# Patient Record
Sex: Female | Born: 1960
Health system: Southern US, Community
[De-identification: ages and names within clinical notes are randomized; demographics above are authoritative.]

## PROBLEM LIST (undated history)

## (undated) DIAGNOSIS — I447 Left bundle-branch block, unspecified: Secondary | ICD-10-CM

## (undated) DIAGNOSIS — M778 Other enthesopathies, not elsewhere classified: Secondary | ICD-10-CM

## (undated) DIAGNOSIS — G4730 Sleep apnea, unspecified: Secondary | ICD-10-CM

## (undated) DIAGNOSIS — G4733 Obstructive sleep apnea (adult) (pediatric): Secondary | ICD-10-CM

## (undated) DIAGNOSIS — M1990 Unspecified osteoarthritis, unspecified site: Secondary | ICD-10-CM

## (undated) DIAGNOSIS — J189 Pneumonia, unspecified organism: Secondary | ICD-10-CM

## (undated) DIAGNOSIS — K5792 Diverticulitis of intestine, part unspecified, without perforation or abscess without bleeding: Secondary | ICD-10-CM

## (undated) DIAGNOSIS — K449 Diaphragmatic hernia without obstruction or gangrene: Secondary | ICD-10-CM

## (undated) DIAGNOSIS — R053 Chronic cough: Secondary | ICD-10-CM

## (undated) DIAGNOSIS — R569 Unspecified convulsions: Secondary | ICD-10-CM

## (undated) DIAGNOSIS — F5104 Psychophysiologic insomnia: Secondary | ICD-10-CM

## (undated) DIAGNOSIS — Z950 Presence of cardiac pacemaker: Secondary | ICD-10-CM

## (undated) DIAGNOSIS — F410 Panic disorder [episodic paroxysmal anxiety] without agoraphobia: Secondary | ICD-10-CM

## (undated) DIAGNOSIS — M109 Gout, unspecified: Secondary | ICD-10-CM

## (undated) DIAGNOSIS — K219 Gastro-esophageal reflux disease without esophagitis: Secondary | ICD-10-CM

## (undated) DIAGNOSIS — I509 Heart failure, unspecified: Secondary | ICD-10-CM

## (undated) DIAGNOSIS — I10 Essential (primary) hypertension: Secondary | ICD-10-CM

## (undated) DIAGNOSIS — Z9989 Dependence on other enabling machines and devices: Secondary | ICD-10-CM

## (undated) HISTORY — PX: SKIN CANCER EXCISION: SHX779

## (undated) HISTORY — DX: Presence of cardiac pacemaker: Z95.0

## (undated) HISTORY — PX: WISDOM TOOTH EXTRACTION: SHX21

## (undated) HISTORY — PX: POLYPECTOMY: SHX149

## (undated) HISTORY — PX: UPPER GI ENDOSCOPY: SHX6162

## (undated) HISTORY — PX: TUBAL LIGATION: SHX77

## (undated) HISTORY — DX: Other enthesopathies, not elsewhere classified: M77.8

## (undated) HISTORY — DX: Obstructive sleep apnea (adult) (pediatric): G47.33

## (undated) HISTORY — PX: COLONOSCOPY: SHX174

## (undated) HISTORY — DX: Sleep apnea, unspecified: G47.30

## (undated) HISTORY — DX: Psychophysiologic insomnia: F51.04

## (undated) HISTORY — DX: Dependence on other enabling machines and devices: Z99.89

## (undated) HISTORY — PX: HAND SURGERY: SHX662

## (undated) HISTORY — DX: Panic disorder (episodic paroxysmal anxiety): F41.0

## (undated) NOTE — *Deleted (*Deleted)
  Physical Exam  BP (!) 182/89   Pulse 93   Temp 97.7 F (36.5 C) (Oral)   Resp 16   LMP  (LMP Unknown)   SpO2 97%   Physical Exam  ED Course/Procedures   Clinical Course as of Mar 24 2026  Sun Mar 24, 2020  1717 WBC(!): 21.1 [MV]  1836 Discussed case with ED Pharmacist Scott who recommends 7 days of Bactrim DS BID given pt's allergies   [MV]  1920 Lactic Acid, Venous(!!): 2.1 [MV]    Clinical Course User Index [MV] Tanda Rockers, PA-C    Procedures  MDM  ***   Repeat lactic  Tolerating PO DIscharge    Vitals:   03/24/20 1439 03/24/20 1822 03/24/20 1900 03/24/20 1954  BP: (!) 190/110 (!) 203/99 (!) 206/109 (!) 182/89  Pulse: 84 (!) 103 (!) 101 93  Resp: 16 16    Temp: 97.7 F (36.5 C)     TempSrc: Oral     SpO2: 97% 97% 94% 97%

---

## 1997-11-07 ENCOUNTER — Other Ambulatory Visit: Admission: RE | Admit: 1997-11-07 | Discharge: 1997-11-07 | Payer: Self-pay | Admitting: Obstetrics and Gynecology

## 1998-02-02 ENCOUNTER — Emergency Department (HOSPITAL_COMMUNITY): Admission: EM | Admit: 1998-02-02 | Discharge: 1998-02-02 | Payer: Self-pay | Admitting: Emergency Medicine

## 1998-02-07 ENCOUNTER — Other Ambulatory Visit: Admission: RE | Admit: 1998-02-07 | Discharge: 1998-02-07 | Payer: Self-pay | Admitting: Internal Medicine

## 1998-02-08 ENCOUNTER — Encounter: Payer: Self-pay | Admitting: Obstetrics & Gynecology

## 1998-02-08 ENCOUNTER — Inpatient Hospital Stay (HOSPITAL_COMMUNITY): Admission: EM | Admit: 1998-02-08 | Discharge: 1998-02-10 | Payer: Self-pay | Admitting: Emergency Medicine

## 1998-02-12 ENCOUNTER — Encounter: Payer: Self-pay | Admitting: Internal Medicine

## 1998-02-12 ENCOUNTER — Inpatient Hospital Stay (HOSPITAL_COMMUNITY): Admission: EM | Admit: 1998-02-12 | Discharge: 1998-02-15 | Payer: Self-pay | Admitting: Emergency Medicine

## 1998-02-14 ENCOUNTER — Encounter: Payer: Self-pay | Admitting: Surgery

## 1998-11-19 ENCOUNTER — Other Ambulatory Visit: Admission: RE | Admit: 1998-11-19 | Discharge: 1998-11-19 | Payer: Self-pay | Admitting: Obstetrics and Gynecology

## 1999-11-20 ENCOUNTER — Other Ambulatory Visit: Admission: RE | Admit: 1999-11-20 | Discharge: 1999-11-20 | Payer: Self-pay | Admitting: Obstetrics and Gynecology

## 2003-11-09 ENCOUNTER — Other Ambulatory Visit: Admission: RE | Admit: 2003-11-09 | Discharge: 2003-11-09 | Payer: Self-pay | Admitting: Obstetrics and Gynecology

## 2005-02-19 ENCOUNTER — Other Ambulatory Visit: Admission: RE | Admit: 2005-02-19 | Discharge: 2005-02-19 | Payer: Self-pay | Admitting: Obstetrics and Gynecology

## 2006-05-24 ENCOUNTER — Ambulatory Visit: Payer: Self-pay | Admitting: Cardiology

## 2006-06-04 ENCOUNTER — Ambulatory Visit: Payer: Self-pay

## 2006-06-04 ENCOUNTER — Ambulatory Visit: Payer: Self-pay | Admitting: Cardiology

## 2006-06-22 ENCOUNTER — Ambulatory Visit: Payer: Self-pay | Admitting: Cardiology

## 2006-06-24 ENCOUNTER — Ambulatory Visit: Payer: Self-pay | Admitting: Cardiology

## 2006-07-30 ENCOUNTER — Ambulatory Visit: Payer: Self-pay | Admitting: Cardiology

## 2012-01-20 DIAGNOSIS — K219 Gastro-esophageal reflux disease without esophagitis: Secondary | ICD-10-CM | POA: Insufficient documentation

## 2012-01-20 DIAGNOSIS — M25519 Pain in unspecified shoulder: Secondary | ICD-10-CM | POA: Insufficient documentation

## 2012-01-20 DIAGNOSIS — F17200 Nicotine dependence, unspecified, uncomplicated: Secondary | ICD-10-CM | POA: Insufficient documentation

## 2012-01-20 DIAGNOSIS — R569 Unspecified convulsions: Secondary | ICD-10-CM | POA: Insufficient documentation

## 2012-01-20 DIAGNOSIS — Z8719 Personal history of other diseases of the digestive system: Secondary | ICD-10-CM | POA: Insufficient documentation

## 2012-01-20 DIAGNOSIS — R55 Syncope and collapse: Secondary | ICD-10-CM | POA: Insufficient documentation

## 2012-01-20 DIAGNOSIS — M79609 Pain in unspecified limb: Secondary | ICD-10-CM | POA: Insufficient documentation

## 2012-01-21 ENCOUNTER — Emergency Department (HOSPITAL_COMMUNITY): Payer: Self-pay

## 2012-01-21 ENCOUNTER — Emergency Department (HOSPITAL_COMMUNITY)
Admission: EM | Admit: 2012-01-21 | Discharge: 2012-01-21 | Disposition: A | Discharge status: Home / Self Care | Payer: Self-pay | Attending: Emergency Medicine | Admitting: Emergency Medicine

## 2012-01-21 ENCOUNTER — Encounter (HOSPITAL_COMMUNITY): Payer: Self-pay | Admitting: Emergency Medicine

## 2012-01-21 DIAGNOSIS — I951 Orthostatic hypotension: Secondary | ICD-10-CM

## 2012-01-21 HISTORY — DX: Diaphragmatic hernia without obstruction or gangrene: K44.9

## 2012-01-21 HISTORY — DX: Diverticulitis of intestine, part unspecified, without perforation or abscess without bleeding: K57.92

## 2012-01-21 HISTORY — DX: Unspecified convulsions: R56.9

## 2012-01-21 HISTORY — DX: Gastro-esophageal reflux disease without esophagitis: K21.9

## 2012-01-21 LAB — BASIC METABOLIC PANEL
BUN: 10 mg/dL (ref 6–23)
CO2: 25 mEq/L (ref 19–32)
Glucose, Bld: 101 mg/dL — ABNORMAL HIGH (ref 70–99)
Potassium: 4 mEq/L (ref 3.5–5.1)
Sodium: 138 mEq/L (ref 135–145)

## 2012-01-21 LAB — CBC
HCT: 46.3 % — ABNORMAL HIGH (ref 36.0–46.0)
Hemoglobin: 16.2 g/dL — ABNORMAL HIGH (ref 12.0–15.0)
RBC: 5.22 MIL/uL — ABNORMAL HIGH (ref 3.87–5.11)

## 2012-01-21 NOTE — ED Provider Notes (Signed)
History     CSN: [id number]  Arrival date & time 01/20/12  2347   First MD Initiated Contact with Patient 01/21/12 0206      Chief Complaint  Patient presents with  . Seizures     HPI The patient reports that since 2008 she's had intermittent episodes where she becomes lightheaded and will sometimes have a syncopal episode followed by what sounds like myoclonic jerks.  There is concern by family that she may be having seizures.  Into does make patient a large workup by both neurology and cardiology that included tilt table testing EEGs and MRIs.  There is no known cause at this time.  She went from 2000 01/15/2010 before she had another episode.  And so far in 2013 she she's had about 4 episodes in the past month and half.  Description of the event a week and a half ago sounds to be that the patient was staring and appeared to be having an absence seizure.  The patient remembers hearing her sister talk to her but she states she was unable to move.  There is no loss consciousness at that time.  The episode today that brought the patient in today is described as the patient standing near a counter and then stating "oh shit" clear handout on the table and then falling over with twitching of her arms and legs that lasted about 1 minute.  There is no postictal period as the patient regained normal consciousness.  There is no tongue biting or urinary incontinence.  She has no headache at this time.  She denies weakness of her upper lower extremities.  She's had no chest pain shortness of breath.  She denies fevers or chills.  She said no abdominal pain nausea or vomiting.  She does not have a primary care physician and does not have insurance.  She's never been on anti-seizure medicine.    Past Medical History  Diagnosis Date  . Seizures   . Diverticulitis   . GERD (gastroesophageal reflux disease)   . Hiatal hernia     Past Surgical History  Procedure Date  . Tubal ligation   . Hand surgery       No family history on file.  History  Substance Use Topics  . Smoking status: Current Everyday Smoker  . Smokeless tobacco: Not on file  . Alcohol Use: No    OB History    Grav Para Term Preterm Abortions TAB SAB Ect Mult Living                  Review of Systems  All other systems reviewed and are negative.    Allergies  Aspirin; Ciprofloxacin; Codeine; and Penicillins  Home Medications   Current Outpatient Rx  Name Route Sig Dispense Refill  . ADULT MULTIVITAMIN W/MINERALS CH Oral Take 1 tablet by mouth daily.    Marland Kitchen OMEPRAZOLE 20 MG PO CPDR Oral Take 20 mg by mouth at bedtime.    Marland Kitchen OVER THE COUNTER MEDICATION Oral Take 1 tablet by mouth daily. OTC. Hot flash medication. Patient unsure of the name.      BP 135/77  Pulse 110  Temp 98.4 F (36.9 C) (Oral)  Resp 16  SpO2 97%  Physical Exam  Nursing note and vitals reviewed. Constitutional: She is oriented to person, place, and time. She appears well-developed and well-nourished. No distress.  HENT:  Head: Normocephalic and atraumatic.  Eyes: EOM are normal. Pupils are equal, round, and reactive to light.  Neck: Normal range of motion.  Cardiovascular: Normal rate, regular rhythm and normal heart sounds.   Pulmonary/Chest: Effort normal and breath sounds normal.  Abdominal: Soft. She exhibits no distension. There is no tenderness.  Musculoskeletal: Normal range of motion.       Mild tenderness of left great toe without swelling or ecchymosis.  Mild tenderness of right lateral shoulder without deformity or ecchymosis.  Neurological: She is alert and oriented to person, place, and time.       5/5 strength in major muscle groups of  bilateral upper and lower extremities. Speech normal. No facial asymetry.  Gait normal. Walked in the hall  Skin: Skin is warm and dry.  Psychiatric: She has a normal mood and affect. Judgment normal.    ED Course  Procedures (including critical care time)  Labs Reviewed  CBC -  Abnormal; Notable for the following:    RBC 5.22 (*)     Hemoglobin 16.2 (*)     HCT 46.3 (*)     All other components within normal limits  BASIC METABOLIC PANEL - Abnormal; Notable for the following:    Glucose, Bld 101 (*)     All other components within normal limits   Dg Shoulder Right  01/21/2012  *RADIOLOGY REPORT*  Clinical Data: Seizure.  Fall.  Shoulder pain.  RIGHT SHOULDER - 2+ VIEW  Comparison: None.  Findings: The scapular Y view is oblique.  The humeral head does appear located over the glenoid without convincing evidence of posterior dislocation.  Internal and external rotation views appear normal.  Visualized right chest normal.  AC joint normal.  IMPRESSION: No acute osseous abnormality.  Oblique scapular Y view.  Original Report Authenticated By: Andreas Newport, M.D.   Ct Head Wo Contrast  01/21/2012  *RADIOLOGY REPORT*  Clinical Data: Weakness seizure with fall.  Head trauma.  CT HEAD WITHOUT CONTRAST  Technique:  Contiguous axial images were obtained from the base of the skull through the vertex without contrast.  Comparison: None.  Findings: No mass lesion, mass effect, midline shift, hydrocephalus, hemorrhage.  No territorial ischemia or acute infarction.  Paranasal sinuses appear within normal limits.  IMPRESSION: Negative CT head.  Original Report Authenticated By: Andreas Newport, M.D.   Dg Foot Complete Left  01/21/2012  *RADIOLOGY REPORT*  Clinical Data: Seizure.  Great toe pain.  LEFT FOOT - COMPLETE 3+ VIEW  Comparison: None.  Findings: Anatomic alignment.  There is no displaced fracture identified.  Mild first MTP joint osteoarthritis. Small calcaneal spurs incidentally noted.  IMPRESSION: No acute osseous abnormality.  Original Report Authenticated By: Andreas Newport, M.D.     1. Orthostatic syncope       MDM  The patient's symptoms sound more like she develops near syncope orthostasis and then has myoclonic jerks when she was on the ground.  This is been  recurrent for years and she has had cardiac neurologic and tilt table testing without a definitive cause.  I don't believe this to be seizures.  She's had no tongue biting or urinary incontinence.  She has no postictal period she looks well the emergency department.  Labs CT head are normal.  She ambulates in the hall without any difficulty.  Discharge home.  She'll need a primary care physician.  I don't think that she needs cardiac or neurology subspecialty evaluation at this time given her extensive workup in the past.        Lyanne Co, MD 01/21/12 [phone]

## 2012-01-21 NOTE — ED Notes (Addendum)
Pt presents with witnessed seizure @ 2330 pt fell form chair at desk onto floor with episode of shaking. Denies tongue trauma, denies urinary incontinence. Pt A & O in triage. Pt c/o pain to R shoulder and L great toe, pt not currently taking anti-seizure medication

## 2013-01-24 ENCOUNTER — Telehealth: Payer: Self-pay | Admitting: Cardiology

## 2013-01-24 NOTE — Telephone Encounter (Signed)
Request Rec From [employer] asking For Records on this Pt. Her Chart is currently in the Warehouse, I sent Cover sheet back along w/ Release letting them know the  Status of the Chart & also if the needed chart to Respond back to Me or call. 01/24/13/KM

## 2013-08-22 ENCOUNTER — Other Ambulatory Visit: Payer: Self-pay | Admitting: Family Medicine

## 2013-08-22 DIAGNOSIS — G40909 Epilepsy, unspecified, not intractable, without status epilepticus: Secondary | ICD-10-CM

## 2013-08-24 ENCOUNTER — Ambulatory Visit
Admission: RE | Admit: 2013-08-24 | Discharge: 2013-08-24 | Disposition: A | Discharge status: Home / Self Care | Payer: BC Managed Care – PPO | Source: Ambulatory Visit | Attending: Family Medicine | Admitting: Family Medicine

## 2013-08-24 DIAGNOSIS — G40909 Epilepsy, unspecified, not intractable, without status epilepticus: Secondary | ICD-10-CM

## 2013-08-24 MED ORDER — GADOBENATE DIMEGLUMINE 529 MG/ML IV SOLN
17.0000 mL | Freq: Once | INTRAVENOUS | Status: AC | PRN
  Administered 2013-08-24: 17 mL via INTRAVENOUS

## 2013-08-25 ENCOUNTER — Encounter: Payer: Self-pay | Admitting: Neurology

## 2013-08-28 ENCOUNTER — Ambulatory Visit (INDEPENDENT_AMBULATORY_CARE_PROVIDER_SITE_OTHER): Payer: BC Managed Care – PPO | Admitting: Neurology

## 2013-08-28 ENCOUNTER — Encounter: Payer: Self-pay | Admitting: Neurology

## 2013-08-28 VITALS — BP 134/77 | HR 92 | Ht 62.25 in | Wt 180.0 lb

## 2013-08-28 DIAGNOSIS — R4182 Altered mental status, unspecified: Secondary | ICD-10-CM

## 2013-08-28 NOTE — Patient Instructions (Signed)
Overall you are doing fairly well but I do want to suggest a few things today:   Remember to drink plenty of fluid, eat healthy meals and do not skip any meals. Try to eat protein with a every meal and eat a healthy snack such as fruit or nuts in between meals. Try to keep a regular sleep-wake schedule and try to exercise daily, particularly in the form of walking, 20-30 minutes a day, if you can.   As far as your medications are concerned, I would like to suggest you continue on the keppra twice a day as prescribed  As far as diagnostic testing:  1)Please schedule an EEG when you check out  I would like to see you back in 6 months.  Please call us with any interim questions, concerns, problems, updates or refill requests.   My clinical assistant and will answer any of your questions and relay your messages to me and also relay most of my messages to you.   Our phone number is [phone]. We also have an after hours call service for urgent matters and there is a physician on-call for urgent questions. For any emergencies you know to call 911 or go to the nearest emergency room

## 2013-08-28 NOTE — Progress Notes (Signed)
[facility]    Provider:  Dr Janann Colonel Referring Provider: No ref. provider found Primary Care Physician:  Milagros Evener, MD  CC:  Seizure disorder  HPI:  [name] [name] is a 53 y.o. female here as a referral from Dr. Zadie Rhine for question of seizure  Patient recalls getting into the car around 1 week ago, felt "strange", felt like she was going to pass out, no abnormal sounds or smells. Felt shaking all over, her sister noted shaking of all 4 extremities. Patient notes that she was awake during the whole episode, could hear her sister talking, recalls shaking all over, was unable to talk though. Lasted around 1 minute, quickly snapped out of it and was back to her normal self immediately. No tongue biting, no loss of bowel/bladder. Has had episodes similar to this in the past. Has had extensive cardiac and neurologic workup in the past, has been told everything was normal. She does report a lot of stress related to taking care of her mother with late state AD.   Per PCP notes: Patient was sitting in her car, noticed her right foot began tapping, hands started to move, grabbed hold of the door. Patient recalls her sister calling her and was aware of what was happening but unable to answer. Denies any flashing lights, or vision changes. Lasted around 1 minute, then patient stated, "I am ok". Had 2 similar episodes last year. Patient is not driving. Past history pertinent for seizure vs syncope spells since 2008. Evaluated by neurologist in 2008, had EEG, told nothing was wrong. Recent mri brain done showed the following: 1. No acute intracranial abnormality.  2. 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 3. Nonspecific cerebral white changes.   Review of Systems: Out of a complete 14 system review, the patient complains of only the following symptoms, and all  other reviewed systems are negative. + dizziness, seizures  History   Social History  . Marital Status: Single    Spouse Name: N/A    Number of Children: N/A  . Years of Education: N/A   Occupational History  . Not on file.   Social History Main Topics  . Smoking status: Current Every Day Smoker  . Smokeless tobacco: Not on file  . Alcohol Use: No     Comment: Quit 12/06/2009  . Drug Use: No  . Sexual Activity: Not on file   Other Topics Concern  . Not on file   Social History Narrative   2-3 sodas daily   Lives with sister   Some college   Left handed    No family history on file.  Past Medical History  Diagnosis Date  . Seizures   . Diverticulitis   . GERD (gastroesophageal reflux disease)   . Hiatal hernia     Past Surgical History  Procedure Laterality Date  . Tubal ligation    . Hand surgery      Current Outpatient Prescriptions  Medication Sig Dispense Refill  . levETIRAcetam (KEPPRA) 500 MG tablet       . Multiple Vitamin (MULTIVITAMIN WITH MINERALS) TABS Take 1 tablet by mouth daily.      Marland Kitchen omeprazole (PRILOSEC) 20 MG capsule Take 20 mg by mouth at bedtime.      Marland Kitchen OVER THE COUNTER MEDICATION Take 1 tablet by mouth daily. OTC. Hot flash medication. Patient unsure of the name.  No current facility-administered medications for this visit.    Allergies as of 08/28/2013 - Review Complete 08/28/2013  Allergen Reaction Noted  . Aspirin  01/21/2012  . Ciprofloxacin  01/21/2012  . Codeine Hives 01/21/2012  . Penicillins Hives 01/21/2012    Vitals: BP 134/77  Pulse 92  Ht 5' 2.25" (1.581 m)  Wt 180 lb (81.647 kg)  BMI 32.66 kg/m2 Last Weight:  Wt Readings from Last 1 Encounters:  08/28/13 180 lb (81.647 kg)   Last Height:   Ht Readings from Last 1 Encounters:  08/28/13 5' 2.25" (1.581 m)     Physical exam: Exam: Gen: NAD, conversant Eyes: anicteric sclerae, moist conjunctivae HENT: Atraumatic, oropharynx clear Neck: Trachea  midline; supple,  Lungs: CTA, no wheezing, rales, rhonic                          CV: RRR, no MRG Abdomen: Soft, non-tender;  Extremities: No peripheral edema  Skin: Normal temperature, no rash,  Psych: Appropriate affect, pleasant  Neuro: MS: AA&Ox3, appropriately interactive, normal affect   Speech: fluent w/o paraphasic error  Memory: good recent and remote recall  CN: PERRL, EOMI no nystagmus, no ptosis, sensation intact to LT V1-V3 bilat, face symmetric, no weakness, hearing grossly intact, palate elevates symmetrically, shoulder shrug 5/5 bilat,  tongue protrudes midline, no fasiculations noted.  Motor: normal bulk and tone Strength: 5/5  In all extremities  Coord: rapid alternating and point-to-point (FNF, HTS) movements intact.  Reflexes: symmetrical, bilat downgoing toes  Sens: LT intact in all extremities  Gait: posture, stance, stride and arm-swing normal. Tandem gait intact. Able to walk on heels and toes. Romberg absent.   Assessment:  After physical and neurologic examination, review of laboratory studies, imaging, neurophysiology testing and pre-existing records, assessment will be reviewed on the problem list.  Plan:  Treatment plan and additional workup will be reviewed under Problem List.  1)Altered mental status  53y/o woman presenting for initial evaluation of periods of altered mental status with question of seizure activity. Patient notes generalized shaking for around 1 minute with quick return to baseline, of note she describes being alert and awake for the whole episode but unable to talk. Due to generalized shaking with no LOC these events would be atypical for seizures but the differential would include seizure vs syncope. Non-epileptic seizure would also be on the differential. MRI did show ? Right mesial temporal changes which could be potential seizure foci. Will check EEG, continue on keppra 500mg  BID. Counseled patient to avoid driving for 6  months, will follow up in 6 months. If EEG unremarkable patient is cleared to return to work with limitation of no driving.    Jim Like, DO  [facility] [address] [place], [place] [address]  Phone [phone] Fax [phone]

## 2013-08-30 ENCOUNTER — Ambulatory Visit (INDEPENDENT_AMBULATORY_CARE_PROVIDER_SITE_OTHER): Payer: BC Managed Care – PPO | Admitting: Radiology

## 2013-08-30 ENCOUNTER — Telehealth: Payer: Self-pay | Admitting: Neurology

## 2013-08-30 DIAGNOSIS — R4182 Altered mental status, unspecified: Secondary | ICD-10-CM

## 2013-08-30 NOTE — Telephone Encounter (Signed)
Patient at check out today states that she will need a letter from [name] releasing her to go back to work, please call and advise patient.

## 2013-08-30 NOTE — Telephone Encounter (Signed)
Please let her know we are waiting for the EEG results. Thanks.

## 2013-08-30 NOTE — Procedures (Signed)
      Jahayra Mazo is a 53 year old patient with episodes of altered mental status associated with trembling, feeling strange. The patient has not blacked out and has some recollection of what goes on during the events. The patient is being evaluated for possible seizures.  This is a routine EEG. No skull defects are noted. Medications include Keppra, multivitamins, and Prilosec.  EEG classification: Dysrhythmia grade 1 left temporal  Description of the recording: The background rhythms of this recording consists of a moderately well modulated medium amplitude rhythm of 11 Hz that is reactive to eye opening and closure. As the record progresses, photic stimulation is performed, and results in a bilateral and symmetric photic driving response. Hyperventilation is also performed, resulting in a minimal buildup of the background rhythm activities without significant slowing seen. Intermittently during the recording, mild brief 6-7 Hz theta frequency slowing is seen emanating from the left temporal region. Similar slowing is not seen on the right side. At no time during the recording does there appear to be evidence of actual spike or spike or discharges. EKG monitor shows no evidence of cardiac rhythm abnormalities with a heart rate of 72.  Impression: This is a borderline EEG recording secondary to brief episodes of theta slowing emanating from the left temporal region. This study suggests a mild left brain abnormality, without clear evidence of epileptiform activity. Clinical correlation is required. No electrographic seizures were recorded.

## 2013-08-30 NOTE — Telephone Encounter (Signed)
Spoke with patient and informed her that once we receive the results of her EEG, [name] will review them and decide if she will be released to go back to work. Patient expressed understanding no further questions or concerns.

## 2013-08-30 NOTE — Telephone Encounter (Signed)
Pt stating that she is going to need a letter releasing her to go back to work. Please advise

## 2013-08-31 ENCOUNTER — Encounter: Payer: Self-pay | Admitting: Neurology

## 2013-08-31 NOTE — Progress Notes (Signed)
Quick Note:  Spoke with patient, she will pick the letter up tomorrow, patient states that her Keppra medication states no refills and that if she is going to be on this medication for a while that she needs more refills, her pharmacy is [facility] on [address]. ______

## 2013-09-01 ENCOUNTER — Other Ambulatory Visit: Payer: Self-pay | Admitting: Neurology

## 2013-09-01 MED ORDER — LEVETIRACETAM 500 MG PO TABS: 500.0000 mg | ORAL_TABLET | Freq: Two times a day (BID) | ORAL | Status: DC

## 2013-09-19 ENCOUNTER — Telehealth: Payer: Self-pay | Admitting: Neurology

## 2013-09-19 NOTE — Telephone Encounter (Signed)
Left message for patient that the doctor feels the patient may work, and does not need FMLA time.  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  I asked her to call office and get reimbursed her fee.

## 2013-10-20 ENCOUNTER — Telehealth: Payer: Self-pay | Admitting: *Deleted

## 2013-10-20 NOTE — Telephone Encounter (Signed)
Patient returning your call about FMLA

## 2013-10-23 ENCOUNTER — Telehealth: Payer: Self-pay | Admitting: *Deleted

## 2013-10-23 ENCOUNTER — Telehealth: Payer: Self-pay | Admitting: Neurology

## 2013-10-23 NOTE — Telephone Encounter (Signed)
I didn't call the patient about an FMLA form, and do not have one for the patient.  Please check into this.

## 2013-10-23 NOTE — Telephone Encounter (Signed)
Need FMLA form to be filled out,because she will lose her job if it's nodone

## 2013-10-23 NOTE — Telephone Encounter (Signed)
Patient returning Donna's call, will be at number listed until 5pm today.

## 2013-10-24 NOTE — Telephone Encounter (Signed)
Spoke to patient and she relayed that her company requires FMLA to be completed if an employee misses more than 3 days of work.  This was not originally communicated to me.  I told her I would complete the form for the days she was out of work March 17-27th and relay this information to the doctor, so he will approve.

## 2013-10-24 NOTE — Telephone Encounter (Signed)
Thanks for taking care of this.

## 2013-10-26 NOTE — Telephone Encounter (Signed)
Form completed and faxed to [employer] today, confirmation received.  Spoke to patient to relay form was completed and faxed.

## 2013-10-31 DIAGNOSIS — Z0289 Encounter for other administrative examinations: Secondary | ICD-10-CM

## 2013-11-02 ENCOUNTER — Telehealth: Payer: Self-pay | Admitting: Neurology

## 2013-11-02 NOTE — Telephone Encounter (Signed)
Patient requesting a return call from Byron.

## 2013-11-03 NOTE — Telephone Encounter (Signed)
Called yesterday and didn't get a returned call.  Had a seizure the day before yesterday.   Made her sleep most of the day.   Call her at [phone]

## 2013-11-06 ENCOUNTER — Other Ambulatory Visit: Payer: Self-pay | Admitting: Neurology

## 2013-11-06 MED ORDER — LEVETIRACETAM 750 MG PO TABS: 750.0000 mg | ORAL_TABLET | Freq: Two times a day (BID) | ORAL | Status: DC

## 2013-11-06 NOTE — Telephone Encounter (Signed)
Spoke with patient informed her to increase the Keppra to 750 mg twice a day, patient states that she just needed documentation stating that when she has a seizure that it will make her very lethargic but she will be fine the next day.

## 2013-11-06 NOTE — Telephone Encounter (Signed)
Please let her know we will increase her Keppra to 750mg  twice a day. We will plan to work with her FMLA paperwork to see if we can get her days off through that. Butch Penny is working on this).

## 2013-11-06 NOTE — Telephone Encounter (Signed)
Spoke to patient and she had a seizure on Thursday morning, she laid back down and slept the rest of the day.  She returned to work on Friday and felt fine.  Would you like to see patient sooner than September appointment.  Also patient is needing a note for being out of work for Thursday.

## 2013-11-09 ENCOUNTER — Telehealth: Payer: Self-pay | Admitting: Neurology

## 2013-11-09 NOTE — Telephone Encounter (Signed)
Spoke to patient and asked her to contact her HR department to see if we can addend the FMLA form instead of writing a note every time she is out of work for a seizure event.  They have agreed to our office changing the FMLA, to intermittent family leave.

## 2013-11-09 NOTE — Telephone Encounter (Signed)
Patient wanting [name] [name] to know it's OK to Admend FMLA paperwork.

## 2013-11-09 NOTE — Telephone Encounter (Signed)
Patient's HR department has agreed for [place] to addend FMLA to accommodate patient's seizure events.

## 2013-12-06 ENCOUNTER — Encounter (HOSPITAL_BASED_OUTPATIENT_CLINIC_OR_DEPARTMENT_OTHER): Payer: Self-pay | Admitting: Emergency Medicine

## 2013-12-06 ENCOUNTER — Emergency Department (HOSPITAL_BASED_OUTPATIENT_CLINIC_OR_DEPARTMENT_OTHER)
Admission: EM | Admit: 2013-12-06 | Discharge: 2013-12-06 | Disposition: A | Discharge status: Home / Self Care | Payer: BC Managed Care – PPO | Attending: Emergency Medicine | Admitting: Emergency Medicine

## 2013-12-06 ENCOUNTER — Telehealth: Payer: Self-pay | Admitting: *Deleted

## 2013-12-06 ENCOUNTER — Other Ambulatory Visit: Payer: Self-pay | Admitting: Neurology

## 2013-12-06 DIAGNOSIS — G40909 Epilepsy, unspecified, not intractable, without status epilepticus: Secondary | ICD-10-CM | POA: Insufficient documentation

## 2013-12-06 DIAGNOSIS — Z880 Allergy status to penicillin: Secondary | ICD-10-CM | POA: Insufficient documentation

## 2013-12-06 DIAGNOSIS — F17200 Nicotine dependence, unspecified, uncomplicated: Secondary | ICD-10-CM | POA: Insufficient documentation

## 2013-12-06 DIAGNOSIS — Z8719 Personal history of other diseases of the digestive system: Secondary | ICD-10-CM | POA: Insufficient documentation

## 2013-12-06 DIAGNOSIS — Z79899 Other long term (current) drug therapy: Secondary | ICD-10-CM | POA: Insufficient documentation

## 2013-12-06 DIAGNOSIS — R569 Unspecified convulsions: Secondary | ICD-10-CM

## 2013-12-06 MED ORDER — LEVETIRACETAM 1000 MG PO TABS: 1000.0000 mg | ORAL_TABLET | Freq: Two times a day (BID) | ORAL | Status: DC

## 2013-12-06 MED ORDER — ONDANSETRON 4 MG PO TBDP: 4.0000 mg | ORAL_TABLET | Freq: Three times a day (TID) | ORAL | Status: DC | PRN

## 2013-12-06 MED ORDER — SODIUM CHLORIDE 0.9 % IV SOLN: 1000.0000 mg | Freq: Once | INTRAVENOUS | Status: DC

## 2013-12-06 MED ORDER — LEVETIRACETAM 500 MG/5ML IV SOLN
INTRAVENOUS | Status: AC
  Administered 2013-12-06: 1000 mg via INTRAVENOUS
  Filled 2013-12-06: qty 10

## 2013-12-06 NOTE — ED Notes (Signed)
MD at bedside. 

## 2013-12-06 NOTE — Telephone Encounter (Signed)
Spoke with patient and she has not gone to ER, was nauseated for a couple of days, was prescribed Promethazine by PCP, (did not take for the last 4 days), mother passed away and was under a lot of stress. She has also been taking 3 antibiotics(Z-pak,Prednisdone,Sulfur) for 3 weeks. She was has not missed any doses of her Keppra. Was lying down, woke up and had a seizure.

## 2013-12-06 NOTE — Telephone Encounter (Signed)
Hi [name] [name],  Can you pleaser call and check up on her. If she has not returned to her baseline then she needs to go to the ER. If she is back to normal then let her know to increase her keppra to 1000mg  twice a day.  A prescription has been sent to her pharmacy.   Thanks.

## 2013-12-06 NOTE — ED Notes (Signed)
Pt reports unwitnessed seizure this am, HX of same, Neuro called this am and increased keppra to 1000mg  x bid.

## 2013-12-06 NOTE — Discharge Instructions (Signed)
Start your 1000 mg twice a day dose of Keppra  Seizure, Adult A seizure is abnormal electrical activity in the brain. Seizures usually last from 30 seconds to 2 minutes. There are various types of seizures. Before a seizure, you may have a warning sensation (aura) that a seizure is about to occur. An aura may include the following symptoms:   Fear or anxiety.  Nausea.  Feeling like the room is spinning (vertigo).  Vision changes, such as seeing flashing lights or spots. Common symptoms during a seizure include:  A change in attention or behavior (altered mental status).  Convulsions with rhythmic jerking movements.  Drooling.  Rapid eye movements.  Grunting.  Loss of bladder and bowel control.  Bitter taste in the mouth.  Tongue biting. After a seizure, you may feel confused and sleepy. You may also have an injury resulting from convulsions during the seizure. HOME CARE INSTRUCTIONS   If you are given medicines, take them exactly as prescribed by your health care provider.  Keep all follow-up appointments as directed by your health care provider.  Do not swim or drive or engage in risky activity during which a seizure could cause further injury to you or others until your health care provider says it is OK.  Get adequate rest.  Teach friends and family what to do if you have a seizure. They should:  Lay you on the ground to prevent a fall.  Put a cushion under your head.  Loosen any tight clothing around your neck.  Turn you on your side. If vomiting occurs, this helps keep your airway clear.  Stay with you until you recover.  Know whether or not you need emergency care. SEEK IMMEDIATE MEDICAL CARE IF:  The seizure lasts longer than 5 minutes.  The seizure is severe or you do not wake up immediately after the seizure.  You have an altered mental status after the seizure.  You are having more frequent or worsening seizures. Someone should drive you to the  emergency department or call local emergency services (911 in [place] MAKE SURE YOU:  Understand these instructions.  Will watch your condition.  Will get help right away if you are not doing well or get worse. Document Released: 05/22/2000 Document Revised: 03/15/2013 Document Reviewed: 01/04/2013 [facility] Patient Information 2015 [place], [place]. This information is not intended to replace advice given to you by your health care provider. Make sure you discuss any questions you have with your health care provider.

## 2013-12-06 NOTE — Telephone Encounter (Signed)
I called pt and she will start the keppra 1000mg  po bid tonight when her sister gets it for her after work.  She stated that she has been stressed due to her mother passing away one week ago, but also having been treated with sinus infection (abx and then prednisone), the had staph infection on her back. She had taken phenergan this am for nausea and was asleep on couch when she awoke around 0930 , had a 20-30 sec generalized seizure.  She was alone.  She is ok now.  She had been on keppra 750mg  po bid.  Will start the newer dose this pm.  (new prescription at pharm).  She will call back as needed.

## 2013-12-06 NOTE — ED Provider Notes (Signed)
CSN: [id number]     Arrival date & time 12/06/13  0865 History  This chart was scribed for [name] Furry, MD by Ludger Nutting, ED Scribe. This patient was seen in room MH01/MH01 and the patient's care was started 7:40 PM.    Chief Complaint  Patient presents with  . Seizures      The history is provided by the patient. No language interpreter was used.    HPI Comments: [name] [name] is a 53 y.o. female with past medical history of seizures who presents to the Emergency Department complaining of a seizure that occurred this morning. Patient states she had her first seizure 5 years ago and has had 3 since March 2015. Patient states she began seeing a neurologist in March at [facility] who started her on Keppra 500 mg BID. She reports having a second seizure 3 weeks ago and her Keppra was increased to 750 mg BID. She reports her dose was increased again to 1000 mg BID after she called her neurologist this morning. She states this morning she was nauseated so she took phenergan which was prescribed by her PCP. She also had associated fatigue and noticed she was shaking after the episode. She denies LOC.   PCP is [facility]   Past Medical History  Diagnosis Date  . Seizures   . Diverticulitis   . GERD (gastroesophageal reflux disease)   . Hiatal hernia    Past Surgical History  Procedure Laterality Date  . Tubal ligation    . Hand surgery     History reviewed. No pertinent family history. History  Substance Use Topics  . Smoking status: Current Every Day Smoker -- 1.00 packs/day    Types: Cigarettes  . Smokeless tobacco: Not on file  . Alcohol Use: No     Comment: Quit 12/06/2009   OB History   Grav Para Term Preterm Abortions TAB SAB Ect Mult Living                 Review of Systems  Constitutional: Negative.   HENT: Negative.   Eyes: Negative.   Respiratory: Negative.   Cardiovascular: Negative.   Gastrointestinal: Negative.   Neurological: Negative.    Psychiatric/Behavioral: Negative.   All other systems reviewed and are negative.     Allergies  Aspirin; Ciprofloxacin; Codeine; and Penicillins  Home Medications   Prior to Admission medications   Medication Sig Start Date End Date Taking? Authorizing Provider  levETIRAcetam (KEPPRA) 1000 MG tablet Take 1 tablet (1,000 mg total) by mouth 2 (two) times daily. 12/06/13   Hulen Luster, DO  Multiple Vitamin (MULTIVITAMIN WITH MINERALS) TABS Take 1 tablet by mouth daily.    Historical Provider, MD  omeprazole (PRILOSEC) 20 MG capsule Take 20 mg by mouth at bedtime.    Historical Provider, MD  ondansetron (ZOFRAN ODT) 4 MG disintegrating tablet Take 1 tablet (4 mg total) by mouth every 8 (eight) hours as needed for nausea. 12/06/13   [name] Furry, MD  OVER THE COUNTER MEDICATION Take 1 tablet by mouth daily. OTC. Hot flash medication. Patient unsure of the name.    Historical Provider, MD   BP 134/78  Pulse 112  Temp(Src) 98.4 F (36.9 C) (Oral)  Resp 16  Ht 5\' 3"  (1.6 m)  Wt 168 lb (76.204 kg)  BMI 29.77 kg/m2  SpO2 98% Physical Exam  Nursing note and vitals reviewed. Constitutional: She is oriented to person, place, and time. She appears well-developed and well-nourished.  No distress.  HENT:  Head: Normocephalic.  Eyes: Conjunctivae are normal. Pupils are equal, round, and reactive to light. No scleral icterus.  Neck: Normal range of motion. Neck supple. No thyromegaly present.  Cardiovascular: Normal rate and regular rhythm.  Exam reveals no gallop and no friction rub.   No murmur heard. Pulmonary/Chest: Effort normal and breath sounds normal. No respiratory distress. She has no wheezes. She has no rales.  Abdominal: Soft. Bowel sounds are normal. She exhibits no distension. There is no tenderness. There is no rebound.  Musculoskeletal: Normal range of motion.  Neurological: She is alert and oriented to person, place, and time.  Skin: Skin is warm and dry. No rash noted.   Small 1 cm papule with desquamation of the surrounding skin   Psychiatric: She has a normal mood and affect. Her behavior is normal.    ED Course  Procedures (including critical care time)  DIAGNOSTIC STUDIES: Oxygen Saturation is 98% on RA, normal by my interpretation.    COORDINATION OF CARE: 7:50 PM Discussed treatment plan with pt at bedside and pt agreed to plan.   Labs Review Labs Reviewed - No data to display  Imaging Review No results found.   EKG Interpretation None      MDM   Final diagnoses:  Seizures    We discussed that Phenergan may actually lower seizure threshold. Offered Zofran for nausea. Primary care followup. The area of the previous infection on her back appears healed.  I personally performed the services described in this documentation, which was scribed in my presence. The recorded information has been reviewed and is accurate.    EKG Interpretation None        [name] Furry, MD 12/06/13 2047

## 2013-12-13 ENCOUNTER — Telehealth: Payer: Self-pay | Admitting: Neurology

## 2013-12-13 ENCOUNTER — Emergency Department (HOSPITAL_BASED_OUTPATIENT_CLINIC_OR_DEPARTMENT_OTHER)
Admission: EM | Admit: 2013-12-13 | Discharge: 2013-12-13 | Disposition: A | Discharge status: Home / Self Care | Payer: BC Managed Care – PPO | Attending: Emergency Medicine | Admitting: Emergency Medicine

## 2013-12-13 ENCOUNTER — Encounter (HOSPITAL_BASED_OUTPATIENT_CLINIC_OR_DEPARTMENT_OTHER): Payer: Self-pay | Admitting: Emergency Medicine

## 2013-12-13 DIAGNOSIS — R569 Unspecified convulsions: Secondary | ICD-10-CM

## 2013-12-13 DIAGNOSIS — Z8719 Personal history of other diseases of the digestive system: Secondary | ICD-10-CM | POA: Insufficient documentation

## 2013-12-13 DIAGNOSIS — F17200 Nicotine dependence, unspecified, uncomplicated: Secondary | ICD-10-CM | POA: Insufficient documentation

## 2013-12-13 DIAGNOSIS — Z79899 Other long term (current) drug therapy: Secondary | ICD-10-CM | POA: Insufficient documentation

## 2013-12-13 DIAGNOSIS — Z880 Allergy status to penicillin: Secondary | ICD-10-CM | POA: Insufficient documentation

## 2013-12-13 DIAGNOSIS — G40909 Epilepsy, unspecified, not intractable, without status epilepticus: Secondary | ICD-10-CM | POA: Insufficient documentation

## 2013-12-13 LAB — CBC WITH DIFFERENTIAL/PLATELET
BASOS ABS: 0 10*3/uL (ref 0.0–0.1)
BASOS PCT: 0 % (ref 0–1)
Eosinophils Absolute: 0.2 10*3/uL (ref 0.0–0.7)
Eosinophils Relative: 2 % (ref 0–5)
HCT: 42 % (ref 36.0–46.0)
Hemoglobin: 14.4 g/dL (ref 12.0–15.0)
LYMPHS PCT: 39 % (ref 12–46)
Lymphs Abs: 3 10*3/uL (ref 0.7–4.0)
MCH: 30.2 pg (ref 26.0–34.0)
MCHC: 34.3 g/dL (ref 30.0–36.0)
MCV: 88.1 fL (ref 78.0–100.0)
Monocytes Absolute: 0.5 10*3/uL (ref 0.1–1.0)
Monocytes Relative: 7 % (ref 3–12)
NEUTROS ABS: 4 10*3/uL (ref 1.7–7.7)
NEUTROS PCT: 52 % (ref 43–77)
PLATELETS: 312 10*3/uL (ref 150–400)
RBC: 4.77 MIL/uL (ref 3.87–5.11)
RDW: 13 % (ref 11.5–15.5)
WBC: 7.7 10*3/uL (ref 4.0–10.5)

## 2013-12-13 LAB — BASIC METABOLIC PANEL
ANION GAP: 15 (ref 5–15)
BUN: 7 mg/dL (ref 6–23)
CHLORIDE: 104 meq/L (ref 96–112)
CO2: 23 meq/L (ref 19–32)
Calcium: 9.4 mg/dL (ref 8.4–10.5)
Creatinine, Ser: 0.9 mg/dL (ref 0.50–1.10)
GFR calc non Af Amer: 71 mL/min — ABNORMAL LOW (ref >90)
GFR, EST AFRICAN AMERICAN: 83 mL/min — AB (ref >90)
Glucose, Bld: 101 mg/dL — ABNORMAL HIGH (ref 70–99)
Potassium: 3.6 mEq/L — ABNORMAL LOW (ref 3.7–5.3)
SODIUM: 142 meq/L (ref 137–147)

## 2013-12-13 NOTE — ED Notes (Signed)
Per EMS:  Pt has witnessed seizure at work, one minute, no head injury. Pt recently diagnosed this march.

## 2013-12-13 NOTE — Telephone Encounter (Signed)
Spoke with Dr. Doy Mince about patient who was in ER.  He wanted to speak to doctor about patient's seizure medications.  Dr. Leonie Man, [facility],  spoke to Dr. Doy Mince and recommended that patient stay on current dose of Keppra and to follow up with [name].

## 2013-12-13 NOTE — ED Provider Notes (Signed)
CSN: [id number]     Arrival date & time 12/13/13  1547 History   First MD Initiated Contact with Patient 12/13/13 1557     Chief Complaint  Patient presents with  . Seizures     (Consider location/radiation/quality/duration/timing/severity/associated sxs/prior Treatment) Patient is a 53 y.o. female presenting with seizures.  Seizures Seizure activity on arrival: no   Seizure type:  Grand mal Episode characteristics: eye deviation (rolled back), generalized shaking and unresponsiveness   Postictal symptoms: confusion   Return to baseline: yes   Severity:  Moderate Duration:  1 minute Timing:  Once Progression:  Resolved Context: change in medication (keppra increased 1 week ago) and stress (Mother died recently)   Context: not alcohol withdrawal and not fever   History of seizures: yes     Past Medical History  Diagnosis Date  . Seizures   . Diverticulitis   . GERD (gastroesophageal reflux disease)   . Hiatal hernia    Past Surgical History  Procedure Laterality Date  . Tubal ligation    . Hand surgery     No family history on file. History  Substance Use Topics  . Smoking status: Current Every Day Smoker -- 1.00 packs/day    Types: Cigarettes  . Smokeless tobacco: Not on file  . Alcohol Use: No     Comment: Quit 12/06/2009   OB History   Grav Para Term Preterm Abortions TAB SAB Ect Mult Living                 Review of Systems  Constitutional: Negative for fever.  HENT: Negative for congestion.   Respiratory: Negative for cough and shortness of breath.   Cardiovascular: Negative for chest pain.  Gastrointestinal: Negative for nausea, vomiting, abdominal pain and diarrhea.  Neurological: Positive for seizures.  All other systems reviewed and are negative.     Allergies  Aspirin; Ciprofloxacin; Codeine; and Penicillins  Home Medications   Prior to Admission medications   Medication Sig Start Date End Date Taking? Authorizing Provider  Nutritional  Supplements (ESTROVEN PO) Take by mouth.   Yes Historical Provider, MD  levETIRAcetam (KEPPRA) 1000 MG tablet Take 1 tablet (1,000 mg total) by mouth 2 (two) times daily. 12/06/13   Hulen Luster, DO  Multiple Vitamin (MULTIVITAMIN WITH MINERALS) TABS Take 1 tablet by mouth daily.    Historical Provider, MD  omeprazole (PRILOSEC) 20 MG capsule Take 20 mg by mouth at bedtime.    Historical Provider, MD  ondansetron (ZOFRAN ODT) 4 MG disintegrating tablet Take 1 tablet (4 mg total) by mouth every 8 (eight) hours as needed for nausea. 12/06/13   [name], MD  OVER THE COUNTER MEDICATION Take 1 tablet by mouth daily. OTC. Hot flash medication. Patient unsure of the name.    Historical Provider, MD   BP 140/64  Pulse 84  Temp(Src) 98.2 F (36.8 C) (Oral)  Resp 18  Ht 5\' 2"  (1.575 m)  Wt 168 lb (76.204 kg)  BMI 30.72 kg/m2  SpO2 97% Physical Exam  Nursing note and vitals reviewed. Constitutional: She is oriented to person, place, and time. She appears well-developed and well-nourished. No distress.  HENT:  Head: Normocephalic and atraumatic.  Mouth/Throat: Oropharynx is clear and moist.  Eyes: Conjunctivae and EOM are normal. Pupils are equal, round, and reactive to light. No scleral icterus.  Neck: Neck supple.  Cardiovascular: Normal rate, regular rhythm, normal heart sounds and intact distal pulses.   No murmur heard. Pulmonary/Chest: Effort normal and breath  sounds normal. No stridor. No respiratory distress. She has no rales.  Abdominal: Soft. Bowel sounds are normal. She exhibits no distension. There is no tenderness.  Musculoskeletal: Normal range of motion.  Neurological: She is alert and oriented to person, place, and time. She has normal strength.  Skin: Skin is warm and dry. No rash noted.  Psychiatric: She has a normal mood and affect. Her behavior is normal.    ED Course  Procedures (including critical care time) Labs Review Labs Reviewed  BASIC METABOLIC PANEL -  Abnormal; Notable for the following:    Potassium 3.6 (*)    Glucose, Bld 101 (*)    GFR calc non Af Amer 71 (*)    GFR calc Af Amer 83 (*)    All other components within normal limits  CBC WITH DIFFERENTIAL    Imaging Review No results found.   EKG Interpretation None      MDM   Final diagnoses:  Seizure    53 yo female who reports having a seizure while at work.  Witnessed by [employer].  Seen a week ago for similar episode, at which time keppra was increased to 1000mg  BID.  Well appearing now, normal neuro exam, back to baseline.  Discussed her case with Dr. Leonie Man [facility]) who rec'd no medication changes and follow up with primary neurologist.  Pt comfortable with plan.       Houston Siren III, MD 12/13/13 [phone]

## 2013-12-13 NOTE — Discharge Instructions (Signed)

## 2013-12-14 ENCOUNTER — Telehealth: Payer: Self-pay | Admitting: Neurology

## 2013-12-14 NOTE — Telephone Encounter (Signed)
I called pt and made appt for her with [name], NP tomorrow at 1130.  Pt confirmed.

## 2013-12-14 NOTE — Telephone Encounter (Signed)
Patient calling to schedule an appointment with [name] as soon as possible, states that she had a seizure yesterday and was advised at the ER to schedule an appointment as soon as possible. Patient didn't want to wait for first available in August. Please return call to patient and advise.

## 2013-12-14 NOTE — Telephone Encounter (Signed)
Setup appt in 1-2 weeks with [name] or NP. -VRP

## 2013-12-15 ENCOUNTER — Telehealth: Payer: Self-pay | Admitting: Adult Health

## 2013-12-15 ENCOUNTER — Encounter: Payer: Self-pay | Admitting: Adult Health

## 2013-12-15 ENCOUNTER — Ambulatory Visit (INDEPENDENT_AMBULATORY_CARE_PROVIDER_SITE_OTHER): Payer: BC Managed Care – PPO | Admitting: Adult Health

## 2013-12-15 VITALS — BP 120/76 | HR 89 | Ht 62.0 in | Wt 167.6 lb

## 2013-12-15 DIAGNOSIS — R569 Unspecified convulsions: Secondary | ICD-10-CM

## 2013-12-15 NOTE — Patient Instructions (Signed)
Seizure, Adult A seizure means there is unusual activity in the brain. A seizure can cause changes in attention or behavior. Seizures often cause shaking (convulsions). Seizures often last from 30 seconds to 2 minutes. HOME CARE   If you are given medicines, take them exactly as told by your doctor.  Keep all doctor visits as told.  Do not swim or drive until your doctor says it is okay.  Teach others what to do if you have a seizure. They should:  Lay you on the ground.  Put a cushion under your head.  Loosen any tight clothing around your neck.  Turn you on your side.  Stay with you until you get better. GET HELP RIGHT AWAY IF:   The seizure lasts longer than 2 to 5 minutes.  The seizure is very bad.  The person does not wake up after the seizure.  The person's attention or behavior changes. Drive the person to the emergency room or call your local emergency services (911 in [place] MAKE SURE YOU:   Understand these instructions.  Will watch your condition.  Will get help right away if you are not doing well or get worse. Document Released: 11/11/2007 Document Revised: 08/17/2011 Document Reviewed: 05/13/2011 [employer] Patient Information 2015 [employer]. This information is not intended to replace advice given to you by your health care provider. Make sure you discuss any questions you have with your health care provider.  

## 2013-12-15 NOTE — Progress Notes (Signed)
PATIENT: [name] [name] DOB: September 14, 1960  REASON FOR VISIT: follow up HISTORY FROM: patient  HISTORY OF PRESENT ILLNESS: [name] [name] is a 53 year old female with a history of seizures. She returns today for an evaluation. Patient was increased to 1000 mg BID Keppra July 1st. The patient had another seizure Wednesday (12/13/13) with no warning while at work. Patient states that she collapsed to the ground and  was told she was convulsing in all extremities. She has some bruises but did not sustain any injuries. She was taken to the emergency room via ambulance. The patient states that she was lethargic after the fact. Patient states that her mother recently passed at the end of June.  She also had a sinus infection at the end of June. She does not operate a motor vehicle.   HISTORY 08/28/13 (PS): Patient recalls getting into the car around 1 week ago, felt "strange", felt like she was going to pass out, no abnormal sounds or smells. Felt shaking all over, her sister noted shaking of all 4 extremities. Patient notes that she was awake during the whole episode, could hear her sister talking, recalls shaking all over, was unable to talk though. Lasted around 1 minute, quickly snapped out of it and was back to her normal self immediately. No tongue biting, no loss of bowel/bladder. Has had episodes similar to this in the past. Has had extensive cardiac and neurologic workup in the past, has been told everything was normal. She does report a lot of stress related to taking care of her mother with late state AD.   REVIEW OF SYSTEMS: Full 14 system review of systems performed and notable only for:  Constitutional: N/A  Eyes: light sensitivity  Ear/Nose/Throat: N/A  Skin: N/A  Cardiovascular: N/A  Respiratory: N/A  Gastrointestinal: N/A  Genitourinary: N/A Hematology/Lymphatic: N/A  Endocrine: N/A Musculoskeletal:N/A  Allergy/Immunology: N/A  Neurological: seizure Psychiatric: N/A Sleep:  N/A   ALLERGIES: Allergies  Allergen Reactions  . Aspirin     sensitivity  . Ciprofloxacin     Seizures & it keeps her awake  . Codeine Hives  . Penicillins Hives    HOME MEDICATIONS:   PAST MEDICAL HISTORY: Past Medical History  Diagnosis Date  . Seizures   . Diverticulitis   . GERD (gastroesophageal reflux disease)   . Hiatal hernia     PAST SURGICAL HISTORY: Past Surgical History  Procedure Laterality Date  . Tubal ligation    . Hand surgery      FAMILY HISTORY: History reviewed. No pertinent family history.  SOCIAL HISTORY: History   Social History  . Marital Status: Single    Spouse Name: N/A    Number of Children: N/A  . Years of Education: N/A   Occupational History  . Not on file.   Social History Main Topics  . Smoking status: Current Every Day Smoker -- 1.00 packs/day    Types: Cigarettes  . Smokeless tobacco: Never Used  . Alcohol Use: No     Comment: Quit 12/06/2009  . Drug Use: No  . Sexual Activity: Not on file   Other Topics Concern  . Not on file   Social History Narrative   2-3 sodas daily   Lives with sister   Some college   Left handed      PHYSICAL EXAM  Filed Vitals:   12/15/13 1141  BP: 120/76  Pulse: 89  Height: 5\' 2"  (1.575 m)  Weight: 167 lb 9.6 oz (76.023  kg)   Body mass index is 30.65 kg/(m^2).  Generalized: Well developed, in no acute distress   Neurological examination  Mentation: Alert oriented to time, place, history taking. Follows all commands speech and language fluent Cranial nerve II-XII: Extraocular movements were full, visual field were full on confrontational test.  Motor: The motor testing reveals 5 over 5 strength of all 4 extremities. Good symmetric motor tone is noted throughout.  Sensory: Sensory testing is intact to soft touch on all 4 extremities. No evidence of extinction is noted.  Coordination: Cerebellar testing reveals good finger-nose-finger and heel-to-shin bilaterally.  Gait  and station: Gait is normal. Tandem gait is normal. Romberg is negative. No drift is seen.  Reflexes: Deep tendon reflexes are symmetric and normal bilaterally.   DIAGNOSTIC DATA (LABS, IMAGING, TESTING) - I reviewed patient records, labs, notes, testing and imaging myself where available.  Lab Results  Component Value Date   WBC 7.7 12/13/2013   HGB 14.4 12/13/2013   HCT 42.0 12/13/2013   MCV 88.1 12/13/2013   PLT 312 12/13/2013      Component Value Date/Time   NA 142 12/13/2013 1645   K 3.6* 12/13/2013 1645   CL 104 12/13/2013 1645   CO2 23 12/13/2013 1645   GLUCOSE 101* 12/13/2013 1645   BUN 7 12/13/2013 1645   CREATININE 0.90 12/13/2013 1645   CALCIUM 9.4 12/13/2013 1645   GFRNONAA 71* 12/13/2013 1645   GFRAA 91* 12/13/2013 1645       ASSESSMENT AND PLAN 53 y.o. year old female  has a past medical history of Seizures; Diverticulitis; GERD (gastroesophageal reflux disease); and Hiatal hernia. here with :  1. Seizures  Continue Keppra 1000 mg BID. It was just increased on July 1st. Patient is experiencing some drowsiness with this medication, she may not be able to tolerate a higher dose. Patient should let us know if she has any additional seizures before her next visit. Patient should not operate a motor vehicle for at least 6 months from her last seizure Patient will follow up in 1 month with [name] [name] or myself.   Ward Givens, MSN, NP-C 12/15/2013, 11:52 AM [facility] [address], [place], [place] [address] [phone]  Note: This document was prepared with digital dictation and possible smart phrase technology. Any transcriptional errors that result from this process are unintentional.

## 2013-12-15 NOTE — Telephone Encounter (Signed)
Patient at check out today requesting a letter that states it is ok for her to return to work on Monday, please fax letter to her work which is: [phone]. Please make it attention to [employer].

## 2013-12-18 ENCOUNTER — Encounter: Payer: Self-pay | Admitting: *Deleted

## 2013-12-18 NOTE — Telephone Encounter (Signed)
Spoke with patient about work release, per NP MM patient has been released to return to work starting Wednesday 12/20/13, patient verbalized understanding work note has been faxed to [employer] at [phone]

## 2013-12-20 ENCOUNTER — Telehealth: Payer: Self-pay | Admitting: Adult Health

## 2013-12-20 NOTE — Telephone Encounter (Signed)
Please have her come in Thursday or Friday this week. We will need to change to a different medication. The patient should also stay out of work until she is seen in the office.

## 2013-12-20 NOTE — Telephone Encounter (Signed)
Patient had seizure last night, spoke with on call doctor (Dohmeier).  She was instructed to call office this am to inform [name] and [name].  Please call and advise.

## 2013-12-20 NOTE — Telephone Encounter (Signed)
Called pt and pt stated that she is really tired and that this makes the third seizure she has had in the past 3 wks. Pt is currently taking keppra 1000 mg, twice daily. Pt had a seizure last night and spoke with Dr. Brett Fairy, whom instructed pt to call back this morning to make [name] and [name], NP aware of the situation. Please advise

## 2013-12-20 NOTE — Telephone Encounter (Signed)
Called pt to set up appt per Megan,NP, offered pt to come in this afternoon, pt stated that she could not come in this afternoon, made an appt for the pt to come in tomorrow, 12/21/13 at 10 am with [name], NP and not to return to work until pt is seen in the office. I advised the pt that if she has any other problems, questions or concerns to call the office. Pt verbalized understanding. FYI

## 2013-12-21 ENCOUNTER — Ambulatory Visit (INDEPENDENT_AMBULATORY_CARE_PROVIDER_SITE_OTHER): Payer: BC Managed Care – PPO | Admitting: Adult Health

## 2013-12-21 ENCOUNTER — Encounter: Payer: Self-pay | Admitting: Adult Health

## 2013-12-21 ENCOUNTER — Encounter: Payer: Self-pay | Admitting: *Deleted

## 2013-12-21 ENCOUNTER — Ambulatory Visit (INDEPENDENT_AMBULATORY_CARE_PROVIDER_SITE_OTHER): Payer: BC Managed Care – PPO | Admitting: Radiology

## 2013-12-21 VITALS — BP 118/78 | HR 74 | Ht 62.0 in | Wt 170.0 lb

## 2013-12-21 DIAGNOSIS — R569 Unspecified convulsions: Secondary | ICD-10-CM

## 2013-12-21 MED ORDER — CARBAMAZEPINE 200 MG PO TABS: 200.0000 mg | ORAL_TABLET | Freq: Two times a day (BID) | ORAL | Status: DC

## 2013-12-21 NOTE — Progress Notes (Signed)
PATIENT: [name] [name] DOB: 1960-11-06  REASON FOR VISIT: follow up HISTORY FROM: patient  HISTORY OF PRESENT ILLNESS: Ms. Urquilla is a 53 year old female with a history of generalized seziures. Patient returns today after having another seizure. The patient is currently taking Keppra 1000mg  BID. She cannot tolerate an increase in dose due to drowsiness. She states that Tuesday night she had another seizure. States that she was reading a book to her sister when she felt "that feeling coming on." Her sister said that she started shaking in arms and legs. Episodes lasted for about 1-2 minutes. Denies loss of bowels or bladder. States that she was sleepy after the episode. Patient states that the only thing she remembers is having that feeling before it happened. Patient states that she has been having trouble with her vision. She states that she feels "off balance." she recently went to the eye doctor and reports that her check-up was normal.   REVIEW OF SYSTEMS: Full 14 system review of systems performed and notable only for:  Constitutional: N/A  Eyes: N/A Ear/Nose/Throat: N/A  Skin: N/A  Cardiovascular: N/A  Respiratory: N/A  Gastrointestinal: N/A  Genitourinary: N/A Hematology/Lymphatic: N/A  Endocrine: N/A Musculoskeletal:N/A  Allergy/Immunology: N/A  Neurological: N/A Psychiatric: N/A Sleep: N/A   ALLERGIES: Allergies  Allergen Reactions  . Aspirin     sensitivity  . Ciprofloxacin     Seizures & it keeps her awake  . Codeine Hives  . Penicillins Hives    HOME MEDICATIONS: Outpatient Prescriptions Prior to Visit  Medication Sig Dispense Refill  . levETIRAcetam (KEPPRA) 1000 MG tablet Take 1 tablet (1,000 mg total) by mouth 2 (two) times daily.  60 tablet  6  . Nutritional Supplements (ESTROVEN PO) Take by mouth.      Marland Kitchen OVER THE COUNTER MEDICATION Take 1 tablet by mouth daily. OTC. Hot flash medication. Patient unsure of the name.       No  facility-administered medications prior to visit.    PAST MEDICAL HISTORY: Past Medical History  Diagnosis Date  . Seizures   . Diverticulitis   . GERD (gastroesophageal reflux disease)   . Hiatal hernia     PAST SURGICAL HISTORY: Past Surgical History  Procedure Laterality Date  . Tubal ligation    . Hand surgery      FAMILY HISTORY: History reviewed. No pertinent family history.  SOCIAL HISTORY: History   Social History  . Marital Status: Single    Spouse Name: N/A    Number of Children: N/A  . Years of Education: N/A   Occupational History  . Not on file.   Social History Main Topics  . Smoking status: Current Every Day Smoker -- 1.00 packs/day    Types: Cigarettes  . Smokeless tobacco: Never Used  . Alcohol Use: No     Comment: Quit 12/06/2009  . Drug Use: No  . Sexual Activity: Not on file   Other Topics Concern  . Not on file   Social History Narrative   2-3 sodas daily   Lives with sister   Some college   Left handed      PHYSICAL EXAM  Filed Vitals:   12/21/13 0958  BP: 118/78  Pulse: 74  Height: 5\' 2"  (1.575 m)  Weight: 170 lb (77.111 kg)   Body mass index is 31.09 kg/(m^2).  Generalized: Well developed, in no acute distress   Neurological examination  Mentation: Alert oriented to time, place, history taking. Follows all commands  speech and language fluent Cranial nerve II-XII: Pupils were equal round reactive to light. Extraocular movements were full, visual field were full on confrontational test. Mild twitching under the left eyelid- patient states that started after she began having seizures.  Motor: The motor testing reveals 5 over 5 strength of all 4 extremities. Good symmetric motor tone is noted throughout.  Sensory: Sensory testing is intact to soft touch on all 4 extremities. No evidence of extinction is noted.  Coordination: Cerebellar testing reveals good finger-nose-finger and heel-to-shin bilaterally.  Gait and station:  Gait is normal. Tandem gait is normal. Romberg is negative. No drift is seen.  Reflexes: Deep tendon reflexes are symmetric and normal bilaterally.   DIAGNOSTIC DATA (LABS, IMAGING, TESTING) - I reviewed patient records, labs, notes, testing and imaging myself where available.  Lab Results  Component Value Date   WBC 7.7 12/13/2013   HGB 14.4 12/13/2013   HCT 42.0 12/13/2013   MCV 88.1 12/13/2013   PLT 312 12/13/2013      Component Value Date/Time   NA 142 12/13/2013 1645   K 3.6* 12/13/2013 1645   CL 104 12/13/2013 1645   CO2 23 12/13/2013 1645   GLUCOSE 101* 12/13/2013 1645   BUN 7 12/13/2013 1645   CREATININE 0.90 12/13/2013 1645   CALCIUM 9.4 12/13/2013 1645   GFRNONAA 71* 12/13/2013 1645   GFRAA 73* 12/13/2013 1645    ASSESSMENT AND PLAN 53 y.o. year old female  has a past medical history of Seizures; Diverticulitis; GERD (gastroesophageal reflux disease); and Hiatal hernia. here with:  1. Seizures  Patient had what sounds like a generalized seizure this past Tuesday. I will start the patient on carbamazepine 200mg  BID. The patient will remain on Keppra. Once we have seizures under control we will slowly try to taper the keppra down while increasing carbamazepine as needed. The Keppra is causing drowsiness and could also be what is making her feel off balance. I will check another EEG today. The patient should keep her scheduled appointment for next month. Patient was advised to let us know if she has an additional episodes so we can adjust her medication.    Ward Givens, MSN, NP-C 12/21/2013, 10:02 AM [facility] [address], [place], [place] [address] [phone]  Note: This document was prepared with digital dictation and possible smart phrase technology. Any transcriptional errors that result from this process are unintentional.

## 2013-12-21 NOTE — Procedures (Signed)
    History:  [name] [name] is a 53 year old patient with a history of seizures. The patient has shaking of the arms and legs associated with the seizure lasting 1-2 minutes. The patient is on seizure medications. She is being evaluated for her epilepsy.  This is a routine EEG. No skull defects are noted. Medications include carbamazepine, Keppra, and Prilosec.   EEG classification: Essentially normal awake  Description of the recording: The background rhythms of this recording consists of a fairly well modulated low medium amplitude alpha rhythm of 8 Hz that is reactive to eye opening and closure. As the record progresses, the patient appears to remain in the waking state throughout the recording. Photic stimulation was performed, resulting in a bilateral and symmetric photic driving response. Hyperventilation was also performed, resulting in a minimal buildup of the background rhythm activities without significant slowing seen. At no time during the recording does there appear to be evidence of spike or spike wave discharges. Intermittent slowing seen in the left mid temporal area emanating from the T3 electrode, but later in the recording, electrode artifact from this area is seen. EKG monitor shows no evidence of cardiac rhythm abnormalities with a heart rate of 66.  Impression: This is a normal EEG recording in the waking state. No evidence of ictal or interictal discharges are seen.

## 2013-12-21 NOTE — Patient Instructions (Signed)
Carbamazepine extended-release capsules What is this medicine? CARBAMAZEPINE (kar ba MAZ e peen) is used to control seizures caused by certain types of epilepsy. This medicine is also used to treat nerve related pain. It is not for common aches and pains. This medicine may be used for other purposes; ask your health care provider or pharmacist if you have questions. COMMON BRAND NAME(S): Carbatrol What should I tell my health care provider before I take this medicine? They need to know if you have any of these conditions: -Asian ancestry -bone marrow disease -glaucoma -heart disease or irregular heartbeat -kidney disease -liver disease -low blood counts, like low white cell, platelet, or red cell counts -porphyria -psychotic disorders -suicidal thoughts, plans, or attempt; a previous suicide attempt by you or a family member -an unusual or allergic reaction to carbamazepine, tricyclic antidepressants, phenytoin, phenobarbital or other medicines, foods, dyes, or preservatives -pregnant or trying to get pregnant -breast-feeding How should I use this medicine? Take this medicine by mouth with a glass of water. Follow the directions on the prescription label. Do not cut, crush or chew this medicine. Take this medicine with or without food. The capsules can be opened and the beads sprinkled over food such as applesauce or other similar food product. Take your doses at regular intervals. Do not take your medicine more often than directed. Do not stop taking except on your doctor's advice. A special MedGuide will be given to you by the pharmacist with each prescription and refill. Be sure to read this information carefully each time. Talk to your pediatrician regarding the use of this medicine in children. While this drug may be prescribed for selected conditions, precautions do apply. Overdosage: If you think you have taken too much of this medicine contact a poison control center or emergency room  at once. NOTE: This medicine is only for you. Do not share this medicine with others. What if I miss a dose? If you miss a dose, take it as soon as you can. If it is almost time for your next dose, take only that dose. Do not take double or extra doses. What may interact with this medicine? Do not take this medicine with any of the following medications: -delavirdine -MAOIs like Carbex, Eldepryl, Marplan, Nardil, and Parnate -nefazodone -oxcarbazepine This medicine may also interact with the following medications: -acetaminophen -acetazolamide -barbiturate medicines for inducing sleep or treating seizures, like phenobarbital -certain antibiotics like clarithromycin, erythromycin or troleandomycin -cimetidine -cyclosporine -danazol -dicumarol -doxycycline -female hormones, including estrogens and birth control pills -grapefruit juice -isoniazid, INH -levothyroxine and other thyroid hormones -lithium and other medicines to treat mood problems or psychotic disturbances -loratadine -medicines for angina or high blood pressure -medicines for cancer -medicines for depression or anxiety -medicines for sleep -medicines to treat fungal infections, like fluconazole, itraconazole or ketoconazole -medicines used to treat HIV infection or AIDS -methadone -niacinamide -praziquantel -propoxyphene -rifampin or rifabutin -seizure or epilepsy medicine -steroid medicines such as prednisone or cortisone -theophylline -tramadol -warfarin This list may not describe all possible interactions. Give your health care provider a list of all the medicines, herbs, non-prescription drugs, or dietary supplements you use. Also tell them if you smoke, drink alcohol, or use illegal drugs. Some items may interact with your medicine. What should I watch for while using this medicine? Visit your doctor or health care professional for a regular check on your progress. Do not change brands or dosage forms of  this medicine without discussing the change with your doctor or health  care professional. If you are taking this medicine for epilepsy (seizures) do not stop taking it suddenly. This increases the risk of seizures. Wear a Probation officer or necklace. Carry an identification card with information about your condition, medications, and doctor or health care professional. Dennis Bast may get drowsy, dizzy, or have blurred vision. Do not drive, use machinery, or do anything that needs mental alertness until you know how this medicine affects you. To reduce dizzy or fainting spells, do not sit or stand up quickly, especially if you are an older patient. Alcohol can increase drowsiness and dizziness. Avoid alcoholic drinks. Birth control pills may not work properly while you are taking this medicine. Talk to your doctor about using an extra method of birth control. This medicine can make you more sensitive to the sun. Keep out of the sun. If you cannot avoid being in the sun, wear protective clothing and use sunscreen. Do not use sun lamps or tanning beds/booths. The use of this medicine may increase the chance of suicidal thoughts or actions. Pay special attention to how you are responding while on this medicine. Any worsening of mood, or thoughts of suicide or dying should be reported to your health care professional right away. Women who become pregnant while using this medicine may enroll in the [place] Pregnancy Registry by calling [phone]. This registry collects information about the safety of antiepileptic drug use during pregnancy. What side effects may I notice from receiving this medicine? Side effects that you should report to your doctor or health care professional as soon as possible: -allergic reactions like skin rash, itching or hives, swelling of the face, lips, or tongue -breathing problems -changes in vision -confusion -dark urine -fast or irregular  heartbeat -fever or chills, sore throat -mouth ulcers -pain or difficulty passing urine -redness, blistering, peeling or loosening of the skin, including inside the mouth -ringing in the ears -seizures -stomach pain -swollen joints or muscle/joint aches and pains -unusual bleeding or bruising -unusually weak or tired -vomiting -worsening of mood, thoughts or actions of suicide or dying -yellowing of the eyes or skin Side effects that usually do not require medical attention (report to your doctor or health care professional if they continue or are bothersome): -clumsiness or unsteadiness -diarrhea or constipation -headache -increased sweating -nausea This list may not describe all possible side effects. Call your doctor for medical advice about side effects. You may report side effects to FDA at 1-800-FDA-1088. Where should I keep my medicine? Keep out of reach of children. Store at room temperature between 15 and 30 degrees C (59 and 86 degrees F). Keep container tightly closed. Protect from moisture. Throw away any unused medicine after the expiration date. NOTE: This sheet is a summary. It may not cover all possible information. If you have questions about this medicine, talk to your doctor, pharmacist, or health care provider.  2015, Elsevier/Gold Standard. (2012-08-02 15:27:59)

## 2013-12-22 NOTE — Progress Notes (Signed)
Quick Note:  Shared normal EEG results with patient, verbalized understanding ______

## 2013-12-26 ENCOUNTER — Encounter: Payer: Self-pay | Admitting: Adult Health

## 2013-12-27 ENCOUNTER — Telehealth: Payer: Self-pay | Admitting: Adult Health

## 2013-12-27 MED ORDER — LEVETIRACETAM 750 MG PO TABS: 750.0000 mg | ORAL_TABLET | Freq: Two times a day (BID) | ORAL | Status: DC

## 2013-12-27 MED ORDER — OXCARBAZEPINE 300 MG PO TABS: 300.0000 mg | ORAL_TABLET | Freq: Two times a day (BID) | ORAL | Status: DC

## 2013-12-27 NOTE — Telephone Encounter (Signed)
I called the patient. She states that since she started the carbamazepine her drowsiness has increased. She also feels like she is "walking like a drunk." I consulted with [name] and at this time we will stop the carbamazepine. I will decrease Keppra to 750 mg BID. I will add trileptal 300 mg BID. Patient was advised to call us if she had another seizure or if she has any side effects to trileptal. Patient wants to know if she can return to work. I advise that patient that that depends on how she feels. Patient does not drive or operate machinery at her job.

## 2014-01-01 ENCOUNTER — Encounter: Payer: Self-pay | Admitting: Adult Health

## 2014-01-02 ENCOUNTER — Encounter: Payer: Self-pay | Admitting: Neurology

## 2014-01-08 ENCOUNTER — Encounter: Payer: Self-pay | Admitting: Adult Health

## 2014-01-11 NOTE — Telephone Encounter (Signed)
Noted  

## 2014-01-15 ENCOUNTER — Encounter: Payer: Self-pay | Admitting: Adult Health

## 2014-01-15 ENCOUNTER — Encounter: Payer: Self-pay | Admitting: *Deleted

## 2014-01-15 ENCOUNTER — Ambulatory Visit (INDEPENDENT_AMBULATORY_CARE_PROVIDER_SITE_OTHER): Payer: BC Managed Care – PPO | Admitting: Adult Health

## 2014-01-15 VITALS — BP 118/70 | HR 91 | Ht 62.0 in | Wt 170.0 lb

## 2014-01-15 DIAGNOSIS — R569 Unspecified convulsions: Secondary | ICD-10-CM

## 2014-01-15 MED ORDER — OXCARBAZEPINE 300 MG PO TABS: 600.0000 mg | ORAL_TABLET | Freq: Two times a day (BID) | ORAL | Status: DC

## 2014-01-15 MED ORDER — LEVETIRACETAM 500 MG PO TABS: 500.0000 mg | ORAL_TABLET | Freq: Two times a day (BID) | ORAL | Status: DC

## 2014-01-15 MED ORDER — ZOLPIDEM TARTRATE 5 MG PO TABS: 5.0000 mg | ORAL_TABLET | Freq: Every evening | ORAL | Status: DC | PRN

## 2014-01-15 NOTE — Progress Notes (Signed)
PATIENT: LIVY ROSS DOB: 1960-07-28  REASON FOR VISIT: follow up HISTORY FROM: patient  HISTORY OF PRESENT ILLNESS: Ms. Keckler is a 53 year old female with a history of seizures. She returns today for follow-up. She is currently taking Keppra 750 mg BID and Trileptal 300 mg BID. She reports that she has not had a seizure since the last visit. However she had two incidents where she felt that she may have a seizure but it never came. She was recently on antibiotics for a tooth that has to be pulled. Patient states that she is still not sleeping at night. Last night she only got three hours of sleep. She averages about 3-4 hours of sleep at night. She has tried OTC medications - such as melatonin and "sleepy time"  But those are not working. She is currently not operating a motor vehicle.   HISTORY 12/21/13: Ms. Blok is a 53 year old female with a history of generalized seziures. Patient returns today after having another seizure. The patient is currently taking Keppra 1000mg  BID. She cannot tolerate an increase in dose due to drowsiness. She states that Tuesday night she had another seizure. States that she was reading a book to her sister when she felt "that feeling coming on." Her sister said that she started shaking in arms and legs. Episodes lasted for about 1-2 minutes. Denies loss of bowels or bladder. States that she was sleepy after the episode. Patient states that the only thing she remembers is having that feeling before it happened. Patient states that she has been having trouble with her vision. She states that she feels "off balance." she recently went to the eye doctor and reports that her check-up was normal.    HISTORY 12/15/13: 53 year old female with a history of seizures. She returns today for an evaluation. Patient was increased to 1000 mg BID Keppra July 1st. The patient had another seizure Wednesday (12/13/13) with no warning while at work. Patient states that she  collapsed to the ground and was told she was convulsing in all extremities. She has some bruises but did not sustain any injuries. She was taken to the emergency room via ambulance. The patient states that she was lethargic after the fact. Patient states that her mother recently passed at the end of June. She also had a sinus infection at the end of June. She does not operate a motor vehicle.   HISTORY 08/28/13 (PS): Patient recalls getting into the car around 1 week ago, felt "strange", felt like she was going to pass out, no abnormal sounds or smells. Felt shaking all over, her sister noted shaking of all 4 extremities. Patient notes that she was awake during the whole episode, could hear her sister talking, recalls shaking all over, was unable to talk though. Lasted around 1 minute, quickly snapped out of it and was back to her normal self immediately. No tongue biting, no loss of bowel/bladder. Has had episodes similar to this in the past. Has had extensive cardiac and neurologic workup in the past, has been told everything was normal. She does report a lot of stress related to taking care of her mother with late state AD.   REVIEW OF SYSTEMS: Full 14 system review of systems performed and notable only for:  Constitutional: N/A  Eyes: N/A Ear/Nose/Throat: N/A  Skin: N/A  Cardiovascular: N/A  Respiratory: cough Gastrointestinal: N/A  Genitourinary: N/A Hematology/Lymphatic: N/A  Endocrine: N/A Musculoskeletal:N/A  Allergy/Immunology: N/A  Neurological:  seizure Psychiatric: N/A Sleep: insomnia and daytime sleepiness   ALLERGIES: Allergies  Allergen Reactions  . Aspirin     sensitivity  . Ciprofloxacin     Seizures & it keeps her awake  . Codeine Hives  . Penicillins Hives    HOME MEDICATIONS: Outpatient Prescriptions Prior to Visit  Medication Sig Dispense Refill  . levETIRAcetam (KEPPRA) 750 MG tablet Take 1 tablet (750 mg total) by mouth 2 (two) times daily.  60 tablet  6  .  Nutritional Supplements (ESTROVEN PO) Take by mouth.      Marland Kitchen omeprazole (PRILOSEC) 20 MG capsule Take 20 mg by mouth daily.      . Oxcarbazepine (TRILEPTAL) 300 MG tablet Take 1 tablet (300 mg total) by mouth 2 (two) times daily.  60 tablet  3   No facility-administered medications prior to visit.    PAST MEDICAL HISTORY: Past Medical History  Diagnosis Date  . Seizures   . Diverticulitis   . GERD (gastroesophageal reflux disease)   . Hiatal hernia     PAST SURGICAL HISTORY: Past Surgical History  Procedure Laterality Date  . Tubal ligation    . Hand surgery      FAMILY HISTORY: History reviewed. No pertinent family history.  SOCIAL HISTORY: History   Social History  . Marital Status: Single    Spouse Name: N/A    Number of Children: N/A  . Years of Education: N/A   Occupational History  . Not on file.   Social History Main Topics  . Smoking status: Current Every Day Smoker -- 1.00 packs/day    Types: Cigarettes  . Smokeless tobacco: Never Used  . Alcohol Use: No     Comment: Quit 12/06/2009  . Drug Use: No  . Sexual Activity: Not on file   Other Topics Concern  . Not on file   Social History Narrative   2-3 sodas daily   Lives with sister   Some college   Left handed      PHYSICAL EXAM  Filed Vitals:   01/15/14 1342  BP: 118/70  Pulse: 91  Height: 5\' 2"  (1.575 m)  Weight: 170 lb (77.111 kg)   Body mass index is 31.09 kg/(m^2).  Generalized: Well developed, in no acute distress   Neurological examination  Mentation: Alert oriented to time, place, history taking. Follows all commands speech and language fluent Cranial nerve II-XII: Extraocular movements were full, visual field were full on confrontational test. Facial sensation and strength were normal. Head turning and shoulder shrug  were normal and symmetric. Motor: The motor testing reveals 5 over 5 strength of all 4 extremities. Good symmetric motor tone is noted throughout.  Sensory:  Sensory testing is intact to soft touch on all 4 extremities. No evidence of extinction is noted.  Coordination: Cerebellar testing reveals good finger-nose-finger and heel-to-shin bilaterally.  Gait and station: Gait is normal. Tandem gait is normal. Romberg is negative. No drift is seen.  Reflexes: Deep tendon reflexes are symmetric and normal bilaterally.    DIAGNOSTIC DATA (LABS, IMAGING, TESTING) - I reviewed patient records, labs, notes, testing and imaging myself where available.  Lab Results  Component Value Date   WBC 7.7 12/13/2013   HGB 14.4 12/13/2013   HCT 42.0 12/13/2013   MCV 88.1 12/13/2013   PLT 312 12/13/2013      Component Value Date/Time   NA 142 12/13/2013 1645   K 3.6* 12/13/2013 1645   CL 104 12/13/2013 1645   CO2 23 12/13/2013  1645   GLUCOSE 101* 12/13/2013 1645   BUN 7 12/13/2013 1645   CREATININE 0.90 12/13/2013 1645   CALCIUM 9.4 12/13/2013 1645   GFRNONAA 71* 12/13/2013 1645   GFRAA 35* 12/13/2013 1645       ASSESSMENT AND PLAN 53 y.o. year old female  has a past medical history of Seizures; Diverticulitis; GERD (gastroesophageal reflux disease); and Hiatal hernia. here with:  1. Seizures  The patient has had no additional seizures since the last visit. She did have 2 episodes her she felt like she can have a seizure but they never came. I will increase her Trileptal to 2 tablets twice a day. I will decrease the Keppra to 500 mg twice a day. In the future the goal is to continue to wean her off the [place]. At this visit the patient does appear more alert and she agrees that since she has decreased the [place] her drowsiness has improved. Patient continues to complain that she is unable to sleep at night. She averages between 3-4 hours of sleep each night. I will give the patient a prescription for 5 mg of Ambien. I advised the patient to try the increase in Trileptal to see how it affects her drowsiness before starting the Ambien. Patient verbalized understanding. Patient should  keep her appointment in September with [name].   Ward Givens, MSN, NP-C 01/15/2014, 1:47 PM [facility] [address] [place], [place] [address] [phone]  Note: This document was prepared with digital dictation and possible smart phrase technology. Any transcriptional errors that result from this process are unintentional.

## 2014-01-15 NOTE — Patient Instructions (Signed)
Increase trileptal to 600 mg twice a day. Decrease Keppra to 500 mg twice a day. Monitor how the increase in Trileptal affects your drowsiness. If you continue to not be able to sleep then try 5 mg of Ambien.

## 2014-01-16 ENCOUNTER — Encounter: Payer: Self-pay | Admitting: Adult Health

## 2014-01-18 ENCOUNTER — Encounter: Payer: Self-pay | Admitting: *Deleted

## 2014-01-18 ENCOUNTER — Encounter: Payer: Self-pay | Admitting: Adult Health

## 2014-01-19 ENCOUNTER — Encounter: Payer: Self-pay | Admitting: Adult Health

## 2014-01-21 ENCOUNTER — Encounter: Payer: Self-pay | Admitting: Adult Health

## 2014-01-22 ENCOUNTER — Telehealth: Payer: Self-pay | Admitting: Adult Health

## 2014-01-22 ENCOUNTER — Encounter: Payer: Self-pay | Admitting: Adult Health

## 2014-01-22 MED ORDER — OXCARBAZEPINE 300 MG PO TABS: 300.0000 mg | ORAL_TABLET | Freq: Two times a day (BID) | ORAL | Status: DC

## 2014-01-22 MED ORDER — LEVETIRACETAM 750 MG PO TABS: 750.0000 mg | ORAL_TABLET | Freq: Two times a day (BID) | ORAL | Status: DC

## 2014-01-22 NOTE — Telephone Encounter (Signed)
I called the patient. She states that over the weekend she had another seizure that lasted about 1 minute. She states that her sister said that these were similar to the one she had in the past. Patient was doing well when she was on Trileptal 300 mg twice a day and Keppra 750 mg twice a day. We will return to this dosage. At this point the patient may require two medications to control her seizures.

## 2014-01-24 ENCOUNTER — Telehealth: Payer: Self-pay | Admitting: Neurology

## 2014-01-24 NOTE — Telephone Encounter (Signed)
Received call from Dr Feliberto Harts, oral surgeon, in regards to Ms Jahr. She requires extraction of multiple teeth under local anesthesia and he wanted clearance. From a neurological standpoint there is no contraindication to this procedure.

## 2014-01-25 ENCOUNTER — Telehealth: Payer: Self-pay | Admitting: Neurology

## 2014-01-25 ENCOUNTER — Encounter: Payer: Self-pay | Admitting: Adult Health

## 2014-01-25 ENCOUNTER — Telehealth: Payer: Self-pay | Admitting: Adult Health

## 2014-01-25 NOTE — Telephone Encounter (Signed)
[name], Does not look like you or [name] took this patient out of work on 12-14-13. She called the office and was seen by [facility] on 12-15-13. Please review and advise.

## 2014-01-25 NOTE — Telephone Encounter (Signed)
Please let them know that neither Megan or I took her out of work on 7/09.  She was evaluated in the ED on 7/08, so perhaps it was the ED physician?

## 2014-01-25 NOTE — Telephone Encounter (Signed)
Delsa Sale with [employer] - wants to know if [name] placed patient off work on July 9 Call back # [phone]  Claim number is [id number]

## 2014-01-25 NOTE — Telephone Encounter (Signed)
Spoke to [employer] and they will be faxing over a release for the office notes for this patient. They will also be calling the ED in [facility] to see if they were the ones who took her out of work.

## 2014-01-25 NOTE — Telephone Encounter (Signed)
I called the patient. She states that she took an Ambien last night and it made her feel very "weird." She states that she felt like she was "drunk." I advised her to stop this medication. She states that the change in dosage for Keppra and trileptal has decreased her sleepiness and she has been seizure free. I advised the patient at this time we would not change or add anything. Once she is stable and tolerating the keppra and trileptal we will consider starting something else for sleep. She verbalized understanding.

## 2014-01-28 ENCOUNTER — Encounter: Payer: Self-pay | Admitting: Adult Health

## 2014-02-01 ENCOUNTER — Encounter: Payer: Self-pay | Admitting: Adult Health

## 2014-02-05 ENCOUNTER — Telehealth: Payer: Self-pay | Admitting: Neurology

## 2014-02-05 NOTE — Telephone Encounter (Signed)
Spoke to patient to find out why she was requesting STD.  She relayed that she has been out of work since 12-14-13, and has had a seizure at work.  She stated that the floors are concrete and so are the steps, she is afraid of hurting herself.  Also it has taken a while to get her medications right.  She said with one of her falls, she broke a tooth and developed an infection, and required antibiotics twice.  She said she has until 03-08-14 for her STD, I told her that this needed to be approved by the doctor with input from the NP.

## 2014-02-05 NOTE — Telephone Encounter (Signed)
At this point I agree that FMLA is a better option for her. Thanks.

## 2014-02-05 NOTE — Telephone Encounter (Signed)
The patient's last seizure was 01/22/14. At that time we were adjusting her medications and she asked about Short term disability and I considered it. Since then we have controlled her seizures on her current medication. I think the patient would now be a better candidate for FMLA and could return to work. I have discussed this with [name].

## 2014-02-06 ENCOUNTER — Encounter: Payer: Self-pay | Admitting: Neurology

## 2014-02-06 ENCOUNTER — Other Ambulatory Visit: Payer: Self-pay | Admitting: Neurology

## 2014-02-06 MED ORDER — SUVOREXANT 5 MG PO TABS
5.0000 mg | ORAL_TABLET | Freq: Every evening | ORAL | Status: DC | PRN
Start: 2014-02-06 — End: 2014-02-28

## 2014-02-06 NOTE — Telephone Encounter (Signed)
Spoke to patient and relayed that [name] and [name] agreed that FMLA would be a better option for her, and will not complete the STD.

## 2014-02-08 ENCOUNTER — Telehealth: Payer: Self-pay | Admitting: Neurology

## 2014-02-08 NOTE — Telephone Encounter (Signed)
Patient calling to speak with Butch Penny regarding form, please call and advise.

## 2014-02-08 NOTE — Telephone Encounter (Signed)
Spoke to patient and she relayed that the pharmacy faxed over information for her new prescription Belsomra.  I told her it is most likely an authorization that they need and I will relay this to our pharmacy tech, and that there is a process to get the prior authorization.

## 2014-02-08 NOTE — Telephone Encounter (Signed)
Belsomra is not preferred on the patient's insurance plan, and requires prior authorization.  I called them at [phone].  All clinical info was provided, asking that they grant an exception and cover this drug.  Request is currently under review.  They will notify both [place] and the patient of outcome once a decision has been made.  I called the patient back.  Explained PA process.  She verbalized understanding.

## 2014-02-09 ENCOUNTER — Telehealth: Payer: Self-pay

## 2014-02-09 NOTE — Telephone Encounter (Signed)
[facility]) notified us they have approved our request for coverage on Belsomra effective until 02/08/2015 Ref # [id number]

## 2014-02-12 ENCOUNTER — Encounter: Payer: Self-pay | Admitting: Neurology

## 2014-02-13 ENCOUNTER — Other Ambulatory Visit: Payer: Self-pay | Admitting: Neurology

## 2014-02-13 DIAGNOSIS — G4700 Insomnia, unspecified: Secondary | ICD-10-CM

## 2014-02-13 DIAGNOSIS — R5382 Chronic fatigue, unspecified: Secondary | ICD-10-CM

## 2014-02-16 ENCOUNTER — Encounter: Payer: Self-pay | Admitting: Adult Health

## 2014-02-23 ENCOUNTER — Telehealth: Payer: Self-pay | Admitting: *Deleted

## 2014-02-23 ENCOUNTER — Encounter: Payer: Self-pay | Admitting: Adult Health

## 2014-02-23 DIAGNOSIS — R569 Unspecified convulsions: Secondary | ICD-10-CM

## 2014-02-23 NOTE — Telephone Encounter (Signed)
Patient form on [employer]. 02/23/14

## 2014-02-27 NOTE — Telephone Encounter (Signed)
I called the patient. She has a sleep study scheduled for Oct. 15th. I will also send the patient for video EEG monitoring. If her insurance does not approve the video EEG monitoring we will do an ambulatory EEG first. Patient verbalized understanding.

## 2014-02-28 ENCOUNTER — Encounter: Payer: Self-pay | Admitting: Neurology

## 2014-02-28 ENCOUNTER — Ambulatory Visit (INDEPENDENT_AMBULATORY_CARE_PROVIDER_SITE_OTHER): Payer: BC Managed Care – PPO | Admitting: Neurology

## 2014-02-28 VITALS — BP 138/71 | HR 76 | Ht 62.0 in | Wt 170.0 lb

## 2014-02-28 DIAGNOSIS — R569 Unspecified convulsions: Secondary | ICD-10-CM

## 2014-02-28 MED ORDER — LEVETIRACETAM 500 MG PO TABS: 500.0000 mg | ORAL_TABLET | Freq: Two times a day (BID) | ORAL | Status: DC

## 2014-02-28 MED ORDER — SUVOREXANT 10 MG PO TABS: 10.0000 mg | ORAL_TABLET | Freq: Every day | ORAL | Status: DC

## 2014-02-28 MED ORDER — OXCARBAZEPINE 300 MG PO TABS: 450.0000 mg | ORAL_TABLET | Freq: Two times a day (BID) | ORAL | Status: DC

## 2014-02-28 NOTE — Progress Notes (Signed)
PATIENT: DYMON SUMMERHILL DOB: 1960-08-10  REASON FOR VISIT: follow up HISTORY FROM: patient  HISTORY OF PRESENT ILLNESS: Ms. Hennings is a 53 year old female with a history of seizures. She returns today for follow-up. Notes she is having multiple concerns. Her main concern today is continued difficulty falling asleep. She will go in bed around 10pm, will not fall asleep until 3am and then wakes up around 5 or 6am. Notes taking naps during the day. Was taking Belsomra but feels it has not helped.   She is currently taking Keppra 750mg  BID and Trileptal 300mg  BID. Notes her last seizure was 5 days ago. Was sitting in a chair, states she did not fall to the ground but reports she did lose consciousness during the event. She recalls shaking of both extremities during the event. Notes it was consistent with her prior seizure episodes. It occurred after a night that she did not get much sleep.    Prior visit She is currently taking Keppra 750 mg BID and Trileptal 300 mg BID. She reports that she has not had a seizure since the last visit. However she had two incidents where she felt that she may have a seizure but it never came. She was recently on antibiotics for a tooth that has to be pulled. Patient states that she is still not sleeping at night. Last night she only got three hours of sleep. She averages about 3-4 hours of sleep at night. She has tried OTC medications - such as melatonin and "sleepy time"  But those are not working. She is currently not operating a motor vehicle.   HISTORY 12/21/13: Ms. Gray is a 53 year old female with a history of generalized seziures. Patient returns today after having another seizure. The patient is currently taking Keppra 1000mg  BID. She cannot tolerate an increase in dose due to drowsiness. She states that Tuesday night she had another seizure. States that she was reading a book to her sister when she felt "that feeling coming on." Her sister said that  she started shaking in arms and legs. Episodes lasted for about 1-2 minutes. Denies loss of bowels or bladder. States that she was sleepy after the episode. Patient states that the only thing she remembers is having that feeling before it happened. Patient states that she has been having trouble with her vision. She states that she feels "off balance." she recently went to the eye doctor and reports that her check-up was normal.    HISTORY 12/15/13: 53 year old female with a history of seizures. She returns today for an evaluation. Patient was increased to 1000 mg BID Keppra July 1st. The patient had another seizure Wednesday (12/13/13) with no warning while at work. Patient states that she collapsed to the ground and was told she was convulsing in all extremities. She has some bruises but did not sustain any injuries. She was taken to the emergency room via ambulance. The patient states that she was lethargic after the fact. Patient states that her mother recently passed at the end of June. She also had a sinus infection at the end of June. She does not operate a motor vehicle.   HISTORY 08/28/13 (PS): Patient recalls getting into the car around 1 week ago, felt "strange", felt like she was going to pass out, no abnormal sounds or smells. Felt shaking all over, her sister noted shaking of all 4 extremities. Patient notes that she was awake during the whole episode, could  hear her sister talking, recalls shaking all over, was unable to talk though. Lasted around 1 minute, quickly snapped out of it and was back to her normal self immediately. No tongue biting, no loss of bowel/bladder. Has had episodes similar to this in the past. Has had extensive cardiac and neurologic workup in the past, has been told everything was normal. She does report a lot of stress related to taking care of her mother with late state AD.   REVIEW OF SYSTEMS: Full 14 system review of systems performed and notable only for:    Constitutional: N/A  Eyes: N/A Ear/Nose/Throat: N/A  Skin: N/A  Cardiovascular: N/A  Respiratory: cough Gastrointestinal: N/A  Genitourinary: N/A Hematology/Lymphatic: N/A  Endocrine: N/A Musculoskeletal:N/A  Allergy/Immunology: N/A  Neurological: seizure Psychiatric: N/A Sleep: insomnia and daytime sleepiness   ALLERGIES: Allergies  Allergen Reactions  . Aspirin     sensitivity  . Ciprofloxacin     Seizures & it keeps her awake  . Codeine Hives  . Penicillins Hives    HOME MEDICATIONS: Outpatient Prescriptions Prior to Visit  Medication Sig Dispense Refill  . levETIRAcetam (KEPPRA) 750 MG tablet Take 1 tablet (750 mg total) by mouth 2 (two) times daily.  60 tablet  3  . Nutritional Supplements (ESTROVEN PO) Take by mouth.      Marland Kitchen omeprazole (PRILOSEC) 20 MG capsule Take 20 mg by mouth daily.      . Oxcarbazepine (TRILEPTAL) 300 MG tablet Take 1 tablet (300 mg total) by mouth 2 (two) times daily.  120 tablet  3  . clindamycin (CLEOCIN) 300 MG capsule Take 300 mg by mouth every 4 (four) hours.      . levETIRAcetam (KEPPRA) 500 MG tablet       . Suvorexant (BELSOMRA) 5 MG TABS Take 5 mg by mouth at bedtime as needed.  30 tablet  1   No facility-administered medications prior to visit.    PAST MEDICAL HISTORY: Past Medical History  Diagnosis Date  . Seizures   . Diverticulitis   . GERD (gastroesophageal reflux disease)   . Hiatal hernia     PAST SURGICAL HISTORY: Past Surgical History  Procedure Laterality Date  . Tubal ligation    . Hand surgery      FAMILY HISTORY: History reviewed. No pertinent family history.  SOCIAL HISTORY: History   Social History  . Marital Status: Single    Spouse Name: N/A    Number of Children: N/A  . Years of Education: N/A   Occupational History  . Not on file.   Social History Main Topics  . Smoking status: Current Every Day Smoker -- 1.00 packs/day    Types: Cigarettes  . Smokeless tobacco: Never Used  .  Alcohol Use: No     Comment: Quit 12/06/2009  . Drug Use: No  . Sexual Activity: Not on file   Other Topics Concern  . Not on file   Social History Narrative   2-3 sodas daily   Lives with sister   Some college   Left handed      PHYSICAL EXAM  Filed Vitals:   02/28/14 1345  BP: 138/71  Pulse: 76  Height: 5\' 2"  (1.575 m)  Weight: 170 lb (77.111 kg)   Body mass index is 31.09 kg/(m^2).  Generalized: Well developed, in no acute distress   Neurological examination  Mentation: Alert oriented to time, place, history taking. Follows all commands speech and language fluent Cranial nerve II-XII: Extraocular movements were full, visual  field were full on confrontational test. Facial sensation and strength were normal. Head turning and shoulder shrug  were normal and symmetric. Motor: The motor testing reveals 5 over 5 strength of all 4 extremities. Good symmetric motor tone is noted throughout.  Sensory: Sensory testing is intact to soft touch on all 4 extremities. No evidence of extinction is noted.  Coordination: Cerebellar testing reveals good finger-nose-finger and heel-to-shin bilaterally.  Gait and station: Gait is normal. Tandem gait is normal. Romberg is negative. No drift is seen.  Reflexes: Deep tendon reflexes are symmetric and normal bilaterally.    DIAGNOSTIC DATA (LABS, IMAGING, TESTING) - I reviewed patient records, labs, notes, testing and imaging myself where available.  Lab Results  Component Value Date   WBC 7.7 12/13/2013   HGB 14.4 12/13/2013   HCT 42.0 12/13/2013   MCV 88.1 12/13/2013   PLT 312 12/13/2013      Component Value Date/Time   NA 142 12/13/2013 1645   K 3.6* 12/13/2013 1645   CL 104 12/13/2013 1645   CO2 23 12/13/2013 1645   GLUCOSE 101* 12/13/2013 1645   BUN 7 12/13/2013 1645   CREATININE 0.90 12/13/2013 1645   CALCIUM 9.4 12/13/2013 1645   GFRNONAA 71* 12/13/2013 1645   GFRAA 65* 12/13/2013 1645       ASSESSMENT AND PLAN 53 y.o. year old female  has a  past medical history of Seizures; Diverticulitis; GERD (gastroesophageal reflux disease); and Hiatal hernia. here with:  1. Seizures 2. Insomnia  Returns today with continued concerns over difficulty sleeping and breakthrough episodes of possible seizure activity. Based on description and pattern of events would question if these represent non-epileptic seizures. Will aim to taper her off keppra and keep on monotherapy pending workup. Will decrease keppra to 500mg  BID and increase trileptal to 450mg  BID. In the future will continue to taper down keppra. She has been referred for a video EEG for further characterization of her episodes. Will increase Belsomra to 10mg  nightly. She has been referred for a sleep study. Follow up once EEG and sleep study completed.    Jim Like, DO  02/28/2014, 1:53 PM [facility] [address] [place], [place] [address] [phone]  Note: This document was prepared with digital dictation and possible smart phrase technology. Any transcriptional errors that result from this process are unintentional.

## 2014-02-28 NOTE — Patient Instructions (Signed)
Overall you are doing fairly well but I do want to suggest a few things today:   Remember to drink plenty of fluid, eat healthy meals and do not skip any meals. Try to eat protein with a every meal and eat a healthy snack such as fruit or nuts in between meals. Try to keep a regular sleep-wake schedule and try to exercise daily, particularly in the form of walking, 20-30 minutes a day, if you can.   As far as your medications are concerned, I would like to suggest the following: 1)Please decrease keppra to 500mg  twice a day 2)Please increase trileptal to 450mg  (1.5 tabs) twice a day 3)Please try Belsomra 10mg  nightly  You will be called to schedule a prolonged video EEG  Please complete your sleep study  Please follow up with Dr Jannifer Franklin or Ward Givens after you complete the above tests, sooner if we need to. Please call us with any interim questions, concerns, problems, updates or refill requests.   My clinical assistant and will answer any of your questions and relay your messages to me and also relay most of my messages to you.   Our phone number is [phone]. We also have an after hours call service for urgent matters and there is a physician on-call for urgent questions. For any emergencies you know to call 911 or go to the nearest emergency room

## 2014-03-06 NOTE — Telephone Encounter (Signed)
To [name] 03-07-14.

## 2014-03-07 NOTE — Telephone Encounter (Signed)
Form completed.

## 2014-03-08 ENCOUNTER — Other Ambulatory Visit: Payer: Self-pay | Admitting: Adult Health

## 2014-03-08 ENCOUNTER — Encounter: Payer: Self-pay | Admitting: Adult Health

## 2014-03-08 ENCOUNTER — Telehealth: Payer: Self-pay | Admitting: *Deleted

## 2014-03-08 MED ORDER — OXCARBAZEPINE 300 MG PO TABS: 600.0000 mg | ORAL_TABLET | Freq: Two times a day (BID) | ORAL | Status: DC

## 2014-03-08 NOTE — Telephone Encounter (Signed)
I called the patient. She states that she was getting in her brothers Car when she had another seizure. She states that her brother stated that she started shaking in all 4 extremities. She states that she lost consciousness for less than a minute. For now we will increase her Trileptal to 2 tablets twice a day. We are in the process of setting her up for video EEG monitoring with [facility]. She continues to have trouble sleeping at night but has a sleep study scheduled.

## 2014-03-08 NOTE — Telephone Encounter (Signed)
Form faxed to [employer] on 03/08/14.

## 2014-03-08 NOTE — Telephone Encounter (Signed)
Called and spoke to patient. Patient states she is fine but she feels really tired. Patient states she is taking all her medication the right way. Patient has upcoming sleep study .WID Dr.Willis is out of the office for The day will return 03-09-2014.  [place] Patient.

## 2014-03-15 ENCOUNTER — Encounter: Payer: Self-pay | Admitting: Adult Health

## 2014-03-16 ENCOUNTER — Telehealth: Payer: Self-pay | Admitting: *Deleted

## 2014-03-16 NOTE — Telephone Encounter (Signed)
Patient inquiring of the cost of sleep study. She received appointment information in the mail. Can you please give her a call to discuss patient portion of sleep study.

## 2014-03-19 ENCOUNTER — Telehealth: Payer: Self-pay | Admitting: Neurology

## 2014-03-19 NOTE — Telephone Encounter (Signed)
Rickey Primus with [facility] called regarding patient. She states the fax that you sent to her on 10-1 was sent to the wrong fax number but she did get the fax last Friday. Says she will call the patient today to schedule an appointment and will let you know  when the appointment will be.

## 2014-03-21 ENCOUNTER — Telehealth: Payer: Self-pay | Admitting: Neurology

## 2014-03-21 NOTE — Telephone Encounter (Addendum)
ERROR

## 2014-03-22 ENCOUNTER — Encounter: Payer: Self-pay | Admitting: Neurology

## 2014-03-22 ENCOUNTER — Telehealth: Payer: Self-pay | Admitting: Neurology

## 2014-03-22 ENCOUNTER — Ambulatory Visit (INDEPENDENT_AMBULATORY_CARE_PROVIDER_SITE_OTHER): Payer: Self-pay

## 2014-03-22 DIAGNOSIS — G4710 Hypersomnia, unspecified: Secondary | ICD-10-CM

## 2014-03-22 DIAGNOSIS — Z0289 Encounter for other administrative examinations: Secondary | ICD-10-CM

## 2014-03-22 DIAGNOSIS — R569 Unspecified convulsions: Secondary | ICD-10-CM

## 2014-03-22 NOTE — Telephone Encounter (Signed)
Dr. Jim Like is referring [name] [name], 53 y.o. female, for an attended sleep study.  Wt: 170 lbs Ht: 62 in. BMI: 31.09  Diagnoses: Insomnia Fatigue Hx of Seizures Excessive Daytime Sleepiness  Medication List: Current Outpatient Prescriptions  Medication Sig Dispense Refill  . levETIRAcetam (KEPPRA) 500 MG tablet Take 1 tablet (500 mg total) by mouth 2 (two) times daily.  60 tablet  3  . Nutritional Supplements (ESTROVEN PO) Take by mouth.      Marland Kitchen omeprazole (PRILOSEC) 20 MG capsule Take 20 mg by mouth daily.      . Oxcarbazepine (TRILEPTAL) 300 MG tablet Take 2 tablets (600 mg total) by mouth 2 (two) times daily.  90 tablet  3  . Suvorexant (BELSOMRA) 10 MG TABS Take 10 mg by mouth at bedtime.  30 tablet  1   No current facility-administered medications for this visit.    This patient presents to Dr. Jim Like in follow up for seizures.  During this visit, she notes multiple concerns but mainly her continued difficult in falling asleep.  She does not taking naps throughout the day.  Pt notices a recurrence of seizure activity during times that she does not get much sleep.  [name] is referring the patient for a sleep study in order to better help determine the cause of her insomnia.  Insurance:  [employer] - Precertification is not required

## 2014-03-22 NOTE — Telephone Encounter (Signed)
Patient of Dr Jannifer Franklin Sol Blazing NP.  Seizure montage , SPLIt at 15 AHI and score at 3% , obese - no headaches

## 2014-04-09 ENCOUNTER — Telehealth: Payer: Self-pay | Admitting: *Deleted

## 2014-04-09 NOTE — Telephone Encounter (Signed)
Patient was contacted and notified that her overnight sleep study would have to be redone.  The technologist working on the night of her study, Trey Paula, listed the patient's birth date as the date of service.  Therefore the study populated into profusion as a much older study.  The archiving process for February had been completed for older studies and it was deleted by the manager Larwance Sachs, thinking that it had already been archived.  Patient was understanding and a 2nd study was scheduled.  The tech was also counseled on the mistake and it's ramifications.

## 2014-04-17 ENCOUNTER — Ambulatory Visit (INDEPENDENT_AMBULATORY_CARE_PROVIDER_SITE_OTHER): Payer: BC Managed Care – PPO | Admitting: Neurology

## 2014-04-17 DIAGNOSIS — G4733 Obstructive sleep apnea (adult) (pediatric): Secondary | ICD-10-CM

## 2014-04-17 DIAGNOSIS — G4700 Insomnia, unspecified: Secondary | ICD-10-CM

## 2014-04-17 DIAGNOSIS — R0683 Snoring: Secondary | ICD-10-CM

## 2014-04-17 NOTE — Sleep Study (Signed)
Please see the scanned sleep study interpretation located in the Procedure tab within the Chart Review section. 

## 2014-04-19 ENCOUNTER — Encounter: Payer: Self-pay | Admitting: Neurology

## 2014-04-19 ENCOUNTER — Other Ambulatory Visit: Payer: Self-pay | Admitting: *Deleted

## 2014-04-19 ENCOUNTER — Telehealth: Payer: Self-pay | Admitting: *Deleted

## 2014-04-19 ENCOUNTER — Encounter: Payer: Self-pay | Admitting: Adult Health

## 2014-04-19 DIAGNOSIS — G4710 Hypersomnia, unspecified: Secondary | ICD-10-CM

## 2014-04-19 DIAGNOSIS — R569 Unspecified convulsions: Secondary | ICD-10-CM

## 2014-04-19 NOTE — Telephone Encounter (Signed)
Patient was contacted and given very preliminary results of her study.  She was informed that Dr. Kathaleen Grinder wanted to have a follow up visit with her and go over treatment options as well as discuss her test.  [name] was routed a copy of the results and the patient was mailed a copy of the test results.

## 2014-04-20 NOTE — Telephone Encounter (Signed)
-----   Message from [place] sent at 04/17/2014 12:23 PM EST ----- Appointment Request From: [name] [name]     With Provider: Ward Givens, NP [[facility]]    Preferred Date Range: From 04/17/2014 To 04/30/2014    Preferred Times: Monday Morning, Tuesday Morning, Wednesday Morning, Thursday Morning, Friday Morning, Monday Afternoon, Tuesday Afternoon, Wednesday Afternoon, Thursday Afternoon, Friday Afternoon    Reason for visit: Office Visit    Comments:  Can I have an appointment with [name] or [name] to discuss the results of my MRI as [name] never did let me know what it showed.     Thanks,  [name] [name]

## 2014-04-20 NOTE — Telephone Encounter (Signed)
[name] do you want me to schedule this patient with you or [name].  I know you have seen her before.  Please advise.

## 2014-04-24 ENCOUNTER — Encounter: Payer: Self-pay | Admitting: Adult Health

## 2014-05-21 ENCOUNTER — Encounter: Payer: Self-pay | Admitting: Neurology

## 2014-05-21 ENCOUNTER — Ambulatory Visit (INDEPENDENT_AMBULATORY_CARE_PROVIDER_SITE_OTHER): Payer: BC Managed Care – PPO | Admitting: Neurology

## 2014-05-21 VITALS — BP 142/73 | HR 92 | Resp 14 | Ht 62.0 in | Wt 177.5 lb

## 2014-05-21 DIAGNOSIS — F17200 Nicotine dependence, unspecified, uncomplicated: Secondary | ICD-10-CM

## 2014-05-21 DIAGNOSIS — R0902 Hypoxemia: Secondary | ICD-10-CM

## 2014-05-21 DIAGNOSIS — E669 Obesity, unspecified: Secondary | ICD-10-CM

## 2014-05-21 DIAGNOSIS — Z720 Tobacco use: Secondary | ICD-10-CM

## 2014-05-21 DIAGNOSIS — G4733 Obstructive sleep apnea (adult) (pediatric): Secondary | ICD-10-CM

## 2014-05-21 NOTE — Patient Instructions (Addendum)
Sleep Apnea Sleep apnea is disorder that affects a person's sleep. A person with sleep apnea has abnormal pauses in their breathing when they sleep. It is hard for them to get a good sleep. This makes a person tired during the day. It also can lead to other physical problems. There are three types of sleep apnea. One type is when breathing stops for a short time because your airway is blocked (obstructive sleep apnea). Another type is when the brain sometimes fails to give the normal signal to breathe to the muscles that control your breathing (central sleep apnea). The third type is a combination of the other two types. HOME CARE  Do not sleep on your back. Try to sleep on your side.  Take all medicine as told by your doctor.  Avoid alcohol, calming medicines (sedatives), and depressant drugs.  Try to lose weight if you are overweight. Talk to your doctor about a healthy weight goal. Your doctor may have you use a device that helps to open your airway. It can help you get the air that you need. It is called a positive airway pressure (PAP) device. There are three types of PAP devices:  Continuous positive airway pressure (CPAP) device.  Nasal expiratory positive airway pressure (EPAP) device.  Bilevel positive airway pressure (BPAP) device. MAKE SURE YOU:  Understand these instructions.  Will watch your condition.  Will get help right away if you are not doing well or get worse. Document Released: 03/03/2008 Document Revised: 05/11/2012 Document Reviewed: 09/26/2011 [facility] Patient Information 2015 [place], [place]. This information is not intended to replace advice given to you by your health care provider. Make sure you discuss any questions you have with your health care provider. Smoking Cessation, Tips for Success If you are ready to quit smoking, congratulations! You have chosen to help yourself be healthier. Cigarettes bring nicotine, tar, carbon monoxide, and other irritants into  your body. Your lungs, heart, and blood vessels will be able to work better without these poisons. There are many different ways to quit smoking. Nicotine gum, nicotine patches, a nicotine inhaler, or nicotine nasal spray can help with physical craving. Hypnosis, support groups, and medicines help break the habit of smoking. WHAT THINGS CAN I DO TO MAKE QUITTING EASIER?  Here are some tips to help you quit for good:  Pick a date when you will quit smoking completely. Tell all of your friends and family about your plan to quit on that date.  Do not try to slowly cut down on the number of cigarettes you are smoking. Pick a quit date and quit smoking completely starting on that day.  Throw away all cigarettes.   Clean and remove all ashtrays from your home, work, and car.  On a card, write down your reasons for quitting. Carry the card with you and read it when you get the urge to smoke.  Cleanse your body of nicotine. Drink enough water and fluids to keep your urine clear or pale yellow. Do this after quitting to flush the nicotine from your body.  Learn to predict your moods. Do not let a bad situation be your excuse to have a cigarette. Some situations in your life might tempt you into wanting a cigarette.  Never have "just one" cigarette. It leads to wanting another and another. Remind yourself of your decision to quit.  Change habits associated with smoking. If you smoked while driving or when feeling stressed, try other activities to replace smoking. Stand up  when drinking your coffee. Brush your teeth after eating. Sit in a different chair when you read the paper. Avoid alcohol while trying to quit, and try to drink fewer caffeinated beverages. Alcohol and caffeine may urge you to smoke.  Avoid foods and drinks that can trigger a desire to smoke, such as sugary or spicy foods and alcohol.  Ask people who smoke not to smoke around you.  Have something planned to do right after eating or  having a cup of coffee. For example, plan to take a walk or exercise.  Try a relaxation exercise to calm you down and decrease your stress. Remember, you may be tense and nervous for the first 2 weeks after you quit, but this will pass.  Find new activities to keep your hands busy. Play with a pen, coin, or rubber band. Doodle or draw things on paper.  Brush your teeth right after eating. This will help cut down on the craving for the taste of tobacco after meals. You can also try mouthwash.   Use oral substitutes in place of cigarettes. Try using lemon drops, carrots, cinnamon sticks, or chewing gum. Keep them handy so they are available when you have the urge to smoke.  When you have the urge to smoke, try deep breathing.  Designate your home as a nonsmoking area.  If you are a heavy smoker, ask your health care provider about a prescription for nicotine chewing gum. It can ease your withdrawal from nicotine.  Reward yourself. Set aside the cigarette money you save and buy yourself something nice.  Look for support from others. Join a support group or smoking cessation program. Ask someone at home or at work to help you with your plan to quit smoking.  Always ask yourself, "Do I need this cigarette or is this just a reflex?" Tell yourself, "Today, I choose not to smoke," or "I do not want to smoke." You are reminding yourself of your decision to quit.  Do not replace cigarette smoking with electronic cigarettes (commonly called e-cigarettes). The safety of e-cigarettes is unknown, and some may contain harmful chemicals.  If you relapse, do not give up! Plan ahead and think about what you will do the next time you get the urge to smoke. HOW WILL I FEEL WHEN I QUIT SMOKING? You may have symptoms of withdrawal because your body is used to nicotine (the addictive substance in cigarettes). You may crave cigarettes, be irritable, feel very hungry, cough often, get headaches, or have difficulty  concentrating. The withdrawal symptoms are only temporary. They are strongest when you first quit but will go away within 10-14 days. When withdrawal symptoms occur, stay in control. Think about your reasons for quitting. Remind yourself that these are signs that your body is healing and getting used to being without cigarettes. Remember that withdrawal symptoms are easier to treat than the major diseases that smoking can cause.  Even after the withdrawal is over, expect periodic urges to smoke. However, these cravings are generally short lived and will go away whether you smoke or not. Do not smoke! WHAT RESOURCES ARE AVAILABLE TO HELP ME QUIT SMOKING? Your health care provider can direct you to community resources or hospitals for support, which may include:  Group support.  Education.  Hypnosis.  Therapy. Document Released: 02/21/2004 Document Revised: 10/09/2013 Document Reviewed: 11/10/2012 [facility] Patient Information 2015 [place], [place]. This information is not intended to replace advice given to you by your health care provider. Make sure you  discuss any questions you have with your health care provider.   We meet in 45 days to follow up on your autotitration CPAP.

## 2014-05-21 NOTE — Progress Notes (Signed)
SLEEP MEDICINE CLINIC   Provider:  Larey Seat, M D  Referring Provider: Aretta Nip, MD Primary Care Physician:  Milagros Evener, MD  Chief Complaint  Patient presents with  . RV sleep results    Rm 11, alone    HPI:  [name] [name] is a 53 y.o. female is seen here to follow up on her sleep study,   She was a former patient of dr Jim Like , [facility] and had been assigned to Dr Jannifer Franklin.  A sleep study review was requested from me today, and the patient stated hse has not spoken to anybody in the [facility] group about the MRI results. (?)  [name] [name] is a 53 year old Caucasian right-handed female with a history of seizures GERD hiatal hernia and a sleep complaint of chronic insomnia. She has difficulty sleeping through the night and [name] requested a sleep study to evaluate possible nocturnal seizure activity. The patient is also highly fatigued. Today she endorsed the Ep worth Sleepiness Scale at 14 points and the fatigue severity scale at 44 points. The patient underwent a sleep study on 04-17-14 she was awake during the recording time 485 minutes she had a very mild case of apnea with an AHI of 8.3 and an RDI of 15.4. It seems that REM sleep accentuated her apnea greatly 2:30 AHI. She stayed for 33.5 minutes at or below 90%  oxygen saturation.  She also had frequent periodic limb movements and arousals per hour of sleep at 5.7 times.she has no history of restless legs, she is overweight and on multiple medications.   Her sleep continues to be non restorative and  non refreshing. She reports a cyclic sleep disorder, which she does not relate to depression. She sleeps several days too much and then several days too little.   Usually goes to bed between 10 and 11 PM , will take about 30 minutes , she watches TV if she cannot  fall asleep otherwise.  She rises at 6 AM and her alarm will wake her , she takes medication by the cock. .   The patient has not  tried a radio or audio book. She uses a sleep mask, has a shift work history. From 1984 - 2013 .  She doesn't drive. She reports dreaming but is unaware of acting out, she sleeps alone.  Her brother and sister have OSA on CPAP> she is an active smoker or 35 years.       Review of Systems: Out of a complete 14 system review, the patient complains of only the following symptoms, and all other reviewed systems are negative.  insomnia. Snoring.   Epworth score 14, Fatigue severity score 44  , geriatric depression score 3    History   Social History  . Marital Status: Single    Spouse Name: N/A    Number of Children: N/A  . Years of Education: N/A   Occupational History  . Not on file.   Social History Main Topics  . Smoking status: Current Every Day Smoker -- 1.00 packs/day    Types: Cigarettes  . Smokeless tobacco: Never Used  . Alcohol Use: No     Comment: Quit 12/06/2009  . Drug Use: No  . Sexual Activity: Not on file   Other Topics Concern  . Not on file   Social History Narrative   2-3 sodas daily   Lives with sister   Some college   Left handed    Family  History  Problem Relation Age of Onset  . Kidney failure Sister   . Diabetes Sister     Past Medical History  Diagnosis Date  . Seizures   . Diverticulitis   . GERD (gastroesophageal reflux disease)   . Hiatal hernia   . Wrist tendonitis     R     Past Surgical History  Procedure Laterality Date  . Tubal ligation    . Hand surgery      Current Outpatient Prescriptions  Medication Sig Dispense Refill  . FLUVIRIN SUSP   0  . levETIRAcetam (KEPPRA) 500 MG tablet Take 1 tablet (500 mg total) by mouth 2 (two) times daily. 60 tablet 3  . omeprazole (PRILOSEC) 20 MG capsule Take 20 mg by mouth daily.    . Oxcarbazepine (TRILEPTAL) 300 MG tablet Take 2 tablets (600 mg total) by mouth 2 (two) times daily. 90 tablet 3   No current facility-administered medications for this visit.    Allergies as of  05/21/2014 - Review Complete 05/21/2014  Allergen Reaction Noted  . Aspirin  01/21/2012  . Ciprofloxacin  01/21/2012  . Codeine Hives 01/21/2012  . Penicillins Hives 01/21/2012    Vitals: BP 142/73 mmHg  Pulse 92  Resp 14  Ht 5\' 2"  (1.575 m)  Wt 177 lb 8 oz (80.513 kg)  BMI 32.46 kg/m2 Last Weight:  Wt Readings from Last 1 Encounters:  05/21/14 177 lb 8 oz (80.513 kg)       Last Height:   Ht Readings from Last 1 Encounters:  05/21/14 5\' 2"  (1.575 m)    Physical exam:  General: The patient is awake, alert and appears not in acute distress. The patient is well groomed. Head: Normocephalic, atraumatic. Neck is supple. Mallampati 3, large tongue   neck circumference:16. Nasal airflow unrestricted , TMJ is  Not  evident . Retrognathia is not seen.   Bruxism and smoking noted.  Cardiovascular:  Regular rate and rhythm , without  murmurs or carotid bruit, and without distended neck veins. Respiratory: Lungs are clear to auscultation. Skin:  Without evidence of edema, or rash Trunk: BMI is  elevated and patient  has normal posture.  Neurologic exam : The patient is awake and alert, oriented to place and time.   Memory subjective  described as intact. There is a normal attention span & concentration ability. Speech is fluent without dysarthria, dysphonia or aphasia.  Mood and affect are appropriate.  Cranial nerves: Pupils are equal and briskly reactive to light. Funduscopic exam without  evidence of pallor or edema.  Extraocular movements  in vertical and horizontal planes intact and without nystagmus. Visual fields by finger perimetry are intact. Hearing to finger rub intact. Facial sensation intact to fine touch. Facial motor strength is symmetric and tongue and uvula move midline.  Motor exam:  Normal tone, muscle bulk and symmetric strength in all extremities.  Sensory:  Fine touch, pinprick and vibration were tested in all extremities. Proprioception is   normal.  Coordination: Rapid alternating movements in the fingers/hands is normal. Finger-to-nose maneuver normal without evidence of ataxia, dysmetria or tremor.  Gait and station: Patient walks without assistive device and is able unassisted to climb up to the exam table. Strength within normal limits. Stance is stable and normal. Unsteadiness in tandem gait.   Deep tendon reflexes: in the  upper and lower extremities are symmetric and intact. Babinski maneuver  downgoing.   Assessment:  After physical and neurologic examination, review of laboratory studies, imaging,  neurophysiology testing and pre-existing records, assessment is   1) this patient is followed for her spells at [place] planned in January- her clinic  visit was 3 rd  November 2015 . There was no abnormal EEG activity on her EEG a during PSG. MRI   2) the patient had mild apnea . UARS , REM accentuated and PLM , causing arousals. This can contribute to her Epworth score.   The patient was advised of the nature of the diagnosed sleep disorder, OSA with UARS - and I will order an auto-titration CPAP.   the treatment options and risks for general a health and wellness arising from not treating the condition.  Visit duration was 45 minutes.   Plan:  Treatment plan and additional workup : Auto-titration 5-12 cm water and nasal pillow. See DME for desensitization  .  Nasal saline spray to use at night. Septum deviation , but nasal airflow is unrestricted.  PLMs will be followed, these may resolve on CPAP. Smoking  cessation.     Asencion Partridge Sanford Lindblad MD  05/21/2014

## 2014-06-01 ENCOUNTER — Encounter: Payer: Self-pay | Admitting: Adult Health

## 2014-06-09 ENCOUNTER — Encounter: Payer: Self-pay | Admitting: Neurology

## 2014-06-09 DIAGNOSIS — G478 Other sleep disorders: Secondary | ICD-10-CM

## 2014-06-09 DIAGNOSIS — G4733 Obstructive sleep apnea (adult) (pediatric): Secondary | ICD-10-CM

## 2014-06-11 NOTE — Telephone Encounter (Signed)
I have forwarded this to Larwance Sachs, sleep manager.   The patiet had a low AHI of 8.1 and RDI around 15- UARS and mild apnea, I suggested to start autotitration after RV to discuss results. Since she now wants to start right away, will order aur to CPAP 5-12 cm water with a nasal pillow. CD

## 2014-06-12 ENCOUNTER — Encounter: Payer: Self-pay | Admitting: *Deleted

## 2014-06-28 ENCOUNTER — Telehealth: Payer: Self-pay | Admitting: *Deleted

## 2014-06-28 MED ORDER — LEVETIRACETAM 750 MG PO TABS: 750.0000 mg | ORAL_TABLET | Freq: Two times a day (BID) | ORAL | Status: DC

## 2014-06-28 NOTE — Telephone Encounter (Signed)
Called the patient and rescheduled the appointment

## 2014-06-28 NOTE — Telephone Encounter (Signed)
Patient states that she had a seizure last night that lasted about 2 minutes. She states that her seizures had been pretty controlled up until this week. She states that she has been under stress this week due to them switching her insurance and the price of her medication increasing. She states that she has not had to miss any dosages of her medication. She states that her seizures started back after her mother become sick and passed in June. She feels that stress does trigger her seizure events. She continues to take Keppra 500 mg BID and Trileptal 600 mg BID. She does feel that when she was on the higher dose of Keppra it did control her seizures better however it did cause some drowsiness. I will increase Keppra to 750mg  BID. If drowsiness becomes an issue she should let us know. She has her video EEG scheduled for Friday 1/28. I will see the patient back in the office the first week in February (after EEG). If she has any additional seizures she should let us know. She has not started using the CPAP due to financial reasons. She has applied for financial assistance through the DME company and is waiting to hear back from them.

## 2014-07-01 ENCOUNTER — Encounter: Payer: Self-pay | Admitting: Adult Health

## 2014-07-01 ENCOUNTER — Other Ambulatory Visit: Payer: Self-pay | Admitting: Adult Health

## 2014-07-02 ENCOUNTER — Telehealth: Payer: Self-pay | Admitting: Adult Health

## 2014-07-02 MED ORDER — LEVETIRACETAM 750 MG PO TABS: 750.0000 mg | ORAL_TABLET | Freq: Two times a day (BID) | ORAL | Status: DC

## 2014-07-02 NOTE — Telephone Encounter (Signed)
Rx's have been sent.  I called back.  She is aware.

## 2014-07-02 NOTE — Telephone Encounter (Signed)
Patient requesting Rx refill for levETIRAcetam (KEPPRA) 750 MG tablet and Oxcarbazepine (TRILEPTAL) 300 MG tablet forwarded to [facility].  Please call and advise.

## 2014-07-06 DIAGNOSIS — IMO0001 Reserved for inherently not codable concepts without codable children: Secondary | ICD-10-CM | POA: Insufficient documentation

## 2014-07-10 ENCOUNTER — Ambulatory Visit: Payer: Self-pay | Admitting: Adult Health

## 2014-07-10 NOTE — Telephone Encounter (Signed)
Prescription was sent on 1/25 by Norva Pavlov. Phone note in chart.

## 2014-07-18 ENCOUNTER — Ambulatory Visit: Payer: BC Managed Care – PPO | Admitting: Adult Health

## 2014-07-19 ENCOUNTER — Encounter: Payer: Self-pay | Admitting: Adult Health

## 2014-07-19 ENCOUNTER — Ambulatory Visit (INDEPENDENT_AMBULATORY_CARE_PROVIDER_SITE_OTHER): Payer: BLUE CROSS/BLUE SHIELD | Admitting: Adult Health

## 2014-07-19 VITALS — BP 139/75 | HR 73 | Ht 65.0 in | Wt 178.0 lb

## 2014-07-19 DIAGNOSIS — G4733 Obstructive sleep apnea (adult) (pediatric): Secondary | ICD-10-CM

## 2014-07-19 DIAGNOSIS — R569 Unspecified convulsions: Secondary | ICD-10-CM

## 2014-07-19 NOTE — Progress Notes (Signed)
I have read the note, and I agree with the clinical assessment and plan.  WILLIS,CHARLES KEITH   

## 2014-07-19 NOTE — Patient Instructions (Signed)
Continue Zonegran and Trileptal  Please stop by the sleep lab to follow-up on CPAP.

## 2014-07-19 NOTE — Progress Notes (Signed)
PATIENT: [name] [name] DOB: 28-Jun-1960  REASON FOR VISIT: follow up HISTORY FROM: patient  HISTORY OF PRESENT ILLNESS: [name] [name] is a 54 year old female with a history of seizures. She returns today for follow-up. Patient did have video EEG monitoring but no events was noted. They did change her medication. Zonegran 200 mg daily was started. Keppra was stopped. She continues Tripletal. They did notice a left Bundle branch block- her PCP was made aware and she has a referral to cardiology. She states that she has been doing well. Denies having any additional seizures. She did have a sleep study that showed mild sleep apnea- looks like auto-titration was ordered but this has not been done yet.    HISTORY 02/28/14 (SUMNER): [name] [name] is a 54 year old female with a history of seizures. She returns today for follow-up. Notes she is having multiple concerns. Her main concern today is continued difficulty falling asleep. She will go in bed around 10pm, will not fall asleep until 3am and then wakes up around 5 or 6am. Notes taking naps during the day. Was taking Belsomra but feels it has not helped.   She is currently taking Keppra 750mg  BID and Trileptal 300mg  BID. Notes her last seizure was 5 days ago. Was sitting in a chair, states she did not fall to the ground but reports she did lose consciousness during the event. She recalls shaking of both extremities during the event. Notes it was consistent with her prior seizure episodes. It occurred after a night that she did not get much sleep.  Prior visit She is currently taking Keppra 750 mg BID and Trileptal 300 mg BID. She reports that she has not had a seizure since the last visit. However she had two incidents where she felt that she may have a seizure but it never came. She was recently on antibiotics for a tooth that has to be pulled. Patient states that she is still not sleeping at night. Last night she only got three hours of sleep. She  averages about 3-4 hours of sleep at night. She has tried OTC medications - such as melatonin and "sleepy time" But those are not working. She is currently not operating a motor vehicle.   HISTORY 12/21/13: [name] [name] is a 54 year old female with a history of generalized seziures. Patient returns today after having another seizure. The patient is currently taking Keppra 1000mg  BID. She cannot tolerate an increase in dose due to drowsiness. She states that Tuesday night she had another seizure. States that she was reading a book to her sister when she felt "that feeling coming on." Her sister said that she started shaking in arms and legs. Episodes lasted for about 1-2 minutes. Denies loss of bowels or bladder. States that she was sleepy after the episode. Patient states that the only thing she remembers is having that feeling before it happened. Patient states that she has been having trouble with her vision. She states that she feels "off balance." she recently went to the eye doctor and reports that her check-up was normal.   HISTORY 12/15/13: 54 year old female with a history of seizures. She returns today for an evaluation. Patient was increased to 1000 mg BID Keppra July 1st. The patient had another seizure Wednesday (12/13/13) with no warning while at work. Patient states that she collapsed to the ground and was told she was convulsing in all extremities. She has some bruises but did not sustain  any injuries. She was taken to the emergency room via ambulance. The patient states that she was lethargic after the fact. Patient states that her mother recently passed at the end of June. She also had a sinus infection at the end of June. She does not operate a motor vehicle.   HISTORY 08/28/13 (PS): Patient recalls getting into the car around 1 week ago, felt "strange", felt like she was going to pass out, no abnormal sounds or smells. Felt shaking all over, her sister noted shaking of all 4 extremities.  Patient notes that she was awake during the whole episode, could hear her sister talking, recalls shaking all over, was unable to talk though. Lasted around 1 minute, quickly snapped out of it and was back to her normal self immediately. No tongue biting, no loss of bowel/bladder. Has had episodes similar to this in the past. Has had extensive cardiac and neurologic workup in the past, has been told everything was normal. She does report a lot of stress related to taking care of her mother with late state AD.   REVIEW OF SYSTEMS: Out of a complete 14 system review of symptoms, the patient complains only of the following symptoms, and all other reviewed systems are negative.  Restless leg, insomnia, apnea, snoring  ALLERGIES: Allergies  Allergen Reactions  . Aspirin     sensitivity  . Ciprofloxacin     Seizures & it keeps her awake  . Codeine Hives  . Penicillins Hives    HOME MEDICATIONS: Outpatient Prescriptions Prior to Visit  Medication Sig Dispense Refill  . FLUVIRIN SUSP   0  . omeprazole (PRILOSEC) 20 MG capsule Take 20 mg by mouth daily.    . Oxcarbazepine (TRILEPTAL) 300 MG tablet Take 2 tablets (600 mg total) by mouth 2 (two) times daily. 90 tablet 3  . Oxcarbazepine (TRILEPTAL) 300 MG tablet TAKE 2 TABLETS BY MOUTH TWICE DAILY 120 tablet 3  . levETIRAcetam (KEPPRA) 750 MG tablet Take 1 tablet (750 mg total) by mouth 2 (two) times daily. (Patient not taking: Reported on 07/19/2014) 60 tablet 3   No facility-administered medications prior to visit.    PAST MEDICAL HISTORY: Past Medical History  Diagnosis Date  . Seizures   . Diverticulitis   . GERD (gastroesophageal reflux disease)   . Hiatal hernia   . Wrist tendonitis     R     PAST SURGICAL HISTORY: Past Surgical History  Procedure Laterality Date  . Tubal ligation    . Hand surgery      FAMILY HISTORY: Family History  Problem Relation Age of Onset  . Kidney failure Sister   . Diabetes Sister      SOCIAL HISTORY: History   Social History  . Marital Status: Single    Spouse Name: N/A  . Number of Children: N/A  . Years of Education: N/A   Occupational History  . Not on file.   Social History Main Topics  . Smoking status: Current Every Day Smoker -- 1.00 packs/day    Types: Cigarettes  . Smokeless tobacco: Never Used  . Alcohol Use: No     Comment: Quit 12/06/2009  . Drug Use: No  . Sexual Activity: Not on file   Other Topics Concern  . Not on file   Social History Narrative   2-3 sodas daily   Lives with sister   Some college   Left handed      PHYSICAL EXAM  Filed Vitals:   07/19/14 1137  BP: 139/75  Pulse: 73  Height: 5\' 5"  (1.651 m)  Weight: 178 lb (80.74 kg)   Body mass index is 29.62 kg/(m^2).  Generalized: Well developed, in no acute distress   Neurological examination  Mentation: Alert oriented to time, place, history taking. Follows all commands speech and language fluent Cranial nerve II-XII: Pupils were equal round reactive to light. Extraocular movements were full, visual field were full on confrontational test. Facial sensation and strength were normal. Uvula tongue midline. Head turning and shoulder shrug  were normal and symmetric. Motor: The motor testing reveals 5 over 5 strength of all 4 extremities. Good symmetric motor tone is noted throughout.  Sensory: Sensory testing is intact to soft touch on all 4 extremities. No evidence of extinction is noted.  Coordination: Cerebellar testing reveals good finger-nose-finger and heel-to-shin bilaterally.  Gait and station: Gait is normal. Tandem gait is normal. Romberg is negative. No drift is seen.  Reflexes: Deep tendon reflexes are symmetric and normal bilaterally.    DIAGNOSTIC DATA (LABS, IMAGING, TESTING) - I reviewed patient records, labs, notes, testing and imaging myself where available.   ASSESSMENT AND PLAN 54 y.o. year old female  has a past medical history of Seizures;  Diverticulitis; GERD (gastroesophageal reflux disease); Hiatal hernia; and Wrist tendonitis. here with:  1. Seizures 2. OSA  Patient has not had any recent seizures. Her medications was adjusted after the video EEG. She will continue to take Zonegran and Trileptal. If she has any additional seizures she should let us know. I have also requested that the patient stop by the sleep lab to have auto- titration setup. Patient verbalized understanding. She will F/U in 6 months or sooner if needed.   Ward Givens, MSN, NP-C 07/19/2014, 11:53 AM [facility] [address] [place], [place] [address] [phone]  Note: This document was prepared with digital dictation and possible smart phrase technology. Any transcriptional errors that result from this process are unintentional.

## 2014-07-28 ENCOUNTER — Encounter: Payer: Self-pay | Admitting: Adult Health

## 2014-07-30 MED ORDER — ZONISAMIDE 100 MG PO CAPS: ORAL_CAPSULE | ORAL | Status: DC

## 2014-07-30 NOTE — Telephone Encounter (Signed)
Patient had a seizure last night. She states that she woke up having one. She had her CPAP on and it took a while for her to be able to get it off. She states that it was scary for her because she felt that she couldn't breathe with the CPAP on. I will increase her Zonegran to 200 mg in the AM and 100 mg in the PM. She will let me know if she has any additional seizures.

## 2014-08-02 DIAGNOSIS — Z0289 Encounter for other administrative examinations: Secondary | ICD-10-CM

## 2014-08-08 ENCOUNTER — Encounter: Payer: Self-pay | Admitting: Adult Health

## 2014-08-19 ENCOUNTER — Encounter: Payer: Self-pay | Admitting: Adult Health

## 2014-08-20 ENCOUNTER — Encounter: Payer: Self-pay | Admitting: Adult Health

## 2014-08-20 MED ORDER — ZONISAMIDE 100 MG PO CAPS: 200.0000 mg | ORAL_CAPSULE | Freq: Two times a day (BID) | ORAL | Status: DC

## 2014-08-20 NOTE — Telephone Encounter (Signed)
I called the patient. She states that last Thursday she was in the pharmacy getting her medication and she started to feel as if she was going to have a seizure. She started to have mild shaking but was able to hold on to the buggy. She states that her sister walked up and could hear her tapping her foot. She states that she could hear her sister talking to her but she was unable to respond. She did not lose consciousness. She states that it lasted about 1 minute and afterwards she felt very sleepy. She denies missing any medication. Denies any changes in her medication. Denies being sick or lack of sleep. Denies any new stressors in her life. I will increase her Zonegran to 200 mg twice a day. She will let me know if she has any additional seizure events.

## 2014-08-20 NOTE — Telephone Encounter (Signed)
I called the patient. She states that she had a seizure Thursday. States that she was in the pharmacy getting her medication. She states that she began to start with mild shaking but was able to hold onto the cart. She states that her sister could hear her tapping her foot. She states that she could hear her sister talking to her but could not respond. This lasted for approximately 1 minute. Afterwards the patient felt very sleepy. She denies any changes with her medication, denies missing any medication. Denies being sick. Denies lack of sleep. Denies any new stressors in her life. I will increase Zonegran to 200 mg twice a day. The patient verbalized understanding. She'll let us know she has any additional seizures.

## 2014-08-24 ENCOUNTER — Encounter: Payer: Self-pay | Admitting: Neurology

## 2014-09-04 ENCOUNTER — Telehealth: Payer: Self-pay | Admitting: Neurology

## 2014-09-04 ENCOUNTER — Other Ambulatory Visit (INDEPENDENT_AMBULATORY_CARE_PROVIDER_SITE_OTHER): Payer: Self-pay

## 2014-09-04 ENCOUNTER — Other Ambulatory Visit: Payer: Self-pay | Admitting: Neurology

## 2014-09-04 DIAGNOSIS — R569 Unspecified convulsions: Secondary | ICD-10-CM

## 2014-09-04 DIAGNOSIS — Z0289 Encounter for other administrative examinations: Secondary | ICD-10-CM

## 2014-09-04 NOTE — Telephone Encounter (Signed)
She is on oxcarbezapine and zonisamide for seizures,   I spoke to sister Lorna Few    Preivious seizure one month ago  She felt strange and woke up 330 am and went to bathroom where she had a seizure and hit her head.   She is currenlty a little groggyu which is typical after a seizure.  Also has a little stomach flu which I explained may have helped trigger the seizure,    Advised: 1.  Take one additional oxcarbeazapine now, contiunue typical dose in am 2.  If another seizure or getting worse over next day to go to ER 3.   In am go to office to get blood levels checked .. I will place ordres

## 2014-09-05 ENCOUNTER — Encounter: Payer: Self-pay | Admitting: Adult Health

## 2014-09-06 LAB — CBC WITH DIFFERENTIAL/PLATELET
Basophils Absolute: 0 10*3/uL (ref 0.0–0.2)
Basos: 0 %
Eos: 1 %
Eosinophils Absolute: 0.1 10*3/uL (ref 0.0–0.4)
HCT: 46.6 % (ref 34.0–46.6)
Hemoglobin: 16 g/dL — ABNORMAL HIGH (ref 11.1–15.9)
Immature Grans (Abs): 0 10*3/uL (ref 0.0–0.1)
Immature Granulocytes: 0 %
LYMPHS: 33 %
Lymphocytes Absolute: 3.2 10*3/uL — ABNORMAL HIGH (ref 0.7–3.1)
MCH: 30.8 pg (ref 26.6–33.0)
MCHC: 34.3 g/dL (ref 31.5–35.7)
MCV: 90 fL (ref 79–97)
Monocytes Absolute: 0.4 10*3/uL (ref 0.1–0.9)
Monocytes: 4 %
NEUTROS PCT: 62 %
Neutrophils Absolute: 6.1 10*3/uL (ref 1.4–7.0)
PLATELETS: 240 10*3/uL (ref 150–379)
RBC: 5.2 x10E6/uL (ref 3.77–5.28)
RDW: 13.3 % (ref 12.3–15.4)
WBC: 9.8 10*3/uL (ref 3.4–10.8)

## 2014-09-06 LAB — BASIC METABOLIC PANEL
BUN / CREAT RATIO: 8 — AB (ref 9–23)
BUN: 8 mg/dL (ref 6–24)
CALCIUM: 9.4 mg/dL (ref 8.7–10.2)
CHLORIDE: 107 mmol/L (ref 97–108)
CO2: 18 mmol/L (ref 18–29)
Creatinine, Ser: 1 mg/dL (ref 0.57–1.00)
GFR calc non Af Amer: 64 mL/min/{1.73_m2} (ref >59)
GFR, EST AFRICAN AMERICAN: 73 mL/min/{1.73_m2} (ref >59)
Glucose: 100 mg/dL — ABNORMAL HIGH (ref 65–99)
POTASSIUM: 3.8 mmol/L (ref 3.5–5.2)
Sodium: 142 mmol/L (ref 134–144)

## 2014-09-06 LAB — 10-HYDROXYCARBAZEPINE: OXCARBAZEPINE SERPL-MCNC: 40 ug/mL — AB (ref 10–35)

## 2014-09-06 LAB — ZONISAMIDE LEVEL: Zonisamide: 29.1 ug/mL (ref 10.0–40.0)

## 2014-09-06 NOTE — Telephone Encounter (Signed)
-----   Message from Britt Bottom, MD sent at 09/06/2014  1:16 PM EDT ----- Labs and drug levels are ok... stay on current doses

## 2014-09-06 NOTE — Telephone Encounter (Signed)
Spoke with Clyde Canterbury and per RAS, advised that labwork, med levels are ok; she should continue current meds at current doses/fim

## 2014-09-07 ENCOUNTER — Ambulatory Visit (INDEPENDENT_AMBULATORY_CARE_PROVIDER_SITE_OTHER): Payer: BLUE CROSS/BLUE SHIELD | Admitting: Neurology

## 2014-09-07 ENCOUNTER — Encounter: Payer: Self-pay | Admitting: Neurology

## 2014-09-07 VITALS — BP 136/86 | HR 106 | Ht 62.0 in | Wt 171.8 lb

## 2014-09-07 DIAGNOSIS — F410 Panic disorder [episodic paroxysmal anxiety] without agoraphobia: Secondary | ICD-10-CM | POA: Diagnosis not present

## 2014-09-07 DIAGNOSIS — R569 Unspecified convulsions: Secondary | ICD-10-CM | POA: Diagnosis not present

## 2014-09-07 HISTORY — DX: Panic disorder (episodic paroxysmal anxiety): F41.0

## 2014-09-07 MED ORDER — OXCARBAZEPINE 600 MG PO TABS: 600.0000 mg | ORAL_TABLET | Freq: Two times a day (BID) | ORAL | Status: DC

## 2014-09-07 MED ORDER — LAMOTRIGINE 25 MG PO TABS: ORAL_TABLET | ORAL | Status: DC

## 2014-09-07 NOTE — Patient Instructions (Addendum)
With the Zonegran, drop to 1 tablet daily for 3 weeks, then stop the medication. We will start Lamictal, going up by 50 mg every 2 weeks until you get to 75 mg twice daily for 2 weeks. At that time, call me, I will call in the 100 mg tablets of the medication.   Epilepsy Epilepsy is a disorder in which a person has repeated seizures over time. A seizure is a release of abnormal electrical activity in the brain. Seizures can cause a change in attention, behavior, or the ability to remain awake and alert (altered mental status). Seizures often involve uncontrollable shaking (convulsions).  Most people with epilepsy lead normal lives. However, people with epilepsy are at an increased risk of falls, accidents, and injuries. Therefore, it is important to begin treatment right away. CAUSES  Epilepsy has many possible causes. Anything that disturbs the normal pattern of brain cell activity can lead to seizures. This may include:   Head injury.  Birth trauma.  High fever as a child.  Stroke.  Bleeding into or around the brain.  Certain drugs.  Prolonged low oxygen, such as what occurs after CPR efforts.  Abnormal brain development.  Certain illnesses, such as meningitis, encephalitis (brain infection), malaria, and other infections.  An imbalance of nerve signaling chemicals (neurotransmitters).  SIGNS AND SYMPTOMS  The symptoms of a seizure can vary greatly from one person to another. Right before a seizure, you may have a warning (aura) that a seizure is about to occur. An aura may include the following symptoms:  Fear or anxiety.  Nausea.  Feeling like the room is spinning (vertigo).  Vision changes, such as seeing flashing lights or spots. Common symptoms during a seizure include:  Abnormal sensations, such as an abnormal smell or a bitter taste in the mouth.   Sudden, general body stiffness.   Convulsions that involve rhythmic jerking of the face, arm, or leg on one or  both sides.   Sudden change in consciousness.   Appearing to be awake but not responding.   Appearing to be asleep but cannot be awakened.   Grimacing, chewing, lip smacking, drooling, tongue biting, or loss of bowel or bladder control. After a seizure, you may feel sleepy for a while. DIAGNOSIS  Your health care provider will ask about your symptoms and take a medical history. Descriptions from any witnesses to your seizures will be very helpful in the diagnosis. A physical exam, including a detailed neurological exam, is necessary. Various tests may be done, such as:   An electroencephalogram (EEG). This is a painless test of your brain waves. In this test, a diagram is created of your brain waves. These diagrams can be interpreted by a specialist.  An MRI of the brain.   A CT scan of the brain.   A spinal tap (lumbar puncture, LP).  Blood tests to check for signs of infection or abnormal blood chemistry. TREATMENT  There is no cure for epilepsy, but it is generally treatable. Once epilepsy is diagnosed, it is important to begin treatment as soon as possible. For most people with epilepsy, seizures can be controlled with medicines. The following may also be used:  A pacemaker for the brain (vagus nerve stimulator) can be used for people with seizures that are not well controlled by medicine.  Surgery on the brain. For some people, epilepsy eventually goes away. HOME CARE INSTRUCTIONS   Follow your health care provider's recommendations on driving and safety in normal activities.  Get enough rest. Lack of sleep can cause seizures.  Only take over-the-counter or prescription medicines as directed by your health care provider. Take any prescribed medicine exactly as directed.  Avoid any known triggers of your seizures.  Keep a seizure diary. Record what you recall about any seizure, especially any possible trigger.   Make sure the people you live and work with know  that you are prone to seizures. They should receive instructions on how to help you. In general, a witness to a seizure should:   Cushion your head and body.   Turn you on your side.   Avoid unnecessarily restraining you.   Not place anything inside your mouth.   Call for emergency medical help if there is any question about what has occurred.   Follow up with your health care provider as directed. You may need regular blood tests to monitor the levels of your medicine.  SEEK MEDICAL CARE IF:   You develop signs of infection or other illness. This might increase the risk of a seizure.   You seem to be having more frequent seizures.   Your seizure pattern is changing.  SEEK IMMEDIATE MEDICAL CARE IF:   You have a seizure that does not stop after a few moments.   You have a seizure that causes any difficulty in breathing.   You have a seizure that results in a very severe headache.   You have a seizure that leaves you with the inability to speak or use a part of your body.  Document Released: 05/25/2005 Document Revised: 03/15/2013 Document Reviewed: 01/04/2013 [facility] Patient Information 2015 [place], [place]. This information is not intended to replace advice given to you by your health care provider. Make sure you discuss any questions you have with your health care provider.

## 2014-09-07 NOTE — Progress Notes (Signed)
Reason for visit: Seizures  [name] [name] is an 54 y.o. female  History of present illness:  [name] [name] is a 54 year old left-handed white female with a history of intractable seizure events. The patient has undergone MRI evaluation of the brain previously that questions some findings consistent with right mesial temporal sclerosis. The patient has had an EEG study, one was normal, one showed some intermittent left temporal slowing. The patient continues to have ongoing seizure events, she has had 2 seizures in the last week. The patient does not operate a motor vehicle. She indicates that the seizures are preceded by an olfactory sensation, and then within an hour, she will have an event of loss of consciousness with jerking. At times, she can "talk herself out of" a seizure. The patient has been sent to [facility] for video EEG monitoring, but the patient did not have a seizure event during the monitoring study. The patient was placed on Zonegran at [facility]. The patient has not been able to tolerate the medication, she indicates that this has increased her problems with anxiety, she is having daily panic attacks, she cannot take a shower or go to stores. The patient is having diarrhea on the medication as well. She currently is on 100 mg twice daily of medication. She is on 600 mg twice daily of Trileptal, and she is running good levels with this. She returns to this office for an evaluation. The patient was placed on hydroxyzine for her panic problems.   Past Medical History  Diagnosis Date  . Seizures   . Diverticulitis   . GERD (gastroesophageal reflux disease)   . Hiatal hernia   . Wrist tendonitis     R   . Panic disorder 09/07/2014    Past Surgical History  Procedure Laterality Date  . Tubal ligation    . Hand surgery      Family History  Problem Relation Age of Onset  . Kidney failure Sister   . Diabetes Sister     Social history:  reports that she has been smoking  Cigarettes.  She has been smoking about 1.00 pack per day. She has never used smokeless tobacco. She reports that she does not drink alcohol or use illicit drugs.    Allergies  Allergen Reactions  . Aspirin     sensitivity  . Ciprofloxacin     Seizures & it keeps her awake  . Codeine Hives  . Penicillins Hives    Medications:  Prior to Admission medications   Medication Sig Start Date End Date Taking? Authorizing Provider  FLUVIRIN SUSP  03/23/14  Yes Historical Provider, MD  hydrOXYzine (ATARAX/VISTARIL) 25 MG tablet Take 12.5 mg by mouth 3 (three) times daily as needed. 08/15/14  Yes Historical Provider, MD  omeprazole (PRILOSEC) 20 MG capsule Take 20 mg by mouth daily.   Yes Historical Provider, MD  Oxcarbazepine (TRILEPTAL) 300 MG tablet Take 2 tablets (600 mg total) by mouth 2 (two) times daily. 03/08/14  Yes Megan Spero Curb, NP  Oxcarbazepine (TRILEPTAL) 300 MG tablet TAKE 2 TABLETS BY MOUTH TWICE DAILY 07/01/14  Yes Megan P Millikan, NP  zonisamide (ZONEGRAN) 100 MG capsule Take 2 capsules (200 mg total) by mouth 2 (two) times daily. 08/20/14  Yes Megan Spero Curb, NP    ROS:  Out of a complete 14 system review of symptoms, the patient complains only of the following symptoms, and all other reviewed systems are negative.  Diarrhea  Agitation  Anxiety  Seizures  Blood pressure 136/86, pulse 106, height 5\' 2"  (1.575 m), weight 171 lb 12.8 oz (77.928 kg).  Physical Exam  General: The patient is alert and cooperative at the time of the examination.The patient is moderately obese.  Skin: No significant peripheral edema is noted.   Neurologic Exam  Mental status: The patient is alert and oriented x 3 at the time of the examination. The patient has apparent normal recent and remote memory, with an apparently normal attention span and concentration ability.   Cranial nerves: Facial symmetry is present. Speech is normal, no aphasia or dysarthria is noted. Extraocular  movements are full. Visual fields are full. Pupils are equal, round, and reactive to light. Discs are flat bilaterally.  Motor: The patient has good strength in all 4 extremities.  Sensory examination: Soft touch sensation is symmetric on the face, arms, and legs.  Coordination: The patient has good finger-nose-finger and heel-to-shin bilaterally.  Gait and station: The patient has a normal gait. Tandem gait is normal. Romberg is negative. No drift is seen.  Reflexes: Deep tendon reflexes are symmetric.   MRI brain 08/24/13:  IMPRESSION: 1. No acute intracranial abnormality. 2. 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 3. Nonspecific cerebral white matter signal changes, mild to moderate for age and with mild progression since 2007.   Assessment/Plan:  1. Intractable seizure disorder  2. Anxiety/panic disorder  The patient is having increased anxiety on Zonegran along with diarrhea. The patient will be reduced on the dose taking 100 mg at night for 3 weeks, and then stop the medication. The patient will be placed on Lamictal going up by 50 mg every 2 weeks. The patient will call me when she is on 75 mg twice daily, we will increase the dose to 100 mg twice daily. The patient is not to operate a motor vehicle. The patient will follow-up in about 4 months. At some point, repeating a video EEG monitor study may be needed. The patient is to stop the hydroxyzine, as antihistamines may sometimes exacerbate seizures.   Jill Alexanders MD 09/09/2014 3:52 PM  [facility] [address] [place], [place] [address]  Phone [phone] Fax [phone]

## 2014-09-10 ENCOUNTER — Other Ambulatory Visit: Payer: Self-pay

## 2014-09-10 MED ORDER — OXCARBAZEPINE 600 MG PO TABS: 600.0000 mg | ORAL_TABLET | Freq: Two times a day (BID) | ORAL | Status: DC

## 2014-09-19 ENCOUNTER — Telehealth: Payer: Self-pay | Admitting: Internal Medicine

## 2014-09-19 NOTE — Telephone Encounter (Signed)
Received records from [facility] for appointment with Dr Debara Pickett on 10/11/2014.  Records given to [facility] (medical records) for Dr [facility] schedule on 10/11/14.

## 2014-09-21 ENCOUNTER — Encounter: Payer: Self-pay | Admitting: Neurology

## 2014-10-11 ENCOUNTER — Encounter: Payer: Self-pay | Admitting: Internal Medicine

## 2014-10-11 ENCOUNTER — Ambulatory Visit (INDEPENDENT_AMBULATORY_CARE_PROVIDER_SITE_OTHER): Payer: BLUE CROSS/BLUE SHIELD | Admitting: Internal Medicine

## 2014-10-11 VITALS — BP 132/90 | HR 82 | Ht 62.0 in | Wt 171.0 lb

## 2014-10-11 DIAGNOSIS — Z720 Tobacco use: Secondary | ICD-10-CM | POA: Diagnosis not present

## 2014-10-11 DIAGNOSIS — I447 Left bundle-branch block, unspecified: Secondary | ICD-10-CM | POA: Diagnosis not present

## 2014-10-11 DIAGNOSIS — I4510 Unspecified right bundle-branch block: Secondary | ICD-10-CM | POA: Diagnosis not present

## 2014-10-11 DIAGNOSIS — F17200 Nicotine dependence, unspecified, uncomplicated: Secondary | ICD-10-CM

## 2014-10-11 DIAGNOSIS — Z8249 Family history of ischemic heart disease and other diseases of the circulatory system: Secondary | ICD-10-CM | POA: Diagnosis not present

## 2014-10-11 NOTE — Progress Notes (Signed)
OFFICE NOTE  Chief Complaint:  New LBBB  Primary Care Physician: Milagros Evener, MD  HPI:  [name] [name] is a pleasant 54 year old female is coming referred to me from Dr. Zadie Rhine for evaluation of an abnormal EKG. Her past medical history significant for epilepsy which was recently diagnosed and while she was at [facility] her EKG was abnormal indicating a left bundle branch block. She's followed locally by [name] who is been adjusting her epileptic medications. She was also found to have obstructive sleep apnea is on CPAP. She is a smoker and is been a pack per day smoker for 35 years. There is also a family history of coronary disease in her sister who has 3 prior stents but is has type 1 diabetes for over 30 years. She denies any chest pain or worsening shortness of breath. She is not significantly physically active due to her seizures.  PMHx:  Past Medical History  Diagnosis Date  . Seizures   . Diverticulitis   . GERD (gastroesophageal reflux disease)   . Hiatal hernia   . Wrist tendonitis     R   . Panic disorder 09/07/2014  . OSA on CPAP     Past Surgical History  Procedure Laterality Date  . Tubal ligation    . Hand surgery      FAMHx:  Family History  Problem Relation Age of Onset  . Kidney failure Sister   . Diabetes Sister   . Alzheimer's disease Mother   . Hypertension Mother   . Hypertension Father   . Stroke Father   . Coronary artery disease Maternal Grandfather   . Parkinson's disease Maternal Grandmother   . Stroke Brother   . Hypertension Brother   . Sleep apnea Brother     SOCHx:   reports that she has been smoking Cigarettes.  She has a 35 pack-year smoking history. She has never used smokeless tobacco. She reports that she does not drink alcohol or use illicit drugs.  ALLERGIES:  Allergies  Allergen Reactions  . Zonisamide Anxiety    Panic Attacks  . Aspirin     sensitivity  . Ciprofloxacin     Seizures & it keeps her awake   . Codeine Hives  . Penicillins Hives    ROS: A comprehensive review of systems was negative except for: Neurological: positive for seizures  HOME MEDS: Current Outpatient Prescriptions  Medication Sig Dispense Refill  . lamoTRIgine (LAMICTAL) 25 MG tablet One tablet twice a day for 2 weeks, then take 2 tablets twice a day for 2 weeks, then take 3 tablets twice a day for 2 weeks 240 tablet 1  . NON FORMULARY at bedtime. CPAP    . omeprazole (PRILOSEC) 20 MG capsule Take 20 mg by mouth daily.    Marland Kitchen oxcarbazepine (TRILEPTAL) 600 MG tablet Take 1 tablet (600 mg total) by mouth 2 (two) times daily. 180 tablet 1   No current facility-administered medications for this visit.    LABS/IMAGING: No results found for this or any previous visit (from the past 48 hour(s)). No results found.  WEIGHTS: Wt Readings from Last 3 Encounters:  10/11/14 171 lb (77.565 kg)  09/07/14 171 lb 12.8 oz (77.928 kg)  07/19/14 178 lb (80.74 kg)    VITALS: BP 132/90 mmHg  Pulse 82  Ht 5\' 2"  (1.575 m)  Wt 171 lb (77.565 kg)  BMI 31.27 kg/m2  EXAM: General appearance: alert and no distress Neck: no carotid bruit and no JVD  Lungs: clear to auscultation bilaterally Heart: regular rate and rhythm, S1, S2 normal, no murmur, click, rub or gallop Abdomen: soft, non-tender; bowel sounds normal; no masses,  no organomegaly Extremities: extremities normal, atraumatic, no cyanosis or edema Pulses: 2+ and symmetric Skin: Skin color, texture, turgor normal. No rashes or lesions Neurologic: Grossly normal Psych: Pleasant  EKG: Normal sinus rhythm at 82, LBBB  ASSESSMENT: 1. Newly identified left bundle branch block 2. Epilepsy 3. OSA on CPAP 4. Tobacco abuse  PLAN: 1.   [name] [name] was identified as having a new left bundle branch block. She has a history of epilepsy on 2 seizure medications. She denies any chest pain or worsening shortness of breath but is not physically active. She is a long-time  smoker and does have sleep apnea on CPAP. I recommend a Lexiscan nuclear stress test to further evaluate for possible coronary ischemia. She says she had a stress test in 2006 which was negative. There is a family history of coronary disease and her older sister, however she is a diabetic.  Plan to see her back in a few weeks to discuss results. Thanks again for the kind referral.  Pixie Casino, MD, [facility] Attending Cardiologist [place] 10/11/2014, 4:37 PM

## 2014-10-11 NOTE — Patient Instructions (Signed)
Your physician has requested that you have a lexiscan myoview. For further information please visit [email or web address]. Please follow instruction sheet, as given.  Your physician recommends that you schedule a follow-up appointment after your stress test.   

## 2014-10-17 ENCOUNTER — Encounter: Payer: Self-pay | Admitting: Neurology

## 2014-10-17 ENCOUNTER — Telehealth: Payer: Self-pay

## 2014-10-17 ENCOUNTER — Other Ambulatory Visit: Payer: Self-pay | Admitting: Neurology

## 2014-10-17 MED ORDER — LAMOTRIGINE 100 MG PO TABS: 100.0000 mg | ORAL_TABLET | Freq: Two times a day (BID) | ORAL | Status: DC

## 2014-10-17 NOTE — Telephone Encounter (Signed)
I called the patient in response to her [place] email. She stated that [name] told her once she started taking 3 tablets of Lamictal, he would call in a different Rx so she would not have to take so many pills.

## 2014-10-17 NOTE — Telephone Encounter (Signed)
I responded to the patient on my chart. I have called in the prescription for the 100 mg of Lamictal.

## 2014-10-18 ENCOUNTER — Telehealth (HOSPITAL_COMMUNITY): Payer: Self-pay

## 2014-10-18 NOTE — Telephone Encounter (Signed)
Encounter complete. 

## 2014-10-22 ENCOUNTER — Emergency Department (HOSPITAL_COMMUNITY)
Admission: EM | Admit: 2014-10-22 | Discharge: 2014-10-22 | Disposition: A | Discharge status: Home / Self Care | Payer: BLUE CROSS/BLUE SHIELD | Attending: Emergency Medicine | Admitting: Emergency Medicine

## 2014-10-22 ENCOUNTER — Telehealth: Payer: Self-pay | Admitting: Neurology

## 2014-10-22 ENCOUNTER — Emergency Department (HOSPITAL_COMMUNITY): Payer: BLUE CROSS/BLUE SHIELD

## 2014-10-22 DIAGNOSIS — Z8739 Personal history of other diseases of the musculoskeletal system and connective tissue: Secondary | ICD-10-CM | POA: Insufficient documentation

## 2014-10-22 DIAGNOSIS — X58XXXA Exposure to other specified factors, initial encounter: Secondary | ICD-10-CM | POA: Diagnosis not present

## 2014-10-22 DIAGNOSIS — Y999 Unspecified external cause status: Secondary | ICD-10-CM | POA: Insufficient documentation

## 2014-10-22 DIAGNOSIS — Z9981 Dependence on supplemental oxygen: Secondary | ICD-10-CM | POA: Insufficient documentation

## 2014-10-22 DIAGNOSIS — S0003XA Contusion of scalp, initial encounter: Secondary | ICD-10-CM | POA: Insufficient documentation

## 2014-10-22 DIAGNOSIS — G40909 Epilepsy, unspecified, not intractable, without status epilepticus: Secondary | ICD-10-CM | POA: Insufficient documentation

## 2014-10-22 DIAGNOSIS — R569 Unspecified convulsions: Secondary | ICD-10-CM

## 2014-10-22 DIAGNOSIS — K219 Gastro-esophageal reflux disease without esophagitis: Secondary | ICD-10-CM | POA: Diagnosis not present

## 2014-10-22 DIAGNOSIS — G4733 Obstructive sleep apnea (adult) (pediatric): Secondary | ICD-10-CM | POA: Diagnosis not present

## 2014-10-22 DIAGNOSIS — Y939 Activity, unspecified: Secondary | ICD-10-CM | POA: Insufficient documentation

## 2014-10-22 DIAGNOSIS — Z720 Tobacco use: Secondary | ICD-10-CM | POA: Diagnosis not present

## 2014-10-22 DIAGNOSIS — Z8659 Personal history of other mental and behavioral disorders: Secondary | ICD-10-CM | POA: Insufficient documentation

## 2014-10-22 DIAGNOSIS — Z880 Allergy status to penicillin: Secondary | ICD-10-CM | POA: Diagnosis not present

## 2014-10-22 DIAGNOSIS — Z79899 Other long term (current) drug therapy: Secondary | ICD-10-CM | POA: Diagnosis not present

## 2014-10-22 DIAGNOSIS — Y92009 Unspecified place in unspecified non-institutional (private) residence as the place of occurrence of the external cause: Secondary | ICD-10-CM | POA: Insufficient documentation

## 2014-10-22 LAB — CBC WITH DIFFERENTIAL/PLATELET
BASOS ABS: 0 10*3/uL (ref 0.0–0.1)
Basophils Relative: 0 % (ref 0–1)
EOS ABS: 0.1 10*3/uL (ref 0.0–0.7)
EOS PCT: 1 % (ref 0–5)
HCT: 45.4 % (ref 36.0–46.0)
HEMOGLOBIN: 15.2 g/dL — AB (ref 12.0–15.0)
LYMPHS PCT: 29 % (ref 12–46)
Lymphs Abs: 3 10*3/uL (ref 0.7–4.0)
MCH: 31.2 pg (ref 26.0–34.0)
MCHC: 33.5 g/dL (ref 30.0–36.0)
MCV: 93.2 fL (ref 78.0–100.0)
Monocytes Absolute: 0.5 10*3/uL (ref 0.1–1.0)
Monocytes Relative: 4 % (ref 3–12)
Neutro Abs: 6.7 10*3/uL (ref 1.7–7.7)
Neutrophils Relative %: 66 % (ref 43–77)
PLATELETS: 237 10*3/uL (ref 150–400)
RBC: 4.87 MIL/uL (ref 3.87–5.11)
RDW: 13.5 % (ref 11.5–15.5)
WBC: 10.4 10*3/uL (ref 4.0–10.5)

## 2014-10-22 LAB — COMPREHENSIVE METABOLIC PANEL
ALBUMIN: 4 g/dL (ref 3.5–5.0)
ALT: 22 U/L (ref 14–54)
ANION GAP: 13 (ref 5–15)
AST: 32 U/L (ref 15–41)
Alkaline Phosphatase: 162 U/L — ABNORMAL HIGH (ref 38–126)
BUN: 10 mg/dL (ref 6–20)
CO2: 21 mmol/L — ABNORMAL LOW (ref 22–32)
CREATININE: 0.91 mg/dL (ref 0.44–1.00)
Calcium: 8.9 mg/dL (ref 8.9–10.3)
Chloride: 105 mmol/L (ref 101–111)
GFR calc Af Amer: >60 mL/min (ref >60)
GFR calc non Af Amer: >60 mL/min (ref >60)
GLUCOSE: 107 mg/dL — AB (ref 65–99)
POTASSIUM: 3.5 mmol/L (ref 3.5–5.1)
Sodium: 139 mmol/L (ref 135–145)
TOTAL PROTEIN: 7.3 g/dL (ref 6.5–8.1)
Total Bilirubin: 0.3 mg/dL (ref 0.3–1.2)

## 2014-10-22 MED ORDER — LAMOTRIGINE 150 MG PO TABS: 150.0000 mg | ORAL_TABLET | Freq: Two times a day (BID) | ORAL | Status: DC

## 2014-10-22 NOTE — ED Notes (Signed)
Patient is coming in from home via EMS. Pt had a seizure this morning. Unknown of how long and what type of seizure. Last seizure was March. During this seizure patient fell and acquired a know on the occipital part of head. This knot is intermittent and it is present. Also, complains of left upper quad pain with nausea. Pt took a Benadryl before going to bed for sinus. Pt was cool and clammy when EMS arrived but dry, warm when she arrived to facility.

## 2014-10-22 NOTE — Telephone Encounter (Signed)
I called the patient. The patient had recurrent seizure. She has just converted to Lamictal 100 mg twice daily. She will stay on this for 2 weeks, then will go to 100 mg in the morning, 150 mg in the evening. This will be done for 2 weeks, then she will go to 50 mg twice daily.

## 2014-10-22 NOTE — Discharge Instructions (Signed)
Please avoid use of all antihistamines-including Benadryl. All of them can increase your risk of seizures. If ED need to use an antihistamine, consider using either Claritin or Zyrtec which have a lower risk of seizure and other antihistamines.  Please make an appointment with your neurologist to discuss whether ear medication should be adjusted and whether you should have any additional testing done.  Epilepsy Epilepsy is a disorder in which a person has repeated seizures over time. A seizure is a release of abnormal electrical activity in the brain. Seizures can cause a change in attention, behavior, or the ability to remain awake and alert (altered mental status). Seizures often involve uncontrollable shaking (convulsions).  Most people with epilepsy lead normal lives. However, people with epilepsy are at an increased risk of falls, accidents, and injuries. Therefore, it is important to begin treatment right away. CAUSES  Epilepsy has many possible causes. Anything that disturbs the normal pattern of brain cell activity can lead to seizures. This may include:   Head injury.  Birth trauma.  High fever as a child.  Stroke.  Bleeding into or around the brain.  Certain drugs.  Prolonged low oxygen, such as what occurs after CPR efforts.  Abnormal brain development.  Certain illnesses, such as meningitis, encephalitis (brain infection), malaria, and other infections.  An imbalance of nerve signaling chemicals (neurotransmitters).  SIGNS AND SYMPTOMS  The symptoms of a seizure can vary greatly from one person to another. Right before a seizure, you may have a warning (aura) that a seizure is about to occur. An aura may include the following symptoms:  Fear or anxiety.  Nausea.  Feeling like the room is spinning (vertigo).  Vision changes, such as seeing flashing lights or spots. Common symptoms during a seizure include:  Abnormal sensations, such as an abnormal smell or a  bitter taste in the mouth.   Sudden, general body stiffness.   Convulsions that involve rhythmic jerking of the face, arm, or leg on one or both sides.   Sudden change in consciousness.   Appearing to be awake but not responding.   Appearing to be asleep but cannot be awakened.   Grimacing, chewing, lip smacking, drooling, tongue biting, or loss of bowel or bladder control. After a seizure, you may feel sleepy for a while. DIAGNOSIS  Your health care provider will ask about your symptoms and take a medical history. Descriptions from any witnesses to your seizures will be very helpful in the diagnosis. A physical exam, including a detailed neurological exam, is necessary. Various tests may be done, such as:   An electroencephalogram (EEG). This is a painless test of your brain waves. In this test, a diagram is created of your brain waves. These diagrams can be interpreted by a specialist.  An MRI of the brain.   A CT scan of the brain.   A spinal tap (lumbar puncture, LP).  Blood tests to check for signs of infection or abnormal blood chemistry. TREATMENT  There is no cure for epilepsy, but it is generally treatable. Once epilepsy is diagnosed, it is important to begin treatment as soon as possible. For most people with epilepsy, seizures can be controlled with medicines. The following may also be used:  A pacemaker for the brain (vagus nerve stimulator) can be used for people with seizures that are not well controlled by medicine.  Surgery on the brain. For some people, epilepsy eventually goes away. HOME CARE INSTRUCTIONS   Follow your health care provider's recommendations  on driving and safety in normal activities.  Get enough rest. Lack of sleep can cause seizures.  Only take over-the-counter or prescription medicines as directed by your health care provider. Take any prescribed medicine exactly as directed.  Avoid any known triggers of your seizures.  Keep a  seizure diary. Record what you recall about any seizure, especially any possible trigger.   Make sure the people you live and work with know that you are prone to seizures. They should receive instructions on how to help you. In general, a witness to a seizure should:   Cushion your head and body.   Turn you on your side.   Avoid unnecessarily restraining you.   Not place anything inside your mouth.   Call for emergency medical help if there is any question about what has occurred.   Follow up with your health care provider as directed. You may need regular blood tests to monitor the levels of your medicine.  SEEK MEDICAL CARE IF:   You develop signs of infection or other illness. This might increase the risk of a seizure.   You seem to be having more frequent seizures.   Your seizure pattern is changing.  SEEK IMMEDIATE MEDICAL CARE IF:   You have a seizure that does not stop after a few moments.   You have a seizure that causes any difficulty in breathing.   You have a seizure that results in a very severe headache.   You have a seizure that leaves you with the inability to speak or use a part of your body.  Document Released: 05/25/2005 Document Revised: 03/15/2013 Document Reviewed: 01/04/2013 [facility] Patient Information 2015 [place], [place]. This information is not intended to replace advice given to you by your health care provider. Make sure you discuss any questions you have with your health care provider.

## 2014-10-22 NOTE — ED Notes (Signed)
Bed: YP49 Expected date: 10/22/14 Expected time: 3:24 AM Means of arrival: Ambulance Comments: seizures

## 2014-10-22 NOTE — ED Notes (Signed)
Pt in CT.

## 2014-10-22 NOTE — ED Provider Notes (Signed)
CSN: [id number]     Arrival date & time 10/22/14  8889 History   First MD Initiated Contact with Patient 10/22/14 0345     Chief Complaint  Patient presents with  . Seizures     (Consider location/radiation/quality/duration/timing/severity/associated sxs/prior Treatment) Patient is a 54 y.o. female presenting with seizures. The history is provided by the patient and a relative.  Seizures She has a history of seizure disorder and her sister found her in bed with a seizure that was longer than her previous seizures and different in character. She was described as being stiff as a board and gurgling like she was choking to the point where her sister had to hold her mouth 2 keep her from choking. Seizure lasted about 3 minutes with approximately 10-15 minute postictal phase. There is no bowel or bladder incontinence and no bit lip or tongue. She had apparently taken diphenhydramine before going to bed for sinus problems. She also has recently had seen her neurologist to has been titrating up a new seizure medication-lamotrigine-which she is now taking 100 mg twice a day. She is also complaining of a knot on the back of her head from a seizure that occurred about 6 weeks ago and that bump is still painful. It is present in the left occipital area.  Past Medical History  Diagnosis Date  . Seizures   . Diverticulitis   . GERD (gastroesophageal reflux disease)   . Hiatal hernia   . Wrist tendonitis     R   . Panic disorder 09/07/2014  . OSA on CPAP    Past Surgical History  Procedure Laterality Date  . Tubal ligation    . Hand surgery     Family History  Problem Relation Age of Onset  . Kidney failure Sister   . Diabetes Sister   . Alzheimer's disease Mother   . Hypertension Mother   . Hypertension Father   . Stroke Father   . Coronary artery disease Maternal Grandfather   . Parkinson's disease Maternal Grandmother   . Stroke Brother   . Hypertension Brother   . Sleep apnea Brother     History  Substance Use Topics  . Smoking status: Current Every Day Smoker -- 1.00 packs/day for 35 years    Types: Cigarettes  . Smokeless tobacco: Never Used  . Alcohol Use: No     Comment: Quit 12/06/2009   OB History    No data available     Review of Systems  Neurological: Positive for seizures.  All other systems reviewed and are negative.     Allergies  Zonisamide; Aspirin; Ciprofloxacin; Codeine; and Penicillins  Home Medications   Prior to Admission medications   Medication Sig Start Date End Date Taking? Authorizing Provider  lamoTRIgine (LAMICTAL) 100 MG tablet Take 1 tablet (100 mg total) by mouth 2 (two) times daily. 10/17/14   Kathrynn Ducking, MD  NON FORMULARY at bedtime. CPAP    Historical Provider, MD  omeprazole (PRILOSEC) 20 MG capsule Take 20 mg by mouth daily.    Historical Provider, MD  oxcarbazepine (TRILEPTAL) 600 MG tablet Take 1 tablet (600 mg total) by mouth 2 (two) times daily. 09/10/14   Kathrynn Ducking, MD   BP 144/85 mmHg  Pulse 89  Temp(Src) 97.7 F (36.5 C) (Oral)  Resp 21  SpO2 95% Physical Exam  Nursing note and vitals reviewed.  54 year old female, resting comfortably and in no acute distress. Vital signs are significant for borderline hypertension,  and tachypnea. Oxygen saturation is 95%, which is normal. Head is normocephalic. There is a small swollen area in the left occipital area which is tender. There is no fluctuance.Marland Kitchen PERRLA, EOMI. Oropharynx is clear. Neck is nontender and supple without adenopathy or JVD. Back is nontender and there is no CVA tenderness. Lungs are clear without rales, wheezes, or rhonchi. Chest is nontender. Heart has regular rate and rhythm without murmur. Abdomen is soft, flat, nontender without masses or hepatosplenomegaly and peristalsis is normoactive. Extremities have no cyanosis or edema, full range of motion is present. Skin is warm and dry without rash. Neurologic: Mental status is normal,  cranial nerves are intact, there are no motor or sensory deficits.  ED Course  Procedures (including critical care time) Labs Review Results for orders placed or performed during the hospital encounter of 10/22/14  Comprehensive metabolic panel  Result Value Ref Range   Sodium 139 135 - 145 mmol/L   Potassium 3.5 3.5 - 5.1 mmol/L   Chloride 105 101 - 111 mmol/L   CO2 21 (L) 22 - 32 mmol/L   Glucose, Bld 107 (H) 65 - 99 mg/dL   BUN 10 6 - 20 mg/dL   Creatinine, Ser 0.91 0.44 - 1.00 mg/dL   Calcium 8.9 8.9 - 10.3 mg/dL   Total Protein 7.3 6.5 - 8.1 g/dL   Albumin 4.0 3.5 - 5.0 g/dL   AST 32 15 - 41 U/L   ALT 22 14 - 54 U/L   Alkaline Phosphatase 162 (H) 38 - 126 U/L   Total Bilirubin 0.3 0.3 - 1.2 mg/dL   GFR calc non Af Amer >60 >60 mL/min   GFR calc Af Amer >60 >60 mL/min   Anion gap 13 5 - 15  CBC with Differential  Result Value Ref Range   WBC 10.4 4.0 - 10.5 K/uL   RBC 4.87 3.87 - 5.11 MIL/uL   Hemoglobin 15.2 (H) 12.0 - 15.0 g/dL   HCT 45.4 36.0 - 46.0 %   MCV 93.2 78.0 - 100.0 fL   MCH 31.2 26.0 - 34.0 pg   MCHC 33.5 30.0 - 36.0 g/dL   RDW 13.5 11.5 - 15.5 %   Platelets 237 150 - 400 K/uL   Neutrophils Relative % 66 43 - 77 %   Neutro Abs 6.7 1.7 - 7.7 K/uL   Lymphocytes Relative 29 12 - 46 %   Lymphs Abs 3.0 0.7 - 4.0 K/uL   Monocytes Relative 4 3 - 12 %   Monocytes Absolute 0.5 0.1 - 1.0 K/uL   Eosinophils Relative 1 0 - 5 %   Eosinophils Absolute 0.1 0.0 - 0.7 K/uL   Basophils Relative 0 0 - 1 %   Basophils Absolute 0.0 0.0 - 0.1 K/uL   Imaging Review Ct Head Wo Contrast  10/22/2014   CLINICAL DATA:  Seizure this morning.  EXAM: CT HEAD WITHOUT CONTRAST  TECHNIQUE: Contiguous axial images were obtained from the base of the skull through the vertex without intravenous contrast.  COMPARISON:  Brain MRI 08/24/2013, head CT 01/21/2012  FINDINGS: No intracranial hemorrhage, mass effect, or midline shift. No hydrocephalus. The basilar cisterns are patent. No  evidence of territorial infarct. No intracranial fluid collection. Small left occipital scalp hematoma without associated fracture. Calvarium is intact. Included paranasal sinuses and mastoid air cells are well aerated.  IMPRESSION: Small left occipital scalp hematoma without fracture or acute intracranial abnormality.   Electronically Signed   By: Jeb Levering M.D.   On:  10/22/2014 04:50    Images viewed by me.   EKG Interpretation   Date/Time:  Monday Oct 22 2014 03:43:13 EDT Ventricular Rate:  87 PR Interval:  202 QRS Duration: 137 QT Interval:  440 QTC Calculation: 529 R Axis:   59 Text Interpretation:  Sinus rhythm Borderline prolonged PR interval Left  bundle branch block No old tracing to compare Confirmed by [facility]  MD,  Doc Mandala ([id number]) on 10/22/2014 3:45:58 AM      MDM   Final diagnoses:  Seizure  Scalp hematoma, initial encounter    Seizure in patient with difficult to control seizure disorder. Current seizure may been precipitated by use of antihistamines. Old records reviewed. She has recently seen a cardiologist because of new left bundle branch block and workup is in progress. She recently saw her neurologist who started her on lamotrigine with intent to titrate dose up to 100 mg twice a day. She is at that current dose. She is not on any anticonvulsants that have blood levels available quickly. Family is very concerned about the lump on her head and she will get CT scan. Screening labs will be obtained but I have explained to the patient family that it is very unlikely to find any explanation with this workup.  Workup is unremarkable. Scalp hematoma corresponds with her area of swelling and tenderness and does not require any treatment. Patient is advised to avoid use of antihistamines, and, if she absolutely needs to take use an antihistamine, use one of the nonsedating antihistamines which have lower risk of precipitating seizures.  She is scheduled for a nuclear  stress test in 2 days and wanted to know if she needed to change that appointment. She is advised that the seizure tonight should not impact her ability to safely have the stress test.  Delora Fuel, MD 93/81/01 [id number]

## 2014-10-22 NOTE — ED Notes (Signed)
Pt returned from CT °

## 2014-10-22 NOTE — Telephone Encounter (Signed)
Patient called wanting to speak with [name] regarding the seizure she had this morning at 2AM. Please call and advise. Patient can be reached @ [phone]

## 2014-10-22 NOTE — Telephone Encounter (Signed)
I called the patient. She had a seizure last night. She went to the ED. They told her she had a "knot" on the CT that she should let her neurologist know about. She also stated that she had taken Benadryl last night and the ED doctor told her to take claritin if she needed something for allergies. She wanted to let [name] know about all of this and would like to speak to him once he is back in the office tomorrow (5/17). She stated she would be available after 10:30 AM.

## 2014-10-22 NOTE — ED Notes (Signed)
Pt alert,oriented, and ambulatory upon DC. She was advised to follow up with neuro and PCP.

## 2014-10-22 NOTE — ED Notes (Signed)
MD Glick at bedside. 

## 2014-10-23 ENCOUNTER — Ambulatory Visit (HOSPITAL_COMMUNITY)
Admission: RE | Admit: 2014-10-23 | Discharge: 2014-10-23 | Disposition: A | Discharge status: Home / Self Care | Payer: BLUE CROSS/BLUE SHIELD | Source: Ambulatory Visit | Attending: Cardiovascular Disease | Admitting: Cardiovascular Disease

## 2014-10-23 DIAGNOSIS — I4510 Unspecified right bundle-branch block: Secondary | ICD-10-CM

## 2014-10-23 DIAGNOSIS — Z8249 Family history of ischemic heart disease and other diseases of the circulatory system: Secondary | ICD-10-CM | POA: Diagnosis not present

## 2014-10-23 DIAGNOSIS — I447 Left bundle-branch block, unspecified: Secondary | ICD-10-CM | POA: Diagnosis not present

## 2014-10-23 LAB — MYOCARDIAL PERFUSION IMAGING
CHL CUP NUCLEAR SSS: 4
CHL CUP STRESS STAGE 1 HR: 75 {beats}/min
CHL CUP STRESS STAGE 1 SPEED: 0 mph
CHL CUP STRESS STAGE 2 GRADE: 0 %
CHL CUP STRESS STAGE 3 SBP: 170 mmHg
CHL CUP STRESS STAGE 4 DBP: 91 mmHg
CHL CUP STRESS STAGE 4 SPEED: 0 mph
CSEPPBP: 170 mmHg
CSEPPHR: 103 {beats}/min
CSEPPMHR: 62 %
Estimated workload: 1 METS
LV dias vol: 82 mL
LV sys vol: 34 mL
Nuc Stress EF: 58 %
Rest HR: 76 {beats}/min
SDS: 0
SRS: 4
Stage 1 DBP: 89 mmHg
Stage 1 Grade: 0 %
Stage 1 SBP: 163 mmHg
Stage 2 HR: 75 {beats}/min
Stage 2 Speed: 0 mph
Stage 3 DBP: 87 mmHg
Stage 3 Grade: 0 %
Stage 3 HR: 103 {beats}/min
Stage 3 Speed: 0 mph
Stage 4 Grade: 0 %
Stage 4 HR: 93 {beats}/min
Stage 4 SBP: 179 mmHg
TID: 1.24

## 2014-10-23 MED ORDER — REGADENOSON 0.4 MG/5ML IV SOLN
0.4000 mg | Freq: Once | INTRAVENOUS | Status: AC
  Administered 2014-10-23: 0.4 mg via INTRAVENOUS

## 2014-10-23 MED ORDER — TECHNETIUM TC 99M SESTAMIBI GENERIC - CARDIOLITE
30.8000 | Freq: Once | INTRAVENOUS | Status: AC | PRN
Start: 2014-10-23 — End: 2014-10-23
  Administered 2014-10-23: 30.8 via INTRAVENOUS

## 2014-10-23 MED ORDER — TECHNETIUM TC 99M SESTAMIBI GENERIC - CARDIOLITE
10.5000 | Freq: Once | INTRAVENOUS | Status: AC | PRN
  Administered 2014-10-23: 11 via INTRAVENOUS

## 2014-10-27 ENCOUNTER — Encounter: Payer: Self-pay | Admitting: Neurology

## 2014-10-29 ENCOUNTER — Encounter: Payer: Self-pay | Admitting: Neurology

## 2014-10-29 ENCOUNTER — Telehealth: Payer: Self-pay

## 2014-10-29 NOTE — Telephone Encounter (Signed)
I spoke with Dr. Rexene Alberts about the previous message. She stated it was too soon to determine if the medication should be changed or if the patient's body just had not adjusted to the increased dose yet. She recommended the patient continue with the prescribed dose and call our office if the symptoms do not begin to get better soon.

## 2014-10-29 NOTE — Telephone Encounter (Signed)
I called the patient in response to her MyChart message. She stated that she started taking 150 mg of Lamictal, per [name], last Tuesday and since then has had trouble with her balance. She states that she feels drunk when she walks. She has not had any falls. She had a seizure last night (per another MyChart message). Is there anything she can change with her medications?

## 2014-10-29 NOTE — Telephone Encounter (Signed)
Addressed in patient email from 5/21.

## 2014-10-29 NOTE — Telephone Encounter (Signed)
Appointment scheduled.

## 2014-10-29 NOTE — Telephone Encounter (Signed)
[name] wants to see the patient soon. I called the patient and offered her 10:30 on Thursday 5/26. She is going to call her ride and call our office right back to schedule.

## 2014-10-30 ENCOUNTER — Telehealth: Payer: Self-pay | Admitting: Neurology

## 2014-10-30 NOTE — Telephone Encounter (Signed)
Pt called  And requested to speak with Charisse Klinefelter RN she stated that she had a seizure around a week ago (she thinks it was 5/16). She broke her tooth during the seizure and is having trouble finding a dentist that will treat her. She is in extreme pain and wants to know what medications are ok for her to take. Please call and advise.

## 2014-10-30 NOTE — Telephone Encounter (Signed)
I called the patient. Per [name], the patient can take an OTC analgesic for this pain. She verbalized understanding.

## 2014-10-31 ENCOUNTER — Encounter: Payer: Self-pay | Admitting: Internal Medicine

## 2014-10-31 ENCOUNTER — Ambulatory Visit (INDEPENDENT_AMBULATORY_CARE_PROVIDER_SITE_OTHER): Payer: BLUE CROSS/BLUE SHIELD | Admitting: Internal Medicine

## 2014-10-31 VITALS — BP 138/70 | HR 76 | Ht 62.0 in | Wt 172.6 lb

## 2014-10-31 DIAGNOSIS — I447 Left bundle-branch block, unspecified: Secondary | ICD-10-CM | POA: Diagnosis not present

## 2014-10-31 DIAGNOSIS — Z720 Tobacco use: Secondary | ICD-10-CM

## 2014-10-31 NOTE — Progress Notes (Signed)
OFFICE NOTE  Chief Complaint:  Follow-up stress test  Primary Care Physician: Milagros Evener, MD  HPI:  [name] [name] is a pleasant 54 year old female is coming referred to me from Dr. Zadie Rhine for evaluation of an abnormal EKG. Her past medical history significant for epilepsy which was recently diagnosed and while she was at [facility] her EKG was abnormal indicating a left bundle branch block. She's followed locally by Dr. Jannifer [name] who is been adjusting her epileptic medications. She was also found to have obstructive sleep apnea is on CPAP. She is a smoker and is been a pack per day smoker for 35 years. There is also a family history of coronary disease in her sister who has 3 prior stents but is has type 1 diabetes for over 30 years. She denies any chest pain or worsening shortness of breath. She is not significantly physically active due to her seizures.  I saw [name] back in the office today. She underwent a nuclear stress test which is negative for ischemia. EF was 58%. She denies any chest pain.  PMHx:  Past Medical History  Diagnosis Date  . Seizures   . Diverticulitis   . GERD (gastroesophageal reflux disease)   . Hiatal hernia   . Wrist tendonitis     R   . Panic disorder 09/07/2014  . OSA on CPAP     Past Surgical History  Procedure Laterality Date  . Tubal ligation    . Hand surgery      FAMHx:  Family History  Problem Relation Age of Onset  . Kidney failure Sister   . Diabetes Sister   . Alzheimer's disease Mother   . Hypertension Mother   . Hypertension Father   . Stroke Father   . Coronary artery disease Maternal Grandfather   . Parkinson's disease Maternal Grandmother   . Stroke Brother   . Hypertension Brother   . Sleep apnea Brother     SOCHx:   reports that she has been smoking Cigarettes.  She has a 35 pack-year smoking history. She has never used smokeless tobacco. She reports that she does not drink alcohol or use illicit  drugs.  ALLERGIES:  Allergies  Allergen Reactions  . Zonisamide Anxiety    Panic Attacks  . Aspirin     sensitivity  . Ciprofloxacin     Seizures & it keeps her awake  . Codeine Hives  . Penicillins Hives    ROS: A comprehensive review of systems was negative except for: Neurological: positive for seizures  HOME MEDS: Current Outpatient Prescriptions  Medication Sig Dispense Refill  . lamoTRIgine (LAMICTAL) 150 MG tablet Take 1 tablet (150 mg total) by mouth 2 (two) times daily. 60 tablet 3  . NON FORMULARY at bedtime. CPAP    . omeprazole (PRILOSEC) 20 MG capsule Take 20 mg by mouth daily.    Marland Kitchen oxcarbazepine (TRILEPTAL) 600 MG tablet Take 1 tablet (600 mg total) by mouth 2 (two) times daily. 180 tablet 1   No current facility-administered medications for this visit.    LABS/IMAGING: No results found for this or any previous visit (from the past 48 hour(s)). No results found.  WEIGHTS: Wt Readings from Last 3 Encounters:  10/31/14 172 lb 9.6 oz (78.291 kg)  10/23/14 171 lb (77.565 kg)  10/11/14 171 lb (77.565 kg)    VITALS: BP 138/70 mmHg  Pulse 76  Ht 5\' 2"  (1.575 m)  Wt 172 lb 9.6 oz (78.291 kg)  BMI 31.56  kg/m2  EXAM: Deferred  EKG: Deferred  ASSESSMENT: 1. Newly identified left bundle branch block - negative nuclear stress test with EF 58% 2. Epilepsy 3. OSA on CPAP 4. Tobacco abuse  PLAN: 1.   Ms. Higham has a low risk stress test which is reassuring. Her bundle branch block was newly identified and may represent some degree of conduction system disease but there is no evidence of cardiomyopathy associated with it. I recommend continued risk factor modification and tobacco cessation. She should continue to use her CPAP. Unfortunately she continues to have problems with seizures and is going back to see her neurologist fairly soon. Follow-up with me as needed.  Pixie Casino, MD, [facility] Attending Cardiologist [place] 10/31/2014, 3:26 PM

## 2014-10-31 NOTE — Patient Instructions (Signed)
Your physician recommends that you schedule a follow-up appointment as needed  

## 2014-11-01 ENCOUNTER — Encounter: Payer: Self-pay | Admitting: Neurology

## 2014-11-01 ENCOUNTER — Ambulatory Visit (INDEPENDENT_AMBULATORY_CARE_PROVIDER_SITE_OTHER): Payer: BLUE CROSS/BLUE SHIELD | Admitting: Neurology

## 2014-11-01 VITALS — BP 125/76 | HR 90 | Ht 62.0 in | Wt 172.6 lb

## 2014-11-01 DIAGNOSIS — R569 Unspecified convulsions: Secondary | ICD-10-CM

## 2014-11-01 DIAGNOSIS — Z5181 Encounter for therapeutic drug level monitoring: Secondary | ICD-10-CM | POA: Diagnosis not present

## 2014-11-01 NOTE — Patient Instructions (Signed)

## 2014-11-01 NOTE — Progress Notes (Signed)
Reason for visit:  seizures  [name] [name] is an 54 y.o. female  History of present illness:   [name] [name] is a 54 year old left-handed white female with a history of intractable epilepsy. The patient is averaging 2 or 3 seizures a month. The patient has been on Trileptal, she has run good blood levels with 600 mg twice daily dosing. She has been placed on Lamictal after it was felt that she could not tolerate the Zonegran secondary to panic attacks. She has had no further panic events, and she is on 150 mg twice daily of Lamictal. She indicates that within 2 hours after the dosing of Lamictal, she will feel drunk and staggery. She has not had any falls. The last seizure was approximately 3 or 4 days prior to this evaluation. The last 2 seizures have occurred at night, the episode 2 weeks ago was prolonged, lasting greater than 4 minutes. The patient went to the hospital for this event. The patient was taking Benadryl in the evening hours for allergy symptoms. She was told not to continue using this medication. She returns for an evaluation.  Past Medical History  Diagnosis Date  . Seizures   . Diverticulitis   . GERD (gastroesophageal reflux disease)   . Hiatal hernia   . Wrist tendonitis     R   . Panic disorder 09/07/2014  . OSA on CPAP     Past Surgical History  Procedure Laterality Date  . Tubal ligation    . Hand surgery      Family History  Problem Relation Age of Onset  . Kidney failure Sister   . Diabetes Sister   . Alzheimer's disease Mother   . Hypertension Mother   . Hypertension Father   . Stroke Father   . Coronary artery disease Maternal Grandfather   . Parkinson's disease Maternal Grandmother   . Stroke Brother   . Hypertension Brother   . Sleep apnea Brother     Social history:  reports that she has been smoking Cigarettes.  She has a 35 pack-year smoking history. She has never used smokeless tobacco. She reports that she does not drink alcohol or  use illicit drugs.    Allergies  Allergen Reactions  . Zonisamide Anxiety    Panic Attacks  . Aspirin     sensitivity  . Ciprofloxacin     Seizures & it keeps her awake  . Codeine Hives  . Penicillins Hives    Medications:  Prior to Admission medications   Medication Sig Start Date End Date Taking? Authorizing Provider  lamoTRIgine (LAMICTAL) 150 MG tablet Take 1 tablet (150 mg total) by mouth 2 (two) times daily. 10/22/14  Yes Kathrynn Ducking, MD  NON FORMULARY at bedtime. CPAP   Yes Historical Provider, MD  omeprazole (PRILOSEC) 20 MG capsule Take 20 mg by mouth daily.   Yes Historical Provider, MD  oxcarbazepine (TRILEPTAL) 600 MG tablet Take 1 tablet (600 mg total) by mouth 2 (two) times daily. 09/10/14  Yes Kathrynn Ducking, MD    ROS:  Out of a complete 14 system review of symptoms, the patient complains only of the following symptoms, and all other reviewed systems are negative.   Environmental allergies  Joint pain  Seizures  Blood pressure 125/76, pulse 90, height 5\' 2"  (1.575 m), weight 172 lb 9.6 oz (78.291 kg).  Physical Exam  General: The patient is alert and cooperative at the time of the examination. The patient is moderately  obese.  Skin: No significant peripheral edema is noted.   Neurologic Exam  Mental status: The patient is alert and oriented x 3 at the time of the examination. The patient has apparent normal recent and remote memory, with an apparently normal attention span and concentration ability.   Cranial nerves: Facial symmetry is present. Speech is normal, no aphasia or dysarthria is noted. Extraocular movements are full. Visual fields are full.  Motor: The patient has good strength in all 4 extremities.  Sensory examination: Soft touch sensation is symmetric on the face, arms, and legs.  Coordination: The patient has good finger-nose-finger and heel-to-shin bilaterally.  Gait and station: The patient has a normal gait. Tandem gait is  normal. Romberg is negative. No drift is seen.  Reflexes: Deep tendon reflexes are symmetric.   Assessment/Plan:   1. Intractable epilepsy    The patient has had a video EEG monitoring study previously, but no seizures were noted during the monitoring period. Blood levels of the lamotrigine and Trileptal will be done today. We will try to continue increasing the dose of the lamotrigine if possible. If the seizures cannot be controlled, another video EEG monitoring study needs to be done to consider the patient for possible epilepsy surgery. MRI of the brain questions the possibility of mesial temporal sclerosis on the right. She will follow-up otherwise in 3-4 months.  Jill Alexanders MD 11/01/2014 7:45 PM  [facility] [address] [place], [place] [address]  Phone [phone] Fax [phone]

## 2014-11-04 LAB — 10-HYDROXYCARBAZEPINE: OXCARBAZEPINE SERPL-MCNC: 33 ug/mL (ref 10–35)

## 2014-11-04 LAB — LAMOTRIGINE LEVEL: LAMOTRIGINE LVL: 6.9 ug/mL (ref 2.0–20.0)

## 2014-11-05 ENCOUNTER — Telehealth: Payer: Self-pay | Admitting: Neurology

## 2014-11-05 MED ORDER — LAMOTRIGINE ER 200 MG PO TB24: 400.0000 mg | ORAL_TABLET | Freq: Every day | ORAL | Status: DC

## 2014-11-05 NOTE — Telephone Encounter (Signed)
I called the patient. The blood work is ok, good trileptal and lamictal levels, but do have room to push up the lamictal dose, want to get to 200 mg bid, patient to call if ok with this.

## 2014-11-05 NOTE — Telephone Encounter (Signed)
I talked with the patient. We will switch to the long acting Lamictal and increase the dose to 400 mg qhs

## 2014-11-12 DIAGNOSIS — Z0289 Encounter for other administrative examinations: Secondary | ICD-10-CM

## 2014-11-13 ENCOUNTER — Encounter: Payer: Self-pay | Admitting: Neurology

## 2014-12-10 ENCOUNTER — Encounter: Payer: Self-pay | Admitting: Neurology

## 2014-12-11 ENCOUNTER — Other Ambulatory Visit: Payer: Self-pay | Admitting: Neurology

## 2014-12-11 DIAGNOSIS — Z5181 Encounter for therapeutic drug level monitoring: Secondary | ICD-10-CM

## 2014-12-12 ENCOUNTER — Inpatient Hospital Stay (HOSPITAL_COMMUNITY): Payer: BLUE CROSS/BLUE SHIELD

## 2014-12-12 ENCOUNTER — Emergency Department (HOSPITAL_COMMUNITY): Payer: BLUE CROSS/BLUE SHIELD

## 2014-12-12 ENCOUNTER — Encounter (HOSPITAL_COMMUNITY): Payer: Self-pay | Admitting: Emergency Medicine

## 2014-12-12 ENCOUNTER — Inpatient Hospital Stay (HOSPITAL_COMMUNITY)
Admission: EM | Admit: 2014-12-12 | Discharge: 2014-12-17 | DRG: 242 | Disposition: A | Discharge status: Home / Self Care | Payer: BLUE CROSS/BLUE SHIELD | Attending: Internal Medicine | Admitting: Internal Medicine

## 2014-12-12 DIAGNOSIS — E872 Acidosis: Secondary | ICD-10-CM | POA: Diagnosis present

## 2014-12-12 DIAGNOSIS — R4182 Altered mental status, unspecified: Secondary | ICD-10-CM

## 2014-12-12 DIAGNOSIS — G40409 Other generalized epilepsy and epileptic syndromes, not intractable, without status epilepticus: Secondary | ICD-10-CM | POA: Diagnosis present

## 2014-12-12 DIAGNOSIS — I442 Atrioventricular block, complete: Principal | ICD-10-CM

## 2014-12-12 DIAGNOSIS — Z4659 Encounter for fitting and adjustment of other gastrointestinal appliance and device: Secondary | ICD-10-CM

## 2014-12-12 DIAGNOSIS — R569 Unspecified convulsions: Secondary | ICD-10-CM

## 2014-12-12 DIAGNOSIS — J9601 Acute respiratory failure with hypoxia: Secondary | ICD-10-CM | POA: Diagnosis present

## 2014-12-12 DIAGNOSIS — R231 Pallor: Secondary | ICD-10-CM | POA: Diagnosis not present

## 2014-12-12 DIAGNOSIS — G4733 Obstructive sleep apnea (adult) (pediatric): Secondary | ICD-10-CM | POA: Diagnosis present

## 2014-12-12 DIAGNOSIS — F17210 Nicotine dependence, cigarettes, uncomplicated: Secondary | ICD-10-CM | POA: Diagnosis present

## 2014-12-12 DIAGNOSIS — R579 Shock, unspecified: Secondary | ICD-10-CM | POA: Diagnosis present

## 2014-12-12 DIAGNOSIS — Z959 Presence of cardiac and vascular implant and graft, unspecified: Secondary | ICD-10-CM

## 2014-12-12 DIAGNOSIS — R570 Cardiogenic shock: Secondary | ICD-10-CM | POA: Diagnosis present

## 2014-12-12 DIAGNOSIS — R0600 Dyspnea, unspecified: Secondary | ICD-10-CM | POA: Diagnosis not present

## 2014-12-12 DIAGNOSIS — D72829 Elevated white blood cell count, unspecified: Secondary | ICD-10-CM | POA: Diagnosis present

## 2014-12-12 DIAGNOSIS — Z79899 Other long term (current) drug therapy: Secondary | ICD-10-CM

## 2014-12-12 DIAGNOSIS — R609 Edema, unspecified: Secondary | ICD-10-CM

## 2014-12-12 DIAGNOSIS — I447 Left bundle-branch block, unspecified: Secondary | ICD-10-CM | POA: Diagnosis present

## 2014-12-12 DIAGNOSIS — M7989 Other specified soft tissue disorders: Secondary | ICD-10-CM

## 2014-12-12 DIAGNOSIS — R238 Other skin changes: Secondary | ICD-10-CM | POA: Diagnosis not present

## 2014-12-12 DIAGNOSIS — J9600 Acute respiratory failure, unspecified whether with hypoxia or hypercapnia: Secondary | ICD-10-CM | POA: Diagnosis not present

## 2014-12-12 DIAGNOSIS — K219 Gastro-esophageal reflux disease without esophagitis: Secondary | ICD-10-CM | POA: Diagnosis present

## 2014-12-12 DIAGNOSIS — G40909 Epilepsy, unspecified, not intractable, without status epilepticus: Secondary | ICD-10-CM

## 2014-12-12 DIAGNOSIS — N179 Acute kidney failure, unspecified: Secondary | ICD-10-CM

## 2014-12-12 DIAGNOSIS — I313 Pericardial effusion (noninflammatory): Secondary | ICD-10-CM | POA: Diagnosis not present

## 2014-12-12 DIAGNOSIS — I10 Essential (primary) hypertension: Secondary | ICD-10-CM | POA: Diagnosis present

## 2014-12-12 DIAGNOSIS — R001 Bradycardia, unspecified: Secondary | ICD-10-CM

## 2014-12-12 DIAGNOSIS — R739 Hyperglycemia, unspecified: Secondary | ICD-10-CM | POA: Diagnosis present

## 2014-12-12 HISTORY — DX: Left bundle-branch block, unspecified: I44.7

## 2014-12-12 LAB — GLUCOSE, CAPILLARY
GLUCOSE-CAPILLARY: 103 mg/dL — AB (ref 65–99)
Glucose-Capillary: 116 mg/dL — ABNORMAL HIGH (ref 65–99)
Glucose-Capillary: 104 mg/dL — ABNORMAL HIGH (ref 65–99)
Glucose-Capillary: 116 mg/dL — ABNORMAL HIGH (ref 65–99)

## 2014-12-12 LAB — I-STAT CHEM 8, ED
BUN: 11 mg/dL (ref 6–20)
CHLORIDE: 103 mmol/L (ref 101–111)
CREATININE: 0.9 mg/dL (ref 0.44–1.00)
Calcium, Ion: 1.03 mmol/L — ABNORMAL LOW (ref 1.12–1.23)
GLUCOSE: 310 mg/dL — AB (ref 65–99)
HCT: 41 % (ref 36.0–46.0)
Hemoglobin: 13.9 g/dL (ref 12.0–15.0)
Potassium: 4.4 mmol/L (ref 3.5–5.1)
Sodium: 135 mmol/L (ref 135–145)
TCO2: 20 mmol/L (ref 0–100)

## 2014-12-12 LAB — URINALYSIS, ROUTINE W REFLEX MICROSCOPIC
BILIRUBIN URINE: NEGATIVE
GLUCOSE, UA: 500 mg/dL — AB
KETONES UR: NEGATIVE mg/dL
Leukocytes, UA: NEGATIVE
Nitrite: NEGATIVE
Specific Gravity, Urine: 1.015 (ref 1.005–1.030)
UROBILINOGEN UA: 1 mg/dL (ref 0.0–1.0)
pH: 6.5 (ref 5.0–8.0)

## 2014-12-12 LAB — COMPREHENSIVE METABOLIC PANEL
ALK PHOS: 146 U/L — AB (ref 38–126)
ALT: 39 U/L (ref 14–54)
AST: 66 U/L — ABNORMAL HIGH (ref 15–41)
Albumin: 3 g/dL — ABNORMAL LOW (ref 3.5–5.0)
Anion gap: 14 (ref 5–15)
BUN: 8 mg/dL (ref 6–20)
CHLORIDE: 103 mmol/L (ref 101–111)
CO2: 18 mmol/L — ABNORMAL LOW (ref 22–32)
Calcium: 7.6 mg/dL — ABNORMAL LOW (ref 8.9–10.3)
Creatinine, Ser: 1.26 mg/dL — ABNORMAL HIGH (ref 0.44–1.00)
GFR calc Af Amer: 55 mL/min — ABNORMAL LOW (ref >60)
GFR calc non Af Amer: 47 mL/min — ABNORMAL LOW (ref >60)
GLUCOSE: 334 mg/dL — AB (ref 65–99)
POTASSIUM: 3.5 mmol/L (ref 3.5–5.1)
SODIUM: 135 mmol/L (ref 135–145)
TOTAL PROTEIN: 5.4 g/dL — AB (ref 6.5–8.1)
Total Bilirubin: 0.7 mg/dL (ref 0.3–1.2)

## 2014-12-12 LAB — I-STAT ARTERIAL BLOOD GAS, ED
Acid-base deficit: 10 mmol/L — ABNORMAL HIGH (ref 0.0–2.0)
BICARBONATE: 18.2 meq/L — AB (ref 20.0–24.0)
O2 Saturation: 87 %
PO2 ART: 65 mmHg — AB (ref 80.0–100.0)
TCO2: 20 mmol/L (ref 0–100)
pCO2 arterial: 48.7 mmHg — ABNORMAL HIGH (ref 35.0–45.0)
pH, Arterial: 7.18 — CL (ref 7.350–7.450)

## 2014-12-12 LAB — CBC WITH DIFFERENTIAL/PLATELET
Basophils Absolute: 0 10*3/uL (ref 0.0–0.1)
Basophils Relative: 0 % (ref 0–1)
EOS ABS: 0.1 10*3/uL (ref 0.0–0.7)
Eosinophils Relative: 1 % (ref 0–5)
HEMATOCRIT: 41.3 % (ref 36.0–46.0)
Hemoglobin: 13.7 g/dL (ref 12.0–15.0)
LYMPHS ABS: 3.6 10*3/uL (ref 0.7–4.0)
Lymphocytes Relative: 27 % (ref 12–46)
MCH: 31.4 pg (ref 26.0–34.0)
MCHC: 33.2 g/dL (ref 30.0–36.0)
MCV: 94.5 fL (ref 78.0–100.0)
MONO ABS: 0.4 10*3/uL (ref 0.1–1.0)
MONOS PCT: 3 % (ref 3–12)
NEUTROS ABS: 9.4 10*3/uL — AB (ref 1.7–7.7)
NEUTROS PCT: 69 % (ref 43–77)
Platelets: 235 10*3/uL (ref 150–400)
RBC: 4.37 MIL/uL (ref 3.87–5.11)
RDW: 14.1 % (ref 11.5–15.5)
WBC: 13.4 10*3/uL — ABNORMAL HIGH (ref 4.0–10.5)

## 2014-12-12 LAB — POCT I-STAT 3, ART BLOOD GAS (G3+)
ACID-BASE DEFICIT: 4 mmol/L — AB (ref 0.0–2.0)
BICARBONATE: 20 meq/L (ref 20.0–24.0)
O2 SAT: 99 %
PO2 ART: 128 mmHg — AB (ref 80.0–100.0)
TCO2: 21 mmol/L (ref 0–100)
pCO2 arterial: 31.2 mmHg — ABNORMAL LOW (ref 35.0–45.0)
pH, Arterial: 7.416 (ref 7.350–7.450)

## 2014-12-12 LAB — I-STAT CG4 LACTIC ACID, ED
Lactic Acid, Venous: 0.86 mmol/L (ref 0.5–2.0)
Lactic Acid, Venous: 4.46 mmol/L (ref 0.5–2.0)

## 2014-12-12 LAB — LACTIC ACID, PLASMA: LACTIC ACID, VENOUS: 1.3 mmol/L (ref 0.5–2.0)

## 2014-12-12 LAB — RAPID URINE DRUG SCREEN, HOSP PERFORMED
Amphetamines: NOT DETECTED
Barbiturates: NOT DETECTED
Benzodiazepines: POSITIVE — AB
Cocaine: NOT DETECTED
OPIATES: NOT DETECTED
Tetrahydrocannabinol: NOT DETECTED

## 2014-12-12 LAB — TROPONIN I
TROPONIN I: 0.16 ng/mL — AB (ref <0.031)
Troponin I: <0.03 ng/mL (ref <0.031)
Troponin I: 0.08 ng/mL — ABNORMAL HIGH (ref <0.031)

## 2014-12-12 LAB — CBG MONITORING, ED: Glucose-Capillary: 343 mg/dL — ABNORMAL HIGH (ref 65–99)

## 2014-12-12 LAB — I-STAT TROPONIN, ED: Troponin i, poc: 0.01 ng/mL (ref 0.00–0.08)

## 2014-12-12 LAB — URINE MICROSCOPIC-ADD ON

## 2014-12-12 LAB — SALICYLATE LEVEL

## 2014-12-12 LAB — BASIC METABOLIC PANEL
Anion gap: 8 (ref 5–15)
BUN: 8 mg/dL (ref 6–20)
CALCIUM: 8.2 mg/dL — AB (ref 8.9–10.3)
CO2: 21 mmol/L — ABNORMAL LOW (ref 22–32)
CREATININE: 0.92 mg/dL (ref 0.44–1.00)
Chloride: 108 mmol/L (ref 101–111)
GFR calc Af Amer: >60 mL/min (ref >60)
GFR calc non Af Amer: >60 mL/min (ref >60)
GLUCOSE: 96 mg/dL (ref 65–99)
Potassium: 4.3 mmol/L (ref 3.5–5.1)
Sodium: 137 mmol/L (ref 135–145)

## 2014-12-12 LAB — MRSA PCR SCREENING: MRSA BY PCR: NEGATIVE

## 2014-12-12 LAB — TSH: TSH: 1.399 u[IU]/mL (ref 0.350–4.500)

## 2014-12-12 LAB — ETHANOL

## 2014-12-12 LAB — TRIGLYCERIDES: Triglycerides: 178 mg/dL — ABNORMAL HIGH (ref <150)

## 2014-12-12 LAB — ACETAMINOPHEN LEVEL: Acetaminophen (Tylenol), Serum: <10 ug/mL — ABNORMAL LOW (ref 10–30)

## 2014-12-12 MED ORDER — LAMOTRIGINE 200 MG PO TABS
200.0000 mg | ORAL_TABLET | Freq: Two times a day (BID) | ORAL | Status: DC
  Filled 2014-12-12: qty 1

## 2014-12-12 MED ORDER — LAMOTRIGINE ER 200 MG PO TB24: 400.0000 mg | ORAL_TABLET | Freq: Every day | ORAL | Status: DC

## 2014-12-12 MED ORDER — DOPAMINE-DEXTROSE 3.2-5 MG/ML-% IV SOLN
INTRAVENOUS | Status: DC | PRN
  Administered 2014-12-12: 5 ug/kg/min via INTRAVENOUS

## 2014-12-12 MED ORDER — PROPOFOL 1000 MG/100ML IV EMUL
INTRAVENOUS | Status: AC
  Filled 2014-12-12: qty 100

## 2014-12-12 MED ORDER — ALBUTEROL SULFATE (2.5 MG/3ML) 0.083% IN NEBU
2.5000 mg | INHALATION_SOLUTION | RESPIRATORY_TRACT | Status: DC | PRN
  Administered 2014-12-13: 2.5 mg via RESPIRATORY_TRACT
  Filled 2014-12-12: qty 3

## 2014-12-12 MED ORDER — LIDOCAINE HCL (CARDIAC) 20 MG/ML IV SOLN
INTRAVENOUS | Status: AC
  Filled 2014-12-12: qty 5

## 2014-12-12 MED ORDER — HYDRALAZINE HCL 20 MG/ML IJ SOLN
10.0000 mg | INTRAMUSCULAR | Status: DC | PRN
  Administered 2014-12-12 – 2014-12-14 (×5): 20 mg via INTRAVENOUS
  Filled 2014-12-12 (×5): qty 1

## 2014-12-12 MED ORDER — CHLORHEXIDINE GLUCONATE 0.12 % MT SOLN
15.0000 mL | Freq: Two times a day (BID) | OROMUCOSAL | Status: DC
  Administered 2014-12-12 – 2014-12-13 (×3): 15 mL via OROMUCOSAL
  Filled 2014-12-12 (×3): qty 15

## 2014-12-12 MED ORDER — NITROGLYCERIN IN D5W 200-5 MCG/ML-% IV SOLN
0.0000 ug/min | Freq: Once | INTRAVENOUS | Status: AC
  Administered 2014-12-12: 25 ug/min via INTRAVENOUS

## 2014-12-12 MED ORDER — NITROGLYCERIN IN D5W 200-5 MCG/ML-% IV SOLN
0.0000 ug/min | INTRAVENOUS | Status: DC
  Administered 2014-12-12: 65 ug/min via INTRAVENOUS
  Filled 2014-12-12: qty 250

## 2014-12-12 MED ORDER — SODIUM CHLORIDE 0.9 % IJ SOLN
3.0000 mL | Freq: Two times a day (BID) | INTRAMUSCULAR | Status: DC
  Administered 2014-12-12 – 2014-12-16 (×9): 3 mL via INTRAVENOUS

## 2014-12-12 MED ORDER — SODIUM CHLORIDE 0.9 % IV SOLN
INTRAVENOUS | Status: DC
  Administered 2014-12-12: 50 mL/h via INTRAVENOUS

## 2014-12-12 MED ORDER — DEXTROSE 50 % IV SOLN
INTRAVENOUS | Status: DC | PRN
  Administered 2014-12-12: 1 via INTRAVENOUS

## 2014-12-12 MED ORDER — PROPOFOL 1000 MG/100ML IV EMUL
0.0000 ug/kg/min | INTRAVENOUS | Status: DC
  Administered 2014-12-12: 50 ug/kg/min via INTRAVENOUS
  Administered 2014-12-12: 45 ug/kg/min via INTRAVENOUS
  Administered 2014-12-12 – 2014-12-13 (×4): 50 ug/kg/min via INTRAVENOUS
  Filled 2014-12-12 (×7): qty 100

## 2014-12-12 MED ORDER — LAMOTRIGINE 200 MG PO TABS
200.0000 mg | ORAL_TABLET | Freq: Two times a day (BID) | ORAL | Status: DC
  Administered 2014-12-12 – 2014-12-13 (×4): 200 mg via NASOGASTRIC
  Filled 2014-12-12 (×6): qty 1

## 2014-12-12 MED ORDER — PROPOFOL 10 MG/ML IV BOLUS
20.0000 mg | Freq: Once | INTRAVENOUS | Status: AC
  Administered 2014-12-12: 20 mg via INTRAVENOUS

## 2014-12-12 MED ORDER — MIDAZOLAM HCL 2 MG/2ML IJ SOLN
INTRAMUSCULAR | Status: AC
  Filled 2014-12-12: qty 2

## 2014-12-12 MED ORDER — ETOMIDATE 2 MG/ML IV SOLN
INTRAVENOUS | Status: AC
  Filled 2014-12-12: qty 20

## 2014-12-12 MED ORDER — FAMOTIDINE IN NACL 20-0.9 MG/50ML-% IV SOLN
20.0000 mg | INTRAVENOUS | Status: DC
  Administered 2014-12-12: 20 mg via INTRAVENOUS
  Filled 2014-12-12 (×2): qty 50

## 2014-12-12 MED ORDER — MAGNESIUM SULFATE 50 % IJ SOLN
INTRAMUSCULAR | Status: DC | PRN
  Administered 2014-12-12: 2 g via INTRAVENOUS

## 2014-12-12 MED ORDER — FENTANYL CITRATE (PF) 100 MCG/2ML IJ SOLN
100.0000 ug | INTRAMUSCULAR | Status: DC | PRN
  Administered 2014-12-12 – 2014-12-13 (×4): 100 ug via INTRAVENOUS
  Filled 2014-12-12 (×3): qty 2

## 2014-12-12 MED ORDER — OXCARBAZEPINE 300 MG PO TABS
600.0000 mg | ORAL_TABLET | Freq: Two times a day (BID) | ORAL | Status: DC
  Administered 2014-12-12 – 2014-12-17 (×11): 600 mg via NASOGASTRIC
  Filled 2014-12-12 (×12): qty 2

## 2014-12-12 MED ORDER — HEPARIN SODIUM (PORCINE) 5000 UNIT/ML IJ SOLN
5000.0000 [IU] | Freq: Three times a day (TID) | INTRAMUSCULAR | Status: DC
  Administered 2014-12-12 – 2014-12-13 (×3): 5000 [IU] via SUBCUTANEOUS
  Filled 2014-12-12 (×5): qty 1

## 2014-12-12 MED ORDER — SUCCINYLCHOLINE CHLORIDE 20 MG/ML IJ SOLN
INTRAMUSCULAR | Status: AC
  Filled 2014-12-12: qty 1

## 2014-12-12 MED ORDER — OXCARBAZEPINE 300 MG PO TABS
600.0000 mg | ORAL_TABLET | Freq: Two times a day (BID) | ORAL | Status: DC
  Filled 2014-12-12 (×2): qty 2

## 2014-12-12 MED ORDER — NOREPINEPHRINE BITARTRATE 1 MG/ML IV SOLN
0.0000 ug/min | Freq: Once | INTRAVENOUS | Status: AC
  Administered 2014-12-12: 5 ug/min via INTRAVENOUS

## 2014-12-12 MED ORDER — POTASSIUM CHLORIDE 10 MEQ/100ML IV SOLN
INTRAVENOUS | Status: AC
  Filled 2014-12-12: qty 100

## 2014-12-12 MED ORDER — ETOMIDATE 2 MG/ML IV SOLN
INTRAVENOUS | Status: DC | PRN
  Administered 2014-12-12: 20 mg via INTRAVENOUS

## 2014-12-12 MED ORDER — CETYLPYRIDINIUM CHLORIDE 0.05 % MT LIQD
7.0000 mL | Freq: Four times a day (QID) | OROMUCOSAL | Status: DC
  Administered 2014-12-13 – 2014-12-14 (×5): 7 mL via OROMUCOSAL

## 2014-12-12 MED ORDER — LAMOTRIGINE 200 MG PO TABS: 200.0000 mg | ORAL_TABLET | Freq: Two times a day (BID) | ORAL | Status: DC

## 2014-12-12 MED ORDER — PROPOFOL 1000 MG/100ML IV EMUL
5.0000 ug/kg/min | Freq: Once | INTRAVENOUS | Status: DC
  Administered 2014-12-12: 20 ug/kg/min via INTRAVENOUS

## 2014-12-12 MED ORDER — MIDAZOLAM HCL 5 MG/5ML IJ SOLN
INTRAMUSCULAR | Status: DC | PRN
  Administered 2014-12-12: 2 mg via INTRAVENOUS

## 2014-12-12 MED ORDER — ATROPINE SULFATE 1 MG/ML IJ SOLN
INTRAMUSCULAR | Status: DC | PRN
  Administered 2014-12-12: 1 mg via INTRAVENOUS

## 2014-12-12 MED ORDER — ROCURONIUM BROMIDE 50 MG/5ML IV SOLN
INTRAVENOUS | Status: DC | PRN
  Administered 2014-12-12: 100 mg via INTRAVENOUS

## 2014-12-12 MED ORDER — FENTANYL CITRATE (PF) 100 MCG/2ML IJ SOLN
100.0000 ug | INTRAMUSCULAR | Status: DC | PRN
  Administered 2014-12-12: 100 ug via INTRAVENOUS
  Filled 2014-12-12 (×2): qty 2

## 2014-12-12 MED ORDER — SUCCINYLCHOLINE CHLORIDE 20 MG/ML IJ SOLN
INTRAMUSCULAR | Status: DC | PRN
  Administered 2014-12-12: 100 mg via INTRAVENOUS

## 2014-12-12 MED ORDER — ROCURONIUM BROMIDE 50 MG/5ML IV SOLN
INTRAVENOUS | Status: AC
  Filled 2014-12-12: qty 2

## 2014-12-12 NOTE — ED Notes (Signed)
Family at bedside. 

## 2014-12-12 NOTE — ED Notes (Signed)
Critical Care at bedside. MD made aware of BP.

## 2014-12-12 NOTE — Consult Note (Signed)
CARDIOLOGY CONSULT NOTE  Patient ID: RICARDO KAYES MRN: [id number] DOB/AGE: April 02, 1961 54 y.o.  Admit date: 12/12/2014 Primary Cardiologist: Hilty Reason for Consultation: Bradycardia, CHB  HPI:  54 yo with history of epilepsy, LBBB, and OSA was sent to the ER tonight with what appeared to be another seizure but was found to be bradycardic with HR in upper 20s-lower 30s and altered mental status.  She has history of seizure disorder, several seizures a month.  In the past, she has had grand mal seizures.  Now, her sister describes the seizures as a period of rigidity for a few minutes followed by some post-ictal confusion.  Tonight, she had another episode of rigidity then a period of confusion.  Sister sent her to ER via ambulance.  In the ER, she was noted to be lethargic and HR fell down to the upper 20s/lower 30s with complete heart block and a wide complex IVCD-type escape rhythm.    When I arrived, she was in the process of being intubated.  SBP was in the 70s-80s with HR in the upper 20s.  She received epinephrine with increase in HR to the 30s and was started on dopamine and then norepinephrine.  I gained femoral venous access under sterile conditions and placed a temporary transvenous pacemaker using fluoroscopy guidance.  I left HR at 80 bpm with output 10 mA.  Good capture with threshold 0.6 mA.  SBP rose to 190s and norepinephrine was stopped.  Dopamine is being titrated down.   Review of systems complete and found to be negative unless listed above in HPI  Past Medical History: 1. OSA on CPAP 2. Epilepsy: Described as intractable by Dr Jannifer Franklin.  2-3 seizures/month.  She is on oxacarbazepine and lamotrigine. 3. Active smoker 4. H/o diverticulitis 5. LBBB: Had workup with Lexiscan Cardiolite in 5/16 showing fixed anteroseptal defect consistent with LBBB artifact, no ischemia.  Current Facility-Administered Medications  Medication Dose Route Frequency Provider Last Rate Last  Dose  . atropine injection   Intravenous PRN Linton Flemings, MD   1 mg at 12/12/14 0341  . dextrose 50 % solution   Intravenous PRN Linton Flemings, MD   1 ampule at 12/12/14 0357  . DOPamine (INTROPIN) 800 mg in dextrose 5 % 250 mL (3.2 mg/mL) infusion   Intravenous Continuous PRN Linton Flemings, MD   Stopped at 12/12/14 0536  . etomidate (AMIDATE) injection   Intravenous PRN Linton Flemings, MD   20 mg at 12/12/14 0403  . lidocaine (cardiac) 100 mg/33ml (XYLOCAINE) 20 MG/ML injection 2%           . magnesium sulfate (IV Push/IM) injection   Intravenous PRN Linton Flemings, MD   2 g at 12/12/14 0352  . midazolam (VERSED) 5 MG/5ML injection   Intravenous PRN Linton Flemings, MD   2 mg at 12/12/14 0351  . potassium chloride 10 MEQ/100ML IVPB           . propofol (DIPRIVAN) 1000 MG/100ML infusion           . rocuronium (ZEMURON) injection   Intravenous PRN Linton Flemings, MD   100 mg at 12/12/14 0403  . succinylcholine (ANECTINE) injection   Intravenous PRN Linton Flemings, MD   100 mg at 12/12/14 0400   Current Outpatient Prescriptions  Medication Sig Dispense Refill  . ibuprofen (ADVIL,MOTRIN) 200 MG tablet Take 200 mg by mouth every 6 (six) hours as needed for mild pain or moderate pain. Liquid-Gels    . LamoTRIgine  XR (LAMICTAL XR) 200 MG TB24 Take 2 tablets (400 mg total) by mouth at bedtime. 60 tablet 3  . omeprazole (PRILOSEC) 20 MG capsule Take 20 mg by mouth daily.    Marland Kitchen oxcarbazepine (TRILEPTAL) 600 MG tablet Take 1 tablet (600 mg total) by mouth 2 (two) times daily. 180 tablet 1      Family History  Problem Relation Age of Onset  . Kidney failure Sister   . Diabetes Sister   . Alzheimer's disease Mother   . Hypertension Mother   . Hypertension Father   . Stroke Father   . Coronary artery disease Maternal Grandfather   . Parkinson's disease Maternal Grandmother   . Stroke Brother   . Hypertension Brother   . Sleep apnea Brother     History   Social History  . Marital Status: Single    Spouse Name: N/A  .  Number of Children: 0  . Years of Education: 14   Occupational History  . Not on file.   Social History Main Topics  . Smoking status: Current Every Day Smoker -- 1.00 packs/day for 35 years    Types: Cigarettes  . Smokeless tobacco: Never Used  . Alcohol Use: No     Comment: Quit 12/06/2009  . Drug Use: No  . Sexual Activity: Not on file   Other Topics Concern  . Not on file   Social History Narrative   Patient drinks 48 oz of soda daily.   Lives with sister   Some college   Left handed       Physical exam Blood pressure 143/59, pulse 80, temperature 98.1 F (36.7 C), temperature source Oral, resp. rate 20, SpO2 97 %. General: Intubated/sedated Neck: Thick, no JVD, no thyromegaly or thyroid nodule.  Lungs: Clear to auscultation bilaterally with normal respiratory effort. CV: Nondisplaced PMI.  Heart bradycardic, regular S1/S2, no S3/S4, no murmur.  No peripheral edema.  No carotid bruit.  Normal pedal pulses.  Abdomen: Soft, nontender, no hepatosplenomegaly, no distention.  Skin: Intact without lesions or rashes.  Neurologic: Sedated.  Extremities: No clubbing or cyanosis.  HEENT: Normal.   Labs:   Lab Results  Component Value Date   WBC 13.4* 12/12/2014   HGB 13.7 12/12/2014   HCT 41.3 12/12/2014   MCV 94.5 12/12/2014   PLT 235 12/12/2014    Recent Labs Lab 12/12/14 0410  NA 135  K 4.4  CL 103  BUN 11  CREATININE 0.90  GLUCOSE 310*    EKG: Complete heart block with wide complex (136 msec) escape rate 34 bpm   ASSESSMENT AND PLAN: 54 yo with history of epilepsy, LBBB, and OSA was sent to the ER tonight with what appeared to be another seizure but was found to be bradycardic with HR in upper 20s-lower 30s and altered mental status. 1. Bradycardia: Complete heart block with slow escape rhythm.  This was associated with hypotension and altered mental status, patient now intubated.  Temporary transvenous pacemaker in place.  She had a recent Cardiolite with  normal EF and no evidence for ischemia.  Doubt ACS.  No chest pain today, she just had what was thought to be her typical seizure activity at home.  She apparently has a true seizure disorder, but I wonder if some of her recent episodes may not have been due to decreased level of consciousness in the setting of hypotension/profound bradycardia.  Oxacarbazepine (which she is on for seizures) can rarely cause bradycardia.  - Titrate off dopamine  as able.  - Would cycle troponin to rule out ACS (doubt).  - Echo  - Will need permanent pacemaker, will have EP evaluate.  2. Seizure disorder: Suspect event tonight was not a seizure but was rather decreased level of consciousness with bradycardia/hypotension.  I wonder if some of her prior "seizures" were the same thing.  However, it does appear that she has a true seizure disorder.  3. Smoking: Needs to quit.   Signed: Loralie Champagne 12/12/2014 5:52 AM

## 2014-12-12 NOTE — H&P (Signed)
PULMONARY / CRITICAL CARE MEDICINE   Name: [name] [name] MRN: [id number] DOB: 11/20/1960    ADMISSION DATE:  12/12/2014  REFERRING MD :  ED MD  CHIEF COMPLAINT:  Witnessed seizure  INITIAL PRESENTATION: Patient w/ PMH significant for seizures, LBBB, and OSA. Presented to ED after witnessed seizure involving several minutes of rigidity and post-ictal confusion. Was found to be bradycardic to the upper 20s in complete heart block. Atropine provided. Patient intubated. Cardiology was called and transvenous pacer was inserted via femoral access.   STUDIES:  Lexascan 10/23/14: EF 55-65%  SIGNIFICANT EVENTS: Intubated 7/6 >> Transvenous Pacer 7/6 >>   HISTORY OF PRESENT ILLNESS:   Patient is a 54yo female with a h/o seizures, LBBB, and OSA. She was brought to ED via EMS after her sister witnessed her seizing. Per family seizures have been increasing in frequency. Episode lasted several minutes w/ generalized rigidity. Patient was confused post-ictally but was complaining of abdominal pain and ear pain. In the ED HR was noted to have complete heart block with a HR in the upper 20s. Cardiology was called, and she was given atropine. Patient was being intubated when Cardiology arrived; they placed a transvenous pacemaker via femoral access. Patient was admitted to the ICU.  PAST MEDICAL HISTORY :   has a past medical history of Seizures; Diverticulitis; GERD (gastroesophageal reflux disease); Hiatal hernia; Wrist tendonitis; Panic disorder (09/07/2014); OSA on CPAP; and LBBB (left bundle branch block).  has past surgical history that includes Tubal ligation and Hand surgery. Prior to Admission medications   Medication Sig Start Date End Date Taking? Authorizing Provider  ibuprofen (ADVIL,MOTRIN) 200 MG tablet Take 200 mg by mouth every 6 (six) hours as needed for mild pain or moderate pain. Liquid-Gels   Yes Historical Provider, MD  LamoTRIgine XR (LAMICTAL XR) 200 MG TB24 Take 2 tablets (400 mg  total) by mouth at bedtime. 11/05/14  Yes Kathrynn Ducking, MD  omeprazole (PRILOSEC) 20 MG capsule Take 20 mg by mouth daily.   Yes Historical Provider, MD  oxcarbazepine (TRILEPTAL) 600 MG tablet Take 1 tablet (600 mg total) by mouth 2 (two) times daily. 09/10/14  Yes Kathrynn Ducking, MD   Allergies  Allergen Reactions  . Zonisamide Anxiety    Panic Attacks  . Aspirin     sensitivity  . Ciprofloxacin     Seizures & it keeps her awake  . Codeine Hives  . Hydrocodone Hives  . Oxycodone Hives  . Penicillins Hives    FAMILY HISTORY:  indicated that her mother is deceased. She indicated that her father is alive. She indicated that both of her sisters are alive. She indicated that her brother is alive.  SOCIAL HISTORY:  reports that she has been smoking Cigarettes.  She has a 35 pack-year smoking history. She has never used smokeless tobacco. She reports that she does not drink alcohol or use illicit drugs.  REVIEW OF SYSTEMS:  No reports of fever, chills, trauma, falls, HA, vision change, CP, SOB, n/v/d  SUBJECTIVE:   VITAL SIGNS: Temp:  [95.7 F (35.4 C)-98.1 F (36.7 C)] 95.9 F (35.5 C) (07/06 0744) Pulse Rate:  [32-111] 95 (07/06 0744) Resp:  [12-29] 20 (07/06 0744) BP: (60-165)/(31-103) 143/59 mmHg (07/06 0515) SpO2:  [79 %-100 %] 100 % (07/06 0744) Arterial Line BP: (161-241)/(76-107) 169/78 mmHg (07/06 0744) FiO2 (%):  [100 %] 100 % (07/06 0500) Weight:  [175 lb (79.379 kg)] 175 lb (79.379 kg) (07/06 0622) HEMODYNAMICS:   VENTILATOR  SETTINGS: Vent Mode:  [-] PRVC FiO2 (%):  [100 %] 100 % Set Rate:  [14 bmp] 14 bmp Vt Set:  [500 mL] 500 mL PEEP:  [5 cmH20] 5 cmH20 Plateau Pressure:  [23 cmH20] 23 cmH20 INTAKE / OUTPUT:  Intake/Output Summary (Last 24 hours) at 12/12/14 0754 Last data filed at 12/12/14 2130  Gross per 24 hour  Intake      0 ml  Output    300 ml  Net   -300 ml    PHYSICAL EXAMINATION: General -- under sedation, intubated HEENT -- Head is  normocephalic. PERRL. Ears/nose benign. MMM Neck -- supple; no bruits. Integument -- intact. No rash, erythema, or ecchymoses.  Chest -- Lungs clear to auscultation. No crackle/wheezes noted Cardiac -- RRR. No murmurs noted.  Abdomen -- soft, nontender. No masses palpable. Bowel sounds present. CNS -- sedated Extremeties - passive ROM preserved throughout, no gross deformities. Dorsalis pedis pulses present and symmetrical.    LABS:  CBC  Recent Labs Lab 12/12/14 0410 12/12/14 0428  WBC  --  13.4*  HGB 13.9 13.7  HCT 41.0 41.3  PLT  --  235   Coag's No results for input(s): APTT, INR in the last 168 hours. BMET  Recent Labs Lab 12/12/14 0410 12/12/14 0428  NA 135 135  K 4.4 3.5  CL 103 103  CO2  --  18*  BUN 11 8  CREATININE 0.90 1.26*  GLUCOSE 310* 334*   Electrolytes  Recent Labs Lab 12/12/14 0428  CALCIUM 7.6*   Sepsis Markers  Recent Labs Lab 12/12/14 0355 12/12/14 0430  LATICACIDVEN 0.86 4.46*   ABG  Recent Labs Lab 12/12/14 0445  PHART 7.180*  PCO2ART 48.7*  PO2ART 65.0*   Liver Enzymes  Recent Labs Lab 12/12/14 0428  AST 66*  ALT 39  ALKPHOS 146*  BILITOT 0.7  ALBUMIN 3.0*   Cardiac Enzymes  Recent Labs Lab 12/12/14 0428  TROPONINI <0.03   Glucose  Recent Labs Lab 12/12/14 0407  GLUCAP 343*    Imaging Dg Chest Portable 1 View  12/12/2014   CLINICAL DATA:  Endotracheal tube placement.  EXAM: PORTABLE CHEST - 1 VIEW  COMPARISON:  None.  FINDINGS: Endotracheal tube with tip between the clavicular heads and carina.  There is a temporary pacer lead from below which is likely at the right ventricular outflow tract. Heart size is normal. Streaky opacities in the lower lungs compatible with atelectasis. No pulmonary edema, effusion, or pneumothorax.  IMPRESSION: 1. Unremarkable positioning of endotracheal tube and temporary pacer. 2. Low lung volumes with atelectasis. 3. No cardiomegaly or pulmonary edema.   Electronically  Signed   By: Monte Fantasia M.D.   On: 12/12/2014 06:15   Dg C-arm 1-60 Min-no Report  12/12/2014   CLINICAL DATA: pacemaker placement   C-ARM 1-60 MINUTES  Fluoroscopy was utilized by the requesting physician.  No radiographic  interpretation.      ASSESSMENT / PLAN:  PULMONARY OETT 7/6 >> A:  ARF - unknown etiology at this time; ddx includes ARDS, aspiration, PE OSA, Hx - likely chronic retainer Active smoker P:   Intubated 7/6 >> Daily ABG Daily CXR Full vent support Vent bundle   CARDIOVASCULAR CVL: femoral A:  Complete heart block; requiring pacer Cardiogenic vs obstructive shock - lactic acidosis 4.46 LBBB, Hx; no recent chest pain Recent Cardiolite: EF 55-65% P:  Cards consulted: appreciate the input Cycle troponins Echo Will need permanent pacemaker (per cards); EP to eval Monitor BP  RENAL A:  Metabolic acidosis, 2/2 lactic acidosis (likely some baseline acidosis w/ OSA) P:   Serial BMPs (watch for ATN from period of low CO) IVF as tol  GASTROINTESTINAL A:  Abdominal pain; post-ictal complaint prior to ED presentation Diverticulitis, Hx P:   Monitor BMs Watch for signs of acute abdomen  HEMATOLOGIC A:  Leukocytosis, 13.4 P:  Assess for source vs stress response. Monitor for fevers  INFECTIOUS A:  Possible aspiration 2/2 seizure activity P:   Abx: Considered Levaquin but Cipro is listed as allergy causing "seizures". Will allow day team to determine if abx is necessary at this time.  ENDOCRINE A:  No Hx of DM; CBG 343 in ED   P:   Monitor CBGs; low threshold for SSI  NEUROLOGIC A:   Epilepsy, Hx: followed by [name] as outpatient Possible head trauma; can not r/o at this time P:   CT head ordered to r/o bleed Neurology consulted: appreciate the input Continue home Lamictal and Oxcarbazepine for now Consider EEG RASS goal: -1   FAMILY  - Updates: Brother and Sister updated at bedside by Dr. Elsworth Soho  - Inter-disciplinary family  meet or Palliative Care meeting due by:  7/14    Elberta Leatherwood, MD,MS,  PGY2 12/12/2014 7:54 AM

## 2014-12-12 NOTE — ED Notes (Addendum)
Per EMS - pt sister called EMS after witnessing pt having seizure, sister reports seizures have been happening more and more lately.  En route, EMS gave 0.5mg  Atropine when pt HR dropped below 30. Upon arrival to ED, pt bradycardic, disoriented, and diaphoretic. HR 40.

## 2014-12-12 NOTE — ED Provider Notes (Signed)
CSN: [id number]     Arrival date & time 12/12/14  5284 History   This chart was scribed for  Linton Flemings, MD by Altamease Oiler, ED Scribe. This patient was seen in room [facility] and the patient's care was started at 3:34 AM.    Chief Complaint  Patient presents with  . Bradycardia  . Seizures    HPI [name] [name] is a 54 y.o. female with PMHx of seizures who presents to the Emergency Department complaining of seizure witnessed by a sister this just PTA. Pt was mentating on EMS arrival but declined en route. EMS reports that the pt has been bradycardic in the 10s-40s with no improvement after 0.5 atropine. Pt complains of abdominal pain and states that she has cardiac history of  LBBB.   Past Medical History  Diagnosis Date  . Seizures   . Diverticulitis   . GERD (gastroesophageal reflux disease)   . Hiatal hernia   . Wrist tendonitis     R   . Panic disorder 09/07/2014  . OSA on CPAP   . LBBB (left bundle branch block)    Past Surgical History  Procedure Laterality Date  . Tubal ligation    . Hand surgery     Family History  Problem Relation Age of Onset  . Kidney failure Sister   . Diabetes Sister   . Alzheimer's disease Mother   . Hypertension Mother   . Hypertension Father   . Stroke Father   . Coronary artery disease Maternal Grandfather   . Parkinson's disease Maternal Grandmother   . Stroke Brother   . Hypertension Brother   . Sleep apnea Brother    History  Substance Use Topics  . Smoking status: Current Every Day Smoker -- 1.00 packs/day for 35 years    Types: Cigarettes  . Smokeless tobacco: Never Used  . Alcohol Use: No     Comment: Quit 12/06/2009   OB History    No data available     Review of Systems  Unable to perform ROS: Mental status change    Allergies  Zonisamide; Aspirin; Ciprofloxacin; Codeine; and Penicillins  Home Medications   Prior to Admission medications   Medication Sig Start Date End Date Taking? Authorizing Provider   LamoTRIgine XR (LAMICTAL XR) 200 MG TB24 Take 2 tablets (400 mg total) by mouth at bedtime. 11/05/14   Kathrynn Ducking, MD  NON FORMULARY at bedtime. CPAP    Historical Provider, MD  omeprazole (PRILOSEC) 20 MG capsule Take 20 mg by mouth daily.    Historical Provider, MD  oxcarbazepine (TRILEPTAL) 600 MG tablet Take 1 tablet (600 mg total) by mouth 2 (two) times daily. 09/10/14   Kathrynn Ducking, MD   Triage Vitals: BP 123/57 mmHg  Pulse 38  Temp(Src) 98.1 F (36.7 C) (Oral)  Resp 20  SpO2 99% Physical Exam  Constitutional: She appears well-developed and well-nourished.  Patient had altered mental status, had two tonic-clonic episodes.  Patient did not return to baseline mental status.  In between episodes.  Patient was able to give only brief answers to questions.  She seemed confused when she was able to answer questions  HENT:  Head: Normocephalic and atraumatic.  Nose: Nose normal.  Dry mucous membranes  Eyes: Conjunctivae and EOM are normal. Pupils are equal, round, and reactive to light.  Neck: Normal range of motion. Neck supple. No JVD present. No tracheal deviation present. No thyromegaly present.  Cardiovascular: Normal heart sounds and intact distal  pulses.  Exam reveals no gallop and no friction rub.   No murmur heard. Bradycardia with irregular rhythm  Pulmonary/Chest: Effort normal and breath sounds normal. No stridor. No respiratory distress. She has no wheezes. She has no rales. She exhibits no tenderness.  Abdominal: Soft. Bowel sounds are normal. She exhibits distension. She exhibits no mass. There is tenderness (diffuse tenderness throughout abdomen). There is no rebound and no guarding.  Musculoskeletal: Normal range of motion. She exhibits edema. She exhibits no tenderness.  Lymphadenopathy:    She has no cervical adenopathy.  Neurological: She displays normal reflexes. She exhibits normal muscle tone. Coordination normal.  Skin: Skin is warm and dry. No rash  noted. No erythema. No pallor.  Nursing note and vitals reviewed.   ED Course  Procedures (including critical care time)  INTUBATION Performed by: Linton Flemings, MD  Required items: required blood products, implants, devices, and special equipment available Patient identity confirmed: provided demographic data and hospital-assigned identification number Time out: Immediately prior to procedure a "time out" was called to verify the correct patient, procedure, equipment, support staff and site/side marked as required.  Indications: Altered mental status, airway protection, coding   Intubation method:Glidescope Laryngoscopy   Preoxygenation:BVM  Sedatives: Ativan, etomidate  Paralytic: Succinylcholine, rocuronium   Tube Size: 7.0 cuffed  Patient was a difficult intubation due to body habitus, small mouth, large tongue.  Patient had desaturation in between 2 attempts but was bagged back to normal saturations prior to second attempt.    Post-procedure assessment: chest rise and ETCO2 monitor Breath sounds: equal and absent over the epigastrium Tube secured with: ETT holder Chest x-ray interpreted by radiologist and me.  Chest x-ray findings: endotracheal tube in appropriate position  Patient tolerated the procedure well with no immediate complications.     COORDINATION OF CARE: 3:31 AM Treatment plan includes lab work and EKG.  3:43 AM-Consult complete with Dr. Aundra Dubin (cardiology). Patient case explained and discussed. Call ended at 3:48 AM.  3:55 AM-Consult complete with  Dr Aundra Dubin. Patient case explained and discussed.Call ended at 3:56 AM.    Labs Review Labs Reviewed  CBC WITH DIFFERENTIAL/PLATELET - Abnormal; Notable for the following:    WBC 13.4 (*)    Neutro Abs 9.4 (*)    All other components within normal limits  COMPREHENSIVE METABOLIC PANEL - Abnormal; Notable for the following:    CO2 18 (*)    Glucose, Bld 334 (*)    Creatinine, Ser 1.26 (*)    Calcium  7.6 (*)    Total Protein 5.4 (*)    Albumin 3.0 (*)    AST 66 (*)    Alkaline Phosphatase 146 (*)    GFR calc non Af Amer 47 (*)    GFR calc Af Amer 55 (*)    All other components within normal limits  ACETAMINOPHEN LEVEL - Abnormal; Notable for the following:    Acetaminophen (Tylenol), Serum <10 (*)    All other components within normal limits  I-STAT CHEM 8, ED - Abnormal; Notable for the following:    Glucose, Bld 310 (*)    Calcium, Ion 1.03 (*)    All other components within normal limits  CBG MONITORING, ED - Abnormal; Notable for the following:    Glucose-Capillary 343 (*)    All other components within normal limits  I-STAT CG4 LACTIC ACID, ED - Abnormal; Notable for the following:    Lactic Acid, Venous 4.46 (*)    All other components within normal limits  I-STAT ARTERIAL BLOOD GAS, ED - Abnormal; Notable for the following:    pH, Arterial 7.180 (*)    pCO2 arterial 48.7 (*)    pO2, Arterial 65.0 (*)    Bicarbonate 18.2 (*)    Acid-base deficit 10.0 (*)    All other components within normal limits  ETHANOL  TROPONIN I  SALICYLATE LEVEL  CBC WITH DIFFERENTIAL/PLATELET  URINE RAPID DRUG SCREEN, HOSP PERFORMED  URINALYSIS, ROUTINE W REFLEX MICROSCOPIC (NOT AT [facility])  TROPONIN I  TROPONIN I  TROPONIN I  I-STAT CG4 LACTIC ACID, ED  Randolm Idol, ED    Imaging Review Dg Chest Portable 1 View  12/12/2014   CLINICAL DATA:  Endotracheal tube placement.  EXAM: PORTABLE CHEST - 1 VIEW  COMPARISON:  None.  FINDINGS: Endotracheal tube with tip between the clavicular heads and carina.  There is a temporary pacer lead from below which is likely at the right ventricular outflow tract. Heart size is normal. Streaky opacities in the lower lungs compatible with atelectasis. No pulmonary edema, effusion, or pneumothorax.  IMPRESSION: 1. Unremarkable positioning of endotracheal tube and temporary pacer. 2. Low lung volumes with atelectasis. 3. No cardiomegaly or pulmonary edema.    Electronically Signed   By: Monte Fantasia M.D.   On: 12/12/2014 06:15   Dg C-arm 1-60 Min-no Report  12/12/2014   CLINICAL DATA: pacemaker placement   C-ARM 1-60 MINUTES  Fluoroscopy was utilized by the requesting physician.  No radiographic  interpretation.      EKG Interpretation   Date/Time:  Wednesday December 12 2014 03:39:37 EDT Ventricular Rate:  34 PR Interval:    QRS Duration: 136 QT Interval:  616 QTC Calculation: 463 R Axis:   112 Text Interpretation:  Junctional rhythm Nonspecific intraventricular  conduction delay Anteroseptal infarct, age indeterminate Confirmed by  Tremaine Earwood  MD, Renell Coaxum ([id number]) on 12/12/2014 6:40:58 AM      EKG Interpretation  Date/Time:  Wednesday December 12 2014 03:49:25 EDT Ventricular Rate:  36 PR Interval:    QRS Duration: 133 QT Interval:  604 QTC Calculation: 467 R Axis:   83 Text Interpretation:  AV block, complete (third degree) Ventricular premature complex IVCD, consider atypical RBBB Confirmed by Gwyndolyn Guilford  MD, Revel Stellmach ([id number]) on 12/12/2014 6:41:46 AM        EKG Interpretation  Date/Time:  Wednesday December 12 2014 04:04:12 EDT Ventricular Rate:  52 PR Interval:    QRS Duration: 143 QT Interval:  520 QTC Calculation: 484 R Axis:   105 Text Interpretation:  Junctional rhythm Nonspecific intraventricular conduction delay Lateral infarct, age indeterminate Repol abnrm, severe global ischemia (LM/MVD) Confirmed by Merwin Breden  MD, Kelcie Currie ([id number]) on 12/12/2014 6:42:23 AM       EKG Interpretation  Date/Time:  Wednesday December 12 2014 06:30:58 EDT Ventricular Rate:  106 PR Interval:    QRS Duration: 131 QT Interval:  486 QTC Calculation: 645 R Axis:   65 Text Interpretation:  Junctional tachycardia Left bundle branch block no pacer spikes noted Confirmed by Jatavian Calica  MD, Azelea Seguin ([id number]) on 12/12/2014 6:43:17 AM      CRITICAL CARE Performed by: Kalman Drape Total critical care time: 90 min Critical care time was exclusive of separately billable procedures and  treating other patients. Critical care was necessary to treat or prevent imminent or life-threatening deterioration. Critical care was time spent personally by me on the following activities: development of treatment plan with patient and/or surrogate as well as nursing, discussions with consultants, evaluation of patient's response to treatment,  examination of patient, obtaining history from patient or surrogate, ordering and performing treatments and interventions, ordering and review of laboratory studies, ordering and review of radiographic studies, pulse oximetry and re-evaluation of patient's condition.   MDM   Final diagnoses:  Altered mental status, unspecified altered mental status type  Complete heart block  Symptomatic bradycardia  Convulsions, unspecified convulsion type   I personally performed the services described in this documentation, which was scribed in my presence. The recorded information has been reviewed and is accurate.  54 year old female with worsening seizures, presents bradycardic.  It is unclear if she has been having breakthrough seizures or if she is having altered consciousness and convulsions secondary to extreme bradycardia.  Patient decompensated soon after arrival, and was intubated for airway control.  Cardiology, Dr. Aundra Dubin was consulted.  Patient was having increasing bradycardia with long pauses with only P waves noted on the monitor.  Patient was a difficult intubation.  There were complications involving the materials required for transvenous pacing.  This was eventually accomplished by Dr Aundra Dubin, and patient stabilizing.  Critical care involved and will admit.  Family updated and Informed during patient's stay     Linton Flemings, MD 12/12/14 [phone]

## 2014-12-12 NOTE — Progress Notes (Signed)
Inpatient Diabetes Program Recommendations  AACE/ADA: New Consensus Statement on Inpatient Glycemic Control (2013)  Target Ranges:  Prepandial:   less than 140 mg/dL      Peak postprandial:   less than 180 mg/dL (1-2 hours)      Critically ill patients:  140 - 180 mg/dL   Reason for Assessment: Hyperglycemia  Diabetes history: None Outpatient Diabetes medications: N/A Current orders for Inpatient glycemic control: None Results for SAHVANNA, MCMANIGAL (MRN [id number]) as of 12/12/2014 12:28  Ref. Range 12/12/2014 04:10 12/12/2014 04:28  Glucose Latest Ref Range: 65-99 mg/dL 310 (H) 334 (H)   Results for EVAGELIA, KNACK (MRN [id number]) as of 12/12/2014 12:28  Ref. Range 12/12/2014 04:07 12/12/2014 08:36  Glucose-Capillary Latest Ref Range: 65-99 mg/dL 343 (H) 116 (H)    Inpatient Diabetes Program Recommendations Insulin - Basal: If FBS continues > 180 mg/dL, consider addition of basal insulin Correction (SSI): Add Novolog sensitive Q4H HgbA1C: Check HgbA1C to assess glycemic control prior to hospitalization  Note: Will follow. Thank you. Lorenda Peck, RD, LDN, CDE Inpatient Diabetes Coordinator [phone]

## 2014-12-12 NOTE — Progress Notes (Signed)
Echocardiogram 2D Echocardiogram has been performed.  Joelene Millin 12/12/2014, 10:02 AM

## 2014-12-12 NOTE — Procedures (Signed)
EEG report.  Brief clinical history:  54 YO female with history of epilepsy and no actually seizures captured on EEG in past. She has been on Lamictal and Trileptal with breakthrough seizures every 2 weeks. Patient was brought to hospital for similar seizure activity but now found to be in complete heart block with HR in 20's. Given recent event question if events are actually syncope related to cardiac etiology.   Technique: this is a 17 channel routine scalp EEG performed at the bedside in the ICU, with bipolar and monopolar montages arranged in accordance to the international 10/20 system of electrode placement. One channel was dedicated to EKG recording.  Patient is intubated on the vent and sedated. No activating procedures performed.  Description:  Evaluation of the best background is hampered by the fact that the patient is intubated on the vent and sedated. However, throughout the entire recording there is evidence of alpha frequencies, predominantly medium amplitude 13 Hz background that is diffusely distributed, well sustained, symmetric and reactive.  No epileptiform discharges or electrographic seizures noted.  No pathologic areas of slowing seen.  EKG showed sinus rhythm.  Impression: this is a normal EEG performed in the ICU environment. Please, be aware that a normal EEG does not exclude the possibility of epilepsy.  Clinical correlation is advised.   Dorian Pod, MD [facility]

## 2014-12-12 NOTE — Progress Notes (Signed)
Chaplain Note:   Family bedside.   No further support needed.   Chaplain signing off.   Delford Field, [place] 12/12/2014 6:28 AM

## 2014-12-12 NOTE — ED Notes (Signed)
Cardiology paged.  

## 2014-12-12 NOTE — Consult Note (Signed)
ELECTROPHYSIOLOGY CONSULT NOTE    Patient ID: TEMPEST FRANKLAND MRN: [id number], DOB/AGE: Jun 05, 1961 54 y.o.  Admit date: 12/12/2014 Date of Consult: 12/12/2014  Primary Physician: Milagros Evener, MD Primary Cardiologist: [facility]  Reason for Consultation: intermittent complete heart block  HPI:  DEKOTA KIRLIN is a 54 y.o. female with a past medical history significant for epilepsy, LBBB, and sleep apnea on CPAP.  She is currently intubated and sedated - history is obtained from chart.  She has a long standing history of grand mal seizures.  She has been having different symptoms in the last few months where she has spells of "going rigid". She was sent to the ER last night with another "seizure" but was found to be bradycardic with heart rates in the 20's and in complete heart block. She was intubated and underwent emergency temp pacemaker placement in the ER.  EP has been asked to evaluate for treatment options.  Myoview 10/2014 demonstrated EF 58% with fixed defect consistent with LBBB.    ROS is unable to be obtained 2/2 intubation and sedation  Past Medical History  Diagnosis Date  . Seizures   . Diverticulitis   . GERD (gastroesophageal reflux disease)   . Hiatal hernia   . Wrist tendonitis     R   . Panic disorder 09/07/2014  . OSA on CPAP   . LBBB (left bundle branch block)      Surgical History:  Past Surgical History  Procedure Laterality Date  . Tubal ligation    . Hand surgery       Prescriptions prior to admission  Medication Sig Dispense Refill Last Dose  . ibuprofen (ADVIL,MOTRIN) 200 MG tablet Take 200 mg by mouth every 6 (six) hours as needed for mild pain or moderate pain. Liquid-Gels   12/09/2014  . LamoTRIgine XR (LAMICTAL XR) 200 MG TB24 Take 2 tablets (400 mg total) by mouth at bedtime. 60 tablet 3 12/11/2014 at 2200  . omeprazole (PRILOSEC) 20 MG capsule Take 20 mg by mouth daily.   12/11/2014 at Unknown time  . oxcarbazepine (TRILEPTAL) 600 MG tablet Take  1 tablet (600 mg total) by mouth 2 (two) times daily. 180 tablet 1 12/11/2014 at 1900    Inpatient Medications:  . heparin  5,000 Units Subcutaneous 3 times per day  . LamoTRIgine XR  400 mg Oral QHS  . lidocaine (cardiac) 100 mg/44ml      . oxcarbazepine  600 mg Oral BID  . potassium chloride      . sodium chloride  3 mL Intravenous Q12H    Allergies:  Allergies  Allergen Reactions  . Zonisamide Anxiety    Panic Attacks  . Aspirin     sensitivity  . Ciprofloxacin     Seizures & it keeps her awake  . Codeine Hives  . Hydrocodone Hives  . Oxycodone Hives  . Penicillins Hives    History   Social History  . Marital Status: Single    Spouse Name: N/A  . Number of Children: 0  . Years of Education: 14   Occupational History  . Not on file.   Social History Main Topics  . Smoking status: Current Every Day Smoker -- 1.00 packs/day for 35 years    Types: Cigarettes  . Smokeless tobacco: Never Used  . Alcohol Use: No     Comment: Quit 12/06/2009  . Drug Use: No  . Sexual Activity: Not on file   Other Topics Concern  . Not on file  Social History Narrative   Patient drinks 48 oz of soda daily.   Lives with sister   Some college   Left handed        Family History  Problem Relation Age of Onset  . Kidney failure Sister   . Diabetes Sister   . Alzheimer's disease Mother   . Hypertension Mother   . Hypertension Father   . Stroke Father   . Coronary artery disease Maternal Grandfather   . Parkinson's disease Maternal Grandmother   . Stroke Brother   . Hypertension Brother   . Sleep apnea Brother      Physical Exam: Filed Vitals:   12/12/14 0742 12/12/14 0743 12/12/14 0744 12/12/14 0810  BP:    153/74  Pulse: 96 95 95 99  Temp: 95.7 F (35.4 C) 95.7 F (35.4 C) 95.9 F (35.5 C)   TempSrc:      Resp: 20 20 20 20   Height:      Weight:      SpO2: 100% 100% 100% 100%    GEN- The patient is intubated and sedated HEENT: normocephalic, atraumatic;  sclera clear, conjunctiva pink; hearing intact; oropharynx clear; neck supple, no JVP Lymph- no cervical lymphadenopathy Lungs- Clear to ausculation bilaterally, normal work of breathing.  No wheezes, rales, rhonchi Heart- Regular rate and rhythm, no murmurs, rubs or gallops  GI- soft, non-tender, non-distended, bowel sounds present  Extremities- no clubbing, cyanosis, or edema; DP/PT/radial pulses 2+ bilaterally MS- no significant deformity or atrophy Skin- warm and dry, no rash or lesion Psych- euthymic mood, full affect Neuro- strength and sensation are intact  Labs:   Lab Results  Component Value Date   WBC 13.4* 12/12/2014   HGB 13.7 12/12/2014   HCT 41.3 12/12/2014   MCV 94.5 12/12/2014   PLT 235 12/12/2014    Recent Labs Lab 12/12/14 0428  NA 135  K 3.5  CL 103  CO2 18*  BUN 8  CREATININE 1.26*  CALCIUM 7.6*  PROT 5.4*  BILITOT 0.7  ALKPHOS 146*  ALT 39  AST 66*  GLUCOSE 334*      Radiology/Studies: Dg Chest Portable 1 View 12/12/2014   CLINICAL DATA:  Endotracheal tube placement.  EXAM: PORTABLE CHEST - 1 VIEW  COMPARISON:  None.  FINDINGS: Endotracheal tube with tip between the clavicular heads and carina.  There is a temporary pacer lead from below which is likely at the right ventricular outflow tract. Heart size is normal. Streaky opacities in the lower lungs compatible with atelectasis. No pulmonary edema, effusion, or pneumothorax.  IMPRESSION: 1. Unremarkable positioning of endotracheal tube and temporary pacer. 2. Low lung volumes with atelectasis. 3. No cardiomegaly or pulmonary edema.   Electronically Signed   By: Monte Fantasia M.D.   On: 12/12/2014 06:15   NWG:NFAOZHYQ heart block, ventricular rate 34  TELEMETRY: sinus rhythm  Assessment/Plan: 1.  Intermittent complete heart block/Stokes-Adams syncope The patient has underlying conduction system disease with LBBB.  She presented to the ER with "seizures" and was found to be in complete heart block  with ventricular rates in the 20's.  She is on no AVN blocking agents and lab work is unremarkable. She has no reversible causes for her heart block and pacemaker implantation is recommended. At this time, she is intubated and sedated. Would prefer to extubate patient today if able and plan PPM later this afternoon. Will discuss with patient and her family once she is extubated hopefully later today.   Signed, Chanetta Marshall, NP  12/12/2014 8:23 AM  EP Attending  Patient seen and examined. Agree with above. Her conduction has returned this morning. She has Stokes-Adams syncope. Hopefully she can be extubated prior to insertion of her DDD PM. I have reprogrammed her temporary PM back to a lower rate of 40 (from 80) and increased his sensitivity from 2 to 1 and she is sensing appropriately.   Mikle Bosworth.D.

## 2014-12-12 NOTE — ED Notes (Signed)
Critical Care at bedside.  

## 2014-12-12 NOTE — Progress Notes (Signed)
Bedside EEG completed, results pending. 

## 2014-12-12 NOTE — Progress Notes (Signed)
Orthopedic Tech Progress Note Patient Details:  [name] [name] 1960-12-16 [id number]  Ortho Devices Type of Ortho Device: Knee Immobilizer Ortho Device/Splint Location: RLE Ortho Device/Splint Interventions: Ordered, Application   Braulio Bosch 12/12/2014, 9:23 PM

## 2014-12-12 NOTE — CV Procedure (Signed)
Procedure: Temporary transvenous pacemaker placement  Indication: Complete heart block with ventricular escape rate in 20s, hypotensive.  Altered mental status, patient being intubated at time of pacemaker placement.   Procedure note: The right groin was sterilely prepped and draped.  The right femoral vein was accessed using modified Seldinger technique and a venous sheath was placed.  The balloon-tipped temporary pacer wire was then floated to position in the right ventricle using portable fluoroscopy guidance.  Pacing threshold was 0.6 mA, for the time being output was left at 10 mA.  Pacing rate set to 80.  BP immediately rose to SBP 160s.    No complications.   Loralie Champagne 12/12/2014 5:29 AM

## 2014-12-12 NOTE — Consult Note (Signed)
NEURO HOSPITALIST CONSULT NOTE   Referring physician:Alva   Reason for Consult: possible seizure versus syncope in setting of heart block  HPI:                                                                                                                                          [name] [name] is an 54 y.o. female with long history of epilepsy. She has been seen by [name] in the past at that time "The patient was averaging 2 or 3 seizures a month. The patient had been on Trileptal, she has run good blood levels with 600 mg twice daily dosing. She had been placed on Lamictal after it was felt that she could not tolerate the Zonegran secondary to panic attacks. she is on 200 mg twice daily of Lamictal. Per brother who is inovolved in her care, she takes her medications on time and is religious about this.  She had a seizure last Sunday and prior to that two weeks ago.. She has a seizure about every two weeks. Seizure described as have a aura of dizziness prior to event and then will suddenly "briefly shake with tremor and then pass out for about 1-2 minutes. She will come around and be normal in a few minutes after bing groggy.  She has never had a seizure lasting longer than a few minutes and never confused more than a few minutes. " Per notes-- patient has had a video EEG monitoring study previously, but no seizures were noted during the monitoring period.  She was brought to hospital after having a episode of possible seizure and in the ED HR was noted to have complete heart block with a HR in the upper 20s. Currently she is being paced and plan is to place a Psychologist, forensic.   Past Medical History  Diagnosis Date  . Seizures   . Diverticulitis   . GERD (gastroesophageal reflux disease)   . Hiatal hernia   . Wrist tendonitis     R   . Panic disorder 09/07/2014  . OSA on CPAP   . LBBB (left bundle branch block)     Past Surgical History  Procedure Laterality Date  .  Tubal ligation    . Hand surgery      Family History  Problem Relation Age of Onset  . Kidney failure Sister   . Diabetes Sister   . Alzheimer's disease Mother   . Hypertension Mother   . Hypertension Father   . Stroke Father   . Coronary artery disease Maternal Grandfather   . Parkinson's disease Maternal Grandmother   . Stroke Brother   . Hypertension Brother   . Sleep apnea Brother      Social History:  reports that she has been  smoking Cigarettes.  She has a 35 pack-year smoking history. She has never used smokeless tobacco. She reports that she does not drink alcohol or use illicit drugs.  Allergies  Allergen Reactions  . Zonisamide Anxiety    Panic Attacks  . Aspirin     sensitivity  . Ciprofloxacin     Seizures & it keeps her awake  . Codeine Hives  . Hydrocodone Hives  . Oxycodone Hives  . Penicillins Hives    MEDICATIONS:                                                                                                                     Prior to Admission:  Prescriptions prior to admission  Medication Sig Dispense Refill Last Dose  . ibuprofen (ADVIL,MOTRIN) 200 MG tablet Take 200 mg by mouth every 6 (six) hours as needed for mild pain or moderate pain. Liquid-Gels   12/09/2014  . LamoTRIgine XR (LAMICTAL XR) 200 MG TB24 Take 2 tablets (400 mg total) by mouth at bedtime. 60 tablet 3 12/11/2014 at 2200  . omeprazole (PRILOSEC) 20 MG capsule Take 20 mg by mouth daily.   12/11/2014 at Unknown time  . oxcarbazepine (TRILEPTAL) 600 MG tablet Take 1 tablet (600 mg total) by mouth 2 (two) times daily. 180 tablet 1 12/11/2014 at 1900   Scheduled: . heparin  5,000 Units Subcutaneous 3 times per day  . lamoTRIgine  200 mg Oral BID  . lidocaine (cardiac) 100 mg/58ml      . oxcarbazepine  600 mg Oral BID  . potassium chloride      . sodium chloride  3 mL Intravenous Q12H     ROS:                                                                                                                                        History obtained from unobtainable from patient due to mental status    Blood pressure 142/71, pulse 96, temperature 95.9 F (35.5 C), temperature source Oral, resp. rate 20, height 5\' 2"  (1.575 m), weight 79.379 kg (175 lb), SpO2 99 %.   Neurologic Examination:  HEENT-  Normocephalic, no lesions, without obvious abnormality.  Normal external eye and conjunctiva.  Normal TM's bilaterally.  Normal auditory canals and external ears. Normal external nose, mucus membranes and septum.  Normal pharynx. Cardiovascular- S1, S2 normal, pulses palpable throughout   Lungs- chest clear, no wheezing, rales, normal symmetric air entry, Heart exam - S1, S2 normal, no murmur, no gallop, rate regular Abdomen- normal findings: bowel sounds normal Extremities- no edema Lymph-no adenopathy palpable Musculoskeletal-no joint tenderness, deformity or swelling Skin-warm and dry, no hyperpigmentation, vitiligo, or suspicious lesions  Neurological Examination Mental Status:--patient on propofol due to external pacer Patient does not respond to verbal stimuli.  Does not respond to deep sternal rub.  Does not follow commands.  No verbalizations noted. Intubated breathing over the vent Cranial Nerves: II: patient does not respond confrontation bilaterally, pupils right 2 mm, left 2 mm,and reactive bilaterally III,IV,VI: doll's response present bilaterally. . V,VII: corneal reflex present bilaterally  VIII: patient does not respond to verbal stimuli IX,X: gag reflex absent, XI: trapezius strength unable to test bilaterally XII: tongue strength unable to test Motor: Extremities flaccid throughout.  No spontaneous movement noted.  No purposeful movements noted. Sensory: Does not respond to noxious stimuli in any extremity. Deep Tendon Reflexes:  2+ bilaterally Plantars: upgoing  bilaterally Cerebellar: Unable to perform      Lab Results: Basic Metabolic Panel:  Recent Labs Lab 12/12/14 0410 12/12/14 0428  NA 135 135  K 4.4 3.5  CL 103 103  CO2  --  18*  GLUCOSE 310* 334*  BUN 11 8  CREATININE 0.90 1.26*  CALCIUM  --  7.6*    Liver Function Tests:  Recent Labs Lab 12/12/14 0428  AST 66*  ALT 39  ALKPHOS 146*  BILITOT 0.7  PROT 5.4*  ALBUMIN 3.0*   No results for input(s): LIPASE, AMYLASE in the last 168 hours. No results for input(s): AMMONIA in the last 168 hours.  CBC:  Recent Labs Lab 12/12/14 0410 12/12/14 0428  WBC  --  13.4*  NEUTROABS  --  9.4*  HGB 13.9 13.7  HCT 41.0 41.3  MCV  --  94.5  PLT  --  235    Cardiac Enzymes:  Recent Labs Lab 12/12/14 0428  TROPONINI <0.03    Lipid Panel: No results for input(s): CHOL, TRIG, HDL, CHOLHDL, VLDL, LDLCALC in the last 168 hours.  CBG:  Recent Labs Lab 12/12/14 0407 12/12/14 0836  GLUCAP 343* 116*    Microbiology: No results found for this or any previous visit.  Coagulation Studies: No results for input(s): LABPROT, INR in the last 72 hours.  Imaging: Dg Chest Portable 1 View  12/12/2014   CLINICAL DATA:  Endotracheal tube placement.  EXAM: PORTABLE CHEST - 1 VIEW  COMPARISON:  None.  FINDINGS: Endotracheal tube with tip between the clavicular heads and carina.  There is a temporary pacer lead from below which is likely at the right ventricular outflow tract. Heart size is normal. Streaky opacities in the lower lungs compatible with atelectasis. No pulmonary edema, effusion, or pneumothorax.  IMPRESSION: 1. Unremarkable positioning of endotracheal tube and temporary pacer. 2. Low lung volumes with atelectasis. 3. No cardiomegaly or pulmonary edema.   Electronically Signed   By: Monte Fantasia M.D.   On: 12/12/2014 06:15   Dg C-arm 1-60 Min-no Report  12/12/2014   CLINICAL DATA: pacemaker placement   C-ARM 1-60 MINUTES  Fluoroscopy was utilized by the  requesting physician.  No radiographic  interpretation.  Assessment and plan per attending neurologist  Etta Quill PA-C [facility] [phone]  12/12/2014, 9:33 AM   Assessment/Plan: 54 YO female with history of epilepsy and no actually seizures captured on EEG in past.  She has been on Lamictal and Trileptal with breakthrough seizures every 2 weeks.  Patient was brought to hospital for similar seizure activity, but now found to be in complete heart block with HR in 20's.  It is conceivable that some of her spells of loss of consciousness might be manifestations of syncope, rather than recurrent seizures.  At this time will not make changes in AED regime.   Recommend: 1) EEG, routine adult study 2) CT head 3) Continue current AED regime 4) Will follow.   I personally participated in this patient's evaluation and management, including formulating the above clinical impression and management recommendations.  Rush Farmer M.D. [facility] [phone]

## 2014-12-12 NOTE — ED Notes (Signed)
OG/NG attempted multiple times by 3 RNs.  MD aware.

## 2014-12-12 NOTE — Progress Notes (Signed)
Spoke with Chanetta Marshall NP with EP about order for head CT and patient has unstable temporary pacer. Amber agrees to hold off CT for now.

## 2014-12-12 NOTE — ED Notes (Signed)
MD at bedside. 

## 2014-12-12 NOTE — Progress Notes (Addendum)
PULMONARY / CRITICAL CARE MEDICINE   Name: [name] [name] MRN: [id number] DOB: 1960/11/25    ADMISSION DATE:  12/12/2014  REFERRING MD :  ED MD  CHIEF COMPLAINT:  Witnessed seizure  INITIAL PRESENTATION: Patient w/ PMH significant for seizures, LBBB, and OSA. Presented to ED after witnessed seizure involving several minutes of rigidity and post-ictal confusion. Was found to be bradycardic to the upper 20s in complete heart block. Atropine provided. Patient intubated. Cardiology was called and transvenous pacer was inserted via femoral access.   STUDIES:  5/17  Lexascan >> EF 55-65%  SIGNIFICANT EVENTS: 7/06  Admit after ? Seizure, post-ictal, found to have complete heart block, intubated.  Transvenous pacer placed   SUBJECTIVE: RN reports patient sedated on propofol.  Remains on NTG gtt for HTN.  RT weaning PEEP/FiO2  VITAL SIGNS: Temp:  [95.7 F (35.4 C)-99.7 F (37.6 C)] 99.7 F (37.6 C) (07/06 1030) Pulse Rate:  [32-111] 101 (07/06 1030) Resp:  [12-29] 18 (07/06 1030) BP: (60-165)/(31-103) 142/71 mmHg (07/06 0928) SpO2:  [79 %-100 %] 99 % (07/06 1030) Arterial Line BP: (119-241)/(59-107) 119/59 mmHg (07/06 1030) FiO2 (%):  [40 %-100 %] 40 % (07/06 0928) Weight:  [175 lb (79.379 kg)] 175 lb (79.379 kg) (07/06 0622)   HEMODYNAMICS:     VENTILATOR SETTINGS: Vent Mode:  [-] PRVC FiO2 (%):  [40 %-100 %] 40 % Set Rate:  [14 bmp-20 bmp] 20 bmp Vt Set:  [500 mL] 500 mL PEEP:  [5 cmH20-14 cmH20] 14 cmH20 Plateau Pressure:  [23 cmH20-29 cmH20] 29 cmH20   INTAKE / OUTPUT:  Intake/Output Summary (Last 24 hours) at 12/12/14 1122 Last data filed at 12/12/14 0800  Gross per 24 hour  Intake      0 ml  Output    450 ml  Net   -450 ml    PHYSICAL EXAMINATION: General: wdwn adult female in NAD, intubated HEENT: Head is normocephalic. PERRL. Ears/nose benign. MMM Neck:  supple; no bruits. Integument:  intact. No rash, erythema, or ecchymoses.  Chest:  Lungs clear to  auscultation. No crackle/wheezes noted Cardiac: RRR. No murmurs noted.  Abdomen: soft, nontender. No masses palpable. Bowel sounds present. CNS: sedated Extremities: passive ROM preserved throughout, no gross deformities. Dorsalis pedis pulses present and symmetrical.    LABS:  CBC  Recent Labs Lab 12/12/14 0410 12/12/14 0428  WBC  --  13.4*  HGB 13.9 13.7  HCT 41.0 41.3  PLT  --  235   Coag's No results for input(s): APTT, INR in the last 168 hours.   BMET  Recent Labs Lab 12/12/14 0410 12/12/14 0428  NA 135 135  K 4.4 3.5  CL 103 103  CO2  --  18*  BUN 11 8  CREATININE 0.90 1.26*  GLUCOSE 310* 334*   Electrolytes  Recent Labs Lab 12/12/14 0428  CALCIUM 7.6*   Sepsis Markers  Recent Labs Lab 12/12/14 0355 12/12/14 0430  LATICACIDVEN 0.86 4.46*   ABG  Recent Labs Lab 12/12/14 0445  PHART 7.180*  PCO2ART 48.7*  PO2ART 65.0*   Liver Enzymes  Recent Labs Lab 12/12/14 0428  AST 66*  ALT 39  ALKPHOS 146*  BILITOT 0.7  ALBUMIN 3.0*   Cardiac Enzymes  Recent Labs Lab 12/12/14 0428  TROPONINI <0.03   Glucose  Recent Labs Lab 12/12/14 0407 12/12/14 0836  GLUCAP 343* 116*    Imaging Dg Chest Portable 1 View  12/12/2014   CLINICAL DATA:  Endotracheal tube placement.  EXAM: PORTABLE  CHEST - 1 VIEW  COMPARISON:  None.  FINDINGS: Endotracheal tube with tip between the clavicular heads and carina.  There is a temporary pacer lead from below which is likely at the right ventricular outflow tract. Heart size is normal. Streaky opacities in the lower lungs compatible with atelectasis. No pulmonary edema, effusion, or pneumothorax.  IMPRESSION: 1. Unremarkable positioning of endotracheal tube and temporary pacer. 2. Low lung volumes with atelectasis. 3. No cardiomegaly or pulmonary edema.   Electronically Signed   By: Monte Fantasia M.D.   On: 12/12/2014 06:15   Dg Abd Portable 1v  12/12/2014   CLINICAL DATA:  OG tube placement.  EXAM: PORTABLE  ABDOMEN - 1 VIEW  COMPARISON:  None.  FINDINGS: OG tube noted with its tip projected over the distal stomach. No gastric distention. Right femoral venous catheter noted with tip projected over the pulmonary outflow tract. Soft tissue structures are unremarkable. No bowel distention or free air. Pelvic calcifications consistent phleboliths. Degenerative change lumbar spine and both hips.  IMPRESSION: OG tube noted with tip projected over the stomach. No gastric or bowel distention .   Electronically Signed   By: Marcello Moores  Register   On: 12/12/2014 11:15   Dg C-arm 1-60 Min-no Report  12/12/2014   CLINICAL DATA: pacemaker placement   C-ARM 1-60 MINUTES  Fluoroscopy was utilized by the requesting physician.  No radiographic  interpretation.      ASSESSMENT / PLAN:  PULMONARY OETT 7/6 >> A:  Acute Respiratory Failure - in setting of altered mental status, CHB Hx OSA Tobacco Abuse  P:   MV support, 8 cc/kg Assess CXR in am  PRN albuterol  ABG PRN  Will plan for extubation once patient meets criteria & CT head cleared.  Tthen proceed with pacemaker placement per EP    CARDIOVASCULAR R Femoral Venous Sheath 7/6 >>  A:  Complete heart block - s/p placement of transvenous pacer Cardiogenic vs obstructive shock - lactic acid 4.46, shock resolved Syncope  LBBB, Hx; no recent chest pain Recent Cardiolite: EF 55-65% P:  Cards consulted, appreciate input Trend troponin Assess ECHO Will need permanent pacemaker (per cards); EP following.  Discussed with Dr. Lovena Le.  Will plan for extubation once patient meets criteria and then proceed with pacemaker placement.   Monitor BP  RENAL A:   Metabolic acidosis, 2/2 lactic acidosis (likely some baseline acidosis w/ OSA) Acute Kidney Injury - in setting of hypoperfusion P:   Monitor BMP / UOP Replace electrolytes as indicated  NS @ 50 ml/hr for gentle hydration in setting of AKI Repeat BMP, lactic acid now   GASTROINTESTINAL A:   Abdominal  pain; post-ictal complaint prior to ED presentation Diverticulitis, Hx P:   Monitor BMs Monitor serial abdominal exams   HEMATOLOGIC A:   Leukocytosis P:  Assess for source vs stress response. Monitor fever curve / leukocytosis   INFECTIOUS A:   Possible aspiration 2/2 seizure activity P:   Monitor off abx for now   ENDOCRINE A:   Hyperglycemia - CBG 343 on presentation, resolved.  No Hx of DM.  P:   Monitor glucose on BMP  Assess TSH  NEUROLOGIC A:   Epilepsy - hx of, followed by [name] as outpatient.  Concerned this may have been cardiac related as seizures have never been documented on EEG  Possible head trauma; can not r/o at this time P:   CT head ordered to r/o bleed - Dr. Lovena Le does not want to transport the  patient due to risk of transvenous pacer dislogement Neurology consulted: appreciate the input Continue home Lamictal and Oxcarbazepine for now Assess EEG RASS goal: -1   FAMILY  - Updates: Brother updated at bedside.     Additional CC time:  32 minutes  Noe Gens, NP-C [facility] Pgr: [phone] or if no answer [phone] 12/12/2014, 11:22 AM

## 2014-12-12 NOTE — Progress Notes (Signed)
Chaplain Note:   Chaplain paged to visit with pt family at 3:52am as [name] [name] was to be intubated.   Pt's twin sister and brother present. Another sister, pt father, and a nephew just arrived.   Chaplain present throughout physician updates to family.   Pt lives in house with twin sister who has been looking after her for years as pt has been dealing with seizures and seizure like episodes for a number of years. Her sister reports that these have recently become more severe.   Medical care has been sought numerous times seeing a number of specialists; however, pt's condition hasn't become overwhelmingly better.   Pt is one of four children. Family is close and tight knit and have been cited as having a great deal of love and positive support for one another as evidenced in the interactions with one another.   Family presented as warm, conversational, and affable.   Expressed anger, confusion, and shock as they have been very good about having [name] [name] checked out and monitored. Voiced some guilt about not doing more despite having done everything responsibly and promptly.   Chaplain provided emotional support and assisted in navigating emotions and displacing unwarranted guilt.   Family in consultation room B (long hallway).   Awaiting to be notified of whether they can visit pt in the Trauma bay or wait for unit placement.   Family is very eager to see her.   Delford Field, Chaplain 12/12/2014 5:40 AM

## 2014-12-12 NOTE — ED Notes (Signed)
Pt placed on Zoll 

## 2014-12-12 NOTE — Procedures (Signed)
Arterial Catheter Insertion Procedure Note [name] [name] [id number] December 01, 1960  Procedure: Insertion of Arterial Catheter  Indications: Blood pressure monitoring and Frequent blood sampling  Procedure Details Consent: Unable to obtain consent because of emergent medical necessity. Time Out: Verified patient identification, verified procedure, site/side was marked, verified correct patient position, special equipment/implants available, medications/allergies/relevent history reviewed, required imaging and test results available.  Performed  Maximum sterile technique was used including antiseptics, cap, gloves, gown, hand hygiene, mask and sheet. Skin prep: Chlorhexidine; local anesthetic administered 20 gauge catheter was inserted into left radial artery using the Seldinger technique.  Evaluation Blood flow good; BP tracing good. Complications: No apparent complications  .   [name] [name] 12/12/2014

## 2014-12-13 ENCOUNTER — Ambulatory Visit (HOSPITAL_COMMUNITY): Payer: BLUE CROSS/BLUE SHIELD

## 2014-12-13 ENCOUNTER — Inpatient Hospital Stay (HOSPITAL_COMMUNITY): Payer: BLUE CROSS/BLUE SHIELD

## 2014-12-13 ENCOUNTER — Other Ambulatory Visit: Payer: Self-pay

## 2014-12-13 ENCOUNTER — Encounter (HOSPITAL_COMMUNITY)
Admission: EM | Disposition: A | Discharge status: Home / Self Care | Payer: BLUE CROSS/BLUE SHIELD | Source: Home / Self Care | Attending: Pulmonary Disease

## 2014-12-13 DIAGNOSIS — J9600 Acute respiratory failure, unspecified whether with hypoxia or hypercapnia: Secondary | ICD-10-CM

## 2014-12-13 DIAGNOSIS — I442 Atrioventricular block, complete: Principal | ICD-10-CM

## 2014-12-13 DIAGNOSIS — M7989 Other specified soft tissue disorders: Secondary | ICD-10-CM

## 2014-12-13 DIAGNOSIS — R4182 Altered mental status, unspecified: Secondary | ICD-10-CM

## 2014-12-13 HISTORY — PX: EP IMPLANTABLE DEVICE: SHX172B

## 2014-12-13 LAB — BASIC METABOLIC PANEL
Anion gap: 7 (ref 5–15)
BUN: 7 mg/dL (ref 6–20)
CHLORIDE: 111 mmol/L (ref 101–111)
CO2: 21 mmol/L — AB (ref 22–32)
Calcium: 8.1 mg/dL — ABNORMAL LOW (ref 8.9–10.3)
Creatinine, Ser: 0.86 mg/dL (ref 0.44–1.00)
GFR calc Af Amer: >60 mL/min (ref >60)
GFR calc non Af Amer: >60 mL/min (ref >60)
GLUCOSE: 105 mg/dL — AB (ref 65–99)
POTASSIUM: 3.5 mmol/L (ref 3.5–5.1)
Sodium: 139 mmol/L (ref 135–145)

## 2014-12-13 LAB — GLUCOSE, CAPILLARY
GLUCOSE-CAPILLARY: 121 mg/dL — AB (ref 65–99)
GLUCOSE-CAPILLARY: 78 mg/dL (ref 65–99)
Glucose-Capillary: 113 mg/dL — ABNORMAL HIGH (ref 65–99)
Glucose-Capillary: 125 mg/dL — ABNORMAL HIGH (ref 65–99)

## 2014-12-13 LAB — CBC
HCT: 39.1 % (ref 36.0–46.0)
Hemoglobin: 13.2 g/dL (ref 12.0–15.0)
MCH: 31.3 pg (ref 26.0–34.0)
MCHC: 33.8 g/dL (ref 30.0–36.0)
MCV: 92.7 fL (ref 78.0–100.0)
Platelets: 222 10*3/uL (ref 150–400)
RBC: 4.22 MIL/uL (ref 3.87–5.11)
RDW: 14.6 % (ref 11.5–15.5)
WBC: 11.4 10*3/uL — ABNORMAL HIGH (ref 4.0–10.5)

## 2014-12-13 LAB — LAMOTRIGINE LEVEL: Lamotrigine Lvl: 5 ug/mL (ref 2.0–20.0)

## 2014-12-13 SURGERY — PACEMAKER IMPLANT
Anesthesia: LOCAL

## 2014-12-13 MED ORDER — GENTAMICIN SULFATE 40 MG/ML IJ SOLN
80.0000 mg | INTRAMUSCULAR | Status: DC
  Filled 2014-12-13: qty 2

## 2014-12-13 MED ORDER — LIDOCAINE HCL (PF) 1 % IJ SOLN
INTRAMUSCULAR | Status: DC | PRN
Start: 2014-12-13 — End: 2014-12-13
  Administered 2014-12-13: 60 mL via SUBCUTANEOUS

## 2014-12-13 MED ORDER — LIDOCAINE HCL (PF) 1 % IJ SOLN
INTRAMUSCULAR | Status: AC
  Filled 2014-12-13: qty 60

## 2014-12-13 MED ORDER — HYDRALAZINE HCL 20 MG/ML IJ SOLN: 10.0000 mg | INTRAMUSCULAR | Status: DC | PRN

## 2014-12-13 MED ORDER — METOPROLOL TARTRATE 1 MG/ML IV SOLN
INTRAVENOUS | Status: DC | PRN
  Administered 2014-12-13: 5 mg via INTRAVENOUS

## 2014-12-13 MED ORDER — VANCOMYCIN HCL IN DEXTROSE 1-5 GM/200ML-% IV SOLN
1000.0000 mg | INTRAVENOUS | Status: AC
  Administered 2014-12-13: 1000 mg via INTRAVENOUS
  Filled 2014-12-13: qty 200

## 2014-12-13 MED ORDER — VANCOMYCIN HCL IN DEXTROSE 1-5 GM/200ML-% IV SOLN
1000.0000 mg | Freq: Two times a day (BID) | INTRAVENOUS | Status: AC
  Administered 2014-12-14: 1000 mg via INTRAVENOUS
  Filled 2014-12-13: qty 200

## 2014-12-13 MED ORDER — ACETAMINOPHEN 325 MG PO TABS
325.0000 mg | ORAL_TABLET | ORAL | Status: DC | PRN
  Administered 2014-12-13 – 2014-12-16 (×7): 650 mg via ORAL
  Filled 2014-12-13 (×7): qty 2

## 2014-12-13 MED ORDER — CHLORHEXIDINE GLUCONATE 4 % EX LIQD
60.0000 mL | Freq: Once | CUTANEOUS | Status: DC
  Filled 2014-12-13: qty 60

## 2014-12-13 MED ORDER — SODIUM CHLORIDE 0.9 % IV SOLN
INTRAVENOUS | Status: DC
  Administered 2014-12-13: 13:00:00 via INTRAVENOUS

## 2014-12-13 MED ORDER — FENTANYL CITRATE (PF) 100 MCG/2ML IJ SOLN
INTRAMUSCULAR | Status: AC
  Filled 2014-12-13: qty 2

## 2014-12-13 MED ORDER — METOPROLOL TARTRATE 25 MG PO TABS
25.0000 mg | ORAL_TABLET | Freq: Two times a day (BID) | ORAL | Status: DC
  Administered 2014-12-13 – 2014-12-17 (×8): 25 mg via ORAL
  Filled 2014-12-13 (×9): qty 1

## 2014-12-13 MED ORDER — ONDANSETRON HCL 4 MG/2ML IJ SOLN: 4.0000 mg | Freq: Four times a day (QID) | INTRAMUSCULAR | Status: DC | PRN

## 2014-12-13 MED ORDER — FENTANYL CITRATE (PF) 100 MCG/2ML IJ SOLN
INTRAMUSCULAR | Status: DC | PRN
  Administered 2014-12-13: 25 ug via INTRAVENOUS

## 2014-12-13 MED ORDER — HEPARIN (PORCINE) IN NACL 2-0.9 UNIT/ML-% IJ SOLN
INTRAMUSCULAR | Status: DC | PRN
  Administered 2014-12-13: 15:00:00

## 2014-12-13 MED ORDER — CHLORHEXIDINE GLUCONATE 4 % EX LIQD: 60.0000 mL | Freq: Once | CUTANEOUS | Status: DC

## 2014-12-13 MED ORDER — FAMOTIDINE 40 MG/5ML PO SUSR
20.0000 mg | Freq: Every day | ORAL | Status: DC
  Administered 2014-12-13 – 2014-12-16 (×4): 20 mg via ORAL
  Filled 2014-12-13 (×5): qty 2.5

## 2014-12-13 MED ORDER — LABETALOL HCL 5 MG/ML IV SOLN
10.0000 mg | Freq: Once | INTRAVENOUS | Status: AC
  Administered 2014-12-14: 10 mg via INTRAVENOUS
  Filled 2014-12-13: qty 4

## 2014-12-13 MED ORDER — MIDAZOLAM HCL 5 MG/5ML IJ SOLN
INTRAMUSCULAR | Status: DC | PRN
Start: 2014-12-13 — End: 2014-12-13
  Administered 2014-12-13: 1 mg via INTRAVENOUS

## 2014-12-13 MED ORDER — MIDAZOLAM HCL 5 MG/5ML IJ SOLN
INTRAMUSCULAR | Status: AC
  Filled 2014-12-13: qty 5

## 2014-12-13 MED ORDER — SODIUM CHLORIDE 0.9 % IR SOLN
Status: AC
  Filled 2014-12-13: qty 2

## 2014-12-13 MED ORDER — SODIUM CHLORIDE 0.9 % IV SOLN: INTRAVENOUS | Status: DC

## 2014-12-13 MED ORDER — METOPROLOL TARTRATE 1 MG/ML IV SOLN
INTRAVENOUS | Status: AC
  Filled 2014-12-13: qty 5

## 2014-12-13 MED ORDER — HEPARIN (PORCINE) IN NACL 2-0.9 UNIT/ML-% IJ SOLN: INTRAMUSCULAR | Status: DC | PRN

## 2014-12-13 MED ORDER — SODIUM CHLORIDE 0.9 % IV SOLN
INTRAVENOUS | Status: AC
  Administered 2014-12-13: 18:00:00 via INTRAVENOUS

## 2014-12-13 SURGICAL SUPPLY — 6 items
CABLE SURGICAL S-101-97-12 (CABLE) ×2 IMPLANT
LEAD CAPSURE NOVUS 5076-52CM (Lead) ×2 IMPLANT
LEAD CAPSURE NOVUS 5076-58CM (Lead) ×2 IMPLANT
PPM ADVISA MRI DR A2DR01 (Pacemaker) ×2 IMPLANT
SHEATH CLASSIC 7F (SHEATH) ×4 IMPLANT
TRAY PACEMAKER INSERTION (CUSTOM PROCEDURE TRAY) ×2 IMPLANT

## 2014-12-13 NOTE — Progress Notes (Signed)
VASCULAR LAB PRELIMINARY  PRELIMINARY  PRELIMINARY  PRELIMINARY  Right upper extremity venous Doppler completed.    Preliminary report:  There is no DVT noted in the right upper extremity.  There is superficial thrombosis noted throughout the cephalic and the basilic veins of the right upper extremity.  The basilic SVT extends to just before the confluence with the axillary vein.  Margart Zemanek, RVT 12/13/2014, 1:49 PM

## 2014-12-13 NOTE — Procedures (Signed)
Extubation Procedure Note  Patient Details:   Name: [name] [name] DOB: [birth year] MRN: [id number]   Airway Documentation:     Evaluation  O2 sats: stable throughout Complications: No apparent complications Patient did tolerate procedure well. Bilateral Breath Sounds: Rhonchi, Clear Suctioning: Airway Yes   Pt tolerated extubation procedure well. Pt was placed on 3L [place] post extubation, O2 saturations are 100%. Pt was able to speak and had an effective cough post extubation.    [name] 12/13/2014, 10:45 AM

## 2014-12-13 NOTE — Progress Notes (Signed)
CTSP for pleuritic pain that started after cughing   It localized to the right side of her sterunm and was point tender reproducing her discomfort.  Splinting of that site prevented pleuritic pain Echo>> no effusion in the 4C and [place] views BP 170 and HR 100 but noted to have atrial tach in the lab Will repeat echo in am Suspect rib fracture 2/2 cough

## 2014-12-13 NOTE — Progress Notes (Signed)
Patient transferred to 2-West.   Roxan Hockey, RN

## 2014-12-13 NOTE — Progress Notes (Signed)
Patient Name: [name] [name]      SUBJECTIVE: sedated but responds voice  Past Medical History  Diagnosis Date  . Seizures   . Diverticulitis   . GERD (gastroesophageal reflux disease)   . Hiatal hernia   . Wrist tendonitis     R   . Panic disorder 09/07/2014  . OSA on CPAP   . LBBB (left bundle branch block)     Scheduled Meds:  Scheduled Meds: . antiseptic oral rinse  7 mL Mouth Rinse QID  . chlorhexidine  15 mL Mouth Rinse BID  . famotidine (PEPCID) IV  20 mg Intravenous Q24H  . heparin  5,000 Units Subcutaneous 3 times per day  . lamoTRIgine  200 mg Per NG tube BID  . oxcarbazepine  600 mg Per NG tube BID  . sodium chloride  3 mL Intravenous Q12H   Continuous Infusions: . sodium chloride 50 mL/hr at 12/12/14 2000  . nitroGLYCERIN 50 mcg/min (12/13/14 0000)  . propofol (DIPRIVAN) infusion 50 mcg/kg/min (12/13/14 0634)   albuterol, fentaNYL (SUBLIMAZE) injection, fentaNYL (SUBLIMAZE) injection, hydrALAZINE    PHYSICAL EXAM Filed Vitals:   12/13/14 0500 12/13/14 0600 12/13/14 0700 12/13/14 0742  BP: 117/48 129/55 136/49 139/92  Pulse: 81 77 78 84  Temp: 99.5 F (37.5 C) 99.7 F (37.6 C) 99.9 F (37.7 C)   TempSrc:      Resp: 20 21 20 20   Height:      Weight: 181 lb (82.1 kg)     SpO2: 98% 100% 100% 100%    General appearance: alert, mild distress, slowed mentation and intubated and sedated Lungs: clear anteriorly Heart: regular rate and rhythm, S1, S2 normal, no murmur, click, rub or gallop Abdomen: soft, non-tender; bowel sounds normal; no masses,  no organomegaly Extremities: no edema, redness or tenderness in the calves or thighs Skin: Skin color, texture, turgor normal. No rashes or lesions Neurologic:sedated and slow  TELEMETRY: Reviewed telemetry pt in *sinus with intermittent complete heart block    Intake/Output Summary (Last 24 hours) at 12/13/14 0817 Last data filed at 12/13/14 0600  Gross per 24 hour  Intake 2042.68 ml    Output   2090 ml  Net -47.32 ml    LABS: Basic Metabolic Panel:  Recent Labs Lab 12/12/14 0410  12/12/14 0428 12/12/14 1206 12/13/14 0400  NA 135  --  135 137 139  K 4.4  --  3.5 4.3 3.5  CL 103  --  103 108 111  CO2  --   --  18* 21* 21*  GLUCOSE 310*  --  334* 96 105*  BUN 11  --  8 8 7   CREATININE 0.90  --  1.26* 0.92 0.86  CALCIUM  --   < > 7.6* 8.2* 8.1*  < > = values in this interval not displayed. Cardiac Enzymes:  Recent Labs  12/12/14 0428 12/12/14 1130 12/12/14 1820  TROPONINI <0.03 0.16* 0.08*   CBC:  Recent Labs Lab 12/12/14 0410 12/12/14 0428 12/13/14 0400  WBC  --  13.4* 11.4*  NEUTROABS  --  9.4*  --   HGB 13.9 13.7 13.2  HCT 41.0 41.3 39.1  MCV  --  94.5 92.7  PLT  --  235 222   PROTIME: No results for input(s): LABPROT, INR in the last 72 hours. Liver Function Tests:  Recent Labs  12/12/14 0428  AST 66*  ALT 39  ALKPHOS 146*  BILITOT 0.7  PROT 5.4*  ALBUMIN  3.0*   No results for input(s): LIPASE, AMYLASE in the last 72 hours. BNP: BNP (last 3 results) No results for input(s): BNP in the last 8760 hours.  ProBNP (last 3 results) No results for input(s): PROBNP in the last 8760 hours.  D-Dimer: No results for input(s): DDIMER in the last 72 hours. Hemoglobin A1C: No results for input(s): HGBA1C in the last 72 hours. Fasting Lipid Panel:  Recent Labs  12/12/14 0907  TRIG 178*   Thyroid Function Tests:  Recent Labs  12/12/14 1207  TSH 1.399   Anemia Panel: No results for input(s): VITAMINB12, FOLATE, FERRITIN, TIBC, IRON, RETICCTPCT in the last 72 hours.   Device Interrogation: threhsold < 2 amps   ASSESSMENT AND PLAN:  Active Problems:   Bradycardia   Acute respiratory failure   Complete heart block   Convulsion   Symptomatic bradycardia   Seizures  Pt still sedated and not ready for pacing ( unless emergent)  Stable temp in R leg Reviewed with family need for pacing and issues of  timing  Signed, Virl Axe MD  12/13/2014

## 2014-12-13 NOTE — Progress Notes (Signed)
Per post cath report orders, Left femoral sheath to stay in for 6 hours. May pull sheath in 6 hours if there are no pauses on the monitor. Sheath can be pulled at 2130 is there are no pauses.   Roxan Hockey, RN

## 2014-12-13 NOTE — Progress Notes (Signed)
Subjective: No further events over night. Patient is awake but intubated.  She is clearly frustrated with having the breathing tube in place.   Objective: Current vital signs: BP 139/92 mmHg  Pulse 84  Temp(Src) 99.9 F (37.7 C) (Core (Comment))  Resp 20  Ht 5\' 2"  (1.575 m)  Wt 82.1 kg (181 lb)  BMI 33.10 kg/m2  SpO2 100% Vital signs in last 24 hours: Temp:  [98.2 F (36.8 C)-101.5 F (38.6 C)] 99.9 F (37.7 C) (07/07 0829) Pulse Rate:  [74-101] 84 (07/07 0742) Resp:  [18-25] 20 (07/07 0742) BP: (92-142)/(40-92) 139/92 mmHg (07/07 0742) SpO2:  [98 %-100 %] 100 % (07/07 0742) Arterial Line BP: (88-217)/(48-83) 126/82 mmHg (07/07 0700) FiO2 (%):  [40 %] 40 % (07/07 0742) Weight:  [82.1 kg (181 lb)] 82.1 kg (181 lb) (07/07 0500)  Intake/Output from previous day: 07/06 0701 - 07/07 0700 In: 2106.1 [I.V.:1946.1; NG/GT:60; IV Piggyback:100] Out: 2240 [Urine:2240] Intake/Output this shift:   Nutritional status: Diet NPO time specified  Neurologic Exam: General: Mental Status: Alert, oriented, thought content appropriate.  intubated.  Able to follow 3 step commands without difficulty. Cranial Nerves: II:  Blinks to threat bilaterally, pupils equal, round, reactive to light and accommodation III,IV, VI: ptosis not present, extra-ocular motions intact bilaterally V,VII: face symmetric, facial light touch sensation normal bilaterally VIII: hearing normal bilaterally IX,X: uvula rises symmetrically XI: bilateral shoulder shrug   Motor: Right : Upper extremity   5/5    Left:     Upper extremity   5/5  Lower extremity   5/5     Lower extremity   5/5 Tone and bulk:normal tone throughout; no atrophy noted Sensory: Pinprick and light touch intact throughout, bilaterally Deep Tendon Reflexes:  2+ throughout   Plantars: Right: downgoing   Left: downgoing Cerebellar: normal finger-to-nose,    Lab Results: Basic Metabolic Panel:  Recent Labs Lab 12/12/14 0410  12/12/14 0428 12/12/14 1206 12/13/14 0400  NA 135 135 137 139  K 4.4 3.5 4.3 3.5  CL 103 103 108 111  CO2  --  18* 21* 21*  GLUCOSE 310* 334* 96 105*  BUN 11 8 8 7   CREATININE 0.90 1.26* 0.92 0.86  CALCIUM  --  7.6* 8.2* 8.1*    Liver Function Tests:  Recent Labs Lab 12/12/14 0428  AST 66*  ALT 39  ALKPHOS 146*  BILITOT 0.7  PROT 5.4*  ALBUMIN 3.0*   No results for input(s): LIPASE, AMYLASE in the last 168 hours. No results for input(s): AMMONIA in the last 168 hours.  CBC:  Recent Labs Lab 12/12/14 0410 12/12/14 0428 12/13/14 0400  WBC  --  13.4* 11.4*  NEUTROABS  --  9.4*  --   HGB 13.9 13.7 13.2  HCT 41.0 41.3 39.1  MCV  --  94.5 92.7  PLT  --  235 222    Cardiac Enzymes:  Recent Labs Lab 12/12/14 0428 12/12/14 1130 12/12/14 1820  TROPONINI <0.03 0.16* 0.08*    Lipid Panel:  Recent Labs Lab 12/12/14 0907  TRIG 178*    CBG:  Recent Labs Lab 12/12/14 1641 12/12/14 2032 12/13/14 0012 12/13/14 0503 12/13/14 0831  GLUCAP 103* 116* 61 121* 113*    Microbiology: Results for orders placed or performed during the hospital encounter of 12/12/14  MRSA PCR Screening     Status: None   Collection Time: 12/12/14  8:00 AM  Result Value Ref Range Status   MRSA by PCR NEGATIVE NEGATIVE Final  Comment:        The GeneXpert MRSA Assay (FDA approved for NASAL specimens only), is one component of a comprehensive MRSA colonization surveillance program. It is not intended to diagnose MRSA infection nor to guide or monitor treatment for MRSA infections.     Coagulation Studies: No results for input(s): LABPROT, INR in the last 72 hours.  Imaging: Dg Chest Port 1 View  12/13/2014   CLINICAL DATA:  Follow-up of respiratory failure  EXAM: PORTABLE CHEST - 1 VIEW  COMPARISON:  Portable chest x-ray of December 12, 2014  FINDINGS: The lungs are adequately inflated. Confluent alveolar opacity is more conspicuous in the right infrahilar region. There  is no pleural effusion or pneumothorax. The heart and pulmonary vascularity are normal. The endotracheal tube tip lies 3.7 cm above the carina. The esophagogastric tube tip projects below the inferior margin of the study. External pacemaker defibrillator pads are present and stable. A temporary pacemaker lead is present in the right ventricle and appears stable.  IMPRESSION: Slight increase conspicuity of right infrahilar alveolar opacity is consistent with pneumonia. There is no CHF. The support tubes are in reasonable position.   Electronically Signed   By: David  Martinique M.D.   On: 12/13/2014 07:35   Dg Chest Portable 1 View  12/12/2014   CLINICAL DATA:  Endotracheal tube placement.  EXAM: PORTABLE CHEST - 1 VIEW  COMPARISON:  None.  FINDINGS: Endotracheal tube with tip between the clavicular heads and carina.  There is a temporary pacer lead from below which is likely at the right ventricular outflow tract. Heart size is normal. Streaky opacities in the lower lungs compatible with atelectasis. No pulmonary edema, effusion, or pneumothorax.  IMPRESSION: 1. Unremarkable positioning of endotracheal tube and temporary pacer. 2. Low lung volumes with atelectasis. 3. No cardiomegaly or pulmonary edema.   Electronically Signed   By: Monte Fantasia M.D.   On: 12/12/2014 06:15   Dg Abd Portable 1v  12/12/2014   CLINICAL DATA:  OG tube placement.  EXAM: PORTABLE ABDOMEN - 1 VIEW  COMPARISON:  None.  FINDINGS: OG tube noted with its tip projected over the distal stomach. No gastric distention. Right femoral venous catheter noted with tip projected over the pulmonary outflow tract. Soft tissue structures are unremarkable. No bowel distention or free air. Pelvic calcifications consistent phleboliths. Degenerative change lumbar spine and both hips.  IMPRESSION: OG tube noted with tip projected over the stomach. No gastric or bowel distention .   Electronically Signed   By: Marcello Moores  Register   On: 12/12/2014 11:15   Dg  C-arm 1-60 Min-no Report  12/12/2014   CLINICAL DATA: pacemaker placement   C-ARM 1-60 MINUTES  Fluoroscopy was utilized by the requesting physician.  No radiographic  interpretation.     Medications:  Scheduled: . antiseptic oral rinse  7 mL Mouth Rinse QID  . chlorhexidine  15 mL Mouth Rinse BID  . famotidine (PEPCID) IV  20 mg Intravenous Q24H  . heparin  5,000 Units Subcutaneous 3 times per day  . lamoTRIgine  200 mg Per NG tube BID  . oxcarbazepine  600 mg Per NG tube BID  . sodium chloride  3 mL Intravenous Q12H    Assessment/Plan: 54 YO female with history of epilepsy and no actually seizures captured on EEG in past. She has been on Lamictal and Trileptal with breakthrough seizures every 2 weeks. Patient was brought to hospital for similar seizure activity, but now found to be in complete heart  block with HR in 20's. It is conceivable that some of her spells of loss of consciousness might be manifestations of syncope, rather than recurrent seizures. At this time will not make changes in AED regime.   EEG showed no epileptiform activity.  CT head not feasible at this time as cardiology does not want patient moved for fear of dislodging external pacer wires.  Would recommend getting this once stable.   At this time will follow from a far.  Call with any questions.    Etta Quill PA-C [facility] [phone]  12/13/2014, 9:03 AM  I personally participated in this patient's evaluation and management, including warm 11 above clinical impression and management recommendations.  Rush Farmer M.D. [facility] [phone]

## 2014-12-13 NOTE — Progress Notes (Signed)
Patient complaining of mid localized chest pain on inhalation that she says started after she coughed. Rate 7/10. EKG obtained. Dr. Caryl Comes paged. Dr. Caryl Comes came to bedside to personally assess patient. Patient denied pain when area was splinted. Dr. Caryl Comes did personal ultrasound at bedside. May do 2D-Echo. Pacer interrogated. Patient given tylenol for pain. Will continue to monitor.   Roxan Hockey, RN

## 2014-12-13 NOTE — Progress Notes (Addendum)
Pt now extubated and alert  She unfortunately has lost antegrade conduction again and is pacing HR <30  Will proceed wityh permanent pacemaker implantation today  The benefits and risks were reviewed including but not limited to death,  perforation, infection, lead dislodgement and device malfunction.  The patient understands agrees and is willing to proceed.   Also swelling of the right arm.  Will have to place pacemaker on the left arm, nothwithstanding that she is left handed

## 2014-12-13 NOTE — Progress Notes (Signed)
Orthopedic Tech Progress Note Patient Details:  [name] Mar 21, 1961 [id number] Patient already has arm sling on. Patient ID: [name] [name], female   DOB: Sep 11, 1960, 55 y.o.   MRN: [id number]   Braulio Bosch 12/13/2014, 4:26 PM

## 2014-12-13 NOTE — Progress Notes (Signed)
Patient stated pain down to 3/10 after tylenol.   Roxan Hockey, RN

## 2014-12-13 NOTE — Progress Notes (Signed)
PULMONARY / CRITICAL CARE MEDICINE   Name: [name] [name] MRN: [id number] DOB: Nov 20, 1960    ADMISSION DATE:  12/12/2014  REFERRING MD :  ED MD  CHIEF COMPLAINT:  Witnessed seizure  INITIAL PRESENTATION: Patient w/ PMH significant for seizures, LBBB, and OSA. Presented to ED after witnessed seizure involving several minutes of rigidity and post-ictal confusion. Was found to be bradycardic to the upper 20s in complete heart block. Atropine provided. Patient intubated. Cardiology was called and transvenous pacer was inserted via femoral access.   STUDIES:  5/17  Lexascan >> EF 55-65%  SIGNIFICANT EVENTS: 7/06  Admit after ? Seizure, post-ictal, found to have complete heart block, intubated.  Transvenous pacer placed   SUBJECTIVE:awake, follows commands, neuro signing off  VITAL SIGNS: Temp:  [99.5 F (37.5 C)-101.5 F (38.6 C)] 99.9 F (37.7 C) (07/07 1000) Pulse Rate:  [74-101] 95 (07/07 1000) Resp:  [15-25] 17 (07/07 1000) BP: (92-150)/(40-92) 150/54 mmHg (07/07 1000) SpO2:  [98 %-100 %] 100 % (07/07 1000) Arterial Line BP: (88-217)/(48-88) 173/88 mmHg (07/07 1000) FiO2 (%):  [40 %] 40 % (07/07 0942) Weight:  [82.1 kg (181 lb)] 82.1 kg (181 lb) (07/07 0500)   HEMODYNAMICS:     VENTILATOR SETTINGS: Vent Mode:  [-] PSV;CPAP FiO2 (%):  [40 %] 40 % Set Rate:  [20 bmp] 20 bmp Vt Set:  [500 mL] 500 mL PEEP:  [5 cmH20-8 cmH20] 5 cmH20 Pressure Support:  [10 cmH20] 10 cmH20 Plateau Pressure:  [9 cmH20-22 cmH20] 9 cmH20   INTAKE / OUTPUT:  Intake/Output Summary (Last 24 hours) at 12/13/14 1022 Last data filed at 12/13/14 0900  Gross per 24 hour  Intake 2067.88 ml  Output   2360 ml  Net -292.12 ml    PHYSICAL EXAMINATION: General: wdwn adult female in NAD, intubated HEENT: jvd wnl Neck:  supple; no bruits. Chest:  Lungs clear to auscultation. No crackle/wheezes noted Cardiac:S1 S2 RRR, not paced Abdomen: soft, nontender. No masses palpable. Bowel sounds  present. CNS: sedated, rass 0, follows commands Extremities: edema nonpit gen   LABS:  CBC  Recent Labs Lab 12/12/14 0410 12/12/14 0428 12/13/14 0400  WBC  --  13.4* 11.4*  HGB 13.9 13.7 13.2  HCT 41.0 41.3 39.1  PLT  --  235 222   Coag's No results for input(s): APTT, INR in the last 168 hours.   BMET  Recent Labs Lab 12/12/14 0428 12/12/14 1206 12/13/14 0400  NA 135 137 139  K 3.5 4.3 3.5  CL 103 108 111  CO2 18* 21* 21*  BUN 8 8 7   CREATININE 1.26* 0.92 0.86  GLUCOSE 334* 96 105*   Electrolytes  Recent Labs Lab 12/12/14 0428 12/12/14 1206 12/13/14 0400  CALCIUM 7.6* 8.2* 8.1*   Sepsis Markers  Recent Labs Lab 12/12/14 0355 12/12/14 0430 12/12/14 1209  LATICACIDVEN 0.86 4.46* 1.3   ABG  Recent Labs Lab 12/12/14 0445 12/12/14 1124  PHART 7.180* 7.416  PCO2ART 48.7* 31.2*  PO2ART 65.0* 128.0*   Liver Enzymes  Recent Labs Lab 12/12/14 0428  AST 66*  ALT 39  ALKPHOS 146*  BILITOT 0.7  ALBUMIN 3.0*   Cardiac Enzymes  Recent Labs Lab 12/12/14 0428 12/12/14 1130 12/12/14 1820  TROPONINI <0.03 0.16* 0.08*   Glucose  Recent Labs Lab 12/12/14 1255 12/12/14 1641 12/12/14 2032 12/13/14 0012 12/13/14 0503 12/13/14 0831  GLUCAP 104* 103* 116* 78 121* 113*    Imaging Dg Chest Port 1 View  12/13/2014   CLINICAL DATA:  Follow-up of respiratory failure  EXAM: PORTABLE CHEST - 1 VIEW  COMPARISON:  Portable chest x-ray of December 12, 2014  FINDINGS: The lungs are adequately inflated. Confluent alveolar opacity is more conspicuous in the right infrahilar region. There is no pleural effusion or pneumothorax. The heart and pulmonary vascularity are normal. The endotracheal tube tip lies 3.7 cm above the carina. The esophagogastric tube tip projects below the inferior margin of the study. External pacemaker defibrillator pads are present and stable. A temporary pacemaker lead is present in the right ventricle and appears stable.  IMPRESSION:  Slight increase conspicuity of right infrahilar alveolar opacity is consistent with pneumonia. There is no CHF. The support tubes are in reasonable position.   Electronically Signed   By: David  Martinique M.D.   On: 12/13/2014 07:35   Dg Abd Portable 1v  12/12/2014   CLINICAL DATA:  OG tube placement.  EXAM: PORTABLE ABDOMEN - 1 VIEW  COMPARISON:  None.  FINDINGS: OG tube noted with its tip projected over the distal stomach. No gastric distention. Right femoral venous catheter noted with tip projected over the pulmonary outflow tract. Soft tissue structures are unremarkable. No bowel distention or free air. Pelvic calcifications consistent phleboliths. Degenerative change lumbar spine and both hips.  IMPRESSION: OG tube noted with tip projected over the stomach. No gastric or bowel distention .   Electronically Signed   By: Marcello Moores  Register   On: 12/12/2014 11:15     ASSESSMENT / PLAN:  PULMONARY OETT 7/6 >> A:  Acute Respiratory Failure - in setting of altered mental status, CHB Hx OSA Tobacco Abuse  P:   MV support, 8 cc/kg Wean this am failed per RT Repeat weaning cpap 5 ps5, goal 30 min repeat pcxr with slight int changes upright Treat anxiety  CARDIOVASCULAR R Femoral Venous Sheath 7/6 >>  A:  Complete heart block - s/p placement of transvenous pacer Cardiogenic vs obstructive shock - lactic acid 4.46, shock resolved Syncope  LBBB, Hx; no recent chest pain Recent Cardiolite: EF 55-65% Echo 7/6- ef wnl P: tele Will need permanent pacemaker (per cards); EP following. Monitor BP Dc nitro, add hydral; for BP control  RENAL A:   Metabolic acidosis, 2/2 lactic acidosis (likely some baseline acidosis w/ OSA) Acute Kidney Injury - in setting of hypoperfusion P:   Saline at 50 bmet in am  Consider kvo  GASTROINTESTINAL A:   Abdominal pain; post-ictal complaint prior to ED presentation Diverticulitis, Hx P:   Monitor BMs Monitor serial abdominal exams  Hold feeding as  likelly to extubate  HEMATOLOGIC A:   Leukocytosis P:  PCXR in future for rt base  INFECTIOUS A:   Possible aspiration 2/2 seizure activity P:   Monitor off abx for now   ENDOCRINE A:   Hyperglycemia - CBG 343 on presentation, resolved.  No Hx of DM.  P:   Monitor glucose on BMP  Assess TSH - 1.3  NEUROLOGIC A:   Epilepsy - hx of, followed by [name] as outpatient.  Concerned this may have been cardiac related as seizures have never been documented on EEG  Possible head trauma; can not r/o at this time P:   Awake follows commands, no need CT head RASS goal: -1   FAMILY  - Updates: Brother updated at bedside.     Ccm time 35 min   Lavon Paganini. Titus Mould, MD, [place] Pgr: [facility]

## 2014-12-13 NOTE — Progress Notes (Signed)
On call CCM paged, left arm swollen/tight on return back from cath lab. No previously swollen. Capillary refill < 3 seconds. MD made aware. Will remove A-line from that arm, wave form damp anyways . If arm is still swollen in a.m will get duplex study of left arm.   Roxan Hockey, RN

## 2014-12-14 ENCOUNTER — Inpatient Hospital Stay (HOSPITAL_COMMUNITY): Payer: BLUE CROSS/BLUE SHIELD

## 2014-12-14 ENCOUNTER — Encounter (HOSPITAL_COMMUNITY): Payer: Self-pay | Admitting: Internal Medicine

## 2014-12-14 DIAGNOSIS — I313 Pericardial effusion (noninflammatory): Secondary | ICD-10-CM

## 2014-12-14 DIAGNOSIS — Z959 Presence of cardiac and vascular implant and graft, unspecified: Secondary | ICD-10-CM

## 2014-12-14 DIAGNOSIS — R609 Edema, unspecified: Secondary | ICD-10-CM

## 2014-12-14 LAB — COMPREHENSIVE METABOLIC PANEL
ALT: 25 U/L (ref 14–54)
AST: 19 U/L (ref 15–41)
Albumin: 3.2 g/dL — ABNORMAL LOW (ref 3.5–5.0)
Alkaline Phosphatase: 144 U/L — ABNORMAL HIGH (ref 38–126)
Anion gap: 7 (ref 5–15)
BUN: 7 mg/dL (ref 6–20)
CO2: 21 mmol/L — AB (ref 22–32)
Calcium: 8.5 mg/dL — ABNORMAL LOW (ref 8.9–10.3)
Chloride: 111 mmol/L (ref 101–111)
Creatinine, Ser: 0.8 mg/dL (ref 0.44–1.00)
GFR calc Af Amer: >60 mL/min (ref >60)
GFR calc non Af Amer: >60 mL/min (ref >60)
Glucose, Bld: 102 mg/dL — ABNORMAL HIGH (ref 65–99)
POTASSIUM: 3.8 mmol/L (ref 3.5–5.1)
SODIUM: 139 mmol/L (ref 135–145)
TOTAL PROTEIN: 6.4 g/dL — AB (ref 6.5–8.1)
Total Bilirubin: 0.9 mg/dL (ref 0.3–1.2)

## 2014-12-14 LAB — GLUCOSE, CAPILLARY
GLUCOSE-CAPILLARY: 96 mg/dL (ref 65–99)
GLUCOSE-CAPILLARY: 107 mg/dL — AB (ref 65–99)
Glucose-Capillary: 95 mg/dL (ref 65–99)
Glucose-Capillary: 96 mg/dL (ref 65–99)
Glucose-Capillary: 111 mg/dL — ABNORMAL HIGH (ref 65–99)

## 2014-12-14 LAB — CBC WITH DIFFERENTIAL/PLATELET
BASOS PCT: 0 % (ref 0–1)
Basophils Absolute: 0 10*3/uL (ref 0.0–0.1)
Eosinophils Absolute: 0 10*3/uL (ref 0.0–0.7)
Eosinophils Relative: 0 % (ref 0–5)
HEMATOCRIT: 42.2 % (ref 36.0–46.0)
HEMOGLOBIN: 13.8 g/dL (ref 12.0–15.0)
LYMPHS PCT: 17 % (ref 12–46)
Lymphs Abs: 2.3 10*3/uL (ref 0.7–4.0)
MCH: 30.7 pg (ref 26.0–34.0)
MCHC: 32.7 g/dL (ref 30.0–36.0)
MCV: 94 fL (ref 78.0–100.0)
MONOS PCT: 7 % (ref 3–12)
Monocytes Absolute: 0.9 10*3/uL (ref 0.1–1.0)
NEUTROS ABS: 10.3 10*3/uL — AB (ref 1.7–7.7)
Neutrophils Relative %: 76 % (ref 43–77)
Platelets: 176 10*3/uL (ref 150–400)
RBC: 4.49 MIL/uL (ref 3.87–5.11)
RDW: 14.6 % (ref 11.5–15.5)
WBC: 13.6 10*3/uL — ABNORMAL HIGH (ref 4.0–10.5)

## 2014-12-14 MED ORDER — HYDRALAZINE HCL 25 MG PO TABS
25.0000 mg | ORAL_TABLET | Freq: Three times a day (TID) | ORAL | Status: DC
  Administered 2014-12-14 – 2014-12-17 (×8): 25 mg via ORAL
  Filled 2014-12-14 (×15): qty 1

## 2014-12-14 MED ORDER — POLYETHYLENE GLYCOL 3350 17 G PO PACK
17.0000 g | PACK | Freq: Every day | ORAL | Status: DC
  Administered 2014-12-16: 17 g via ORAL
  Filled 2014-12-14 (×3): qty 1

## 2014-12-14 MED ORDER — ATROPINE SULFATE 0.1 MG/ML IJ SOLN
INTRAMUSCULAR | Status: AC
  Filled 2014-12-14: qty 10

## 2014-12-14 MED FILL — Medication: Qty: 1 | Status: AC

## 2014-12-14 NOTE — Evaluation (Signed)
Clinical/Bedside Swallow Evaluation Patient Details  Name: [name] [name] MRN: [id number] Date of Birth: Apr 14, 1961  Today's Date: 12/14/2014 Time: SLP Start Time (ACUTE ONLY): 5400 SLP Stop Time (ACUTE ONLY): 1124 SLP Time Calculation (min) (ACUTE ONLY): 7 min  Past Medical History:  Past Medical History  Diagnosis Date  . Seizures   . Diverticulitis   . GERD (gastroesophageal reflux disease)   . Hiatal hernia   . Wrist tendonitis     R   . Panic disorder 09/07/2014  . OSA on CPAP   . LBBB (left bundle branch block)    Past Surgical History:  Past Surgical History  Procedure Laterality Date  . Tubal ligation    . Hand surgery    . Ep implantable device N/A 12/13/2014    Procedure: Pacemaker Implant;  Surgeon: Deboraha Sprang, MD;  Location: [facility];  Service: Cardiovascular;  Laterality: N/A;   HPI:  Patient is a 54 yo female with a h/o seizures, LBBB, and OSA admitted after witnessed seizure. Per MD note pt noted to have complete heart block. Intubated 7/6-7/7. Pacemaker placement 7/7. No complicating features after dual-chamber pacer placement.Improved aeration but still multi focal atelectasis.   Assessment / Plan / Recommendation Clinical Impression  Increased volume and velocity with straw sips thin resulting in cough and throat clear. Pt aware and reported coughing with straw versus cup. Regular texture and cup sips thin safe without difficulty; SLP will order. Education provided for safe swallow strategies. Pills with liquid. No follow up needed.    Aspiration Risk  Mild    Diet Recommendation Age appropriate regular solids;Thin   Medication Administration: Whole meds with liquid Compensations: Slow rate;Small sips/bites    Other  Recommendations Oral Care Recommendations: Oral care BID   Follow Up Recommendations       Frequency and Duration        Pertinent Vitals/Pain none         Swallow Study Prior Functional Status  Type of Home:  House Available Help at Discharge: Family;Available PRN/intermittently       Oral/Motor/Sensory Function Overall Oral Motor/Sensory Function: Appears within functional limits for tasks assessed   Ice Chips Ice chips: Not tested   Thin Liquid Thin Liquid: Impaired Presentation: Cup;Straw Oral Phase Impairments:  (none) Pharyngeal  Phase Impairments: Throat Clearing - Immediate;Cough - Immediate    Nectar Thick Nectar Thick Liquid: Not tested   Honey Thick Honey Thick Liquid: Not tested   Puree Puree: Not tested   Solid   GO    Solid: Within functional limits       Houston Siren 12/14/2014,11:43 AM  Orbie Pyo Colvin Caroli.Ed [employer] [phone]

## 2014-12-14 NOTE — Progress Notes (Signed)
*  PRELIMINARY RESULTS* Echocardiogram 2D Echocardiogram LIMITED has been performed.  Leavy Cella 12/14/2014, 9:44 AM

## 2014-12-14 NOTE — Progress Notes (Signed)
Patient Name: [name] [name]      SUBJECTIVE: paion better only pleuritic and goes away with spllintyin  Past Medical History  Diagnosis Date  . Seizures   . Diverticulitis   . GERD (gastroesophageal reflux disease)   . Hiatal hernia   . Wrist tendonitis     R   . Panic disorder 09/07/2014  . OSA on CPAP   . LBBB (left bundle branch block)     Scheduled Meds:  Scheduled Meds: . antiseptic oral rinse  7 mL Mouth Rinse QID  . chlorhexidine  15 mL Mouth Rinse BID  . famotidine  20 mg Oral QHS  . lamoTRIgine  200 mg Per NG tube BID  . metoprolol tartrate  25 mg Oral BID  . oxcarbazepine  600 mg Per NG tube BID  . sodium chloride  3 mL Intravenous Q12H   Continuous Infusions: . sodium chloride 50 mL/hr at 12/12/14 2000   acetaminophen, albuterol, fentaNYL (SUBLIMAZE) injection, fentaNYL (SUBLIMAZE) injection, hydrALAZINE, ondansetron (ZOFRAN) IV    PHYSICAL EXAM Filed Vitals:   12/14/14 0500 12/14/14 0513 12/14/14 0600 12/14/14 0700  BP: 180/71 175/77 190/65 173/66  Pulse: 89  98 96  Temp:    97.7 F (36.5 C)  TempSrc:    Oral  Resp: 18  18 21   Height:      Weight: 175 lb 0.7 oz (79.4 kg)     SpO2: 98%  98% 98%   Well developed and nourished in no acute distress HENT normal Neck supple with JVP-flat Clear Regular rate and rhythm, no murmurs or gallops Abd-soft with active BS No Clubbing cyanosis edema Skin-warm and dry A & Oriented  Grossly normal sensory and motor function   TELEMETRY: Reviewed telemetry pt in *nsr    Intake/Output Summary (Last 24 hours) at 12/14/14 0802 Last data filed at 12/14/14 0700  Gross per 24 hour  Intake 1098.46 ml  Output   1500 ml  Net -401.54 ml    LABS: Basic Metabolic Panel:  Recent Labs Lab 12/12/14 0410  12/12/14 0428 12/12/14 1206 12/13/14 0400 12/14/14 0406  NA 135  --  135 137 139 139  K 4.4  --  3.5 4.3 3.5 3.8  CL 103  --  103 108 111 111  CO2  --   --  18* 21* 21* 21*  GLUCOSE 310*   --  334* 96 105* 102*  BUN 11  --  8 8 7 7   CREATININE 0.90  --  1.26* 0.92 0.86 0.80  CALCIUM  --   < > 7.6* 8.2* 8.1* 8.5*  < > = values in this interval not displayed. Cardiac Enzymes:  Recent Labs  12/12/14 0428 12/12/14 1130 12/12/14 1820  TROPONINI <0.03 0.16* 0.08*   CBC:  Recent Labs Lab 12/12/14 0410 12/12/14 0428 12/13/14 0400 12/14/14 0406  WBC  --  13.4* 11.4* 13.6*  NEUTROABS  --  9.4*  --  10.3*  HGB 13.9 13.7 13.2 13.8  HCT 41.0 41.3 39.1 42.2  MCV  --  94.5 92.7 94.0  PLT  --  235 222 176   PROTIME: No results for input(s): LABPROT, INR in the last 72 hours. Liver Function Tests:  Recent Labs  12/12/14 0428 12/14/14 0406  AST 66* 19  ALT 39 25  ALKPHOS 146* 144*  BILITOT 0.7 0.9  PROT 5.4* 6.4*  ALBUMIN 3.0* 3.2*   No results for input(s): LIPASE, AMYLASE in the last 72 hours.  BNP: BNP (last 3 results) No results for input(s): BNP in the last 8760 hours.  ProBNP (last 3 results) No results for input(s): PROBNP in the last 8760 hours.  D-Dimer: No results for input(s): DDIMER in the last 72 hours. Hemoglobin A1C: No results for input(s): HGBA1C in the last 72 hours. Fasting Lipid Panel:  Recent Labs  12/12/14 0907  TRIG 178*   Thyroid Function Tests:  Recent Labs  12/12/14 1207  TSH 1.399   Anemia Panel: No results for input(s): VITAMINB12, FOLATE, FERRITIN, TIBC, IRON, RETICCTPCT in the last 72 hours.   CXR stable lead Device Interrogation device stable   ASSESSMENT AND PLAN:  Active Problems:   Bradycardia   Acute respiratory failure   Complete heart block   Convulsion   Symptomatic bradycardia   Seizures   She has multiple symptoms potentially attributable to meds per family will reach out to neuro to see if we can back off on something>> stop l;amictal Needs mobilization   Will need to arrange [facility] followup (On her own according to them)  Signed, Virl Axe MD  12/14/2014

## 2014-12-14 NOTE — Progress Notes (Signed)
PULMONARY / CRITICAL CARE MEDICINE   Name: [name] [name] MRN: [id number] DOB: December 23, 1960    ADMISSION DATE:  12/12/2014  REFERRING MD :  ED MD  CHIEF COMPLAINT:  Witnessed seizure  INITIAL PRESENTATION: Patient w/ PMH significant for seizures, LBBB, and OSA. Presented to ED after witnessed seizure involving several minutes of rigidity and post-ictal confusion. Was found to be bradycardic to the upper 20s in complete heart block. Atropine provided. Patient intubated. Cardiology was called and transvenous pacer was inserted via femoral access.   STUDIES:  5/17  Lex[place]an >> EF 55-65%  SIGNIFICANT EVENTS: 7/06  Admit after ? Seizure, post-ictal, found to have complete heart block, intubated.  Transvenous pacer placed 7/7- extubated, pacer placed perm  SUBJECTIVE: no distress, some pain  VITAL SIGNS: Temp:  [97.7 F (36.5 C)-101.7 F (38.7 C)] 97.7 F (36.5 C) (07/08 0700) Pulse Rate:  [0-295] 91 (07/08 1107) Resp:  [0-35] 9 (07/08 0736) BP: (120-208)/(46-102) 139/60 mmHg (07/08 0736) SpO2:  [0 %-100 %] 95 % (07/08 1107) Arterial Line BP: (102-177)/(60-94) 158/66 mmHg (07/07 1800) Weight:  [79.4 kg (175 lb 0.7 oz)] 79.4 kg (175 lb 0.7 oz) (07/08 0500)   HEMODYNAMICS:     VENTILATOR SETTINGS:     INTAKE / OUTPUT:  Intake/Output Summary (Last 24 hours) at 12/14/14 1133 Last data filed at 12/14/14 0700  Gross per 24 hour  Intake 945.83 ml  Output   1250 ml  Net -304.17 ml    PHYSICAL EXAMINATION: General: no distress HEENT: jvd wnl Neck:  supple Chest:  Lungs clear to auscultation, left chest pacer clean Cardiac:S1 S2 RRR, Abdomen: soft, nontender. No masses palpable. Bowel sounds present. CNS: sedated, follows commands Extremities: edema nonpit gen, now increassed left arm   LABS:  CBC  Recent Labs Lab 12/12/14 0428 12/13/14 0400 12/14/14 0406  WBC 13.4* 11.4* 13.6*  HGB 13.7 13.2 13.8  HCT 41.3 39.1 42.2  PLT 235 222 176   Coag's No results for  input(s): APTT, INR in the last 168 hours.   BMET  Recent Labs Lab 12/12/14 1206 12/13/14 0400 12/14/14 0406  NA 137 139 139  K 4.3 3.5 3.8  CL 108 111 111  CO2 21* 21* 21*  BUN 8 7 7   CREATININE 0.92 0.86 0.80  GLUCOSE 96 105* 102*   Electrolytes  Recent Labs Lab 12/12/14 1206 12/13/14 0400 12/14/14 0406  CALCIUM 8.2* 8.1* 8.5*   Sepsis Markers  Recent Labs Lab 12/12/14 0355 12/12/14 0430 12/12/14 1209  LATICACIDVEN 0.86 4.46* 1.3   ABG  Recent Labs Lab 12/12/14 0445 12/12/14 1124  PHART 7.180* 7.416  PCO2ART 48.7* 31.2*  PO2ART 65.0* 128.0*   Liver Enzymes  Recent Labs Lab 12/12/14 0428 12/14/14 0406  AST 66* 19  ALT 39 25  ALKPHOS 146* 144*  BILITOT 0.7 0.9  ALBUMIN 3.0* 3.2*   Cardiac Enzymes  Recent Labs Lab 12/12/14 0428 12/12/14 1130 12/12/14 1820  TROPONINI <0.03 0.16* 0.08*   Glucose  Recent Labs Lab 12/13/14 0831 12/13/14 1156 12/13/14 1952 12/14/14 0119 12/14/14 0432 12/14/14 0736  GLUCAP 113* 125* 95 96 107* 96    Imaging Dg Chest Port 1 View  12/14/2014   CLINICAL DATA:  Cardiac device in place  EXAM: PORTABLE CHEST - 1 VIEW  COMPARISON:  Chest x-ray from yesterday  FINDINGS: Interval removal of temporary pacer with placement of a dual-chamber pacer from the left. Leads overlap the expected location of the right ventricle and right atrial appendage.  Improving  lung aeration with decreased atelectasis. Volume loss remains greater on the left. No edema, effusion, or pneumothorax. Normal heart size and mediastinal contours.  IMPRESSION: 1. No complicating features after dual-chamber pacer placement. 2. Improved aeration but still multi focal atelectasis.   Electronically Signed   By: Monte Fantasia M.D.   On: 12/14/2014 06:21     ASSESSMENT / PLAN:  PULMONARY OETT 7/6 >> A:  Acute Respiratory Failure - in setting of altered mental status, CHB Hx OSA Tobacco Abuse  pulm edema P:   IS Neg balance has improved  pcxr  CARDIOVASCULAR R Femoral Venous Sheath 7/6 >> 7/7 Per pacer 7/7 A:  Complete heart block - s/p placement of transvenous pacer Cardiogenic vs obstructive shock - lactic acid 4.46, shock resolved Syncope  LBBB, Hx; no recent chest pain Recent Cardiolite: EF 55-65% Echo 7/6- ef wnl HTN P: tele hydral oral start Should we dc BB, per cards  RENAL A:   Metabolic acidosis, 2/2 lactic acidosis (likely some baseline acidosis w/ OSA) Acute Kidney Injury - in setting of hypoperfusion P:   kvo  Avoid saline, with slight Cl rise  GASTROINTESTINAL A:   Abdominal pain; post-ictal complaint prior to ED presentation Diverticulitis, Hx P:   Need a BM Diet Add mriilax  HEMATOLOGIC A:   Leukocytosis P:  PCXR in future for rt base  INFECTIOUS A:   Possible aspiration 2/2 seizure activity P:   Monitor off abx for now   ENDOCRINE A:   Hyperglycemia - CBG 343 on presentation, resolved.  No Hx of DM.  P:   Monitor glucose on BMP  Assess TSH - 1.3  NEUROLOGIC A:   Epilepsy - hx of, followed by [name] as outpatient.  Concerned this may have been cardiac related as seizures have never been documented on EEG  Possible head trauma; can not r/o at this time P:   Up about per EP Ambulated pt   FAMILY  - Updates: Brother updated at bedside.    To [facility], tele  Lavon Paganini. Titus Mould, MD, [place] Pgr: [facility]

## 2014-12-14 NOTE — Evaluation (Signed)
Physical Therapy Evaluation Patient Details Name: [name] [name] MRN: [id number] DOB: January 04, 1961 Today's Date: 12/14/2014   History of Present Illness  Patient w/ PMH significant for seizures, LBBB, and OSA. Presented to ED after witnessed seizure involving several minutes of rigidity and post-ictal confusion. Was found to be bradycardic to the upper 20s in complete heart block. Pt with permanent pacemaker 7/7  Clinical Impression  Pt very motivated to get up and move around and return to independent function. Pt is LHD and having difficulty maintaining limited LUE movement because of it. Pt with RUE pain from edema and abdominal pain from seizures limiting her tolerance for mobility. She will benefit from acute therapy to maximize mobility, transfers and gait to decrease burden of care.     Follow Up Recommendations Home health PT    Equipment Recommendations  None recommended by PT    Recommendations for Other Services       Precautions / Restrictions Precautions Precautions: ICD/Pacemaker      Mobility  Bed Mobility Overal bed mobility: Needs Assistance Bed Mobility: Rolling;Sidelying to Sit Rolling: Min guard Sidelying to sit: Min assist       General bed mobility comments: cues for sequence and min assist to elevate trunk from surface  Transfers Overall transfer level: Modified independent                  Ambulation/Gait Ambulation/Gait assistance: Supervision Ambulation Distance (Feet): 400 Feet Assistive device: Rolling walker (2 wheeled) Gait Pattern/deviations: Step-through pattern;Decreased stride length   Gait velocity interpretation: at or above normal speed for age/gender General Gait Details: cues for position in RW and pacemaker restriction  Stairs            Wheelchair Mobility    Modified Rankin (Stroke Patients Only)       Balance                                             Pertinent Vitals/Pain Pain  Assessment: 0-10 Pain Score: 5  Pain Location: RUE pain Pain Descriptors / Indicators: Aching Pain Intervention(s): Repositioned;Limited activity within patient's tolerance  HR 91 sats 95% on RA BP 150/62    Home Living Family/patient expects to be discharged to:: Private residence Living Arrangements: Other relatives Available Help at Discharge: Family;Available PRN/intermittently Type of Home: House Home Access: Stairs to enter   Entrance Stairs-Number of Steps: 4 Home Layout: One level Home Equipment: Walker - 2 wheels;Cane - single point Additional Comments: lives with sister who works    Prior Function Level of Independence: Independent               Journalist, newspaper        Extremity/Trunk Assessment   Upper Extremity Assessment:  (limited by precautions and pain)           Lower Extremity Assessment: Overall WFL for tasks assessed      Cervical / Trunk Assessment: Normal  Communication   Communication: No difficulties  Cognition Arousal/Alertness: Awake/alert Behavior During Therapy: WFL for tasks assessed/performed Overall Cognitive Status: Within Functional Limits for tasks assessed                      General Comments      Exercises        Assessment/Plan    PT Assessment Patient needs continued PT services  PT  Diagnosis Difficulty walking;Acute pain   PT Problem List Decreased range of motion;Decreased strength;Decreased mobility;Decreased knowledge of use of DME  PT Treatment Interventions Gait training;Stair training;Functional mobility training;Therapeutic activities;Patient/family education   PT Goals (Current goals can be found in the Care Plan section) Acute Rehab PT Goals Patient Stated Goal: return home PT Goal Formulation: With patient Time For Goal Achievement: 12/21/14 Potential to Achieve Goals: Good    Frequency Min 3X/week   Barriers to discharge Decreased caregiver support      Co-evaluation                End of Session   Activity Tolerance: Patient tolerated treatment well Patient left: in chair;with call bell/phone within reach Nurse Communication: Mobility status         Time: 1034-1101 PT Time Calculation (min) (ACUTE ONLY): 27 min   Charges:   PT Evaluation $Initial PT Evaluation Tier I: 1 Procedure PT Treatments $Therapeutic Activity: 8-22 mins   PT G CodesMelford Aase 12/14/2014, 11:14 AM Elwyn Reach, [place]

## 2014-12-14 NOTE — Progress Notes (Signed)
Pt continues to have hypertension 180's SBP. PRN medication does not seem to be helping with lowering BP. Cardiology Fellow notified and orders placed to give 10mg  of labetalol.  After giving labetalol pt BP improved SBP 150. Venous sheath was removed at 0021am. Pressure held for 20 mins. V/S remaine stable. Will continue to monitor.

## 2014-12-14 NOTE — Progress Notes (Signed)
RN removed foley cath at 0530 am. Will continue to monitor pt.

## 2014-12-14 NOTE — Progress Notes (Signed)
Preliminary results by tech - Left Upper Ext. Venous Duplex Completed. No evidence of deep or superficial vein thrombosis in the left upper extremity. Dezyre Hoefer, BS, RDMS, RVT  .

## 2014-12-14 NOTE — Progress Notes (Signed)
Patient voided 400 cc this a.m. This is first void post foley removal.   [name] [name], Vilinda Boehringer, RN

## 2014-12-14 NOTE — Evaluation (Signed)
Occupational Therapy Evaluation Patient Details Name: [name] [name] MRN: [id number] DOB: 1961/02/10 Today's Date: 12/14/2014    History of Present Illness Patient w/ PMH significant for seizures, LBBB, and OSA. Presented to ED after witnessed seizure involving several minutes of rigidity and post-ictal confusion. Was found to be bradycardic to the upper 20s in complete heart block. Pt with permanent pacemaker 7/7   Clinical Impression   Pt was independent prior to admission.  Presents with abdominal muscle pain from seizure and difficulty avoiding use of using L UE due to pacemaker precautions as she is L hand dominant.  Instructed pt in compensatory strategies for bathing, dressing and bed mobility.  Pt will have assist of her sister and a friend upon discharge.  No further OT needs.    Follow Up Recommendations  No OT follow up    Equipment Recommendations  None recommended by OT    Recommendations for Other Services       Precautions / Restrictions Precautions Precautions: ICD/Pacemaker      Mobility Bed Mobility   Bed Mobility: Rolling;Sidelying to Sit Rolling: Supervision Sidelying to sit: Supervision       General bed mobility comments: cues for sequence, no physical assist getting up from R side  Transfers Overall transfer level: Modified independent                    Balance                                            ADL Overall ADL's : Needs assistance/impaired Eating/Feeding: Modified independent;Sitting Eating/Feeding Details (indicate cue type and reason): using R hand predominantly, L for stabilization Grooming: Wash/dry hands;Supervision/safety;Standing   Upper Body Bathing: Minimal assitance;Sitting   Lower Body Bathing: Minimal assistance;Sit to/from stand   Upper Body Dressing : Minimal assistance;Sitting   Lower Body Dressing: Minimal assistance;Sit to/from stand   Toilet Transfer:  Supervision/safety;Ambulation   Toileting- Clothing Manipulation and Hygiene: Supervision/safety;Sit to/from stand       Functional mobility during ADLs: Supervision/safety General ADL Comments: Instructed in hemi techniques for bathing and dressing.     Vision     Perception     Praxis      Pertinent Vitals/Pain Pain Assessment: Faces Faces Pain Scale: Hurts little more Pain Location: abdominal Pain Descriptors / Indicators: Sore (from seizures)     Hand Dominance Left   Extremity/Trunk Assessment Upper Extremity Assessment Upper Extremity Assessment:  (L UE with pacemaker precautions, in sling)   Lower Extremity Assessment Lower Extremity Assessment: Overall WFL for tasks assessed   Cervical / Trunk Assessment Cervical / Trunk Assessment: Normal   Communication Communication Communication: No difficulties   Cognition Arousal/Alertness: Awake/alert Behavior During Therapy: WFL for tasks assessed/performed Overall Cognitive Status: Within Functional Limits for tasks assessed                     General Comments       Exercises       Shoulder Instructions      Home Living Family/patient expects to be discharged to:: Private residence Living Arrangements: Other relatives Available Help at Discharge: Family;Friend(s);Available 24 hours/day Type of Home: House Home Access: Stairs to enter [employer] of Steps: 4   Home Layout: One level     Bathroom Shower/Tub: Teacher, early years/pre: Standard     Home  Equipment: Gilford Rile - 2 wheels;Cane - single point   Additional Comments: lives with sister who works, pt awaiting disability due to seizures      Prior Functioning/Environment Level of Independence: Independent             OT Diagnosis:     OT Problem List:     OT Treatment/Interventions:      OT Goals(Current goals can be found in the care plan section) Acute Rehab OT Goals Patient Stated Goal: return home   OT Frequency:     Barriers to D/C:            Co-evaluation              End of Session    Activity Tolerance: Patient tolerated treatment well Patient left: in bed;with family/visitor present (EOB)   Time: 1440-1501 OT Time Calculation (min): 21 min Charges:  OT General Charges $OT Visit: 1 Procedure OT Evaluation $Initial OT Evaluation Tier I: 1 Procedure G-Codes:    Malka So 12/14/2014, 3:26 PM  [phone]

## 2014-12-15 DIAGNOSIS — R570 Cardiogenic shock: Secondary | ICD-10-CM

## 2014-12-15 DIAGNOSIS — R001 Bradycardia, unspecified: Secondary | ICD-10-CM

## 2014-12-15 DIAGNOSIS — N179 Acute kidney failure, unspecified: Secondary | ICD-10-CM

## 2014-12-15 DIAGNOSIS — J9601 Acute respiratory failure with hypoxia: Secondary | ICD-10-CM | POA: Diagnosis present

## 2014-12-15 DIAGNOSIS — Z959 Presence of cardiac and vascular implant and graft, unspecified: Secondary | ICD-10-CM

## 2014-12-15 LAB — TRIGLYCERIDES: Triglycerides: 166 mg/dL — ABNORMAL HIGH

## 2014-12-15 LAB — GLUCOSE, CAPILLARY: Glucose-Capillary: 120 mg/dL — ABNORMAL HIGH (ref 65–99)

## 2014-12-15 NOTE — Progress Notes (Signed)
[facility] PROGRESS NOTE  Assessment/Plan: Acute respiratory failure with hypoxia - intubated on admission due to decrease level of consciousness extubated on 7.7.2016. - keep pt negative. - pt recommended outpatient PT.  Cardiogenic shock/Symptomatic Bradycardia due to intermitted Complete heart block s/p Cardiac device in situ: - initially did not respond to atropine started on dopamine - Temporary PM via femoral access placed on admission. S/p permanent pacemaker on 7.7.2016. - Echo showed no wall motion EF 55% - awaiting EP recommendations.  Seizures - EEG no eliptiform wave cont home meds.  Metabolic acidosis: - due to hyper fusion in the setting of shock and bradycardia  AKI: - pre-renal in etiology - resolved with IV hydration.  Leukocytosis: - resolved, likely due to stress de margination.  Hyperglycemia: - in the setting of acute stress now resolved.  Code Status: full Family Communication: none  Disposition Plan: inpatient   Consultants:  PCCM  Cardiology  EP  Procedures: 7/06 Admit after ? Seizure, post-ictal, found to have complete heart block, intubated. Transvenous pacer placed 7/7- extubated, pacer placed perm  Antibiotics:  none  HPI/Subjective: No complains  Objective: Filed Vitals:   12/14/14 1107 12/14/14 1405 12/14/14 2048 12/15/14 0405  BP:  155/65 156/76 176/77  Pulse: 91 95 103 105  Temp:  99.2 F (37.3 C) 99.1 F (37.3 C) 98.6 F (37 C)  TempSrc:  Axillary Oral Oral  Resp:  18 18 16   Height:      Weight:    77.973 kg (171 lb 14.4 oz)  SpO2: 95% 97% 94% 95%    Intake/Output Summary (Last 24 hours) at 12/15/14 0906 Last data filed at 12/15/14 0810  Gross per 24 hour  Intake    720 ml  Output      0 ml  Net    720 ml   Filed Weights   12/13/14 0500 12/14/14 0500 12/15/14 0405  Weight: 82.1 kg (181 lb) 79.4 kg (175 lb 0.7 oz) 77.973 kg (171 lb 14.4 oz)    Exam:  General: Alert, awake, oriented x3,  in no acute distress.  HEENT: No bruits, no goiter.  Heart: Regular rate and rhythm. Lungs: Good air movement, clear Abdomen: Soft, nontender, nondistended, positive bowel sounds.  Neuro: Grossly intact, nonfocal.   Data Reviewed: Basic Metabolic Panel:  Recent Labs Lab 12/12/14 0410 12/12/14 0428 12/12/14 1206 12/13/14 0400 12/14/14 0406  NA 135 135 137 139 139  K 4.4 3.5 4.3 3.5 3.8  CL 103 103 108 111 111  CO2  --  18* 21* 21* 21*  GLUCOSE 310* 334* 96 105* 102*  BUN 11 8 8 7 7   CREATININE 0.90 1.26* 0.92 0.86 0.80  CALCIUM  --  7.6* 8.2* 8.1* 8.5*   Liver Function Tests:  Recent Labs Lab 12/12/14 0428 12/14/14 0406  AST 66* 19  ALT 39 25  ALKPHOS 146* 144*  BILITOT 0.7 0.9  PROT 5.4* 6.4*  ALBUMIN 3.0* 3.2*   No results for input(s): LIPASE, AMYLASE in the last 168 hours. No results for input(s): AMMONIA in the last 168 hours. CBC:  Recent Labs Lab 12/12/14 0410 12/12/14 0428 12/13/14 0400 12/14/14 0406  WBC  --  13.4* 11.4* 13.6*  NEUTROABS  --  9.4*  --  10.3*  HGB 13.9 13.7 13.2 13.8  HCT 41.0 41.3 39.1 42.2  MCV  --  94.5 92.7 94.0  PLT  --  235 222 176   Cardiac Enzymes:  Recent Labs Lab 12/12/14 0428 12/12/14 1130  12/12/14 1820  TROPONINI <0.03 0.16* 0.08*   BNP (last 3 results) No results for input(s): BNP in the last 8760 hours.  ProBNP (last 3 results) No results for input(s): PROBNP in the last 8760 hours.  CBG:  Recent Labs Lab 12/13/14 1952 12/14/14 0119 12/14/14 0432 12/14/14 0736 12/14/14 1555  GLUCAP 95 96 107* 96 111*    Recent Results (from the past 240 hour(s))  MRSA PCR Screening     Status: None   Collection Time: 12/12/14  8:00 AM  Result Value Ref Range Status   MRSA by PCR NEGATIVE NEGATIVE Final    Comment:        The GeneXpert MRSA Assay (FDA approved for NASAL specimens only), is one component of a comprehensive MRSA colonization surveillance program. It is not intended to diagnose  MRSA infection nor to guide or monitor treatment for MRSA infections.      Studies: Dg Chest Port 1 View  12/14/2014   CLINICAL DATA:  Cardiac device in place  EXAM: PORTABLE CHEST - 1 VIEW  COMPARISON:  Chest x-ray from yesterday  FINDINGS: Interval removal of temporary pacer with placement of a dual-chamber pacer from the left. Leads overlap the expected location of the right ventricle and right atrial appendage.  Improving lung aeration with decreased atelectasis. Volume loss remains greater on the left. No edema, effusion, or pneumothorax. Normal heart size and mediastinal contours.  IMPRESSION: 1. No complicating features after dual-chamber pacer placement. 2. Improved aeration but still multi focal atelectasis.   Electronically Signed   By: Monte Fantasia M.D.   On: 12/14/2014 06:21    Scheduled Meds: . famotidine  20 mg Oral QHS  . hydrALAZINE  25 mg Oral 3 times per day  . metoprolol tartrate  25 mg Oral BID  . oxcarbazepine  600 mg Per NG tube BID  . polyethylene glycol  17 g Oral Daily  . sodium chloride  3 mL Intravenous Q12H   Continuous Infusions: . sodium chloride Stopped (12/14/14 0800)    Time Spent: 25 min   Charlynne Cousins  [facility] Pager [phone]. If 7PM-7AM, please contact night-coverage at [email or web address], password [facility] 12/15/2014, 9:06 AM  LOS: 3 days

## 2014-12-16 DIAGNOSIS — R231 Pallor: Secondary | ICD-10-CM | POA: Insufficient documentation

## 2014-12-16 LAB — BASIC METABOLIC PANEL
ANION GAP: 8 (ref 5–15)
BUN: 7 mg/dL (ref 6–20)
CHLORIDE: 110 mmol/L (ref 101–111)
CO2: 21 mmol/L — AB (ref 22–32)
CREATININE: 0.8 mg/dL (ref 0.44–1.00)
Calcium: 9.1 mg/dL (ref 8.9–10.3)
GFR calc Af Amer: >60 mL/min (ref >60)
GFR calc non Af Amer: >60 mL/min (ref >60)
GLUCOSE: 121 mg/dL — AB (ref 65–99)
Potassium: 3.3 mmol/L — ABNORMAL LOW (ref 3.5–5.1)
Sodium: 139 mmol/L (ref 135–145)

## 2014-12-16 LAB — GLUCOSE, CAPILLARY: Glucose-Capillary: 99 mg/dL (ref 65–99)

## 2014-12-16 NOTE — Progress Notes (Signed)
Patient ID: SANNA PORCARO, female   DOB: 1960/06/10, 54 y.o.   MRN: [id number]    Patient Name: [name] [name] Date of Encounter: 12/16/2014     Principal Problem:   Acute respiratory failure with hypoxia Active Problems:   Bradycardia   Complete heart block   Symptomatic bradycardia   Seizures   Cardiac device in situ   AKI (acute kidney injury)   Cardiogenic shock    SUBJECTIVE  C/o left hand being sore, discolored  CURRENT MEDS . famotidine  20 mg Oral QHS  . hydrALAZINE  25 mg Oral 3 times per day  . metoprolol tartrate  25 mg Oral BID  . oxcarbazepine  600 mg Per NG tube BID  . polyethylene glycol  17 g Oral Daily  . sodium chloride  3 mL Intravenous Q12H    OBJECTIVE  Filed Vitals:   12/15/14 0405 12/15/14 1406 12/15/14 1938 12/16/14 0359  BP: 176/77 145/76 157/74 155/73  Pulse: 105 94 99 97  Temp: 98.6 F (37 C) 98.7 F (37.1 C) 98.6 F (37 C) 99.2 F (37.3 C)  TempSrc: Oral Oral Oral Oral  Resp: 16 17 18 18   Height:      Weight: 171 lb 14.4 oz (77.973 kg)   169 lb 1.5 oz (76.7 kg)  SpO2: 95% 94% 96% 96%    Intake/Output Summary (Last 24 hours) at 12/16/14 0838 Last data filed at 12/16/14 0753  Gross per 24 hour  Intake    360 ml  Output      0 ml  Net    360 ml   Filed Weights   12/14/14 0500 12/15/14 0405 12/16/14 0359  Weight: 175 lb 0.7 oz (79.4 kg) 171 lb 14.4 oz (77.973 kg) 169 lb 1.5 oz (76.7 kg)    PHYSICAL EXAM  General: Pleasant, middle aged woman, NAD. Neuro: Alert and oriented X 3. Moves all extremities spontaneously. Psych: Normal affect. HEENT:  Normal  Neck: Supple without bruits or JVD. Lungs:  Resp regular and unlabored, CTA. Heart: RRR no s3, s4, or murmurs. Abdomen: Soft, non-tender, non-distended, BS + x 4.  Extremities: normal except left had and wrist with livedo reticularis  Accessory Clinical Findings  CBC  Recent Labs  12/14/14 0406  WBC 13.6*  NEUTROABS 10.3*  HGB 13.8  HCT 42.2  MCV 94.0  PLT  299   Basic Metabolic Panel  Recent Labs  12/14/14 0406  NA 139  K 3.8  CL 111  CO2 21*  GLUCOSE 102*  BUN 7  CREATININE 0.80  CALCIUM 8.5*   Liver Function Tests  Recent Labs  12/14/14 0406  AST 19  ALT 25  ALKPHOS 144*  BILITOT 0.9  PROT 6.4*  ALBUMIN 3.2*   No results for input(s): LIPASE, AMYLASE in the last 72 hours. Cardiac Enzymes No results for input(s): CKTOTAL, CKMB, CKMBINDEX, TROPONINI in the last 72 hours. BNP Invalid input(s): POCBNP D-Dimer No results for input(s): DDIMER in the last 72 hours. Hemoglobin A1C No results for input(s): HGBA1C in the last 72 hours. Fasting Lipid Panel  Recent Labs  12/15/14 0824  TRIG 166*   Thyroid Function Tests No results for input(s): TSH, T4TOTAL, T3FREE, THYROIDAB in the last 72 hours.  Invalid input(s): FREET3  TELE  nsr with ventricular pacing and intrinsic conduction  Radiology/Studies  Dg Chest Port 1 View  12/14/2014   CLINICAL DATA:  Cardiac device in place  EXAM: PORTABLE CHEST - 1 VIEW  COMPARISON:  Chest x-ray  from yesterday  FINDINGS: Interval removal of temporary pacer with placement of a dual-chamber pacer from the left. Leads overlap the expected location of the right ventricle and right atrial appendage.  Improving lung aeration with decreased atelectasis. Volume loss remains greater on the left. No edema, effusion, or pneumothorax. Normal heart size and mediastinal contours.  IMPRESSION: 1. No complicating features after dual-chamber pacer placement. 2. Improved aeration but still multi focal atelectasis.   Electronically Signed   By: Monte Fantasia M.D.   On: 12/14/2014 06:21   Dg Chest Port 1 View  12/13/2014   CLINICAL DATA:  Follow-up of respiratory failure  EXAM: PORTABLE CHEST - 1 VIEW  COMPARISON:  Portable chest x-ray of December 12, 2014  FINDINGS: The lungs are adequately inflated. Confluent alveolar opacity is more conspicuous in the right infrahilar region. There is no pleural effusion  or pneumothorax. The heart and pulmonary vascularity are normal. The endotracheal tube tip lies 3.7 cm above the carina. The esophagogastric tube tip projects below the inferior margin of the study. External pacemaker defibrillator pads are present and stable. A temporary pacemaker lead is present in the right ventricle and appears stable.  IMPRESSION: Slight increase conspicuity of right infrahilar alveolar opacity is consistent with pneumonia. There is no CHF. The support tubes are in reasonable position.   Electronically Signed   By: David  Martinique M.D.   On: 12/13/2014 07:35   Dg Chest Portable 1 View  12/12/2014   CLINICAL DATA:  Endotracheal tube placement.  EXAM: PORTABLE CHEST - 1 VIEW  COMPARISON:  None.  FINDINGS: Endotracheal tube with tip between the clavicular heads and carina.  There is a temporary pacer lead from below which is likely at the right ventricular outflow tract. Heart size is normal. Streaky opacities in the lower lungs compatible with atelectasis. No pulmonary edema, effusion, or pneumothorax.  IMPRESSION: 1. Unremarkable positioning of endotracheal tube and temporary pacer. 2. Low lung volumes with atelectasis. 3. No cardiomegaly or pulmonary edema.   Electronically Signed   By: Monte Fantasia M.D.   On: 12/12/2014 06:15   Dg Abd Portable 1v  12/12/2014   CLINICAL DATA:  OG tube placement.  EXAM: PORTABLE ABDOMEN - 1 VIEW  COMPARISON:  None.  FINDINGS: OG tube noted with its tip projected over the distal stomach. No gastric distention. Right femoral venous catheter noted with tip projected over the pulmonary outflow tract. Soft tissue structures are unremarkable. No bowel distention or free air. Pelvic calcifications consistent phleboliths. Degenerative change lumbar spine and both hips.  IMPRESSION: OG tube noted with tip projected over the stomach. No gastric or bowel distention .   Electronically Signed   By: Marcello Moores  Register   On: 12/12/2014 11:15   Dg C-arm 1-60 Min-no  Report  12/12/2014   CLINICAL DATA: pacemaker placement   C-ARM 1-60 MINUTES  Fluoroscopy was utilized by the requesting physician.  No radiographic  interpretation.     ASSESSMENT AND PLAN  1. Complete heart block 2. S/p PPM insertion 3. Livedo reticularis  Discussion - it appears that the patient has developed livedo reticularis after an arterial puncture of the left radial artery. She did not have a heart cath but I suspect radial artery puncture, either from art line or ABG is the cause. Consider asking vascular surgery to see for confirmation but this is rarely a problem that requires a surgical intervention. Elevation and warming of the hand and pain control. Hopefully she will improve with time but may  not. I do not think this is a complication of her venous insertion of her PPM.  Hayley Horn,M.D.  12/16/2014 8:38 AM

## 2014-12-16 NOTE — Progress Notes (Signed)
[facility] PROGRESS NOTE  Assessment/Plan: Acute respiratory failure with hypoxia - Intubated on admission due to decrease level of consciousness extubated on 7.7.2016. - [name] [name] recommended outpatient [name] [name]. - resolved.  Cardiogenic shock/Symptomatic Bradycardia due to intermitted Complete heart block s/p Cardiac device in situ: - Initially did not respond to atropine started on dopamine - Temporary PM via femoral access placed on admission. S/p permanent pacemaker on 7.7.2016. - Echo showed no wall motion EF 55% - Awaiting EP recommendations, ? When to d/c.  Seizures - EEG no eliptiform wave. - cont home meds. Follow up with neuro as an outpatient.  Metabolic acidosis: - due to hypo fusion in the setting of shock and bradycardia  AKI: - pre-renal in etiology - resolved with IV hydration.  Leukocytosis: - resolved, likely due to stress de margination.  Hyperglycemia: - in the setting of acute stress now resolved.  Livedo Reticularis: - Will d/w EP, slightly painful to palpation. - upper ext doppler negative for DVT  Code Status: full Family Communication: none  Disposition Plan: inpatient   Consultants:  PCCM  Cardiology  EP  Procedures: 7/06 Admit after ? Seizure, post-ictal, found to have complete heart block, intubated. Transvenous pacer placed 7/7- extubated, pacer placed perm  Antibiotics:  none  HPI/Subjective: Left arm pain  Objective: Filed Vitals:   12/15/14 0405 12/15/14 1406 12/15/14 1938 12/16/14 0359  BP: 176/77 145/76 157/74 155/73  Pulse: 105 94 99 97  Temp: 98.6 F (37 C) 98.7 F (37.1 C) 98.6 F (37 C) 99.2 F (37.3 C)  TempSrc: Oral Oral Oral Oral  Resp: 16 17 18 18   Height:      Weight: 77.973 kg (171 lb 14.4 oz)   76.7 kg (169 lb 1.5 oz)  SpO2: 95% 94% 96% 96%    Intake/Output Summary (Last 24 hours) at 12/16/14 0719 Last data filed at 12/15/14 1407  Gross per 24 hour  Intake    480 ml  Output      0 ml  Net     480 ml   Filed Weights   12/14/14 0500 12/15/14 0405 12/16/14 0359  Weight: 79.4 kg (175 lb 0.7 oz) 77.973 kg (171 lb 14.4 oz) 76.7 kg (169 lb 1.5 oz)    Exam:  General: Alert, awake, oriented x3, in no acute distress.  HEENT: No bruits, no goiter.  Heart: Regular rate and rhythm. Lungs: Good air movement, clear Abdomen: Soft, nontender, nondistended, positive bowel sounds.  Neuro: Grossly intact, nonfocal.   Data Reviewed: Basic Metabolic Panel:  Recent Labs Lab 12/12/14 0410 12/12/14 0428 12/12/14 1206 12/13/14 0400 12/14/14 0406  NA 135 135 137 139 139  K 4.4 3.5 4.3 3.5 3.8  CL 103 103 108 111 111  CO2  --  18* 21* 21* 21*  GLUCOSE 310* 334* 96 105* 102*  BUN 11 8 8 7 7   CREATININE 0.90 1.26* 0.92 0.86 0.80  CALCIUM  --  7.6* 8.2* 8.1* 8.5*   Liver Function Tests:  Recent Labs Lab 12/12/14 0428 12/14/14 0406  AST 66* 19  ALT 39 25  ALKPHOS 146* 144*  BILITOT 0.7 0.9  PROT 5.4* 6.4*  ALBUMIN 3.0* 3.2*   No results for input(s): LIPASE, AMYLASE in the last 168 hours. No results for input(s): AMMONIA in the last 168 hours. CBC:  Recent Labs Lab 12/12/14 0410 12/12/14 0428 12/13/14 0400 12/14/14 0406  WBC  --  13.4* 11.4* 13.6*  NEUTROABS  --  9.4*  --  10.3*  HGB 13.9 13.7 13.2 13.8  HCT 41.0 41.3 39.1 42.2  MCV  --  94.5 92.7 94.0  PLT  --  235 222 176   Cardiac Enzymes:  Recent Labs Lab 12/12/14 0428 12/12/14 1130 12/12/14 1820  TROPONINI <0.03 0.16* 0.08*   BNP (last 3 results) No results for input(s): BNP in the last 8760 hours.  ProBNP (last 3 results) No results for input(s): PROBNP in the last 8760 hours.  CBG:  Recent Labs Lab 12/14/14 0432 12/14/14 0736 12/14/14 1555 12/15/14 2111 12/16/14 0624  GLUCAP 107* 96 111* 120* 99    Recent Results (from the past 240 hour(s))  MRSA PCR Screening     Status: None   Collection Time: 12/12/14  8:00 AM  Result Value Ref Range Status   MRSA by PCR NEGATIVE NEGATIVE Final     Comment:        The GeneXpert MRSA Assay (FDA approved for NASAL specimens only), is one component of a comprehensive MRSA colonization surveillance program. It is not intended to diagnose MRSA infection nor to guide or monitor treatment for MRSA infections.      Studies: No results found.  Scheduled Meds: . famotidine  20 mg Oral QHS  . hydrALAZINE  25 mg Oral 3 times per day  . metoprolol tartrate  25 mg Oral BID  . oxcarbazepine  600 mg Per NG tube BID  . polyethylene glycol  17 g Oral Daily  . sodium chloride  3 mL Intravenous Q12H   Continuous Infusions:    Time Spent: 25 min   Charlynne Cousins  [facility] Pager [phone]. If 7PM-7AM, please contact night-coverage at [email or web address], password [facility] 12/16/2014, 7:19 AM  LOS: 4 days

## 2014-12-17 DIAGNOSIS — R238 Other skin changes: Secondary | ICD-10-CM

## 2014-12-17 DIAGNOSIS — R569 Unspecified convulsions: Secondary | ICD-10-CM

## 2014-12-17 LAB — BASIC METABOLIC PANEL
ANION GAP: 12 (ref 5–15)
BUN: 9 mg/dL (ref 6–20)
CO2: 21 mmol/L — ABNORMAL LOW (ref 22–32)
CREATININE: 0.79 mg/dL (ref 0.44–1.00)
Calcium: 9 mg/dL (ref 8.9–10.3)
Chloride: 108 mmol/L (ref 101–111)
GFR calc non Af Amer: >60 mL/min (ref >60)
Glucose, Bld: 110 mg/dL — ABNORMAL HIGH (ref 65–99)
Potassium: 3.7 mmol/L (ref 3.5–5.1)
Sodium: 141 mmol/L (ref 135–145)

## 2014-12-17 MED ORDER — METOPROLOL TARTRATE 25 MG PO TABS: 25.0000 mg | ORAL_TABLET | Freq: Two times a day (BID) | ORAL | Status: DC

## 2014-12-17 MED FILL — Sodium Chloride Irrigation Soln 0.9%: Qty: 500 | Status: AC

## 2014-12-17 MED FILL — Gentamicin Sulfate Inj 40 MG/ML: INTRAMUSCULAR | Qty: 2 | Status: AC

## 2014-12-17 NOTE — Discharge Summary (Signed)
Physician Discharge Summary  [name] [name] VVO:[id number] DOB: 1960/12/24 DOA: 12/12/2014  PCP: Milagros Evener, MD  Admit date: 12/12/2014 Discharge date: 12/17/2014  Time spent: 35 minutes  Recommendations for Outpatient Follow-up:  1. Follow up with Cardiology in 2-4 weeks.  Discharge Diagnoses:  Principal Problem:   Acute respiratory failure with hypoxia Active Problems:   Bradycardia   Complete heart block   Symptomatic bradycardia   Seizures   Cardiac device in situ   AKI (acute kidney injury)   Cardiogenic shock   Livedo reticularis   Discharge Condition: stable  Diet recommendation: heart healthy  Filed Weights   12/15/14 0405 12/16/14 0359 12/17/14 0617  Weight: 77.973 kg (171 lb 14.4 oz) 76.7 kg (169 lb 1.5 oz) 76.3 kg (168 lb 3.4 oz)    History of present illness:  Patient w/ PMH significant for seizures, LBBB, and OSA. Presented to ED after witnessed seizure involving several minutes of rigidity and post-ictal confusion. Was found to be bradycardic to the upper 20s in complete heart block. Atropine provided. Patient intubated. Cardiology was called and transvenous pacer was inserted via femoral access.   Hospital Course:  Acute respiratory failure with hypoxia - Intubated on admission due to decrease level of consciousness extubated on 7.7.2016. - pt recommended outpatient PT. - resolved.  Cardiogenic shock/Symptomatic Bradycardia due to intermitted Complete heart block s/p Cardiac device in situ: - Initially did not respond to atropine started on dopamine - Temporary PM via femoral access placed on admission. S/p permanent pacemaker on 7.7.2016. - Echo showed no wall motion EF 55% - follow up with EP as an outpatient.  Seizures: - neurology consult recommended EEG. - EEG no eliptiform wave. - cont home meds. Follow up with neuro as an outpatient.  Metabolic acidosis: - due to hypo fusion in the setting of shock and bradycardia  AKI: - pre-renal  in etiology - resolved with IV hydration.  Leukocytosis: - resolved, likely due to stress de margination.  Hyperglycemia: - in the setting of acute stress now resolved.  Livedo Reticularis: - Will d/w EP, slightly painful to palpation. - upper ext doppler negative for DVT   Procedures:  Pacermaker 7.7.2016  Consultations:  Cardiology  PCCM   Discharge Exam: Filed Vitals:   12/17/14 0617  BP: 169/81  Pulse: 97  Temp: 98.5 F (36.9 C)  Resp: 18    General: A&O x3 Cardiovascular: RRR Respiratory: good air movement CTA B/L  Discharge Instructions   Discharge Instructions    Diet - low sodium heart healthy    Complete by:  As directed      Increase activity slowly    Complete by:  As directed           Current Discharge Medication List    START taking these medications   Details  metoprolol tartrate (LOPRESSOR) 25 MG tablet Take 1 tablet (25 mg total) by mouth 2 (two) times daily. Qty: 60 tablet, Refills: 0      CONTINUE these medications which have NOT CHANGED   Details  ibuprofen (ADVIL,MOTRIN) 200 MG tablet Take 200 mg by mouth every 6 (six) hours as needed for mild pain or moderate pain. Liquid-Gels    LamoTRIgine XR (LAMICTAL XR) 200 MG TB24 Take 2 tablets (400 mg total) by mouth at bedtime. Qty: 60 tablet, Refills: 3    omeprazole (PRILOSEC) 20 MG capsule Take 20 mg by mouth daily.    oxcarbazepine (TRILEPTAL) 600 MG tablet Take 1 tablet (600 mg total) by mouth 2 (  two) times daily. Qty: 180 tablet, Refills: 1       Allergies  Allergen Reactions  . Zonisamide Anxiety    Panic Attacks  . Aspirin     sensitivity  . Ciprofloxacin     Seizures & it keeps her awake  . Codeine Hives  . Hydrocodone Hives  . Oxycodone Hives  . Penicillins Hives   Follow-up Information    Follow up with Virl Axe, MD.   Specialty:  Cardiology   Contact information:   [address] [place] [address] [phone]        The  results of significant diagnostics from this hospitalization (including imaging, microbiology, ancillary and laboratory) are listed below for reference.    Significant Diagnostic Studies: Dg Chest Port 1 View  12/14/2014   CLINICAL DATA:  Cardiac device in place  EXAM: PORTABLE CHEST - 1 VIEW  COMPARISON:  Chest x-ray from yesterday  FINDINGS: Interval removal of temporary pacer with placement of a dual-chamber pacer from the left. Leads overlap the expected location of the right ventricle and right atrial appendage.  Improving lung aeration with decreased atelectasis. Volume loss remains greater on the left. No edema, effusion, or pneumothorax. Normal heart size and mediastinal contours.  IMPRESSION: 1. No complicating features after dual-chamber pacer placement. 2. Improved aeration but still multi focal atelectasis.   Electronically Signed   By: Monte Fantasia M.D.   On: 12/14/2014 06:21   Dg Chest Port 1 View  12/13/2014   CLINICAL DATA:  Follow-up of respiratory failure  EXAM: PORTABLE CHEST - 1 VIEW  COMPARISON:  Portable chest x-ray of December 12, 2014  FINDINGS: The lungs are adequately inflated. Confluent alveolar opacity is more conspicuous in the right infrahilar region. There is no pleural effusion or pneumothorax. The heart and pulmonary vascularity are normal. The endotracheal tube tip lies 3.7 cm above the carina. The esophagogastric tube tip projects below the inferior margin of the study. External pacemaker defibrillator pads are present and stable. A temporary pacemaker lead is present in the right ventricle and appears stable.  IMPRESSION: Slight increase conspicuity of right infrahilar alveolar opacity is consistent with pneumonia. There is no CHF. The support tubes are in reasonable position.   Electronically Signed   By: David  Martinique M.D.   On: 12/13/2014 07:35   Dg Chest Portable 1 View  12/12/2014   CLINICAL DATA:  Endotracheal tube placement.  EXAM: PORTABLE CHEST - 1 VIEW  COMPARISON:   None.  FINDINGS: Endotracheal tube with tip between the clavicular heads and carina.  There is a temporary pacer lead from below which is likely at the right ventricular outflow tract. Heart size is normal. Streaky opacities in the lower lungs compatible with atelectasis. No pulmonary edema, effusion, or pneumothorax.  IMPRESSION: 1. Unremarkable positioning of endotracheal tube and temporary pacer. 2. Low lung volumes with atelectasis. 3. No cardiomegaly or pulmonary edema.   Electronically Signed   By: Monte Fantasia M.D.   On: 12/12/2014 06:15   Dg Abd Portable 1v  12/12/2014   CLINICAL DATA:  OG tube placement.  EXAM: PORTABLE ABDOMEN - 1 VIEW  COMPARISON:  None.  FINDINGS: OG tube noted with its tip projected over the distal stomach. No gastric distention. Right femoral venous catheter noted with tip projected over the pulmonary outflow tract. Soft tissue structures are unremarkable. No bowel distention or free air. Pelvic calcifications consistent phleboliths. Degenerative change lumbar spine and both hips.  IMPRESSION: OG tube noted  with tip projected over the stomach. No gastric or bowel distention .   Electronically Signed   By: Marcello Moores  Register   On: 12/12/2014 11:15   Dg C-arm 1-60 Min-no Report  12/12/2014   CLINICAL DATA: pacemaker placement   C-ARM 1-60 MINUTES  Fluoroscopy was utilized by the requesting physician.  No radiographic  interpretation.     Microbiology: Recent Results (from the past 240 hour(s))  MRSA PCR Screening     Status: None   Collection Time: 12/12/14  8:00 AM  Result Value Ref Range Status   MRSA by PCR NEGATIVE NEGATIVE Final    Comment:        The GeneXpert MRSA Assay (FDA approved for NASAL specimens only), is one component of a comprehensive MRSA colonization surveillance program. It is not intended to diagnose MRSA infection nor to guide or monitor treatment for MRSA infections.      Labs: Basic Metabolic Panel:  Recent Labs Lab 12/12/14 1206  12/13/14 0400 12/14/14 0406 12/16/14 0915 12/17/14 0353  NA 137 139 139 139 141  K 4.3 3.5 3.8 3.3* 3.7  CL 108 111 111 110 108  CO2 21* 21* 21* 21* 21*  GLUCOSE 96 105* 102* 121* 110*  BUN 8 7 7 7 9   CREATININE 0.92 0.86 0.80 0.80 0.79  CALCIUM 8.2* 8.1* 8.5* 9.1 9.0   Liver Function Tests:  Recent Labs Lab 12/12/14 0428 12/14/14 0406  AST 66* 19  ALT 39 25  ALKPHOS 146* 144*  BILITOT 0.7 0.9  PROT 5.4* 6.4*  ALBUMIN 3.0* 3.2*   No results for input(s): LIPASE, AMYLASE in the last 168 hours. No results for input(s): AMMONIA in the last 168 hours. CBC:  Recent Labs Lab 12/12/14 0410 12/12/14 0428 12/13/14 0400 12/14/14 0406  WBC  --  13.4* 11.4* 13.6*  NEUTROABS  --  9.4*  --  10.3*  HGB 13.9 13.7 13.2 13.8  HCT 41.0 41.3 39.1 42.2  MCV  --  94.5 92.7 94.0  PLT  --  235 222 176   Cardiac Enzymes:  Recent Labs Lab 12/12/14 0428 12/12/14 1130 12/12/14 1820  TROPONINI <0.03 0.16* 0.08*   BNP: BNP (last 3 results) No results for input(s): BNP in the last 8760 hours.  ProBNP (last 3 results) No results for input(s): PROBNP in the last 8760 hours.  CBG:  Recent Labs Lab 12/14/14 0432 12/14/14 0736 12/14/14 1555 12/15/14 2111 12/16/14 0624  GLUCAP 107* 96 111* 120* 99       Signed:  FELIZ ORTIZ, ABRAHAM  [facility] 12/17/2014, 7:34 AM

## 2014-12-17 NOTE — Consult Note (Signed)
Hospital Consult    Reason for Consult:  Mottling left hand Referring Physician:  [name] MRN #:  [id number]  History of Present Illness: This is a 54 y.o. female who was admitted on 12/12/14 after her sister witnessed her having a seizure.  In the ER, she was noted to have a HR in the 20's at which time, cardiology was called and transvenous pacer was placed via femoral access and admitted to ICU.  She subsequently had a PPM implanted.  A couple of days ago, she started having discoloration of her left wrist and hand, which has progressively gotten worse over the past couple of days.  She is tender over the wrist and the dorsum of the hand.    [facility] is consulted to make sure this is not a vascular issue.  Past Medical History  Diagnosis Date  . Seizures   . Diverticulitis   . GERD (gastroesophageal reflux disease)   . Hiatal hernia   . Wrist tendonitis     R   . Panic disorder 09/07/2014  . OSA on CPAP   . LBBB (left bundle branch block)     Past Surgical History  Procedure Laterality Date  . Tubal ligation    . Hand surgery    . Ep implantable device N/A 12/13/2014    Procedure: Pacemaker Implant;  Surgeon: Deboraha Sprang, MD;  Location: [facility];  Service: Cardiovascular;  Laterality: N/A;    Allergies  Allergen Reactions  . Zonisamide Anxiety    Panic Attacks  . Aspirin     sensitivity  . Ciprofloxacin     Seizures & it keeps her awake  . Codeine Hives  . Hydrocodone Hives  . Oxycodone Hives  . Penicillins Hives    Prior to Admission medications   Medication Sig Start Date End Date Taking? Authorizing Provider  ibuprofen (ADVIL,MOTRIN) 200 MG tablet Take 200 mg by mouth every 6 (six) hours as needed for mild pain or moderate pain. Liquid-Gels   Yes Historical Provider, MD  LamoTRIgine XR (LAMICTAL XR) 200 MG TB24 Take 2 tablets (400 mg total) by mouth at bedtime. 11/05/14  Yes Kathrynn Ducking, MD  omeprazole (PRILOSEC) 20 MG capsule Take 20 mg by mouth daily.    Yes Historical Provider, MD  oxcarbazepine (TRILEPTAL) 600 MG tablet Take 1 tablet (600 mg total) by mouth 2 (two) times daily. 09/10/14  Yes Kathrynn Ducking, MD  metoprolol tartrate (LOPRESSOR) 25 MG tablet Take 1 tablet (25 mg total) by mouth 2 (two) times daily. 12/17/14   Charlynne Cousins, MD    History   Social History  . Marital Status: Single    Spouse Name: N/A  . Number of Children: 0  . Years of Education: 14   Occupational History  . Not on file.   Social History Main Topics  . Smoking status: Current Every Day Smoker -- 1.00 packs/day for 35 years    Types: Cigarettes  . Smokeless tobacco: Never Used  . Alcohol Use: No     Comment: Quit 12/06/2009  . Drug Use: No  . Sexual Activity: Not on file   Other Topics Concern  . Not on file   Social History Narrative   Patient drinks 48 oz of soda daily.   Lives with sister   Some college   Left handed        Family History  Problem Relation Age of Onset  . Kidney failure Sister   . Diabetes Sister   .  Alzheimer's disease Mother   . Hypertension Mother   . Hypertension Father   . Stroke Father   . Coronary artery disease Maternal Grandfather   . Parkinson's disease Maternal Grandmother   . Stroke Brother   . Hypertension Brother   . Sleep apnea Brother     ROS: [x]  Positive   [ ]  Negative   [ ]  All sytems reviewed and are negative  Cardiovascular: []  chest pain/pressure [x]  heart block with recent PPM placement  Pulmonary: [x]  OSA on CPAP  Neurologic: [x]  seizures   Hematologic: []  hx of cancer  Endocrine:   []  diabetes []  thyroid disease  GI: [x]  GERD [x]  hiatal hernia  Psychiatric: []  anxiety []  depression  Integumentary: [x]  rashes left arm []  ulcers  Constitutional: []  fever []  chills   Physical Examination  Filed Vitals:   12/17/14 0617  BP: 169/81  Pulse: 97  Temp: 98.5 F (36.9 C)  Resp: 18   Body mass index is 30.76 kg/(m^2).  General:  WDWN in NAD Gait:  Not observed HENT: WNL, normocephalic Pulmonary: normal non-labored breathing, without Rales, rhonchi,  wheezing Cardiac: regular Skin: mottling on the dorsum of left hand up to the wrist on the radial side.  There is a puncture stick over the left radial artery area-unknown if this is an arterial stick.  Vascular Exam/Pulses:  Right Left  Radial 2+ (normal) 2+ (normal) with biphasic doppler signal  digital  Biphasic doppler signals  Palmer arch  Biphasic doppler signals  Ulnar 2+ (normal) 2+ (normal) with biphasic doppler signal   Extremities:  Left hand sensory and motor in tact. Musculoskeletal: no muscle wasting or atrophy  Neurologic: A&O X 3; Appropriate Affect ; SENSATION: normal; MOTOR FUNCTION:  moving all extremities equally. Speech is fluent/normal  CBC    Component Value Date/Time   WBC 13.6* 12/14/2014 0406   WBC 9.8 09/04/2014 0916   RBC 4.49 12/14/2014 0406   RBC 5.20 09/04/2014 0916   HGB 13.8 12/14/2014 0406   HCT 42.2 12/14/2014 0406   PLT 176 12/14/2014 0406   MCV 94.0 12/14/2014 0406   MCH 30.7 12/14/2014 0406   MCH 30.8 09/04/2014 0916   MCHC 32.7 12/14/2014 0406   MCHC 34.3 09/04/2014 0916   RDW 14.6 12/14/2014 0406   RDW 13.3 09/04/2014 0916   LYMPHSABS 2.3 12/14/2014 0406   LYMPHSABS 3.2* 09/04/2014 0916   MONOABS 0.9 12/14/2014 0406   EOSABS 0.0 12/14/2014 0406   EOSABS 0.1 09/04/2014 0916   BASOSABS 0.0 12/14/2014 0406   BASOSABS 0.0 09/04/2014 0916    BMET    Component Value Date/Time   NA 141 12/17/2014 0353   NA 142 09/04/2014 0916   K 3.7 12/17/2014 0353   CL 108 12/17/2014 0353   CO2 21* 12/17/2014 0353   GLUCOSE 110* 12/17/2014 0353   GLUCOSE 100* 09/04/2014 0916   BUN 9 12/17/2014 0353   BUN 8 09/04/2014 0916   CREATININE 0.79 12/17/2014 0353   CALCIUM 9.0 12/17/2014 0353   GFRNONAA >60 12/17/2014 0353   GFRAA >60 12/17/2014 0353    COAGS: No results found for: INR, PROTIME   Non-Invasive Vascular Imaging:    none  Statin:  No. Beta Blocker:  No. Aspirin:  No. ACEI:  No. ARB:  No. Other antiplatelets/anticoagulants:  No.    ASSESSMENT/PLAN: This is a 54 y.o. female with seizures and heart block s/p PPM insertion on the left now with mottling/skin changes    -she does have easily palpable left  radial and ulnar pulses as well as good palmer arch and digital doppler signals in tact. -Dr. Donnetta Hutching does not feel that this is due to a circulation issue and may very well be some sort of drug reaction. -she should f/u with her PCP in a week to evaluate her hand as Dr. Donnetta Hutching informed her that some of the areas may blister before this resolves.  She may benefit from a dermatology consult if this does not improve/resolve.   Leontine Locket, PA-C [facility] [phone]  I have examined the patient, reviewed and agree with above. Unusual presentation. Skin changes from mid to distal forearm onto the dorsum of her hand. Does have a puncture at the level of the radial artery at the wrist. Easily palpable radial and ulnar pulse. Normal Doppler flow in her palmar arch and normal digital artery flow by Doppler in her fingers. Unclear as to the cause of the skin changes but does not appear to be related to ischemia or embolus. Discussed this with the patient. Concerned some of these areas may actually blister. She will follow-up with her primary care if this does not resolve spontaneously  Curt Jews, MD 12/18/2014 2:38 PM

## 2014-12-17 NOTE — Progress Notes (Signed)
DC instructions given to patient at this time re: f/u appts, activity, home meds/ s/s of problems.  Pt verbalized understanding of all instructions.

## 2014-12-17 NOTE — Care Management Note (Signed)
Case Management Note  Patient Details  Name: [name] [name] MRN: [id number] Date of Birth: 1961/05/22  Subjective/Objective:   Pt admitted with acute respiratory failure with hypoxia                 Action/Plan:  CM assessed pt this am, pt is independent from home with husband.  Pt has discharge orders signed   Expected Discharge Date:                  Expected Discharge Plan:  [place]  In-House Referral:     Discharge planning Services  CM Consult  Post Acute Care Choice:    Choice offered to:     DME Arranged:    DME Agency:     HH Arranged:    [address] Agency:     Status of Service; Complete, will sign off  Medicare Important Message Given:  No Date Medicare IM Given:    Medicare IM give by:    Date Additional Medicare IM Given:    Additional Medicare Important Message give by:     If discussed at [place] of Stay Meetings, dates discussed:    Additional Comments: CM was told by pt that she did not want HHPT as recommended.  Pt stated she already had walker and cane at home from sister in case she needed it.  Pt stated she did not feel that she needed PT, per pt she has been walking fine.   Maryclare Labrador, RN 12/17/2014, 8:59 AM

## 2014-12-17 NOTE — Discharge Instructions (Signed)
° ° °  Supplemental Discharge Instructions for  Pacemaker/Defibrillator Patients  Activity No heavy lifting or vigorous activity with your left/right arm for 6 to 8 weeks.  Do not raise your left/right arm above your head for one week.  Gradually raise your affected arm as drawn below.           __   12/21/14                     12/22/14                          12/23/14             12/24/14  NO DRIVING for     ; you may begin driving on     .  WOUND CARE - Keep the wound area clean and dry.  Do not get this area wet for one week. No showers for one week; you may shower on     . - The tape/steri-strips on your wound will fall off; do not pull them off.  No bandage is needed on the site.  DO  NOT apply any creams, oils, or ointments to the wound area. - If you notice any drainage or discharge from the wound, any swelling or bruising at the site, or you develop a fever > 101? F after you are discharged home, call the office at once.  Special Instructions - You are still able to use cellular telephones; use the ear opposite the side where you have your pacemaker/defibrillator.  Avoid carrying your cellular phone near your device. - When traveling through airports, show security personnel your identification card to avoid being screened in the metal detectors.  Ask the security personnel to use the hand wand. - Avoid arc welding equipment, MRI testing (magnetic resonance imaging), TENS units (transcutaneous nerve stimulators).  Call the office for questions about other devices. - Avoid electrical appliances that are in poor condition or are not properly grounded. - Microwave ovens are safe to be near or to operate.

## 2014-12-18 ENCOUNTER — Telehealth: Payer: Self-pay | Admitting: Neurology

## 2014-12-18 NOTE — Telephone Encounter (Signed)
I called the patient. She was in the hospital over the last several days. She went to the hospital for seizures and she states she ended up having to have a pacemaker placed. Her discharge summary does not make any adjustments to her seizure medications. She wanted to find out from [name] if she should continue her usual seizure therapy or if anything should be changed.

## 2014-12-18 NOTE — Telephone Encounter (Signed)
I called patient. The patient had a pacemaker placed, heart rates were dropping into the 20s. She has had witnessed events of generalized stiffening and convulsions with this event. Hopefully, the seizures were related to the heart rate issue, but I would not alter the seizure medications yet. The patient will follow-up in our office on 01/07/2015. The patient goes about 3 months without any more seizure events, we may try to taper off of her seizure medications.

## 2014-12-18 NOTE — Telephone Encounter (Signed)
Patient called and requested to speak with [facility] RN regarding some questions she has about her medications. Please call and advise.

## 2014-12-26 ENCOUNTER — Ambulatory Visit (INDEPENDENT_AMBULATORY_CARE_PROVIDER_SITE_OTHER): Payer: BLUE CROSS/BLUE SHIELD | Admitting: *Deleted

## 2014-12-26 DIAGNOSIS — I442 Atrioventricular block, complete: Secondary | ICD-10-CM

## 2014-12-26 DIAGNOSIS — R001 Bradycardia, unspecified: Secondary | ICD-10-CM | POA: Diagnosis not present

## 2014-12-26 LAB — CUP PACEART INCLINIC DEVICE CHECK
Brady Statistic AP VP Percent: 2.58 %
Brady Statistic AS VP Percent: 7.71 %
Brady Statistic AS VS Percent: 89.44 %
Brady Statistic RA Percent Paced: 2.85 %
Brady Statistic RV Percent Paced: 10.29 %
Lead Channel Impedance Value: 380 Ohm
Lead Channel Impedance Value: 456 Ohm
Lead Channel Impedance Value: 608 Ohm
Lead Channel Impedance Value: 684 Ohm
Lead Channel Pacing Threshold Amplitude: 1 V
Lead Channel Sensing Intrinsic Amplitude: 20 mV
Lead Channel Sensing Intrinsic Amplitude: 4.875 mV
Lead Channel Setting Sensing Sensitivity: 4 mV
MDC IDC MSMT BATTERY VOLTAGE: 3.13 V
MDC IDC MSMT LEADCHNL RA PACING THRESHOLD AMPLITUDE: 0.5 V
MDC IDC MSMT LEADCHNL RA PACING THRESHOLD PULSEWIDTH: 0.4 ms
MDC IDC MSMT LEADCHNL RV PACING THRESHOLD PULSEWIDTH: 0.4 ms
MDC IDC SESS DTM: 20160720155452
MDC IDC SET LEADCHNL RA PACING AMPLITUDE: 3.5 V
MDC IDC SET LEADCHNL RV PACING AMPLITUDE: 3.5 V
MDC IDC SET LEADCHNL RV PACING PULSEWIDTH: 0.4 ms
MDC IDC SET ZONE DETECTION INTERVAL: 350 ms
MDC IDC STAT BRADY AP VS PERCENT: 0.27 %
Zone Setting Detection Interval: 400 ms

## 2014-12-26 NOTE — Progress Notes (Signed)
Wound check appointment. Steri-strips removed. Wound without redness or edema. Incision edges approximated, wound well healed. Normal device function. Thresholds, sensing, and impedances consistent with implant measurements. Device programmed at 3.5V/auto capture programmed on for extra safety margin until 3 month visit. Histogram distribution appropriate for patient and level of activity. No mode switches or high ventricular rates noted. Patient educated about wound care, arm mobility, lifting restrictions. ROV in 3 months with SK.

## 2014-12-27 ENCOUNTER — Other Ambulatory Visit: Payer: Self-pay

## 2014-12-27 MED ORDER — LAMOTRIGINE ER 200 MG PO TB24: 400.0000 mg | ORAL_TABLET | Freq: Every day | ORAL | Status: DC

## 2014-12-27 NOTE — Telephone Encounter (Signed)
Pharmacy requests 90 day Rx for ins reasons

## 2014-12-30 ENCOUNTER — Other Ambulatory Visit: Payer: Self-pay | Admitting: Neurology

## 2015-01-07 ENCOUNTER — Ambulatory Visit (INDEPENDENT_AMBULATORY_CARE_PROVIDER_SITE_OTHER): Payer: BLUE CROSS/BLUE SHIELD | Admitting: Nurse Practitioner

## 2015-01-07 ENCOUNTER — Encounter: Payer: Self-pay | Admitting: Nurse Practitioner

## 2015-01-07 VITALS — BP 134/76 | HR 64 | Ht 62.0 in | Wt 169.0 lb

## 2015-01-07 DIAGNOSIS — R569 Unspecified convulsions: Secondary | ICD-10-CM | POA: Diagnosis not present

## 2015-01-07 NOTE — Progress Notes (Signed)
I have read the note, and I agree with the clinical assessment and plan.  WILLIS,CHARLES KEITH   

## 2015-01-07 NOTE — Patient Instructions (Addendum)
Continue Lamictal and Trileptal at current doses F/U in September  With [name] already scheduled

## 2015-01-07 NOTE — Progress Notes (Signed)
[facility]  PATIENT: [name] [name] DOB: [birth year]   REASON FOR VISIT: Follow-up for history of seizure disorder , orthostatic hypotension HISTORY FROM: Patient and brother  HISTORY OF PRESENT ILLNESS:[name] [name] is a 54 year old left-handed white female with a history of intractable epilepsy. The patient was  averaging 2 or 3 seizures a month. She was last seen by Dr. Jannifer [name] 11/01/2014. The patient has been on Trileptal, she has run good blood levels with 600 mg twice daily dosing. She has been placed on Lamictal after it was felt that she could not tolerate the Zonegran secondary to panic attacks. She has had no further panic events, and she is on Lamictal  400mg  ER   daily   She indicates that within 2 hours after the dosing of Lamictal, she will feel dizziness . This did not change with ER formulation. She has not had any falls. Her last seizure event occurred 12/12/2014 when she went to the emergency room with altered mental status and was found to be in complete heart block. She received a permanent pacemaker. She has not had further seizure events. She complains of dizziness when changing positions.  She returns for an evaluation.    REVIEW OF SYSTEMS: Full 14 system review of systems performed and notable only for those listed, all others are neg:  Constitutional: neg  Cardiovascular: neg Ear/Nose/Throat: neg  Skin: neg Eyes: neg Respiratory: neg Gastroitestinal: neg  Hematology/Lymphatic: neg  Endocrine: neg Musculoskeletal: Walking difficulty Allergy/Immunology: neg Neurological: Dizziness seizure Psychiatric: neg Sleep : neg   ALLERGIES: Allergies  Allergen Reactions  . Zonisamide Anxiety    Panic Attacks  . Aspirin     sensitivity  . Ciprofloxacin     Seizures & it keeps her awake  . Codeine Hives  . Hydrocodone Hives  . Oxycodone Hives  . Penicillins Hives    HOME MEDICATIONS: Outpatient Prescriptions Prior to Visit  Medication  Sig Dispense Refill  . ibuprofen (ADVIL,MOTRIN) 200 MG tablet Take 200 mg by mouth every 6 (six) hours as needed for mild pain or moderate pain. Liquid-Gels    . LamoTRIgine XR (LAMICTAL XR) 200 MG TB24 Take 2 tablets (400 mg total) by mouth at bedtime. 180 tablet 0  . metoprolol tartrate (LOPRESSOR) 25 MG tablet Take 1 tablet (25 mg total) by mouth 2 (two) times daily. 60 tablet 0  . omeprazole (PRILOSEC) 20 MG capsule Take 20 mg by mouth daily.    Marland Kitchen oxcarbazepine (TRILEPTAL) 600 MG tablet Take 1 tablet (600 mg total) by mouth 2 (two) times daily. 180 tablet 1   No facility-administered medications prior to visit.    PAST MEDICAL HISTORY: Past Medical History  Diagnosis Date  . Seizures   . Diverticulitis   . GERD (gastroesophageal reflux disease)   . Hiatal hernia   . Wrist tendonitis     R   . Panic disorder 09/07/2014  . OSA on CPAP   . LBBB (left bundle branch block)     PAST SURGICAL HISTORY: Past Surgical History  Procedure Laterality Date  . Tubal ligation    . Hand surgery    . Ep implantable device N/A 12/13/2014    Procedure: Pacemaker Implant;  Surgeon: Deboraha Sprang, MD;  Location: [facility];  Service: Cardiovascular;  Laterality: N/A;    FAMILY HISTORY: Family History  Problem Relation Age of Onset  . Kidney failure Sister   . Diabetes Sister   . Alzheimer's disease Mother   .  Hypertension Mother   . Hypertension Father   . Stroke Father   . Coronary artery disease Maternal Grandfather   . Parkinson's disease Maternal Grandmother   . Stroke Brother   . Hypertension Brother   . Sleep apnea Brother     SOCIAL HISTORY: History   Social History  . Marital Status: Single    Spouse Name: N/A  . Number of Children: 0  . Years of Education: 14   Occupational History  . Not on file.   Social History Main Topics  . Smoking status: Current Every Day Smoker -- 1.00 packs/day for 35 years    Types: Cigarettes  . Smokeless tobacco: Never Used  .  Alcohol Use: No     Comment: Quit 12/06/2009  . Drug Use: No  . Sexual Activity: Not on file   Other Topics Concern  . Not on file   Social History Narrative   Patient drinks 48 oz of soda daily.   Lives with sister   Some college   Left handed        PHYSICAL EXAM  Filed Vitals:   01/07/15 1024  BP: Lying 140/70 seated 130/70 standing 124/72  Pulse: 64  Height: 5\' 2"  (1.575 m)  Weight: 169 lb (76.658 kg)   Body mass index is 30.9 kg/(m^2). General: The patient is alert and cooperative at the time of the examination. The patient is moderately obese. Skin: No significant peripheral edema is noted.   Neurologic Exam Mental status: The patient is alert and oriented x 3 at the time of the examination. The patient has apparent normal recent and remote memory, with an apparently normal attention span and concentration ability. Cranial nerves: Facial symmetry is present. Speech is normal, no aphasia or dysarthria is noted. Extraocular movements are full. Visual fields are full. Motor: The patient has good strength in all 4 extremities. Sensory examination: Soft touch sensation is symmetric on the face, arms, and legs. Coordination: The patient has good finger-nose-finger and heel-to-shin bilaterally. Gait and station: The patient has a normal gait. Tandem gait is normal. Romberg is negative. No drift is seen. Reflexes: Deep tendon reflexes are symmetric.   DIAGNOSTIC DATA (LABS, IMAGING, TESTING) - I reviewed patient records, labs, notes, testing and imaging myself where available.  Lab Results  Component Value Date   WBC 13.6* 12/14/2014   HGB 13.8 12/14/2014   HCT 42.2 12/14/2014   MCV 94.0 12/14/2014   PLT 176 12/14/2014      Component Value Date/Time   NA 141 12/17/2014 0353   NA 142 09/04/2014 0916   K 3.7 12/17/2014 0353   CL 108 12/17/2014 0353   CO2 21* 12/17/2014 0353   GLUCOSE 110* 12/17/2014 0353   GLUCOSE 100* 09/04/2014 0916   BUN 9 12/17/2014 0353    BUN 8 09/04/2014 0916   CREATININE 0.79 12/17/2014 0353   CALCIUM 9.0 12/17/2014 0353   PROT 6.4* 12/14/2014 0406   ALBUMIN 3.2* 12/14/2014 0406   AST 19 12/14/2014 0406   ALT 25 12/14/2014 0406   ALKPHOS 144* 12/14/2014 0406   BILITOT 0.9 12/14/2014 0406   GFRNONAA >60 12/17/2014 0353   GFRAA >60 12/17/2014 0353   Lab Results  Component Value Date   TRIG 166* 12/15/2014    Lab Results  Component Value Date   TSH 1.399 12/12/2014      ASSESSMENT AND PLAN  54 y.o. year old female  has a past medical history of Seizures;  Panic disorder (09/07/2014); OSA on  CPAP; and complete heart block with permanent pacemaker 12/12/2014. Patient has not had further seizure events since that time. She was also noted to have orthostatic hypotension today.Lying B/P 140/70 seated 130/70 and standing 124/72.  Continue Lamictal and Trileptal at current doses for now Discussed orthostatic hypotension such as causes and signs and symptoms Important to stand up slowly after sitting or lying down Importance of staying well hydrated with water and not sodas ,  caffeinated products etc. F/U in September  With Dr. Jannifer [name] already scheduled Dennie Bible, [facility], [facility], [facility] [address], [place], [place] [address] [phone]

## 2015-01-15 ENCOUNTER — Other Ambulatory Visit: Payer: Self-pay | Admitting: *Deleted

## 2015-01-15 MED ORDER — METOPROLOL TARTRATE 25 MG PO TABS: 25.0000 mg | ORAL_TABLET | Freq: Two times a day (BID) | ORAL | Status: DC

## 2015-01-31 ENCOUNTER — Encounter: Payer: Self-pay | Admitting: Internal Medicine

## 2015-02-06 ENCOUNTER — Other Ambulatory Visit: Payer: Self-pay | Admitting: *Deleted

## 2015-02-06 MED ORDER — METOPROLOL TARTRATE 25 MG PO TABS: 25.0000 mg | ORAL_TABLET | Freq: Two times a day (BID) | ORAL | Status: DC

## 2015-02-14 ENCOUNTER — Other Ambulatory Visit: Payer: Self-pay | Admitting: *Deleted

## 2015-02-14 ENCOUNTER — Telehealth: Payer: Self-pay | Admitting: Internal Medicine

## 2015-02-14 MED ORDER — METOPROLOL TARTRATE 25 MG PO TABS: 25.0000 mg | ORAL_TABLET | Freq: Two times a day (BID) | ORAL | Status: DC

## 2015-02-14 NOTE — Telephone Encounter (Deleted)
ERROR

## 2015-03-07 ENCOUNTER — Encounter: Payer: Self-pay | Admitting: Neurology

## 2015-03-07 ENCOUNTER — Ambulatory Visit (INDEPENDENT_AMBULATORY_CARE_PROVIDER_SITE_OTHER): Payer: BLUE CROSS/BLUE SHIELD | Admitting: Neurology

## 2015-03-07 VITALS — BP 148/77 | HR 77 | Ht 62.0 in | Wt 171.0 lb

## 2015-03-07 DIAGNOSIS — R569 Unspecified convulsions: Secondary | ICD-10-CM | POA: Diagnosis not present

## 2015-03-07 DIAGNOSIS — F5104 Psychophysiologic insomnia: Secondary | ICD-10-CM

## 2015-03-07 DIAGNOSIS — G4700 Insomnia, unspecified: Secondary | ICD-10-CM | POA: Diagnosis not present

## 2015-03-07 HISTORY — DX: Psychophysiologic insomnia: F51.04

## 2015-03-07 MED ORDER — LAMOTRIGINE 100 MG PO TABS: ORAL_TABLET | ORAL | Status: DC

## 2015-03-07 MED ORDER — TRAZODONE HCL 150 MG PO TABS: 150.0000 mg | ORAL_TABLET | Freq: Every day | ORAL | Status: DC

## 2015-03-07 NOTE — Patient Instructions (Addendum)
   We will cut back on the Lamictal taking 100 mg in the morning and 200 mg in the evening.  Seizure, Adult A seizure is abnormal electrical activity in the brain. Seizures usually last from 30 seconds to 2 minutes. There are various types of seizures. Before a seizure, you may have a warning sensation (aura) that a seizure is about to occur. An aura may include the following symptoms:   Fear or anxiety.  Nausea.  Feeling like the room is spinning (vertigo).  Vision changes, such as seeing flashing lights or spots. Common symptoms during a seizure include:  A change in attention or behavior (altered mental status).  Convulsions with rhythmic jerking movements.  Drooling.  Rapid eye movements.  Grunting.  Loss of bladder and bowel control.  Bitter taste in the mouth.  Tongue biting. After a seizure, you may feel confused and sleepy. You may also have an injury resulting from convulsions during the seizure. HOME CARE INSTRUCTIONS   If you are given medicines, take them exactly as prescribed by your health care Betsaida [place].  Keep all follow-up appointments as directed by your health care Merrill Deanda.  Do not swim or drive or engage in risky activity during which a seizure could cause further injury to you or others until your health care Jrake Rodriquez says it is OK.  Get adequate rest.  Teach friends and family what to do if you have a seizure. They should:  Lay you on the ground to prevent a fall.  Put a cushion under your head.  Loosen any tight clothing around your neck.  Turn you on your side. If vomiting occurs, this helps keep your airway clear.  Stay with you until you recover.  Know whether or not you need emergency care. SEEK IMMEDIATE MEDICAL CARE IF:  The seizure lasts longer than 5 minutes.  The seizure is severe or you do not wake up immediately after the seizure.  You have an altered mental status after the seizure.  You are having more frequent or  worsening seizures. Someone should drive you to the emergency department or call local emergency services (911 in [place] MAKE SURE YOU:  Understand these instructions.  Will watch your condition.  Will get help right away if you are not doing well or get worse. Document Released: 05/22/2000 Document Revised: 03/15/2013 Document Reviewed: 01/04/2013 [facility] Patient Information 2015 [place], [place]. This information is not intended to replace advice given to you by your health care Nakina Spatz. Make sure you discuss any questions you have with your health care Yoltzin Ransom.

## 2015-03-07 NOTE — Progress Notes (Signed)
Reason for visit: seizures  [name] [name] is an 54 y.o. female  History of present illness:  [name] [name] is a 54 year old left-handed white female with a history of seizure-type events. The patient recently was in the hospital in early July 2016, she was found to have had a typical seizure-type event, but she was noted to be in complete heart block with the episode. The patient required a cardiac pacemaker placement, she indicates that she has not had any overt seizure since this has been placed. The patient will have episodes where she feels that she might have a seizure, but she never goes into an episode. The patient is on Trileptal and Lamictal. The patient is not operating a motor vehicle. She continues to have chronic issues with insomnia. This has been a problem for her since she was in her 37s. She returns to the office today for an evaluation.  Past Medical History  Diagnosis Date  . Seizures   . Diverticulitis   . GERD (gastroesophageal reflux disease)   . Hiatal hernia   . Wrist tendonitis     R   . Panic disorder 09/07/2014  . OSA on CPAP   . LBBB (left bundle branch block)   . Chronic insomnia 03/07/2015    Past Surgical History  Procedure Laterality Date  . Tubal ligation    . Hand surgery    . Ep implantable device N/A 12/13/2014    Procedure: Pacemaker Implant;  Surgeon: Deboraha Sprang, MD;  Location: [facility];  Service: Cardiovascular;  Laterality: N/A;    Family History  Problem Relation Age of Onset  . Kidney failure Sister   . Diabetes Sister   . Alzheimer's disease Mother   . Hypertension Mother   . Hypertension Father   . Stroke Father   . Coronary artery disease Maternal Grandfather   . Parkinson's disease Maternal Grandmother   . Stroke Brother   . Hypertension Brother   . Sleep apnea Brother     Social history:  reports that she has been smoking Cigarettes.  She has a 17.5 pack-year smoking history. She has never used smokeless  tobacco. She reports that she does not drink alcohol or use illicit drugs.    Allergies  Allergen Reactions  . Zonisamide Anxiety    Panic Attacks  . Aspirin     sensitivity  . Ciprofloxacin     Seizures & it keeps her awake  . Codeine Hives  . Hydrocodone Hives  . Oxycodone Hives  . Penicillins Hives    Medications:  Prior to Admission medications   Medication Sig Start Date End Date Taking? Authorizing Bastien Strawser  ibuprofen (ADVIL,MOTRIN) 200 MG tablet Take 200 mg by mouth every 6 (six) hours as needed for mild pain or moderate pain. Liquid-Gels   Yes Historical Sharmarke Cicio, MD  LamoTRIgine XR (LAMICTAL XR) 200 MG TB24 Take 2 tablets (400 mg total) by mouth at bedtime. 12/27/14  Yes Kathrynn Ducking, MD  metoprolol tartrate (LOPRESSOR) 25 MG tablet Take 1 tablet (25 mg total) by mouth 2 (two) times daily. 02/14/15  Yes Deboraha Sprang, MD  omeprazole (PRILOSEC) 20 MG capsule Take 20 mg by mouth daily.   Yes Historical Monseratt Ledin, MD  oxcarbazepine (TRILEPTAL) 600 MG tablet Take 1 tablet (600 mg total) by mouth 2 (two) times daily. 09/10/14  Yes Kathrynn Ducking, MD    ROS:  Out of a complete 14 system review of symptoms, the patient complains only  of the following symptoms, and all other reviewed systems are negative.  Insomnia Seizures  Blood pressure 148/77, pulse 77, height 5\' 2"  (1.575 m), weight 171 lb (77.565 kg).  Physical Exam  General: The patient is alert and cooperative at the time of the examination. The patient is moderately obese.  Skin: [name] significant peripheral edema is noted.   Neurologic Exam  Mental status: The patient is alert and oriented x 3 at the time of the examination. The patient has apparent normal recent and remote memory, with an apparently normal attention span and concentration ability.   Cranial nerves: Facial symmetry is present. Speech is normal, [name] aphasia or dysarthria is noted. Extraocular movements are full. Visual fields are full.  Motor:  The patient has good strength in all 4 extremities.  Sensory examination: Soft touch sensation is symmetric on the face, arms, and legs.  Coordination: The patient has good finger-nose-finger and heel-to-shin bilaterally.  Gait and station: The patient has a normal gait. Tandem gait is normal. Romberg is negative. [name] drift is seen.  Reflexes: Deep tendon reflexes are symmetric.   Assessment/Plan:  1. History of seizures  2. Complete heart block, pacemaker replacement  3. Chronic insomnia  Hopefully, the episodes of heart block may be the etiology of the seizure-type events. If the patient does well without recurrence of her events for another 4 or 5 months, we will may consider tapering her off of the seizure medications. Prior EEG studies have been normal, with exception of the first EEG which showed some slight intermittent left temporal slowing. The patient will be placed on trazodone to sleep at night, 150 mg. The Lamictal dosing was reduced to 100 mg in the morning, 200 mg in the evening.  Jill Alexanders MD 03/07/2015 7:08 PM  [facility] [address] [place], [place] [address]  Phone [phone] Fax [phone]

## 2015-03-11 ENCOUNTER — Telehealth: Payer: Self-pay | Admitting: Neurology

## 2015-03-11 ENCOUNTER — Encounter: Payer: Self-pay | Admitting: Internal Medicine

## 2015-03-11 ENCOUNTER — Ambulatory Visit (INDEPENDENT_AMBULATORY_CARE_PROVIDER_SITE_OTHER): Payer: BLUE CROSS/BLUE SHIELD | Admitting: Internal Medicine

## 2015-03-11 VITALS — BP 134/76 | HR 68 | Ht 62.0 in | Wt 173.6 lb

## 2015-03-11 DIAGNOSIS — Z45018 Encounter for adjustment and management of other part of cardiac pacemaker: Secondary | ICD-10-CM | POA: Diagnosis not present

## 2015-03-11 DIAGNOSIS — Z959 Presence of cardiac and vascular implant and graft, unspecified: Secondary | ICD-10-CM

## 2015-03-11 DIAGNOSIS — I442 Atrioventricular block, complete: Secondary | ICD-10-CM | POA: Diagnosis not present

## 2015-03-11 DIAGNOSIS — R001 Bradycardia, unspecified: Secondary | ICD-10-CM | POA: Diagnosis not present

## 2015-03-11 LAB — CUP PACEART INCLINIC DEVICE CHECK
Battery Remaining Longevity: 137 mo
Battery Voltage: 3.07 V
Brady Statistic AS VP Percent: 24.49 %
Brady Statistic RA Percent Paced: 7.84 %
Lead Channel Impedance Value: 399 Ohm
Lead Channel Impedance Value: 437 Ohm
Lead Channel Pacing Threshold Amplitude: 0.5 V
Lead Channel Pacing Threshold Pulse Width: 0.4 ms
Lead Channel Pacing Threshold Pulse Width: 0.4 ms
Lead Channel Sensing Intrinsic Amplitude: 21.875 mV
Lead Channel Sensing Intrinsic Amplitude: 4.625 mV
Lead Channel Setting Pacing Amplitude: 1.5 V
Lead Channel Setting Pacing Amplitude: 2.5 V
Lead Channel Setting Pacing Pulse Width: 0.4 ms
Lead Channel Setting Sensing Sensitivity: 4 mV
MDC IDC MSMT LEADCHNL RV IMPEDANCE VALUE: 741 Ohm
MDC IDC MSMT LEADCHNL RV IMPEDANCE VALUE: 817 Ohm
MDC IDC MSMT LEADCHNL RV PACING THRESHOLD AMPLITUDE: 0.5 V
MDC IDC SESS DTM: 20161003094929
MDC IDC SET ZONE DETECTION INTERVAL: 400 ms
MDC IDC STAT BRADY AP VP PERCENT: 7.41 %
MDC IDC STAT BRADY AP VS PERCENT: 0.43 %
MDC IDC STAT BRADY AS VS PERCENT: 67.67 %
MDC IDC STAT BRADY RV PERCENT PACED: 31.9 %
Zone Setting Detection Interval: 350 ms

## 2015-03-11 MED ORDER — OXCARBAZEPINE 600 MG PO TABS: 600.0000 mg | ORAL_TABLET | Freq: Two times a day (BID) | ORAL | Status: DC

## 2015-03-11 NOTE — Patient Instructions (Addendum)
Medication Instructions:  Your physician has recommended you make the following change in your medication:  1) HOLD Metoprolol for 1-2 weeks to see if improvement in symptoms.  Please call us and let us know how you are feeling. Continue to monitor your blood pressure and call if it begins to increase.  Labwork: None ordered  Testing/Procedures: None ordered  Follow-Up: Remote monitoring is used to monitor your Pacemaker of ICD from home. This monitoring reduces the number of office visits required to check your device to one time per year. It allows [place] to keep an eye on the functioning of your device to ensure it is working properly. You are scheduled for a device check from home on 06/10/14. You may send your transmission at any time that day. If you have a wireless device, the transmission will be sent automatically. After your physician reviews your transmission, you will receive a postcard with your next transmission date.  Your physician wants you to follow-up in: 9 months with Dr. Caryl Comes. You will receive a reminder letter in the mail two months in advance. If you don't receive a letter, please call our office to schedule the follow-up appointment.  Any Other Special Instructions Will Be Listed Below (If Applicable). Call us in 2 weeks and let us know how your shoulder is doing.  Thank you for choosing [facility]!!

## 2015-03-11 NOTE — Telephone Encounter (Signed)
Patient is calling to order Rx oxcarbazepine 600 90 day supply.  Thanks!

## 2015-03-11 NOTE — Progress Notes (Signed)
.      Patient Care Team: Aretta Nip, MD as PCP - General (Family Medicine)   HPI  [name] [name] is a 54 y.o. female Seen in follow-up for pacemaker implanted for intermittent complete heart block a history of "seizures" associated with documented bradycardia and complete heart block int he setting of underlying left bundle branch block.  Discussions with neurology thereafter prompted the discontinuation of her antiepileptics; however, further discussions with her primary neurologist redirected that plan the thought that drugs would be weaned over 6 months.  She continues to struggle with balance. I've encouraged her to follow up with neurology.   she has pain in her left shoulder and restricted motion.   Records and Results Reviewed neurology NOTES  Past Medical History  Diagnosis Date  . Seizures ([place])   . Diverticulitis   . GERD (gastroesophageal reflux disease)   . Hiatal hernia   . Wrist tendonitis     R   . Panic disorder 09/07/2014  . OSA on CPAP   . LBBB (left bundle branch block)   . Chronic insomnia 03/07/2015    Past Surgical History  Procedure Laterality Date  . Tubal ligation    . Hand surgery    . Ep implantable device N/A 12/13/2014    Procedure: Pacemaker Implant;  Surgeon: Deboraha Sprang, MD;  Location: [facility];  Service: Cardiovascular;  Laterality: N/A;    Current Outpatient Prescriptions  Medication Sig Dispense Refill  . ibuprofen (ADVIL,MOTRIN) 200 MG tablet Take 200 mg by mouth every 6 (six) hours as needed for mild pain or moderate pain. Liquid-Gels    . lamoTRIgine (LAMICTAL) 100 MG tablet One tablet in the morning and 2 in the evening 90 tablet 3  . metoprolol tartrate (LOPRESSOR) 25 MG tablet Take 1 tablet (25 mg total) by mouth 2 (two) times daily. 180 tablet 0  . omeprazole (PRILOSEC) 20 MG capsule Take 20 mg by mouth daily.    Marland Kitchen oxcarbazepine (TRILEPTAL) 600 MG tablet Take 1 tablet (600 mg total) by mouth 2 (two)  times daily. 180 tablet 1  . traZODone (DESYREL) 150 MG tablet Take 1 tablet (150 mg total) by mouth at bedtime. 30 tablet 1   No current facility-administered medications for this visit.    Allergies  Allergen Reactions  . Zonisamide Anxiety    Panic Attacks  . Aspirin     sensitivity  . Ciprofloxacin     Seizures & it keeps her awake  . Codeine Hives  . Hydrocodone Hives  . Oxycodone Hives  . Penicillins Hives      Review of Systems negative except from HPI and PMH  Physical Exam BP 134/76 mmHg  Pulse 68  Ht 5\' 2"  (1.575 m)  Wt 173 lb 9.6 oz (78.744 kg)  BMI 31.74 kg/m2 Well developed and well nourished in no acute distress HENT normal E scleral and icterus clear Neck Supple JVP flat; carotids brisk and full Clear to ausculation Device pocket well healed; without hematoma or erythema.  There is no tethering Regular rate and rhythm, no murmurs gallops or rub Soft with active bowel sounds No clubbing cyanosis   Edema Alert and oriented, grossly normal motor and sensory function  she has restricted motion of her left arm Skin Werm and Dry   ECG sinus rhythm at 68 Intervals 21/13/43 Left bundle branch block   Assessment and  Plan  Intermittent complete heart block   Hypertension   Pacemaker  Adhesive capsulitis   Seizures    The patient has intermittent complete heart block which was associated with one of her typical seizures. It was thought that maybe her seizures were secondary. She however gives me a history of olfactory hallucinations ache anything that she may have 2 separate issues.  Interestingly, she also has a prodrome that she recognizes with her "seizures" and is associated with significant fatigue. This also supports a diagnosis of the seizures as a neurological issue as well as possibly a vasovagal motor event.  Her blood pressure is a little bit elevated;, her balance issues seem to be worsening. I've asked that she hold her Lopressor for  a week or 2 and then let us know. Also asked her to follow-up with [name] about her balance issues which are progressively compromising.  I've instructed her on exercises for her shoulder. She is to let us know in 2 weeks if she is made significant improvement. In the event that she has not, we will refer her for physical therapy

## 2015-03-18 ENCOUNTER — Encounter: Payer: BLUE CROSS/BLUE SHIELD | Admitting: Internal Medicine

## 2015-03-26 ENCOUNTER — Encounter: Payer: Self-pay | Admitting: Internal Medicine

## 2015-04-01 ENCOUNTER — Encounter: Payer: Self-pay | Admitting: Neurology

## 2015-04-01 ENCOUNTER — Encounter: Payer: Self-pay | Admitting: Internal Medicine

## 2015-04-01 ENCOUNTER — Telehealth: Payer: Self-pay | Admitting: Neurology

## 2015-04-01 NOTE — Telephone Encounter (Signed)
The patient is calling to advise of BP readings. BP readings over a 2 week period were 147/78 and 122/83.

## 2015-04-02 ENCOUNTER — Encounter: Payer: Self-pay | Admitting: Internal Medicine

## 2015-04-02 ENCOUNTER — Other Ambulatory Visit: Payer: Self-pay

## 2015-04-02 MED ORDER — LAMOTRIGINE 100 MG PO TABS: ORAL_TABLET | ORAL | Status: DC

## 2015-04-02 NOTE — Telephone Encounter (Signed)
I called the patient. She actually meant to send the BPs to her cardiologist. I advised that I received her message on recent immunizations from [place] and will update it in the system. She thanked me for calling.

## 2015-04-02 NOTE — Telephone Encounter (Signed)
Please see other phone note from 04/02/15.

## 2015-04-04 NOTE — Telephone Encounter (Signed)
Error

## 2015-04-09 ENCOUNTER — Telehealth: Payer: Self-pay | Admitting: Neurology

## 2015-04-09 DIAGNOSIS — Z5181 Encounter for therapeutic drug level monitoring: Secondary | ICD-10-CM

## 2015-04-09 DIAGNOSIS — R413 Other amnesia: Secondary | ICD-10-CM

## 2015-04-09 NOTE — Telephone Encounter (Signed)
Patient called to advise that since mid-late July she has been "having trouble pronouncing words and sometimes when she bends over to pick something up off the floor she almost falls over". Patient didn't notice this about speech until her family noticed it and said something to her. Sister says her memory is off too.

## 2015-04-09 NOTE — Telephone Encounter (Signed)
I called the patient. The patient is reporting some mild trouble with spelling, word finding. No severe issues with memory per se. She has had some problems for a year, more noticeable over the last 2 months. I'll have her come in for blood work looking for metabolic causes of the cognitive issues. She will be seen in January 2017.

## 2015-04-09 NOTE — Telephone Encounter (Signed)
I called the patient. She stated for the past year she has had trouble reading a word and then pronouncing it. She has also had trouble spelling. She states she used to be a good speller, but she cannot remember how to spell words anymore. She feels like her memory is bad. She also c/o not being able to bend over without feeling like she is going to fall over. She states this has been going on for about a month. She states none of these instances are related to any seizures.

## 2015-04-11 ENCOUNTER — Other Ambulatory Visit (INDEPENDENT_AMBULATORY_CARE_PROVIDER_SITE_OTHER): Payer: Self-pay

## 2015-04-11 DIAGNOSIS — R413 Other amnesia: Secondary | ICD-10-CM

## 2015-04-11 DIAGNOSIS — Z5181 Encounter for therapeutic drug level monitoring: Secondary | ICD-10-CM

## 2015-04-11 DIAGNOSIS — Z0289 Encounter for other administrative examinations: Secondary | ICD-10-CM

## 2015-04-15 ENCOUNTER — Telehealth: Payer: Self-pay | Admitting: *Deleted

## 2015-04-15 LAB — COMPREHENSIVE METABOLIC PANEL
A/G RATIO: 2 (ref 1.1–2.5)
ALT: 15 IU/L (ref 0–32)
AST: 15 IU/L (ref 0–40)
Albumin: 4.2 g/dL (ref 3.5–5.5)
Alkaline Phosphatase: 147 IU/L — ABNORMAL HIGH (ref 39–117)
BUN / CREAT RATIO: 9 (ref 9–23)
BUN: 8 mg/dL (ref 6–24)
Bilirubin Total: 0.3 mg/dL (ref 0.0–1.2)
CALCIUM: 9.4 mg/dL (ref 8.7–10.2)
CHLORIDE: 104 mmol/L (ref 97–106)
CO2: 26 mmol/L (ref 18–29)
Creatinine, Ser: 0.86 mg/dL (ref 0.57–1.00)
GFR calc non Af Amer: 76 mL/min/{1.73_m2} (ref >59)
GFR, EST AFRICAN AMERICAN: 88 mL/min/{1.73_m2} (ref >59)
GLOBULIN, TOTAL: 2.1 g/dL (ref 1.5–4.5)
Glucose: 82 mg/dL (ref 65–99)
Potassium: 5.1 mmol/L (ref 3.5–5.2)
SODIUM: 143 mmol/L (ref 136–144)
TOTAL PROTEIN: 6.3 g/dL (ref 6.0–8.5)

## 2015-04-15 LAB — VITAMIN B12: Vitamin B-12: 177 pg/mL — ABNORMAL LOW (ref 211–946)

## 2015-04-15 LAB — CBC WITH DIFFERENTIAL/PLATELET
BASOS ABS: 0 10*3/uL (ref 0.0–0.2)
Basos: 0 %
EOS (ABSOLUTE): 0.1 10*3/uL (ref 0.0–0.4)
Eos: 1 %
HEMOGLOBIN: 14.4 g/dL (ref 11.1–15.9)
Hematocrit: 43 % (ref 34.0–46.6)
IMMATURE GRANS (ABS): 0 10*3/uL (ref 0.0–0.1)
Immature Granulocytes: 0 %
LYMPHS: 41 %
Lymphocytes Absolute: 3.9 10*3/uL — ABNORMAL HIGH (ref 0.7–3.1)
MCH: 31.9 pg (ref 26.6–33.0)
MCHC: 33.5 g/dL (ref 31.5–35.7)
MCV: 95 fL (ref 79–97)
MONOCYTES: 8 %
Monocytes Absolute: 0.8 10*3/uL (ref 0.1–0.9)
Neutrophils Absolute: 4.8 10*3/uL (ref 1.4–7.0)
Neutrophils: 50 %
Platelets: 276 10*3/uL (ref 150–379)
RBC: 4.52 x10E6/uL (ref 3.77–5.28)
RDW: 14.2 % (ref 12.3–15.4)
WBC: 9.6 10*3/uL (ref 3.4–10.8)

## 2015-04-15 LAB — LAMOTRIGINE LEVEL: LAMOTRIGINE LVL: 7.7 ug/mL (ref 2.0–20.0)

## 2015-04-15 LAB — 10-HYDROXYCARBAZEPINE: OXCARBAZEPINE SERPL-MCNC: 24 ug/mL (ref 10–35)

## 2015-04-15 NOTE — Telephone Encounter (Signed)
Received direct call transferred from operator from Bethena Roys at [facility].  Pt is currently in dental chair and Bethena Roys is requesting clearance for pt to have procedure today.  Pt needs 5 fillings, 2 crowns and extractions.  I asked if procedures were all planned for today.  Bethena Roys reports they are planning to do a procedure today to fix pt's front teeth but at this point it is not known what pt will need done.  Bethena Roys will determine what procedure is going to be done and call us back with this information.

## 2015-04-24 ENCOUNTER — Encounter: Payer: Self-pay | Admitting: Neurology

## 2015-05-12 ENCOUNTER — Other Ambulatory Visit: Payer: Self-pay | Admitting: Internal Medicine

## 2015-05-13 NOTE — Telephone Encounter (Signed)
At last office visit, patient was instructed to hold this medication and monitor blood pressure. Should she be back on it? Please advise. Thanks, MI

## 2015-05-14 NOTE — Telephone Encounter (Signed)
Can you please call her and see if she is back on it. This was recommended as a trial off of it, but she may be back on it. If she is that's ok- go ahead and refill.  Thank you! Alvis Lemmings, RN, BSN

## 2015-05-14 NOTE — Telephone Encounter (Signed)
Spoke with patient and she stated that she is not taking this medication at this time.

## 2015-06-04 ENCOUNTER — Encounter: Payer: Self-pay | Admitting: Neurology

## 2015-06-11 ENCOUNTER — Telehealth: Payer: Self-pay | Admitting: Cardiology

## 2015-06-11 ENCOUNTER — Telehealth: Payer: Self-pay | Admitting: Internal Medicine

## 2015-06-11 ENCOUNTER — Ambulatory Visit (INDEPENDENT_AMBULATORY_CARE_PROVIDER_SITE_OTHER): Payer: BLUE CROSS/BLUE SHIELD | Admitting: *Deleted

## 2015-06-11 DIAGNOSIS — I442 Atrioventricular block, complete: Secondary | ICD-10-CM

## 2015-06-11 NOTE — Telephone Encounter (Signed)
Spoke with pt and reminded pt of remote transmission that is due today. Pt verbalized understanding.   

## 2015-06-12 NOTE — Progress Notes (Signed)
Remote pacemaker transmission.   

## 2015-06-18 LAB — CUP PACEART REMOTE DEVICE CHECK
Battery Remaining Longevity: 135 mo
Brady Statistic AP VP Percent: 0.72 %
Brady Statistic AP VS Percent: 0.07 %
Brady Statistic RA Percent Paced: 0.79 %
Brady Statistic RV Percent Paced: 18.36 %
Date Time Interrogation Session: 20170104001502
Implantable Lead Implant Date: 20160707
Lead Channel Impedance Value: 760 Ohm
Lead Channel Impedance Value: 855 Ohm
Lead Channel Sensing Intrinsic Amplitude: 23.375 mV
Lead Channel Sensing Intrinsic Amplitude: 5.125 mV
Lead Channel Setting Pacing Amplitude: 1.5 V
Lead Channel Setting Pacing Amplitude: 2.5 V
Lead Channel Setting Sensing Sensitivity: 4 mV
MDC IDC LEAD IMPLANT DT: 20160707
MDC IDC LEAD LOCATION: 753859
MDC IDC LEAD LOCATION: 753860
MDC IDC MSMT BATTERY VOLTAGE: 3.04 V
MDC IDC MSMT LEADCHNL RA IMPEDANCE VALUE: 380 Ohm
MDC IDC MSMT LEADCHNL RA IMPEDANCE VALUE: 456 Ohm
MDC IDC MSMT LEADCHNL RA PACING THRESHOLD AMPLITUDE: 0.625 V
MDC IDC MSMT LEADCHNL RA PACING THRESHOLD PULSEWIDTH: 0.4 ms
MDC IDC MSMT LEADCHNL RA SENSING INTR AMPL: 5.125 mV
MDC IDC MSMT LEADCHNL RV PACING THRESHOLD AMPLITUDE: 0.5 V
MDC IDC MSMT LEADCHNL RV PACING THRESHOLD PULSEWIDTH: 0.4 ms
MDC IDC MSMT LEADCHNL RV SENSING INTR AMPL: 23.375 mV
MDC IDC SET LEADCHNL RV PACING PULSEWIDTH: 0.4 ms
MDC IDC STAT BRADY AS VP PERCENT: 17.64 %
MDC IDC STAT BRADY AS VS PERCENT: 81.58 %

## 2015-06-19 ENCOUNTER — Encounter: Payer: Self-pay | Admitting: Cardiology

## 2015-07-09 ENCOUNTER — Ambulatory Visit (INDEPENDENT_AMBULATORY_CARE_PROVIDER_SITE_OTHER): Payer: BLUE CROSS/BLUE SHIELD | Admitting: Adult Health

## 2015-07-09 ENCOUNTER — Encounter: Payer: Self-pay | Admitting: Adult Health

## 2015-07-09 VITALS — BP 145/81 | HR 70 | Ht 62.0 in | Wt 179.5 lb

## 2015-07-09 DIAGNOSIS — R569 Unspecified convulsions: Secondary | ICD-10-CM | POA: Diagnosis not present

## 2015-07-09 DIAGNOSIS — E538 Deficiency of other specified B group vitamins: Secondary | ICD-10-CM | POA: Diagnosis not present

## 2015-07-09 MED ORDER — VITAMIN B-12 1000 MCG PO TABS: 1000.0000 ug | ORAL_TABLET | Freq: Every day | ORAL | Status: DC

## 2015-07-09 NOTE — Progress Notes (Signed)
I have read the note, and I agree with the clinical assessment and plan.  [name] [name],[name] [name]   

## 2015-07-09 NOTE — Patient Instructions (Addendum)
Continue Trileptal and Lamtical Begin Vitamin B12 1000 mcg daily  If you have any seizure events please let us know

## 2015-07-09 NOTE — Progress Notes (Signed)
PATIENT: [name] [name] DOB: 13-Aug-1960  REASON FOR VISIT: follow up- seizure events HISTORY FROM: patient  HISTORY OF PRESENT ILLNESS: [name] [name] is a 55 year old female with a history of seizure events. She returns today for follow-up. She reports that since the last visit she is only had 2 occasions where she felt like she may have a seizure but this did not happen. She states the first time this occurred was after her medication was lowered. The patient states that she was placed on trazodone for insomnia however she states that that increased her seizure events. She is no longer on this medication. The patient also is on CPAP however she states that she has not been using his because the last 4 seizures occurred while she had the mask on and she felt as if she would suffocate. The patient notices some changes with her memory. She states that occasionally she will forget words during conversation or people's names. [name] check blood work which was unremarkable with the exception of a low vitamin B-12. The patient is not on B12 supplementation. She returns today for an evaluation.  HISTORY 03/07/15 (WILLIS): [name] [name] is a 55 year old left-handed white female with a history of seizure-type events. The patient recently was in the hospital in early July 2016, she was found to have had a typical seizure-type event, but she was noted to be in complete heart block with the episode. The patient required a cardiac pacemaker placement, she indicates that she has not had any overt seizure since this has been placed. The patient will have episodes where she feels that she might have a seizure, but she never goes into an episode. The patient is on Trileptal and Lamictal. The patient is not operating a motor vehicle. She continues to have chronic issues with insomnia. This has been a problem for her since she was in her 27s. She returns to the office today for an evaluation.   REVIEW OF SYSTEMS:  Out of a complete 14 system review of symptoms, the patient complains only of the following symptoms, and all other reviewed systems are negative.  Insomnia, daytime sleepiness, speech difficulty, headache, memory loss  ALLERGIES: Allergies  Allergen Reactions  . Zonisamide Anxiety    Panic Attacks  . Aspirin     sensitivity  . Ciprofloxacin     Seizures & it keeps her awake  . Codeine Hives  . Hydrocodone Hives  . Oxycodone Hives  . Penicillins Hives    HOME MEDICATIONS: Outpatient Prescriptions Prior to Visit  Medication Sig Dispense Refill  . ibuprofen (ADVIL,MOTRIN) 200 MG tablet Take 200 mg by mouth every 6 (six) hours as needed for mild pain or moderate pain. Liquid-Gels    . lamoTRIgine (LAMICTAL) 100 MG tablet One tablet in the morning and 2 in the evening 270 tablet 0  . omeprazole (PRILOSEC) 20 MG capsule Take 20 mg by mouth daily.    Marland Kitchen oxcarbazepine (TRILEPTAL) 600 MG tablet Take 1 tablet (600 mg total) by mouth 2 (two) times daily. 180 tablet 1  . metoprolol tartrate (LOPRESSOR) 25 MG tablet Take 1 tablet (25 mg total) by mouth 2 (two) times daily. 180 tablet 0  . traZODone (DESYREL) 150 MG tablet Take 1 tablet (150 mg total) by mouth at bedtime. 30 tablet 1   No facility-administered medications prior to visit.    PAST MEDICAL HISTORY: Past Medical History  Diagnosis Date  . Seizures ([place])   . Diverticulitis   . GERD (gastroesophageal  reflux disease)   . Hiatal hernia   . Wrist tendonitis     R   . Panic disorder 09/07/2014  . OSA on CPAP   . LBBB (left bundle branch block)   . Chronic insomnia 03/07/2015    PAST SURGICAL HISTORY: Past Surgical History  Procedure Laterality Date  . Tubal ligation    . Hand surgery    . Ep implantable device N/A 12/13/2014    Procedure: Pacemaker Implant;  Surgeon: Deboraha Sprang, MD;  Location: [facility];  Service: Cardiovascular;  Laterality: N/A;    FAMILY HISTORY: Family History  Problem Relation Age of  Onset  . Kidney failure Sister   . Diabetes Sister   . Alzheimer's disease Mother   . Hypertension Mother   . Hypertension Father   . Stroke Father   . Coronary artery disease Maternal Grandfather   . Parkinson's disease Maternal Grandmother   . Stroke Brother   . Hypertension Brother   . Sleep apnea Brother     SOCIAL HISTORY: Social History   Social History  . Marital Status: Single    Spouse Name: N/A  . Number of Children: 0  . Years of Education: 14   Occupational History  . Not on file.   Social History Main Topics  . Smoking status: Current Every Day Smoker -- 0.50 packs/day for 35 years    Types: Cigarettes  . Smokeless tobacco: Never Used  . Alcohol Use: No     Comment: Quit 12/06/2009  . Drug Use: No  . Sexual Activity: Not on file   Other Topics Concern  . Not on file   Social History Narrative   Patient drinks 24 oz of soda daily.   Lives with sister   Some college   Left handed         PHYSICAL EXAM  Filed Vitals:   07/09/15 0911  BP: 145/81  Pulse: 70  Height: 5\' 2"  (1.575 m)  Weight: 179 lb 8 oz (81.421 kg)   Body mass index is 32.82 kg/(m^2).  Generalized: Well developed, in no acute distress   Neurological examination  Mentation: Alert oriented to time, place, history taking. Follows all commands speech and language fluent Cranial nerve II-XII: Pupils were equal round reactive to light. Extraocular movements were full, visual field were full on confrontational test. Facial sensation and strength were normal. Uvula tongue midline. Head turning and shoulder shrug  were normal and symmetric. Motor: The motor testing reveals 5 over 5 strength of all 4 extremities. Good symmetric motor tone is noted throughout.  Sensory: Sensory testing is intact to soft touch on all 4 extremities. No evidence of extinction is noted.  Coordination: Cerebellar testing reveals good finger-nose-finger and heel-to-shin bilaterally.  Gait and station: Gait is  normal. Tandem gait is normal. Romberg is negative. No drift is seen.  Reflexes: Deep tendon reflexes are symmetric and normal bilaterally.   DIAGNOSTIC DATA (LABS, IMAGING, TESTING) - I reviewed patient records, labs, notes, testing and imaging myself where available.  Lab Results  Component Value Date   WBC 9.6 04/11/2015   HGB 13.8 12/14/2014   HCT 43.0 04/11/2015   MCV 95 04/11/2015   PLT 276 04/11/2015      Component Value Date/Time   NA 143 04/11/2015 1008   NA 141 12/17/2014 0353   K 5.1 04/11/2015 1008   CL 104 04/11/2015 1008   CO2 26 04/11/2015 1008   GLUCOSE 82 04/11/2015 1008   GLUCOSE  110* 12/17/2014 0353   BUN 8 04/11/2015 1008   BUN 9 12/17/2014 0353   CREATININE 0.86 04/11/2015 1008   CALCIUM 9.4 04/11/2015 1008   PROT 6.3 04/11/2015 1008   PROT 6.4* 12/14/2014 0406   ALBUMIN 4.2 04/11/2015 1008   ALBUMIN 3.2* 12/14/2014 0406   AST 15 04/11/2015 1008   ALT 15 04/11/2015 1008   ALKPHOS 147* 04/11/2015 1008   BILITOT 0.3 04/11/2015 1008   BILITOT 0.9 12/14/2014 0406   GFRNONAA 76 04/11/2015 1008   GFRAA 88 04/11/2015 1008   Lab Results  Component Value Date   TRIG 166* 12/15/2014    Lab Results  Component Value Date   VITAMINB12 177* 04/11/2015   Lab Results  Component Value Date   TSH 1.399 12/12/2014      ASSESSMENT AND PLAN 55 y.o. year old female  has a past medical history of Seizures ([place]); Diverticulitis; GERD (gastroesophageal reflux disease); Hiatal hernia; Wrist tendonitis; Panic disorder (09/07/2014); OSA on CPAP; LBBB (left bundle branch block); and Chronic insomnia (03/07/2015). here with:  1. Seizures 2. Vitamin B12 deficiency  The patient will continue on Lamictal and Trileptal. The patient had recent blood work which showed normal levels. Her blood work did indicate a low vitamin B12. I will start the patient on vitamin B12 oral supplementation. She will take 1000 g daily. Patient advised that she has any additional seizure  events she should let us know. She will follow-up in 3 months or sooner if needed.     Ward Givens, MSN, NP-C 07/09/2015, 10:29 AM [facility] [address], [place],  [address] [phone]

## 2015-07-10 ENCOUNTER — Other Ambulatory Visit: Payer: Self-pay | Admitting: Neurology

## 2015-08-12 ENCOUNTER — Other Ambulatory Visit: Payer: Self-pay | Admitting: Neurology

## 2015-08-12 MED ORDER — LAMOTRIGINE 100 MG PO TABS: ORAL_TABLET | ORAL | Status: DC

## 2015-08-12 NOTE — Addendum Note (Signed)
Addended by: Laurence Spates on: 08/12/2015 09:26 AM   Modules accepted: Orders

## 2015-08-26 ENCOUNTER — Other Ambulatory Visit: Payer: Self-pay | Admitting: Neurology

## 2015-08-26 ENCOUNTER — Encounter: Payer: Self-pay | Admitting: Neurology

## 2015-08-26 MED ORDER — OXCARBAZEPINE 600 MG PO TABS: 600.0000 mg | ORAL_TABLET | Freq: Two times a day (BID) | ORAL | Status: DC

## 2015-09-10 ENCOUNTER — Ambulatory Visit (INDEPENDENT_AMBULATORY_CARE_PROVIDER_SITE_OTHER): Payer: BLUE CROSS/BLUE SHIELD | Admitting: *Deleted

## 2015-09-10 DIAGNOSIS — I442 Atrioventricular block, complete: Secondary | ICD-10-CM | POA: Diagnosis not present

## 2015-09-10 NOTE — Progress Notes (Signed)
Remote pacemaker transmission.   

## 2015-09-16 ENCOUNTER — Encounter: Payer: Self-pay | Admitting: Adult Health

## 2015-10-07 ENCOUNTER — Encounter: Payer: Self-pay | Admitting: Adult Health

## 2015-10-07 ENCOUNTER — Ambulatory Visit (INDEPENDENT_AMBULATORY_CARE_PROVIDER_SITE_OTHER): Payer: Self-pay | Admitting: Adult Health

## 2015-10-07 VITALS — BP 133/80 | HR 80 | Ht 62.0 in | Wt 182.2 lb

## 2015-10-07 DIAGNOSIS — R569 Unspecified convulsions: Secondary | ICD-10-CM

## 2015-10-07 DIAGNOSIS — R519 Headache, unspecified: Secondary | ICD-10-CM

## 2015-10-07 DIAGNOSIS — Z5181 Encounter for therapeutic drug level monitoring: Secondary | ICD-10-CM

## 2015-10-07 DIAGNOSIS — R51 Headache: Secondary | ICD-10-CM

## 2015-10-07 MED ORDER — OXCARBAZEPINE 600 MG PO TABS: 600.0000 mg | ORAL_TABLET | Freq: Two times a day (BID) | ORAL | Status: DC

## 2015-10-07 MED ORDER — LAMOTRIGINE 100 MG PO TABS: 200.0000 mg | ORAL_TABLET | Freq: Two times a day (BID) | ORAL | Status: DC

## 2015-10-07 NOTE — Progress Notes (Signed)
PATIENT: [name] [name] DOB: August 14, 1960  REASON FOR VISIT: follow up- seizures HISTORY FROM: patient  HISTORY OF PRESENT ILLNESS: [name] [name] is a 55 year old female with a history of seizures. She returns today for follow-up. She is currently  taking Lamictal 100 mg 1 tablet morning and 2 tablets in the evening. She is also on Trileptal 600 mg twice a day. She reports that she is tolerating this well. She states that she's had 2 episodes that she feels may have been seizures. She states that she laid down to go to sleep and felt like her arms were jumping she states that this lasted for several minutes and once it was over she was very tired and sleepy. She notes that she did not bite her tongue, denies any loss of bladder or bowels. She states in the past she has not lost consciousness with some of her seizures. She is not operating a motor vehicle. She reports that she is still having trouble staying awake during the day. She states that her sleepiness normally starts around 12 PM. She is not working currently. She states that her dizziness has almost resolved. She states occasionally she'll get dizzy with turns. Denies any changes with her balance. She reports that she does have some discomfort on the left side of the head above the ear. She describes this discomfort as a sharp shooting pain that lasts for several seconds. This normally occurs at least every other day. She states that putting pressure or rubbing the area normally improves the symptoms. She states that this pain can occur above the ear or in the occipital region on the left side. In the past she has been on CPAP however after having 3 seizures while using the CPAP she decided that she does not feel comfortable using the machine any longer. She has tried Belsomra as well as melatonin to help with her sleep. She did not feel that these medications were beneficial. She returns today for an evaluation.  HISTORY 07/09/15: [name] [name]  is a 55 year old female with a history of seizure events. She returns today for follow-up. She reports that since the last visit she is only had 2 occasions where she felt like she may have a seizure but this did not happen. She states the first time this occurred was after her medication was lowered. The patient states that she was placed on trazodone for insomnia however she states that that increased her seizure events. She is no longer on this medication. The patient also is on CPAP however she states that she has not been using his because the last 4 seizures occurred while she had the mask on and she felt as if she would suffocate. The patient notices some changes with her memory. She states that occasionally she will forget words during conversation or people's names. [name] check blood work which was unremarkable with the exception of a low vitamin B-12. The patient is not on B12 supplementation. She returns today for an evaluation.  HISTORY 03/07/15 ([name]): [name] [name] is a 55 year old left-handed white female with a history of seizure-type events. The patient recently was in the hospital in early July 2016, she was found to have had a typical seizure-type event, but she was noted to be in complete heart block with the episode. The patient required a cardiac pacemaker placement, she indicates that she has not had any overt seizure since this has been placed. The patient will have episodes where she feels that she  might have a seizure, but she never goes into an episode. The patient is on Trileptal and Lamictal. The patient is not operating a motor vehicle. She continues to have chronic issues with insomnia. This has been a problem for her since she was in her 41s. She returns to the office today for an evaluation.    REVIEW OF SYSTEMS: Out of a complete 14 system review of symptoms, the patient complains only of the following symptoms, and all other reviewed systems are negative.  Insomnia,  seizure  ALLERGIES: Allergies  Allergen Reactions  . Zonisamide Anxiety    Panic Attacks  . Aspirin     sensitivity  . Ciprofloxacin     Seizures & it keeps her awake  . Codeine Hives  . Hydrocodone Hives  . Oxycodone Hives  . Penicillins Hives    HOME MEDICATIONS: Outpatient Prescriptions Prior to Visit  Medication Sig Dispense Refill  . ibuprofen (ADVIL,MOTRIN) 200 MG tablet Take 200 mg by mouth every 6 (six) hours as needed for mild pain or moderate pain. Liquid-Gels    . omeprazole (PRILOSEC) 20 MG capsule Take 20 mg by mouth daily.    . vitamin B-12 (CYANOCOBALAMIN) 1000 MCG tablet Take 1 tablet (1,000 mcg total) by mouth daily. 30 tablet 5  . lamoTRIgine (LAMICTAL) 100 MG tablet Take 1 tab in the morning and 2 tab in the evening 270 tablet 3  . oxcarbazepine (TRILEPTAL) 600 MG tablet Take 1 tablet (600 mg total) by mouth 2 (two) times daily. 180 tablet 1   No facility-administered medications prior to visit.    PAST MEDICAL HISTORY: Past Medical History  Diagnosis Date  . Seizures ([place])   . Diverticulitis   . GERD (gastroesophageal reflux disease)   . Hiatal hernia   . Wrist tendonitis     R   . Panic disorder 09/07/2014  . OSA on CPAP   . LBBB (left bundle branch block)   . Chronic insomnia 03/07/2015    PAST SURGICAL HISTORY: Past Surgical History  Procedure Laterality Date  . Tubal ligation    . Hand surgery    . Ep implantable device N/A 12/13/2014    Procedure: Pacemaker Implant;  Surgeon: Deboraha Sprang, MD;  Location: [facility];  Service: Cardiovascular;  Laterality: N/A;    FAMILY HISTORY: Family History  Problem Relation Age of Onset  . Kidney failure Sister   . Diabetes Sister   . Alzheimer's disease Mother   . Hypertension Mother   . Hypertension Father   . Stroke Father   . Coronary artery disease Maternal Grandfather   . Parkinson's disease Maternal Grandmother   . Stroke Brother   . Hypertension Brother   . Sleep apnea Brother      SOCIAL HISTORY: Social History   Social History  . Marital Status: Single    Spouse Name: N/A  . Number of Children: 0  . Years of Education: 14   Occupational History  . Not on file.   Social History Main Topics  . Smoking status: Current Every Day Smoker -- 0.50 packs/day for 35 years    Types: Cigarettes  . Smokeless tobacco: Never Used  . Alcohol Use: No     Comment: Quit 12/06/2009  . Drug Use: No  . Sexual Activity: Not on file   Other Topics Concern  . Not on file   Social History Narrative   Patient drinks 24 oz of soda daily.   Lives with sister  Some college   Left handed         PHYSICAL EXAM  Filed Vitals:   10/07/15 0758  BP: 133/80  Pulse: 80  Height: 5\' 2"  (1.575 m)  Weight: 182 lb 3.2 oz (82.645 kg)   Body mass index is 33.32 kg/(m^2).  Generalized: Well developed, in no acute distress   Neurological examination  Mentation: Alert oriented to time, place, history taking. Follows all commands speech and language fluent Cranial nerve II-XII: Pupils were equal round reactive to light. Extraocular movements were full, visual field were full on confrontational test. Facial sensation and strength were normal. Uvula tongue midline. Head turning and shoulder shrug  were normal and symmetric. Motor: The motor testing reveals 5 over 5 strength of all 4 extremities. Good symmetric motor tone is noted throughout.  Sensory: Sensory testing is intact to soft touch on all 4 extremities. No evidence of extinction is noted.  Coordination: Cerebellar testing reveals good finger-nose-finger and heel-to-shin bilaterally.  Gait and station: Gait is normal. Tandem gait is normal. Romberg is negative. No drift is seen.  Reflexes: Deep tendon reflexes are symmetric and normal bilaterally.   DIAGNOSTIC DATA (LABS, IMAGING, TESTING) - I reviewed patient records, labs, notes, testing and imaging myself where available.  Lab Results  Component Value Date   WBC  9.6 04/11/2015   HGB 13.8 12/14/2014   HCT 43.0 04/11/2015   MCV 95 04/11/2015   PLT 276 04/11/2015      Component Value Date/Time   NA 143 04/11/2015 1008   NA 141 12/17/2014 0353   K 5.1 04/11/2015 1008   CL 104 04/11/2015 1008   CO2 26 04/11/2015 1008   GLUCOSE 82 04/11/2015 1008   GLUCOSE 110* 12/17/2014 0353   BUN 8 04/11/2015 1008   BUN 9 12/17/2014 0353   CREATININE 0.86 04/11/2015 1008   CALCIUM 9.4 04/11/2015 1008   PROT 6.3 04/11/2015 1008   PROT 6.4* 12/14/2014 0406   ALBUMIN 4.2 04/11/2015 1008   ALBUMIN 3.2* 12/14/2014 0406   AST 15 04/11/2015 1008   ALT 15 04/11/2015 1008   ALKPHOS 147* 04/11/2015 1008   BILITOT 0.3 04/11/2015 1008   BILITOT 0.9 12/14/2014 0406   GFRNONAA 76 04/11/2015 1008   GFRAA 88 04/11/2015 1008   Lab Results  Component Value Date   TRIG 166* 12/15/2014   No results found for: HGBA1C Lab Results  Component Value Date   VITAMINB12 177* 04/11/2015   Lab Results  Component Value Date   TSH 1.399 12/12/2014      ASSESSMENT AND PLAN 55 y.o. year old female  has a past medical history of Seizures ([place]); Diverticulitis; GERD (gastroesophageal reflux disease); Hiatal hernia; Wrist tendonitis; Panic disorder (09/07/2014); OSA on CPAP; LBBB (left bundle branch block); and Chronic insomnia (03/07/2015). here with:  1. Seizures 2. Headaches    The patient has had 2 events since last visit that may represent seizure events. We will increase Lamictal to 200 mg twice a day. She will remain on Trileptal 600 mg twice a day. I will check blood work today. The patient should not operate a motor vehicle. Patient advised that if she has any additional seizure event she should let us know.Increasing the Lamictal may also improve the sharp shooting pains on the left side of the head. She will follow-up in 3 months or sooner if needed.     Ward Givens, MSN, NP-C 10/07/2015, 8:44 AM [facility] [address], [place], [place]  [address] [phone]    

## 2015-10-07 NOTE — Progress Notes (Signed)
I have read the note, and I agree with the clinical assessment and plan.  WILLIS,CHARLES KEITH   

## 2015-10-07 NOTE — Patient Instructions (Signed)
Increase Lamictal to 200 mg twice a day Continue trileptal 600 mg twice a day No driving  If your symptoms worsen or you develop new symptoms please let us know.

## 2015-10-08 ENCOUNTER — Other Ambulatory Visit: Payer: Self-pay | Admitting: Adult Health

## 2015-10-08 MED ORDER — LAMOTRIGINE 100 MG PO TABS: 150.0000 mg | ORAL_TABLET | Freq: Two times a day (BID) | ORAL | Status: DC

## 2015-10-09 LAB — COMPREHENSIVE METABOLIC PANEL
ALBUMIN: 4.4 g/dL (ref 3.5–5.5)
ALT: 17 IU/L (ref 0–32)
AST: 16 IU/L (ref 0–40)
Albumin/Globulin Ratio: 1.7 (ref 1.2–2.2)
Alkaline Phosphatase: 145 IU/L — ABNORMAL HIGH (ref 39–117)
BUN / CREAT RATIO: 8 — AB (ref 9–23)
BUN: 7 mg/dL (ref 6–24)
Bilirubin Total: 0.4 mg/dL (ref 0.0–1.2)
CALCIUM: 9.7 mg/dL (ref 8.7–10.2)
CHLORIDE: 96 mmol/L (ref 96–106)
CO2: 22 mmol/L (ref 18–29)
CREATININE: 0.92 mg/dL (ref 0.57–1.00)
GFR, EST AFRICAN AMERICAN: 80 mL/min/{1.73_m2} (ref >59)
GFR, EST NON AFRICAN AMERICAN: 70 mL/min/{1.73_m2} (ref >59)
GLUCOSE: 94 mg/dL (ref 65–99)
Globulin, Total: 2.6 g/dL (ref 1.5–4.5)
Potassium: 5 mmol/L (ref 3.5–5.2)
Sodium: 138 mmol/L (ref 134–144)
TOTAL PROTEIN: 7 g/dL (ref 6.0–8.5)

## 2015-10-09 LAB — CBC WITH DIFFERENTIAL/PLATELET
BASOS ABS: 0 10*3/uL (ref 0.0–0.2)
BASOS: 0 %
EOS (ABSOLUTE): 0.1 10*3/uL (ref 0.0–0.4)
EOS: 1 %
HEMATOCRIT: 44.3 % (ref 34.0–46.6)
HEMOGLOBIN: 14.3 g/dL (ref 11.1–15.9)
IMMATURE GRANS (ABS): 0 10*3/uL (ref 0.0–0.1)
Immature Granulocytes: 0 %
LYMPHS: 42 %
Lymphocytes Absolute: 4 10*3/uL — ABNORMAL HIGH (ref 0.7–3.1)
MCH: 30.5 pg (ref 26.6–33.0)
MCHC: 32.3 g/dL (ref 31.5–35.7)
MCV: 95 fL (ref 79–97)
MONOCYTES: 6 %
Monocytes Absolute: 0.6 10*3/uL (ref 0.1–0.9)
NEUTROS ABS: 4.9 10*3/uL (ref 1.4–7.0)
Neutrophils: 51 %
Platelets: 272 10*3/uL (ref 150–379)
RBC: 4.69 x10E6/uL (ref 3.77–5.28)
RDW: 13.7 % (ref 12.3–15.4)
WBC: 9.6 10*3/uL (ref 3.4–10.8)

## 2015-10-09 LAB — LAMOTRIGINE LEVEL: Lamotrigine Lvl: 5.6 ug/mL (ref 2.0–20.0)

## 2015-10-09 LAB — 10-HYDROXYCARBAZEPINE: OXCARBAZEPINE SERPL-MCNC: 28 ug/mL (ref 10–35)

## 2015-10-10 ENCOUNTER — Telehealth: Payer: Self-pay | Admitting: Adult Health

## 2015-10-10 NOTE — Progress Notes (Signed)
Quick Note:  Called and spoke to patient relayed labs were normal. Patient understood. ______

## 2015-10-10 NOTE — Telephone Encounter (Signed)
Called and spoke to patient relayed labs were normal . Patient understood.  

## 2015-10-10 NOTE — Telephone Encounter (Signed)
-----   Message from Ward Givens, NP sent at 10/09/2015  4:51 PM EDT ----- Lab work is normal. Please call the patient. Thanks.

## 2015-10-23 LAB — CUP PACEART REMOTE DEVICE CHECK
Brady Statistic AP VS Percent: 0.1 %
Brady Statistic AS VP Percent: 25 %
Date Time Interrogation Session: 20170404154358
Implantable Lead Location: 753860
Implantable Lead Model: 5076
Implantable Lead Model: 5076
Lead Channel Pacing Threshold Amplitude: 0.5 V
Lead Channel Pacing Threshold Pulse Width: 0.4 ms
Lead Channel Sensing Intrinsic Amplitude: 22 mV
Lead Channel Sensing Intrinsic Amplitude: 22 mV
Lead Channel Sensing Intrinsic Amplitude: 4.625 mV
Lead Channel Sensing Intrinsic Amplitude: 4.625 mV
Lead Channel Setting Sensing Sensitivity: 4 mV
MDC IDC LEAD IMPLANT DT: 20160707
MDC IDC LEAD IMPLANT DT: 20160707
MDC IDC LEAD LOCATION: 753859
MDC IDC MSMT BATTERY REMAINING LONGEVITY: 125 mo
MDC IDC MSMT BATTERY VOLTAGE: 3.03 V
MDC IDC MSMT LEADCHNL RA IMPEDANCE VALUE: 380 Ohm
MDC IDC MSMT LEADCHNL RA IMPEDANCE VALUE: 456 Ohm
MDC IDC MSMT LEADCHNL RA PACING THRESHOLD AMPLITUDE: 0.625 V
MDC IDC MSMT LEADCHNL RA PACING THRESHOLD PULSEWIDTH: 0.4 ms
MDC IDC MSMT LEADCHNL RV IMPEDANCE VALUE: 760 Ohm
MDC IDC MSMT LEADCHNL RV IMPEDANCE VALUE: 817 Ohm
MDC IDC SET LEADCHNL RA PACING AMPLITUDE: 1.5 V
MDC IDC SET LEADCHNL RV PACING AMPLITUDE: 2.5 V
MDC IDC SET LEADCHNL RV PACING PULSEWIDTH: 0.4 ms
MDC IDC STAT BRADY AP VP PERCENT: 1.38 %
MDC IDC STAT BRADY AS VS PERCENT: 73.53 %
MDC IDC STAT BRADY RA PERCENT PACED: 1.47 %
MDC IDC STAT BRADY RV PERCENT PACED: 26.37 %

## 2015-10-25 ENCOUNTER — Encounter: Payer: Self-pay | Admitting: Cardiology

## 2015-11-13 ENCOUNTER — Telehealth: Payer: Self-pay | Admitting: Neurology

## 2015-11-13 NOTE — Telephone Encounter (Signed)
Patient called to request last 6 months of records be sent to, patient couldn't remember where, will call back.

## 2015-11-13 NOTE — Telephone Encounter (Signed)
Patient called back to request last 6 months of medical records be faxed to Attn: Ann Roberts/[employer] [facility] [employer] [facility] [employer] Fax# [phone], states they need records by 11/20/15. Patient is aware, Debra/[facility] Records is out of the office until tomorrow.

## 2015-11-14 NOTE — Telephone Encounter (Signed)
Pt records faxed to [facility] [employer]. Ples Specter.

## 2015-11-28 ENCOUNTER — Encounter: Payer: Self-pay | Admitting: Adult Health

## 2015-11-28 NOTE — Telephone Encounter (Signed)
I called and spoke to [name] [name], at the [facility].  They do have prescription on file 10-07-15 for the trileptal 600mg  po bid.  He will get ready for pt.   I spoke to pt and let her know this.   She is taking both lamictal 150mg  po bid and the trileptal 600mg  po bid.  She states that the 2 episodes she had (feeling of arms shaking), she feels like are seizures, since she feels the same post ictal lethargy.  MM/NP out of office, will forward to [name] who is her primary MD.

## 2015-12-12 ENCOUNTER — Ambulatory Visit: Payer: BLUE CROSS/BLUE SHIELD

## 2015-12-15 ENCOUNTER — Other Ambulatory Visit: Payer: Self-pay | Admitting: Adult Health

## 2015-12-16 ENCOUNTER — Telehealth: Payer: Self-pay | Admitting: Adult Health

## 2015-12-16 NOTE — Telephone Encounter (Signed)
Patient called to request refill of oxcarbazepine (TRILEPTAL) 600 MG tablet

## 2015-12-16 NOTE — Telephone Encounter (Signed)
I spoke to pharmacy and relayed that 10-07-15 prescription was sent and 3 refills.   He did see this and (stated was stuck somehow in system).  Would get refill for pt.

## 2015-12-19 ENCOUNTER — Ambulatory Visit: Payer: BLUE CROSS/BLUE SHIELD | Attending: Internal Medicine

## 2016-01-07 ENCOUNTER — Encounter: Payer: Self-pay | Admitting: Adult Health

## 2016-01-07 ENCOUNTER — Ambulatory Visit (INDEPENDENT_AMBULATORY_CARE_PROVIDER_SITE_OTHER): Payer: No Typology Code available for payment source | Admitting: Internal Medicine

## 2016-01-07 ENCOUNTER — Ambulatory Visit (INDEPENDENT_AMBULATORY_CARE_PROVIDER_SITE_OTHER): Payer: Self-pay | Admitting: Adult Health

## 2016-01-07 VITALS — BP 133/74 | HR 89 | Ht 62.0 in | Wt 188.0 lb

## 2016-01-07 VITALS — BP 144/82 | HR 96 | Ht 62.0 in | Wt 188.4 lb

## 2016-01-07 DIAGNOSIS — Z5181 Encounter for therapeutic drug level monitoring: Secondary | ICD-10-CM

## 2016-01-07 DIAGNOSIS — Z950 Presence of cardiac pacemaker: Secondary | ICD-10-CM

## 2016-01-07 DIAGNOSIS — I442 Atrioventricular block, complete: Secondary | ICD-10-CM

## 2016-01-07 DIAGNOSIS — R569 Unspecified convulsions: Secondary | ICD-10-CM

## 2016-01-07 DIAGNOSIS — I10 Essential (primary) hypertension: Secondary | ICD-10-CM

## 2016-01-07 LAB — CUP PACEART INCLINIC DEVICE CHECK
Battery Remaining Longevity: 115 mo
Brady Statistic AP VP Percent: 1.06 %
Brady Statistic AS VP Percent: 24.79 %
Brady Statistic AS VS Percent: 74.07 %
Brady Statistic RV Percent Paced: 25.85 %
Date Time Interrogation Session: 20170801163618
Implantable Lead Implant Date: 20160707
Implantable Lead Location: 753859
Implantable Lead Model: 5076
Lead Channel Impedance Value: 437 Ohm
Lead Channel Impedance Value: 760 Ohm
Lead Channel Impedance Value: 779 Ohm
Lead Channel Pacing Threshold Amplitude: 0.5 V
Lead Channel Sensing Intrinsic Amplitude: 25 mV
Lead Channel Sensing Intrinsic Amplitude: 25.875 mV
Lead Channel Sensing Intrinsic Amplitude: 5 mV
Lead Channel Setting Pacing Amplitude: 1.5 V
Lead Channel Setting Pacing Amplitude: 2.5 V
Lead Channel Setting Pacing Pulse Width: 0.4 ms
Lead Channel Setting Sensing Sensitivity: 4 mV
MDC IDC LEAD IMPLANT DT: 20160707
MDC IDC LEAD LOCATION: 753860
MDC IDC MSMT BATTERY VOLTAGE: 3.02 V
MDC IDC MSMT LEADCHNL RA IMPEDANCE VALUE: 380 Ohm
MDC IDC MSMT LEADCHNL RA PACING THRESHOLD AMPLITUDE: 0.5 V
MDC IDC MSMT LEADCHNL RA PACING THRESHOLD PULSEWIDTH: 0.4 ms
MDC IDC MSMT LEADCHNL RA SENSING INTR AMPL: 5.125 mV
MDC IDC MSMT LEADCHNL RV PACING THRESHOLD PULSEWIDTH: 0.4 ms
MDC IDC STAT BRADY AP VS PERCENT: 0.09 %
MDC IDC STAT BRADY RA PERCENT PACED: 1.15 %

## 2016-01-07 MED ORDER — OXCARBAZEPINE 600 MG PO TABS: 600.0000 mg | ORAL_TABLET | Freq: Two times a day (BID) | ORAL | 3 refills | Status: DC

## 2016-01-07 MED ORDER — LAMOTRIGINE 100 MG PO TABS: 150.0000 mg | ORAL_TABLET | Freq: Two times a day (BID) | ORAL | 3 refills | Status: DC

## 2016-01-07 NOTE — Patient Instructions (Signed)
Medication Instructions: - Your physician recommends that you continue on your current medications as directed. Please refer to the Current Medication list given to you today.  Labwork: - none  Procedures/Testing: - none  Follow-Up: - Remote monitoring is used to monitor your Pacemaker of ICD from home. This monitoring reduces the number of office visits required to check your device to one time per year. It allows us to keep an eye on the functioning of your device to ensure it is working properly. You are scheduled for a device check from home on 04/07/16. You may send your transmission at any time that day. If you have a wireless device, the transmission will be sent automatically. After your physician reviews your transmission, you will receive a postcard with your next transmission date.  - Your physician wants you to follow-up in: 1 year with Dr. Klein. You will receive a reminder letter in the mail two months in advance. If you don't receive a letter, please call our office to schedule the follow-up appointment.  Any Additional Special Instructions Will Be Listed Below (If Applicable).     If you need a refill on your cardiac medications before your next appointment, please call your pharmacy.   

## 2016-01-07 NOTE — Progress Notes (Signed)
PATIENT: [name] [name] DOB: [birth year]  REASON FOR VISIT: follow up- seizures HISTORY FROM: patient  HISTORY OF PRESENT ILLNESS: [name] [name] is a 55 year old female with a history of seizures. She returns today for follow-up. She is currently on Lamictal 150 mg twice a day. She is also on Trileptal 600 mg twice a day. She reports tolerating these medications well. Denies any seizure events. She states that she continues to have trouble with her balance. Denies any falls. She states that she typically holds onto a bar when she showers especially when she closes her eyes. Denies any numbness or tingling in the lower extremities. She is currently in the process of trying to obtain long-term disability. She currently lives with her sister and father. She is able to complete all ADLs independently. She does not operate a motor vehicle. She returns today for an evaluation.  HISTORY 10/07/15: [name] [name] is a 55 year old female with a history of seizures. She returns today for follow-up. She is currently  taking Lamictal 100 mg 1 tablet morning and 2 tablets in the evening. She is also on Trileptal 600 mg twice a day. She reports that she is tolerating this well. She states that she's had 2 episodes that she feels may have been seizures. She states that she laid down to go to sleep and felt like her arms were jumping she states that this lasted for several minutes and once it was over she was very tired and sleepy. She notes that she did not bite her tongue, denies any loss of bladder or bowels. She states in the past she has not lost consciousness with some of her seizures. She is not operating a motor vehicle. She reports that she is still having trouble staying awake during the day. She states that her sleepiness normally starts around 12 PM. She is not working currently. She states that her dizziness has almost resolved. She states occasionally she'll get dizzy with turns. Denies any changes with her  balance. She reports that she does have some discomfort on the left side of the head above the ear. She describes this discomfort as a sharp shooting pain that lasts for several seconds. This normally occurs at least every other day. She states that putting pressure or rubbing the area normally improves the symptoms. She states that this pain can occur above the ear or in the occipital region on the left side. In the past she has been on CPAP however after having 3 seizures while using the CPAP she decided that she does not feel comfortable using the machine any longer. She has tried Belsomra as well as melatonin to help with her sleep. She did not feel that these medications were beneficial. She returns today for an evaluation.  HISTORY 07/09/15: [name] [name] is a 56 year old female with a history of seizure events. She returns today for follow-up. She reports that since the last visit she is only had 2 occasions where she felt like she may have a seizure but this did not happen. She states the first time this occurred was after her medication was lowered. The patient states that she was placed on trazodone for insomnia however she states that that increased her seizure events. She is no longer on this medication. The patient also is on CPAP however she states that she has not been using his because the last 4 seizures occurred while she had the mask on and she felt as if she would suffocate. The patient notices  some changes with her memory. She states that occasionally she will forget words during conversation or people's names. [name] check blood work which was unremarkable with the exception of a low vitamin B-12. The patient is not on B12 supplementation. She returns today for an evaluation.  HISTORY 03/07/15 (WILLIS): [name] [name] is a 55 year old left-handed white female with a history of seizure-type events. The patient recently was in the hospital in early July 2016, she was found to have had a  typical seizure-type event, but she was noted to be in complete heart block with the episode. The patient required a cardiac pacemaker placement, she indicates that she has not had any overt seizure since this has been placed. The patient will have episodes where she feels that she might have a seizure, but she never goes into an episode. The patient is on Trileptal and Lamictal. The patient is not operating a motor vehicle. She continues to have chronic issues with insomnia. This has been a problem for her since she was in her 6s. She returns to the office today for an evaluation.     REVIEW OF SYSTEMS: Out of a complete 14 system review of symptoms, the patient complains only of the following symptoms, and all other reviewed systems are negative.  See history of present illness  ALLERGIES: Allergies  Allergen Reactions  . Zonisamide Anxiety    Panic Attacks  . Aspirin     sensitivity  . Ciprofloxacin     Seizures & it keeps her awake  . Codeine Hives  . Hydrocodone Hives  . Oxycodone Hives  . Penicillins Hives    HOME MEDICATIONS: Outpatient Medications Prior to Visit  Medication Sig Dispense Refill  . ibuprofen (ADVIL,MOTRIN) 200 MG tablet Take 200 mg by mouth every 6 (six) hours as needed for mild pain or moderate pain. Liquid-Gels    . lamoTRIgine (LAMICTAL) 100 MG tablet Take 1.5 tablets (150 mg total) by mouth 2 (two) times daily. 270 tablet 3  . Nutritional Supplements (ESTROVEN PO) Take 1 tablet by mouth.    Marland Kitchen omeprazole (PRILOSEC) 20 MG capsule Take 20 mg by mouth daily.    Marland Kitchen oxcarbazepine (TRILEPTAL) 600 MG tablet Take 1 tablet (600 mg total) by mouth 2 (two) times daily. 180 tablet 3  . vitamin B-12 (CYANOCOBALAMIN) 1000 MCG tablet Take 1 tablet (1,000 mcg total) by mouth daily. 30 tablet 5   No facility-administered medications prior to visit.     PAST MEDICAL HISTORY: Past Medical History:  Diagnosis Date  . Chronic insomnia 03/07/2015  . Diverticulitis   .  GERD (gastroesophageal reflux disease)   . Hiatal hernia   . LBBB (left bundle branch block)   . OSA on CPAP   . Panic disorder 09/07/2014  . Seizures ([place])   . Wrist tendonitis    R     PAST SURGICAL HISTORY: Past Surgical History:  Procedure Laterality Date  . EP IMPLANTABLE DEVICE N/A 12/13/2014   Procedure: Pacemaker Implant;  Surgeon: Deboraha Sprang, MD;  Location: [place] [facility];  Service: Cardiovascular;  Laterality: N/A;  . HAND SURGERY    . TUBAL LIGATION      FAMILY HISTORY: Family History  Problem Relation Age of Onset  . Kidney failure Sister   . Diabetes Sister   . Alzheimer's disease Mother   . Hypertension Mother   . Hypertension Father   . Stroke Father   . Coronary artery disease Maternal Grandfather   . Parkinson's disease Maternal  Grandmother   . Stroke Brother   . Hypertension Brother   . Sleep apnea Brother     SOCIAL HISTORY: Social History   Social History  . Marital status: Single    Spouse name: N/A  . Number of children: 0  . Years of education: 14   Occupational History  . Not on file.   Social History Main Topics  . Smoking status: Current Every Day Smoker    Packs/day: 0.25    Years: 35.00    Types: Cigarettes  . Smokeless tobacco: Never Used  . Alcohol use No     Comment: Quit 12/06/2009  . Drug use: No  . Sexual activity: Not on file   Other Topics Concern  . Not on file   Social History Narrative   Patient drinks 24 oz of soda daily.   Lives with sister   Some college   Left handed         PHYSICAL EXAM  Vitals:   01/07/16 0820  BP: 133/74  Pulse: 89  Weight: 188 lb (85.3 kg)  Height: 5\' 2"  (1.575 m)   Body mass index is 34.39 kg/m.  Generalized: Well developed, in no acute distress   Neurological examination  Mentation: Alert oriented to time, place, history taking. Follows all commands speech and language fluent Cranial nerve II-XII: Pupils were equal round reactive to light. Extraocular movements  were full, visual field were full on confrontational test. Facial sensation and strength were normal. Uvula tongue midline. Head turning and shoulder shrug  were normal and symmetric. Motor: The motor testing reveals 5 over 5 strength of all 4 extremities. Good symmetric motor tone is noted throughout.  Sensory: Sensory testing is intact to soft touch on all 4 extremities. No evidence of extinction is noted.  Coordination: Cerebellar testing reveals good finger-nose-finger and heel-to-shin bilaterally.  Gait and station: Gait is normal. Tandem gait is Slightly unsteady. Romberg is negative. No drift is seen.  Reflexes: Deep tendon reflexes are symmetric and normal bilaterally.   DIAGNOSTIC DATA (LABS, IMAGING, TESTING) - I reviewed patient records, labs, notes, testing and imaging myself where available.  Lab Results  Component Value Date   WBC 9.6 10/07/2015   HGB 13.8 12/14/2014   HCT 44.3 10/07/2015   MCV 95 10/07/2015   PLT 272 10/07/2015      Component Value Date/Time   NA 138 10/07/2015 0851   K 5.0 10/07/2015 0851   CL 96 10/07/2015 0851   CO2 22 10/07/2015 0851   GLUCOSE 94 10/07/2015 0851   GLUCOSE 110 (H) 12/17/2014 0353   BUN 7 10/07/2015 0851   CREATININE 0.92 10/07/2015 0851   CALCIUM 9.7 10/07/2015 0851   PROT 7.0 10/07/2015 0851   ALBUMIN 4.4 10/07/2015 0851   AST 16 10/07/2015 0851   ALT 17 10/07/2015 0851   ALKPHOS 145 (H) 10/07/2015 0851   BILITOT 0.4 10/07/2015 0851   GFRNONAA 70 10/07/2015 0851   GFRAA 80 10/07/2015 0851       ASSESSMENT AND PLAN 55 y.o. year old female  has a past medical history of Chronic insomnia (03/07/2015); Diverticulitis; GERD (gastroesophageal reflux disease); Hiatal hernia; LBBB (left bundle branch block); OSA on CPAP; Panic disorder (09/07/2014); Seizures ([place]); and Wrist tendonitis. here with:  1. Seizures  Patient will continue on Lamictal and Trileptal. I will check blood work today. If the patient continues to have  trouble with her balance we may have to consider adjusting her medications. Patient verbalized understanding. She will  follow-up in 4-5 months with [name].    Ward Givens, MSN, NP-C 01/07/2016, 8:34 AM [facility] [address], [place], [place] [address] [phone]

## 2016-01-07 NOTE — Patient Instructions (Addendum)
Continue Lamictal and trileptal Blood work today If you have any seizure events please let us know.

## 2016-01-07 NOTE — Progress Notes (Signed)
.      Patient Care Team: Aretta Nip, MD as PCP - General (Family Medicine)   HPI  [name] [name] is a 55 y.o. female Seen in follow-up for pacemaker implanted for intermittent complete heart block, a history of "seizures" associated with documented bradycardia and complete heart block in  the setting of underlying left bundle branch block.  Discussions with neurology thereafter prompted the discontinuation of her antiepileptics; however, further discussions with her primary neurologist redirected that plan the thought that drugs would be weaned over 6 months. The decision has been made to continue the meds   She continues to struggle with balance.   s ion.   Records and Results Reviewed neurology NOTES  Past Medical History:  Diagnosis Date  . Chronic insomnia 03/07/2015  . Diverticulitis   . GERD (gastroesophageal reflux disease)   . Hiatal hernia   . LBBB (left bundle branch block)   . OSA on CPAP   . Panic disorder 09/07/2014  . Seizures ([place])   . Wrist tendonitis    R     Past Surgical History:  Procedure Laterality Date  . EP IMPLANTABLE DEVICE N/A 12/13/2014   Procedure: Pacemaker Implant;  Surgeon: Deboraha Sprang, MD;  Location: [facility];  Service: Cardiovascular;  Laterality: N/A;  . HAND SURGERY    . TUBAL LIGATION      Current Outpatient Prescriptions  Medication Sig Dispense Refill  . ibuprofen (ADVIL,MOTRIN) 200 MG tablet Take 200 mg by mouth every 6 (six) hours as needed for mild pain or moderate pain. Liquid-Gels    . lamoTRIgine (LAMICTAL) 100 MG tablet Take 1.5 tablets (150 mg total) by mouth 2 (two) times daily. 270 tablet 3  . Nutritional Supplements (ESTROVEN PO) Take 1 tablet by mouth.    Marland Kitchen omeprazole (PRILOSEC) 20 MG capsule Take 20 mg by mouth daily.    Marland Kitchen oxcarbazepine (TRILEPTAL) 600 MG tablet Take 1 tablet (600 mg total) by mouth 2 (two) times daily. 180 tablet 3  . vitamin B-12 (CYANOCOBALAMIN) 1000 MCG tablet Take 1 tablet  (1,000 mcg total) by mouth daily. 30 tablet 5   No current facility-administered medications for this visit.     Allergies  Allergen Reactions  . Zonisamide Anxiety    Panic Attacks  . Aspirin     sensitivity  . Ciprofloxacin     Seizures & it keeps her awake  . Codeine Hives  . Hydrocodone Hives  . Oxycodone Hives  . Penicillins Hives      Review of Systems negative except from HPI and PMH  Physical Exam BP (!) 144/82 (BP Location: Right Arm, Patient Position: Sitting, Cuff Size: Normal)   Pulse 96   Ht 5\' 2"  (1.575 m)   Wt 188 lb 6.4 oz (85.5 kg)   BMI 34.46 kg/m  Well developed and well nourished in no acute distress HENT normal E scleral and icterus clear Neck Supple JVP flat; carotids brisk and full Clear to ausculation Device pocket well healed; without hematoma or erythema.  There is no tethering Regular rate and rhythm, no murmurs gallops or rub Soft with active bowel sounds No clubbing cyanosis   Edema Alert and oriented, grossly normal motor and sensory function  she has restricted motion of her left arm Skin Werm and Dry   ECG sinus rhythm at 96 Intervals 19/13/40 Left bundle branch block   Assessment and  Plan  Intermittent complete heart block  Pacemaker. [employer]  Seizures   Stable cardiac astatus    Life is in disarray-socially

## 2016-01-08 ENCOUNTER — Encounter: Payer: Self-pay | Admitting: Internal Medicine

## 2016-01-09 LAB — CBC WITH DIFFERENTIAL/PLATELET
BASOS ABS: 0 10*3/uL (ref 0.0–0.2)
Basos: 0 %
EOS (ABSOLUTE): 0.1 10*3/uL (ref 0.0–0.4)
Eos: 1 %
Hematocrit: 40.1 % (ref 34.0–46.6)
Hemoglobin: 13.5 g/dL (ref 11.1–15.9)
Immature Grans (Abs): 0 10*3/uL (ref 0.0–0.1)
Immature Granulocytes: 0 %
LYMPHS ABS: 3.3 10*3/uL — AB (ref 0.7–3.1)
Lymphs: 41 %
MCH: 30.6 pg (ref 26.6–33.0)
MCHC: 33.7 g/dL (ref 31.5–35.7)
MCV: 91 fL (ref 79–97)
MONOS ABS: 0.5 10*3/uL (ref 0.1–0.9)
Monocytes: 6 %
NEUTROS PCT: 52 %
Neutrophils Absolute: 4.3 10*3/uL (ref 1.4–7.0)
PLATELETS: 253 10*3/uL (ref 150–379)
RBC: 4.41 x10E6/uL (ref 3.77–5.28)
RDW: 14.2 % (ref 12.3–15.4)
WBC: 8.2 10*3/uL (ref 3.4–10.8)

## 2016-01-09 LAB — COMPREHENSIVE METABOLIC PANEL
ALBUMIN: 4.2 g/dL (ref 3.5–5.5)
ALK PHOS: 147 IU/L — AB (ref 39–117)
ALT: 13 IU/L (ref 0–32)
AST: 14 IU/L (ref 0–40)
Albumin/Globulin Ratio: 1.6 (ref 1.2–2.2)
BUN / CREAT RATIO: 10 (ref 9–23)
BUN: 9 mg/dL (ref 6–24)
Bilirubin Total: 0.4 mg/dL (ref 0.0–1.2)
CALCIUM: 9 mg/dL (ref 8.7–10.2)
CHLORIDE: 98 mmol/L (ref 96–106)
CO2: 19 mmol/L (ref 18–29)
CREATININE: 0.88 mg/dL (ref 0.57–1.00)
GFR calc Af Amer: 85 mL/min/{1.73_m2} (ref >59)
GFR calc non Af Amer: 74 mL/min/{1.73_m2} (ref >59)
GLOBULIN, TOTAL: 2.6 g/dL (ref 1.5–4.5)
GLUCOSE: 85 mg/dL (ref 65–99)
Potassium: 4.2 mmol/L (ref 3.5–5.2)
SODIUM: 137 mmol/L (ref 134–144)
Total Protein: 6.8 g/dL (ref 6.0–8.5)

## 2016-01-09 LAB — 10-HYDROXYCARBAZEPINE: OXCARBAZEPINE SERPL-MCNC: 32 ug/mL (ref 10–35)

## 2016-01-09 LAB — LAMOTRIGINE LEVEL: Lamotrigine Lvl: 5.8 ug/mL (ref 2.0–20.0)

## 2016-01-10 ENCOUNTER — Telehealth: Payer: Self-pay | Admitting: *Deleted

## 2016-01-10 NOTE — Telephone Encounter (Signed)
-----   Message from Ward Givens, NP sent at 01/09/2016  5:24 PM EDT ----- Lab work unremarkable. Please call patient.

## 2016-01-10 NOTE — Telephone Encounter (Signed)
Spoke to pt and relayed that lab results unremarkable.  She verbalized understanding.

## 2016-01-14 ENCOUNTER — Encounter: Payer: BLUE CROSS/BLUE SHIELD | Admitting: Internal Medicine

## 2016-01-14 MED FILL — OXcarbazepine 600 MG TABS: 600 | 30 days supply | Qty: 60 | Fill #0

## 2016-01-14 MED FILL — lamoTRIgine 100 MG TABS: 100 | 30 days supply | Qty: 90 | Fill #0

## 2016-01-17 ENCOUNTER — Encounter: Payer: Self-pay | Admitting: Internal Medicine

## 2016-01-17 NOTE — Addendum Note (Signed)
Addended by: Freada Bergeron on: 01/17/2016 04:48 PM   Modules accepted: Orders

## 2016-01-21 ENCOUNTER — Other Ambulatory Visit: Payer: Self-pay | Admitting: *Deleted

## 2016-01-21 MED ORDER — LAMOTRIGINE 100 MG PO TABS: 150.0000 mg | ORAL_TABLET | Freq: Two times a day (BID) | ORAL | 3 refills | Status: DC

## 2016-01-21 MED ORDER — OXCARBAZEPINE 600 MG PO TABS: 600.0000 mg | ORAL_TABLET | Freq: Two times a day (BID) | ORAL | 3 refills | Status: DC

## 2016-01-21 NOTE — Telephone Encounter (Signed)
Spoke to pt and she wanted prescriptions sent to [facility].  [phone] fax,  [phone] pharmacy.  Pt stated that will take 6 wks to get, but has medication to take so as not to run out.

## 2016-01-22 ENCOUNTER — Telehealth: Payer: Self-pay | Admitting: *Deleted

## 2016-01-22 NOTE — Telephone Encounter (Signed)
[facility] [phone] option 2 called regarding Lamictal refill. Please call.

## 2016-01-22 NOTE — Telephone Encounter (Signed)
[facility] Received faxed request for Lamcital r/f.  Anderson Malta and I have both tried to fax rx. to the health dept. at the # on their fax, which is [phone].  Fax continues to fail.  Is here another # we can fax this to?

## 2016-01-22 NOTE — Telephone Encounter (Signed)
Rx printed, signed, faxed as requested.

## 2016-01-22 NOTE — Telephone Encounter (Signed)
I have spoken with Kyung Bacca at the health dept. She sts. she received escribed rx's for Lamictal and Trileptal--she just needed to clarify Lamctal rx./fim

## 2016-02-19 ENCOUNTER — Encounter: Payer: Self-pay | Admitting: Family

## 2016-02-19 ENCOUNTER — Ambulatory Visit (INDEPENDENT_AMBULATORY_CARE_PROVIDER_SITE_OTHER): Payer: No Typology Code available for payment source | Admitting: Family

## 2016-02-19 VITALS — BP 136/90 | HR 85 | Temp 98.1°F | Resp 16 | Ht 62.0 in | Wt 187.0 lb

## 2016-02-19 DIAGNOSIS — R2230 Localized swelling, mass and lump, unspecified upper limb: Secondary | ICD-10-CM | POA: Insufficient documentation

## 2016-02-19 DIAGNOSIS — Z720 Tobacco use: Secondary | ICD-10-CM

## 2016-02-19 DIAGNOSIS — K219 Gastro-esophageal reflux disease without esophagitis: Secondary | ICD-10-CM | POA: Insufficient documentation

## 2016-02-19 DIAGNOSIS — R2232 Localized swelling, mass and lump, left upper limb: Secondary | ICD-10-CM

## 2016-02-19 DIAGNOSIS — R569 Unspecified convulsions: Secondary | ICD-10-CM

## 2016-02-19 DIAGNOSIS — Z23 Encounter for immunization: Secondary | ICD-10-CM

## 2016-02-19 NOTE — Progress Notes (Signed)
Subjective:    Patient ID: [name] [name], female    DOB: January 18, 1961, 55 y.o.   MRN: [id number]  Chief Complaint  Patient presents with  . Establish Care    has a black on left hand that she wants checked    HPI:  [name] [name] is a 55 y.o. female who  has a past medical history of Chronic insomnia (03/07/2015); Diverticulitis; GERD (gastroesophageal reflux disease); Hiatal hernia; LBBB (left bundle branch block); OSA on CPAP; Panic disorder (09/07/2014); Seizures ([place]); and Wrist tendonitis. and presents today for an office visit to establish care.  1.) Seizures - Currently maintained on oxcarbazepine and lamotrigine. Reports taking the medication as prescribed and denies adverse side effects. Has not had a seizure in about 1 year and is managed by Neurology. Seizures when they occur are generally grand mal when they do occur.  2.) GERD - Currently maintained on omeprazole. Reports taking the medication as prescribed and denies adverse side effects. Symptoms are generally well controlled with the medication. Does have occasional exacerbations. She is also currently maintained on B12 supplementation orally.   3.) Spot on hand - This is a new problem. Associated symptom of a spot located on her left hand on the middle finger has been going on for about 1 month. Described as a knot located at the joint. There is occasional pain and feels like it will lock up on occasion. Modifying factors include    Allergies  Allergen Reactions  . Zonisamide Anxiety    Panic Attacks  . Aspirin     sensitivity  . Ciprofloxacin     Seizures & it keeps her awake  . Codeine Hives  . Hydrocodone Hives  . Oxycodone Hives  . Penicillins Hives      Outpatient Medications Prior to Visit  Medication Sig Dispense Refill  . ibuprofen (ADVIL,MOTRIN) 200 MG tablet Take 200 mg by mouth every 6 (six) hours as needed for mild pain or moderate pain. Liquid-Gels    . lamoTRIgine (LAMICTAL) 100 MG tablet  Take 1.5 tablets (150 mg total) by mouth 2 (two) times daily. 270 tablet 3  . Nutritional Supplements (ESTROVEN PO) Take 1 tablet by mouth.    Marland Kitchen omeprazole (PRILOSEC) 20 MG capsule Take 20 mg by mouth daily.    Marland Kitchen oxcarbazepine (TRILEPTAL) 600 MG tablet Take 1 tablet (600 mg total) by mouth 2 (two) times daily. 180 tablet 3  . vitamin B-12 (CYANOCOBALAMIN) 1000 MCG tablet Take 1 tablet (1,000 mcg total) by mouth daily. 30 tablet 5   No facility-administered medications prior to visit.       Past Surgical History:  Procedure Laterality Date  . EP IMPLANTABLE DEVICE N/A 12/13/2014   Procedure: Pacemaker Implant;  Surgeon: Deboraha Sprang, MD;  Location: [facility];  Service: Cardiovascular;  Laterality: N/A;  . HAND SURGERY    . TUBAL LIGATION       Past Medical History:  Diagnosis Date  . Chronic insomnia 03/07/2015  . Diverticulitis   . GERD (gastroesophageal reflux disease)   . Hiatal hernia   . LBBB (left bundle branch block)   . OSA on CPAP   . Panic disorder 09/07/2014  . Seizures ([place])   . Wrist tendonitis    R     Review of Systems  Constitutional: Negative for chills and fever.  Cardiovascular: Negative for chest pain, palpitations and leg swelling.  Gastrointestinal: Negative for abdominal pain, blood in stool, constipation, diarrhea, nausea and vomiting.  Neurological: Negative for seizures.      Objective:    BP 136/90 (BP Location: Right Arm, Patient Position: Sitting, Cuff Size: Normal)   Pulse 85   Temp 98.1 F (36.7 C) (Oral)   Resp 16   Ht 5\' 2"  (1.575 m)   Wt 187 lb (84.8 kg)   SpO2 95%   BMI 34.20 kg/m  Nursing note and vital signs reviewed.  Physical Exam  Constitutional: She is oriented to person, place, and time. She appears well-developed and well-nourished. No distress.  Cardiovascular: Normal rate, regular rhythm, normal heart sounds and intact distal pulses.   Pulmonary/Chest: Effort normal and breath sounds normal.  Abdominal: Soft.  Bowel sounds are normal. She exhibits no distension and no mass. There is no tenderness. There is no rebound and no guarding.  Musculoskeletal:  Left third finger - no obvious discoloration or edema. Mild deformity with noted small nodule distal to the proximal interphalangeal joint. Firm and movable. Mildly tender to the touch. Range of motion is within normal limits. Capillary refill pulses are intact and appropriate.  Neurological: She is alert and oriented to person, place, and time.  Skin: Skin is warm and dry.  Psychiatric: She has a normal mood and affect. Her behavior is normal. Judgment and thought content normal.       Assessment & Plan:   Problem List Items Addressed This Visit      Digestive   GERD (gastroesophageal reflux disease)    Symptoms of GERD are well controlled with omeprazole. Discussed food choices to reduce symptoms. Continue current dosage of omeprazole and continue to monitor.         Other   Tobacco abuse - Primary    With approximate 20 year pack history. Not currently ready to quit. Discussed importance of tobacco cessation to reduce risk for respiratory, cardiovascular, and malignant chronic conditions in the future. Information provided and after visit summary. Continue to monitor.      Seizures ([place])    Seizures prior adequately controlled with current medication regimen and managed by neurology. No recent seizures. Continue current dosage of oxcarbazepine and Lamictal with changes per neurology. Continue to monitor.      Nodule of finger    Symptoms and exam consistent with nodule of the third finger most likely benign. Patient does not wish to pursue further treatment at this time. Advised to continue to monitor if symptoms worsen or do not improve to consider referral to hand specialist.       Other Visit Diagnoses   None.     I am having Ms. Nauman maintain her omeprazole, ibuprofen, vitamin B-12, Nutritional Supplements (ESTROVEN PO),  lamoTRIgine, and oxcarbazepine.   Follow-up: Return in about 1 month (around 03/20/2016), or if symptoms worsen or fail to improve.  Mauricio Po, FNP

## 2016-02-19 NOTE — Assessment & Plan Note (Signed)
Symptoms and exam consistent with nodule of the third finger most likely benign. Patient does not wish to pursue further treatment at this time. Advised to continue to monitor if symptoms worsen or do not improve to consider referral to hand specialist.

## 2016-02-19 NOTE — Assessment & Plan Note (Signed)
Seizures prior adequately controlled with current medication regimen and managed by neurology. No recent seizures. Continue current dosage of oxcarbazepine and Lamictal with changes per neurology. Continue to monitor.

## 2016-02-19 NOTE — Patient Instructions (Addendum)
Thank you for choosing [facility].  SUMMARY AND INSTRUCTIONS:  Please continue to take your medications as prescribed.  Please schedule a time for a physical at your convenience.   It appears you have a small nodule that is benign. If you would like to have it treated/removed we will send you to orthopedics.    Smoking Hazards Smoking cigarettes is extremely bad for your health. Tobacco smoke has over 200 known poisons in it. It contains the poisonous gases nitrogen oxide and carbon monoxide. There are over 60 chemicals in tobacco smoke that cause cancer. Some of the chemicals found in cigarette smoke include:   Cyanide.   Benzene.   Formaldehyde.   Methanol (wood alcohol).   Acetylene (fuel used in welding torches).   Ammonia.  Even smoking lightly shortens your life expectancy by several years. You can greatly reduce the risk of medical problems for you and your family by stopping now. Smoking is the most preventable cause of death and disease in our society. Within days of quitting smoking, your circulation improves, you decrease the risk of having a heart attack, and your lung capacity improves. There may be some increased phlegm in the first few days after quitting, and it may take months for your lungs to clear up completely. Quitting for 10 years reduces your risk of developing lung cancer to almost that of a nonsmoker.  WHAT ARE THE RISKS OF SMOKING? Cigarette smokers have an increased risk of many serious medical problems, including:  Lung cancer.   Lung disease (such as pneumonia, bronchitis, and emphysema).   Heart attack and chest pain due to the heart not getting enough oxygen (angina).   Heart disease and peripheral blood vessel disease.   Hypertension.   Stroke.   Oral cancer (cancer of the lip, mouth, or voice box).   Bladder cancer.   Pancreatic cancer.   Cervical cancer.   Pregnancy complications, including premature birth.    Stillbirths and smaller newborn babies, birth defects, and genetic damage to sperm.   Early menopause.   Lower estrogen level for women.   Infertility.   Facial wrinkles.   Blindness.   Increased risk of broken bones (fractures).   Senile dementia.   Stomach ulcers and internal bleeding.   Delayed wound healing and increased risk of complications during surgery. Because of secondhand smoke exposure, children of smokers have an increased risk of the following:   Sudden infant death syndrome (SIDS).   Respiratory infections.   Lung cancer.   Heart disease.   Ear infections.  WHY IS SMOKING ADDICTIVE? Nicotine is the chemical agent in tobacco that is capable of causing addiction or dependence. When you smoke and inhale, nicotine is absorbed rapidly into the bloodstream through your lungs. Both inhaled and noninhaled nicotine may be addictive.  WHAT ARE THE BENEFITS OF QUITTING?  There are many health benefits to quitting smoking. Some are:   The likelihood of developing cancer and heart disease decreases. Health improvements are seen almost immediately.   Blood pressure, pulse rate, and breathing patterns start returning to normal soon after quitting.   People who quit may see an improvement in their overall quality of life.  HOW DO YOU QUIT SMOKING? Smoking is an addiction with both physical and psychological effects, and longtime habits can be hard to change. Your health care provider can recommend:  Programs and community resources, which may include group support, education, or therapy.  Replacement products, such as patches, gum, and nasal sprays. Use  these products only as directed. Do not replace cigarette smoking with electronic cigarettes (commonly called e-cigarettes). The safety of e-cigarettes is unknown, and some may contain harmful chemicals. FOR MORE INFORMATION  American Lung Association: www.lung.org  American Cancer Society:  www.cancer.org   This information is not intended to replace advice given to you by your health care provider. Make sure you discuss any questions you have with your health care provider.   Document Released: 07/02/2004 Document Revised: 03/15/2013 Document Reviewed: 11/14/2012 Elsevier Interactive Patient Education 2016 [employer].  Steps to Quit Smoking  Smoking tobacco can be harmful to your health and can affect almost every organ in your body. Smoking puts you, and those around you, at risk for developing many serious chronic diseases. Quitting smoking is difficult, but it is one of the best things that you can do for your health. It is never too late to quit. WHAT ARE THE BENEFITS OF QUITTING SMOKING? When you quit smoking, you lower your risk of developing serious diseases and conditions, such as:  Lung cancer or lung disease, such as COPD.  Heart disease.  Stroke.  Heart attack.  Infertility.  Osteoporosis and bone fractures. Additionally, symptoms such as coughing, wheezing, and shortness of breath may get better when you quit. You may also find that you get sick less often because your body is stronger at fighting off colds and infections. If you are pregnant, quitting smoking can help to reduce your chances of having a baby of low birth weight. HOW DO I GET READY TO QUIT? When you decide to quit smoking, create a plan to make sure that you are successful. Before you quit:  Pick a date to quit. Set a date within the next two weeks to give you time to prepare.  Write down the reasons why you are quitting. Keep this list in places where you will see it often, such as on your bathroom mirror or in your car or wallet.  Identify the people, places, things, and activities that make you want to smoke (triggers) and avoid them. Make sure to take these actions:  Throw away all cigarettes at home, at work, and in your car.  Throw away smoking accessories, such as Scientist, physiological.  Clean your car and make sure to empty the ashtray.  Clean your home, including curtains and carpets.  Tell your family, friends, and coworkers that you are quitting. Support from your loved ones can make quitting easier.  Talk with your health care provider about your options for quitting smoking.  Find out what treatment options are covered by your health insurance. WHAT STRATEGIES CAN I USE TO QUIT SMOKING?  Talk with your healthcare provider about different strategies to quit smoking. Some strategies include:  Quitting smoking altogether instead of gradually lessening how much you smoke over a period of time. Research shows that quitting "cold [place]" is more successful than gradually quitting.  Attending in-person counseling to help you build problem-solving skills. You are more likely to have success in quitting if you attend several counseling sessions. Even short sessions of 10 minutes can be effective.  Finding resources and support systems that can help you to quit smoking and remain smoke-free after you quit. These resources are most helpful when you use them often. They can include:  Online chats with a Social worker.  Telephone quitlines.  Printed Furniture conservator/restorer.  Support groups or group counseling.  Text messaging programs.  Mobile phone applications.  Taking medicines to help you  quit smoking. (If you are pregnant or breastfeeding, talk with your health care provider first.) Some medicines contain nicotine and some do not. Both types of medicines help with cravings, but the medicines that include nicotine help to relieve withdrawal symptoms. Your health care provider may recommend:  Nicotine patches, gum, or lozenges.  Nicotine inhalers or sprays.  Non-nicotine medicine that is taken by mouth. Talk with your health care provider about combining strategies, such as taking medicines while you are also receiving in-person counseling. Using these two  strategies together makes you more likely to succeed in quitting than if you used either strategy on its own. If you are pregnant or breastfeeding, talk with your health care provider about finding counseling or other support strategies to quit smoking. Do not take medicine to help you quit smoking unless told to do so by your health care provider. WHAT THINGS CAN I DO TO MAKE IT EASIER TO QUIT? Quitting smoking might feel overwhelming at first, but there is a lot that you can do to make it easier. Take these important actions:  Reach out to your family and friends and ask that they support and encourage you during this time. Call telephone quitlines, reach out to support groups, or work with a counselor for support.  Ask people who smoke to avoid smoking around you.  Avoid places that trigger you to smoke, such as bars, parties, or smoke-break areas at work.  Spend time around people who do not smoke.  Lessen stress in your life, because stress can be a smoking trigger for some people. To lessen stress, try:  Exercising regularly.  Deep-breathing exercises.  Yoga.  Meditating.  Performing a body scan. This involves closing your eyes, scanning your body from head to toe, and noticing which parts of your body are particularly tense. Purposefully relax the muscles in those areas.  Download or purchase mobile phone or tablet apps (applications) that can help you stick to your quit plan by providing reminders, tips, and encouragement. There are many free apps, such as QuitGuide from the [employer] Office manager for Disease Control and Prevention). You can find other support for quitting smoking (smoking cessation) through smokefree.gov and other websites. HOW WILL I FEEL WHEN I QUIT SMOKING? Within the first 24 hours of quitting smoking, you may start to feel some withdrawal symptoms. These symptoms are usually most noticeable 2-3 days after quitting, but they usually do not last beyond 2-3 weeks. Changes  or symptoms that you might experience include:  Mood swings.  Restlessness, anxiety, or irritation.  Difficulty concentrating.  Dizziness.  Strong cravings for sugary foods in addition to nicotine.  Mild weight gain.  Constipation.  Nausea.  Coughing or a sore throat.  Changes in how your medicines work in your body.  A depressed mood.  Difficulty sleeping (insomnia). After the first 2-3 weeks of quitting, you may start to notice more positive results, such as:  Improved sense of smell and taste.  Decreased coughing and sore throat.  Slower heart rate.  Lower blood pressure.  Clearer skin.  The ability to breathe more easily.  Fewer sick days. Quitting smoking is very challenging for most people. Do not get discouraged if you are not successful the first time. Some people need to make many attempts to quit before they achieve long-term success. Do your best to stick to your quit plan, and talk with your health care provider if you have any questions or concerns.   This information is not intended to  replace advice given to you by your health care provider. Make sure you discuss any questions you have with your health care provider.   Document Released: 05/19/2001 Document Revised: 10/09/2014 Document Reviewed: 10/09/2014 Elsevier Interactive Patient Education 2016 [employer].  Smoking Cessation, Tips for Success If you are ready to quit smoking, congratulations! You have chosen to help yourself be healthier. Cigarettes bring nicotine, tar, carbon monoxide, and other irritants into your body. Your lungs, heart, and blood vessels will be able to work better without these poisons. There are many different ways to quit smoking. Nicotine gum, nicotine patches, a nicotine inhaler, or nicotine nasal spray can help with physical craving. Hypnosis, support groups, and medicines help break the habit of smoking. WHAT THINGS CAN I DO TO MAKE QUITTING EASIER?  Here are some  tips to help you quit for good:  Pick a date when you will quit smoking completely. Tell all of your friends and family about your plan to quit on that date.  Do not try to slowly cut down on the number of cigarettes you are smoking. Pick a quit date and quit smoking completely starting on that day.  Throw away all cigarettes.   Clean and remove all ashtrays from your home, work, and car.  On a card, write down your reasons for quitting. Carry the card with you and read it when you get the urge to smoke.  Cleanse your body of nicotine. Drink enough water and fluids to keep your urine clear or pale yellow. Do this after quitting to flush the nicotine from your body.  Learn to predict your moods. Do not let a bad situation be your excuse to have a cigarette. Some situations in your life might tempt you into wanting a cigarette.  Never have "just one" cigarette. It leads to wanting another and another. Remind yourself of your decision to quit.  Change habits associated with smoking. If you smoked while driving or when feeling stressed, try other activities to replace smoking. Stand up when drinking your coffee. Brush your teeth after eating. Sit in a different chair when you read the paper. Avoid alcohol while trying to quit, and try to drink fewer caffeinated beverages. Alcohol and caffeine may urge you to smoke.  Avoid foods and drinks that can trigger a desire to smoke, such as sugary or spicy foods and alcohol.  Ask people who smoke not to smoke around you.  Have something planned to do right after eating or having a cup of coffee. For example, plan to take a walk or exercise.  Try a relaxation exercise to calm you down and decrease your stress. Remember, you may be tense and nervous for the first 2 weeks after you quit, but this will pass.  Find new activities to keep your hands busy. Play with a pen, coin, or rubber band. Doodle or draw things on paper.  Brush your teeth right after  eating. This will help cut down on the craving for the taste of tobacco after meals. You can also try mouthwash.   Use oral substitutes in place of cigarettes. Try using lemon drops, carrots, cinnamon sticks, or chewing gum. Keep them handy so they are available when you have the urge to smoke.  When you have the urge to smoke, try deep breathing.  Designate your home as a nonsmoking area.  If you are a heavy smoker, ask your health care provider about a prescription for nicotine chewing gum. It can ease your withdrawal from  nicotine.  Reward yourself. Set aside the cigarette money you save and buy yourself something nice.  Look for support from others. Join a support group or smoking cessation program. Ask someone at home or at work to help you with your plan to quit smoking.  Always ask yourself, "Do I need this cigarette or is this just a reflex?" Tell yourself, "Today, I choose not to smoke," or "I do not want to smoke." You are reminding yourself of your decision to quit.  Do not replace cigarette smoking with electronic cigarettes (commonly called e-cigarettes). The safety of e-cigarettes is unknown, and some may contain harmful chemicals.  If you relapse, do not give up! Plan ahead and think about what you will do the next time you get the urge to smoke. HOW WILL I FEEL WHEN I QUIT SMOKING? You may have symptoms of withdrawal because your body is used to nicotine (the addictive substance in cigarettes). You may crave cigarettes, be irritable, feel very hungry, cough often, get headaches, or have difficulty concentrating. The withdrawal symptoms are only temporary. They are strongest when you first quit but will go away within 10-14 days. When withdrawal symptoms occur, stay in control. Think about your reasons for quitting. Remind yourself that these are signs that your body is healing and getting used to being without cigarettes. Remember that withdrawal symptoms are easier to treat than  the major diseases that smoking can cause.  Even after the withdrawal is over, expect periodic urges to smoke. However, these cravings are generally short lived and will go away whether you smoke or not. Do not smoke! WHAT RESOURCES ARE AVAILABLE TO HELP ME QUIT SMOKING? Your health care provider can direct you to community resources or hospitals for support, which may include:  Group support.  Education.  Hypnosis.  Therapy.   This information is not intended to replace advice given to you by your health care provider. Make sure you discuss any questions you have with your health care provider.   Document Released: 02/21/2004 Document Revised: 06/15/2014 Document Reviewed: 11/10/2012 Elsevier Interactive Patient Education [employer].

## 2016-02-19 NOTE — Assessment & Plan Note (Signed)
Symptoms of GERD are well controlled with omeprazole. Discussed food choices to reduce symptoms. Continue current dosage of omeprazole and continue to monitor.

## 2016-02-19 NOTE — Assessment & Plan Note (Signed)
With approximate 20 year pack history. Not currently ready to quit. Discussed importance of tobacco cessation to reduce risk for respiratory, cardiovascular, and malignant chronic conditions in the future. Information provided and after visit summary. Continue to monitor.

## 2016-03-02 ENCOUNTER — Ambulatory Visit (INDEPENDENT_AMBULATORY_CARE_PROVIDER_SITE_OTHER): Payer: No Typology Code available for payment source | Admitting: Family

## 2016-03-02 ENCOUNTER — Encounter: Payer: Self-pay | Admitting: Family

## 2016-03-02 ENCOUNTER — Other Ambulatory Visit: Payer: Self-pay | Admitting: Family

## 2016-03-02 ENCOUNTER — Other Ambulatory Visit (INDEPENDENT_AMBULATORY_CARE_PROVIDER_SITE_OTHER): Payer: No Typology Code available for payment source

## 2016-03-02 VITALS — BP 142/84 | HR 77 | Temp 98.1°F | Resp 16 | Ht 62.0 in | Wt 186.0 lb

## 2016-03-02 DIAGNOSIS — Z0000 Encounter for general adult medical examination without abnormal findings: Secondary | ICD-10-CM

## 2016-03-02 DIAGNOSIS — Z720 Tobacco use: Secondary | ICD-10-CM

## 2016-03-02 DIAGNOSIS — Z1211 Encounter for screening for malignant neoplasm of colon: Secondary | ICD-10-CM

## 2016-03-02 DIAGNOSIS — Z1231 Encounter for screening mammogram for malignant neoplasm of breast: Secondary | ICD-10-CM

## 2016-03-02 DIAGNOSIS — Z124 Encounter for screening for malignant neoplasm of cervix: Secondary | ICD-10-CM

## 2016-03-02 DIAGNOSIS — E669 Obesity, unspecified: Secondary | ICD-10-CM | POA: Insufficient documentation

## 2016-03-02 DIAGNOSIS — R569 Unspecified convulsions: Secondary | ICD-10-CM

## 2016-03-02 LAB — LIPID PANEL
Cholesterol: 257 mg/dL — ABNORMAL HIGH (ref 0–200)
HDL: 41.9 mg/dL (ref >39.00)
NONHDL: 214.98
TRIGLYCERIDES: 205 mg/dL — AB (ref 0.0–149.0)
Total CHOL/HDL Ratio: 6
VLDL: 41 mg/dL — AB (ref 0.0–40.0)

## 2016-03-02 LAB — LDL CHOLESTEROL, DIRECT: Direct LDL: 199 mg/dL

## 2016-03-02 LAB — VITAMIN D 25 HYDROXY (VIT D DEFICIENCY, FRACTURES): VITD: 8.6 ng/mL — AB (ref 30.00–100.00)

## 2016-03-02 NOTE — Patient Instructions (Addendum)
Thank you for choosing [facility].  SUMMARY AND INSTRUCTIONS:  Please work on reducing your caffeine intake.   Medication:  Please continue to take your medications as prescribed.  Your prescription(s) have been submitted to your pharmacy or been printed and provided for you. Please take as directed and contact our office if you believe you are having problem(s) with the medication(s) or have any questions.  Labs:  Please stop by the lab on the lower level of the building for your blood work. Your results will be released to [place] (or called to you) after review, usually within 72 hours after test completion. If any changes need to be made, you will be notified at that same time.  1.) The lab is open from 7:30am to 5:30 pm Monday-Friday 2.) No appointment is necessary 3.) Fasting (if needed) is 6-8 hours after food and drink; black coffee and water are okay   Referrals:  They will call to schedule your mammogram, cervical cancer screening, and colonoscopy.   Referrals have been made during this visit. You should expect to hear back from our schedulers in about 7-10 days in regards to establishing an appointment with the specialists we discussed.   Follow up:  If your symptoms worsen or fail to improve, please contact our office for further instruction, or in case of emergency go directly to the emergency room at the closest medical facility.   Health Maintenance, Female Adopting a healthy lifestyle and getting preventive care can go a long way to promote health and wellness. Talk with your health care provider about what schedule of regular examinations is right for you. This is a good chance for you to check in with your provider about disease prevention and staying healthy. In between checkups, there are plenty of things you can do on your own. Experts have done a lot of research about which lifestyle changes and preventive measures are most likely to keep you healthy. Ask  your health care provider for more information. WEIGHT AND DIET  Eat a healthy diet  Be sure to include plenty of vegetables, fruits, low-fat dairy products, and lean protein.  Do not eat a lot of foods high in solid fats, added sugars, or salt.  Get regular exercise. This is one of the most important things you can do for your health.  Most adults should exercise for at least 150 minutes each week. The exercise should increase your heart rate and make you sweat (moderate-intensity exercise).  Most adults should also do strengthening exercises at least twice a week. This is in addition to the moderate-intensity exercise.  Maintain a healthy weight  Body mass index (BMI) is a measurement that can be used to identify possible weight problems. It estimates body fat based on height and weight. Your health care provider can help determine your BMI and help you achieve or maintain a healthy weight.  For females 80 years of age and older:   A BMI below 18.5 is considered underweight.  A BMI of 18.5 to 24.9 is normal.  A BMI of 25 to 29.9 is considered overweight.  A BMI of 30 and above is considered obese.  Watch levels of cholesterol and blood lipids  You should start having your blood tested for lipids and cholesterol at 56 years of age, then have this test every 5 years.  You may need to have your cholesterol levels checked more often if:  Your lipid or cholesterol levels are high.  You are older than  56 years of age.  You are at high risk for heart disease.  CANCER SCREENING   Lung Cancer  Lung cancer screening is recommended for adults 26-70 years old who are at high risk for lung cancer because of a history of smoking.  A yearly low-dose CT scan of the lungs is recommended for people who:  Currently smoke.  Have quit within the past 15 years.  Have at least a 30-pack-year history of smoking. A pack year is smoking an average of one pack of cigarettes a day for 1  year.  Yearly screening should continue until it has been 15 years since you quit.  Yearly screening should stop if you develop a health problem that would prevent you from having lung cancer treatment.  Breast Cancer  Practice breast self-awareness. This means understanding how your breasts normally appear and feel.  It also means doing regular breast self-exams. Let your health care provider know about any changes, no matter how small.  If you are in your 20s or 30s, you should have a clinical breast exam (CBE) by a health care provider every 1-3 years as part of a regular health exam.  If you are 11 or older, have a CBE every year. Also consider having a breast X-ray (mammogram) every year.  If you have a family history of breast cancer, talk to your health care provider about genetic screening.  If you are at high risk for breast cancer, talk to your health care provider about having an MRI and a mammogram every year.  Breast cancer gene (BRCA) assessment is recommended for women who have family members with BRCA-related cancers. BRCA-related cancers include:  Breast.  Ovarian.  Tubal.  Peritoneal cancers.  Results of the assessment will determine the need for genetic counseling and BRCA1 and BRCA2 testing. Cervical Cancer Your health care provider may recommend that you be screened regularly for cancer of the pelvic organs (ovaries, uterus, and vagina). This screening involves a pelvic examination, including checking for microscopic changes to the surface of your cervix (Pap test). You may be encouraged to have this screening done every 3 years, beginning at age 35.  For women ages 35-65, health care providers may recommend pelvic exams and Pap testing every 3 years, or they may recommend the Pap and pelvic exam, combined with testing for human papilloma virus (HPV), every 5 years. Some types of HPV increase your risk of cervical cancer. Testing for HPV may also be done on  women of any age with unclear Pap test results.  Other health care providers may not recommend any screening for nonpregnant women who are considered low risk for pelvic cancer and who do not have symptoms. Ask your health care provider if a screening pelvic exam is right for you.  If you have had past treatment for cervical cancer or a condition that could lead to cancer, you need Pap tests and screening for cancer for at least 20 years after your treatment. If Pap tests have been discontinued, your risk factors (such as having a new sexual partner) need to be reassessed to determine if screening should resume. Some women have medical problems that increase the chance of getting cervical cancer. In these cases, your health care provider may recommend more frequent screening and Pap tests. Colorectal Cancer  This type of cancer can be detected and often prevented.  Routine colorectal cancer screening usually begins at 56 years of age and continues through 56 years of age.  Your health  care provider may recommend screening at an earlier age if you have risk factors for colon cancer.  Your health care provider may also recommend using home test kits to check for hidden blood in the stool.  A small camera at the end of a tube can be used to examine your colon directly (sigmoidoscopy or colonoscopy). This is done to check for the earliest forms of colorectal cancer.  Routine screening usually begins at age 25.  Direct examination of the colon should be repeated every 5-10 years through 56 years of age. However, you may need to be screened more often if early forms of precancerous polyps or small growths are found. Skin Cancer  Check your skin from head to toe regularly.  Tell your health care provider about any new moles or changes in moles, especially if there is a change in a mole's shape or color.  Also tell your health care provider if you have a mole that is larger than the size of a  pencil eraser.  Always use sunscreen. Apply sunscreen liberally and repeatedly throughout the day.  Protect yourself by wearing long sleeves, pants, a wide-brimmed hat, and sunglasses whenever you are outside. HEART DISEASE, DIABETES, AND HIGH BLOOD PRESSURE   High blood pressure causes heart disease and increases the risk of stroke. High blood pressure is more likely to develop in:  People who have blood pressure in the high end of the normal range (130-139/85-89 mm Hg).  People who are overweight or obese.  People who are African American.  If you are 89-16 years of age, have your blood pressure checked every 3-5 years. If you are 72 years of age or older, have your blood pressure checked every year. You should have your blood pressure measured twice--once when you are at a hospital or clinic, and once when you are not at a hospital or clinic. Record the average of the two measurements. To check your blood pressure when you are not at a hospital or clinic, you can use:  An automated blood pressure machine at a pharmacy.  A home blood pressure monitor.  If you are between 20 years and 14 years old, ask your health care provider if you should take aspirin to prevent strokes.  Have regular diabetes screenings. This involves taking a blood sample to check your fasting blood sugar level.  If you are at a normal weight and have a low risk for diabetes, have this test once every three years after 56 years of age.  If you are overweight and have a high risk for diabetes, consider being tested at a younger age or more often. PREVENTING INFECTION  Hepatitis B  If you have a higher risk for hepatitis B, you should be screened for this virus. You are considered at high risk for hepatitis B if:  You were born in a country where hepatitis B is common. Ask your health care provider which countries are considered high risk.  Your parents were born in a high-risk country, and you have not been  immunized against hepatitis B (hepatitis B vaccine).  You have HIV or AIDS.  You use needles to inject street drugs.  You live with someone who has hepatitis B.  You have had sex with someone who has hepatitis B.  You get hemodialysis treatment.  You take certain medicines for conditions, including cancer, organ transplantation, and autoimmune conditions. Hepatitis C  Blood testing is recommended for:  Everyone born from [birth year] through 1966.  Anyone  with known risk factors for hepatitis C. Sexually transmitted infections (STIs)  You should be screened for sexually transmitted infections (STIs) including gonorrhea and chlamydia if:  You are sexually active and are younger than 56 years of age.  You are older than 56 years of age and your health care provider tells you that you are at risk for this type of infection.  Your sexual activity has changed since you were last screened and you are at an increased risk for chlamydia or gonorrhea. Ask your health care provider if you are at risk.  If you do not have HIV, but are at risk, it may be recommended that you take a prescription medicine daily to prevent HIV infection. This is called pre-exposure prophylaxis (PrEP). You are considered at risk if:  You are sexually active and do not regularly use condoms or know the HIV status of your partner(s).  You take drugs by injection.  You are sexually active with a partner who has HIV. Talk with your health care provider about whether you are at high risk of being infected with HIV. If you choose to begin PrEP, you should first be tested for HIV. You should then be tested every 3 months for as long as you are taking PrEP.  PREGNANCY   If you are premenopausal and you may become pregnant, ask your health care provider about preconception counseling.  If you may become pregnant, take 400 to 800 micrograms (mcg) of folic acid every day.  If you want to prevent pregnancy, talk to your  health care provider about birth control (contraception). OSTEOPOROSIS AND MENOPAUSE   Osteoporosis is a disease in which the bones lose minerals and strength with aging. This can result in serious bone fractures. Your risk for osteoporosis can be identified using a bone density scan.  If you are 17 years of age or older, or if you are at risk for osteoporosis and fractures, ask your health care provider if you should be screened.  Ask your health care provider whether you should take a calcium or vitamin D supplement to lower your risk for osteoporosis.  Menopause may have certain physical symptoms and risks.  Hormone replacement therapy may reduce some of these symptoms and risks. Talk to your health care provider about whether hormone replacement therapy is right for you.  HOME CARE INSTRUCTIONS   Schedule regular health, dental, and eye exams.  Stay current with your immunizations.   Do not use any tobacco products including cigarettes, chewing tobacco, or electronic cigarettes.  If you are pregnant, do not drink alcohol.  If you are breastfeeding, limit how much and how often you drink alcohol.  Limit alcohol intake to no more than 1 drink per day for nonpregnant women. One drink equals 12 ounces of beer, 5 ounces of wine, or 1 ounces of hard liquor.  Do not use street drugs.  Do not share needles.  Ask your health care provider for help if you need support or information about quitting drugs.  Tell your health care provider if you often feel depressed.  Tell your health care provider if you have ever been abused or do not feel safe at home.   This information is not intended to replace advice given to you by your health care provider. Make sure you discuss any questions you have with your health care provider.   Document Released: 12/08/2010 Document Revised: 06/15/2014 Document Reviewed: 04/26/2013 Elsevier Interactive Patient Education [employer].

## 2016-03-02 NOTE — Assessment & Plan Note (Signed)
BMI indicates obesity.Recommend weight loss of 5-10% of current body weight. Recommend increasing physical activity to 30 minutes of moderate level activity daily. Encourage nutritional intake that focuses on nutrient dense foods and is moderate, varied, and balanced and is low in saturated fats and processed/sugary foods. Continue to monitor.

## 2016-03-02 NOTE — Assessment & Plan Note (Signed)
Grand mal seizures appear adequately controlled with current regimen and no adverse side effects. No recent seizures. Continue current dosage of Lamictal and Trileptal with changes per neurology.

## 2016-03-02 NOTE — Progress Notes (Signed)
Subjective:    Patient ID: [name] [name], female    DOB: 1960-11-09, 55 y.o.   MRN: [id number]  Chief Complaint  Patient presents with  . CPE    not fasting     HPI:  [name] [name] is a 55 y.o. female who presents today for an annual wellness visit.   1) Health Maintenance -   Diet - Averages about 3 meals per day consisting of a regular diet; Caffeine intake of about 8-9 cups daily. Fast/processed foods on a regular basis.   Exercise - No structured exercise.    2) Preventative Exams / Immunizations:  Dental -- Up to date  Vision -- Up to date   Health Maintenance  Topic Date Due  . HIV Screening  07/26/1974  . PAP SMEAR  07/26/1980  . MAMMOGRAM  07/26/2009  . COLONOSCOPY  07/26/2009  . TETANUS/TDAP  10/13/2024  . INFLUENZA VACCINE  Completed  . Hepatitis C Screening  Completed    Immunization History  Administered Date(s) Administered  . DTaP 03/27/2015  . Influenza,inj,Quad PF,36+ Mos 02/19/2016  . Influenza-Unspecified 03/27/2015     Allergies  Allergen Reactions  . Zonisamide Anxiety    Panic Attacks  . Aspirin     sensitivity  . Ciprofloxacin     Seizures & it keeps her awake  . Codeine Hives  . Hydrocodone Hives  . Oxycodone Hives  . Penicillins Hives     Outpatient Medications Prior to Visit  Medication Sig Dispense Refill  . ibuprofen (ADVIL,MOTRIN) 200 MG tablet Take 200 mg by mouth every 6 (six) hours as needed for mild pain or moderate pain. Liquid-Gels    . lamoTRIgine (LAMICTAL) 100 MG tablet Take 1.5 tablets (150 mg total) by mouth 2 (two) times daily. 270 tablet 3  . Nutritional Supplements (ESTROVEN PO) Take 1 tablet by mouth.    Marland Kitchen omeprazole (PRILOSEC) 20 MG capsule Take 20 mg by mouth daily.    Marland Kitchen oxcarbazepine (TRILEPTAL) 600 MG tablet Take 1 tablet (600 mg total) by mouth 2 (two) times daily. 180 tablet 3  . vitamin B-12 (CYANOCOBALAMIN) 1000 MCG tablet Take 1 tablet (1,000 mcg total) by mouth daily. 30 tablet 5    No facility-administered medications prior to visit.      Past Medical History:  Diagnosis Date  . Chronic insomnia 03/07/2015  . Diverticulitis   . GERD (gastroesophageal reflux disease)   . Hiatal hernia   . LBBB (left bundle branch block)   . OSA on CPAP   . Panic disorder 09/07/2014  . Seizures ([place])   . Wrist tendonitis    R      Past Surgical History:  Procedure Laterality Date  . EP IMPLANTABLE DEVICE N/A 12/13/2014   Procedure: Pacemaker Implant;  Surgeon: Deboraha Sprang, MD;  Location: [facility];  Service: Cardiovascular;  Laterality: N/A;  . HAND SURGERY    . TUBAL LIGATION       Family History  Problem Relation Age of Onset  . Alzheimer's disease Mother   . Hypertension Mother   . Hypertension Father   . Stroke Father   . Kidney failure Sister   . Diabetes Sister   . Coronary artery disease Maternal Grandfather   . Parkinson's disease Maternal Grandmother   . Stroke Brother   . Hypertension Brother   . Sleep apnea Brother      Social History   Social History  . Marital status: Single    Spouse name:  N/A  . Number of children: 0  . Years of education: 14   Occupational History  . Not on file.   Social History Main Topics  . Smoking status: Current Every Day Smoker    Packs/day: 0.25    Years: 35.00    Types: Cigarettes  . Smokeless tobacco: Never Used  . Alcohol use No     Comment: Quit 12/06/2009  . Drug use: No  . Sexual activity: Not on file   Other Topics Concern  . Not on file   Social History Narrative   Patient drinks 24 oz of soda daily.   Lives with sister   Some college   Left handed         Review of Systems  Constitutional: Denies fever, chills, fatigue, or significant weight gain/loss. HENT: Head: Denies headache or neck pain Ears: Denies changes in hearing, ringing in ears, earache, drainage Nose: Denies discharge, stuffiness, itching, nosebleed, sinus pain Throat: Denies sore throat, hoarseness, dry  mouth, sores, thrush Eyes: Denies loss/changes in vision, pain, redness, blurry/double vision, flashing lights Cardiovascular: Denies chest pain/discomfort, tightness, palpitations, shortness of breath with activity, difficulty lying down, swelling, sudden awakening with shortness of breath Respiratory: Denies shortness of breath, cough, sputum production, wheezing Gastrointestinal: Denies dysphasia, heartburn, change in appetite, nausea, change in bowel habits, rectal bleeding, constipation, diarrhea, yellow skin or eyes Genitourinary: Denies frequency, urgency, burning/pain, blood in urine, incontinence, change in urinary strength. Musculoskeletal: Denies muscle/joint pain, stiffness, back pain, redness or swelling of joints, trauma Skin: Denies rashes, lumps, itching, dryness, color changes, or hair/nail changes Neurological: Denies dizziness, fainting, seizures, weakness, numbness, tingling, tremor Psychiatric - Denies nervousness, stress, depression or memory loss Endocrine: Denies heat or cold intolerance, sweating, frequent urination, excessive thirst, changes in appetite Hematologic: Denies ease of bruising or bleeding     Objective:     BP (!) 142/84 (BP Location: Left Arm, Patient Position: Sitting, Cuff Size: Normal)   Pulse 77   Temp 98.1 F (36.7 C) (Oral)   Resp 16   Ht 5\' 2"  (1.575 m)   Wt 186 lb (84.4 kg)   SpO2 95%   BMI 34.02 kg/m  Nursing note and vital signs reviewed.  Physical Exam  Constitutional: She is oriented to person, place, and time. She appears well-developed and well-nourished.  HENT:  Head: Normocephalic.  Right Ear: Hearing, tympanic membrane, external ear and ear canal normal.  Left Ear: Hearing, tympanic membrane, external ear and ear canal normal.  Nose: Nose normal.  Mouth/Throat: Uvula is midline, oropharynx is clear and moist and mucous membranes are normal.  Eyes: Conjunctivae and EOM are normal. Pupils are equal, round, and reactive to  light.  Neck: Neck supple. No JVD present. No tracheal deviation present. No thyromegaly present.  Cardiovascular: Normal rate, regular rhythm, normal heart sounds and intact distal pulses.   Pulmonary/Chest: Effort normal and breath sounds normal.  Abdominal: Soft. Bowel sounds are normal. She exhibits no distension and no mass. There is no tenderness. There is no rebound and no guarding.  Musculoskeletal: Normal range of motion. She exhibits no edema or tenderness.  Lymphadenopathy:    She has no cervical adenopathy.  Neurological: She is alert and oriented to person, place, and time. She has normal reflexes. No cranial nerve deficit. She exhibits normal muscle tone. Coordination normal.  Skin: Skin is warm and dry.  Psychiatric: She has a normal mood and affect. Her behavior is normal. Judgment and thought content normal.  Assessment & Plan:   Problem List Items Addressed This Visit      Other   Tobacco abuse    Tobacco use with no interest in quitting at this time. Discussed importance of reducing tobacco use to prevent cardiovascular, respiratory, and malignant diseases in the future. Tobacco cessation information provided. Continue to monitor.      Seizures ([place])    Grand mal seizures appear adequately controlled with current regimen and no adverse side effects. No recent seizures. Continue current dosage of Lamictal and Trileptal with changes per neurology.      Routine general medical examination at a health care facility - Primary    1) Anticipatory Guidance: Discussed importance of wearing a seatbelt while driving and not texting while driving; changing batteries in smoke detector at least once annually; wearing suntan lotion when outside; eating a balanced and moderate diet; getting physical activity at least 30 minutes per day.  2) Immunizations / Screenings / Labs:  All immunizations are up to date per recommendations. Due for a cervical cancer screening with  referral to gynecology placed. Due for breast cancer screening with orders for mammogram placed. Due for colon cancer screening with referral to colon cancer screenings. All other screenings are up to date per recommendations. Obtain lipid profile and Vitamin D.   Overall well exam with risk factors for cardiovascular disease including obesity and tobacco use. Recommend weight loss of 5-10% of current body weight through nutrition and physical activity. Discussed importance of tobacco cessation to reduce risk for cardiovascular disease, respiratory disease, and malignancies in the future. She is not ready to quit smoking at this time. Continue other healthy lifestyle behaviors and choices. Follow-up prevention exam in 1 year. Follow-up office visit pending blood work for chronic conditions as needed.       Relevant Orders   Lipid Profile (Completed)   Vitamin D (25 hydroxy) (Completed)   Obesity    BMI indicates obesity.Recommend weight loss of 5-10% of current body weight. Recommend increasing physical activity to 30 minutes of moderate level activity daily. Encourage nutritional intake that focuses on nutrient dense foods and is moderate, varied, and balanced and is low in saturated fats and processed/sugary foods. Continue to monitor.         Other Visit Diagnoses    Colon cancer screening       Relevant Orders   Ambulatory referral to Gastroenterology   Encounter for screening mammogram for breast cancer       Relevant Orders   MM DIGITAL SCREENING BILATERAL   Cervical cancer screening       Relevant Orders   Ambulatory referral to Gynecology       I am having Ms. Willits maintain her omeprazole, ibuprofen, vitamin B-12, Nutritional Supplements (ESTROVEN PO), lamoTRIgine, and oxcarbazepine.   Follow-up: Return if symptoms worsen or fail to improve.   Mauricio Po, FNP

## 2016-03-02 NOTE — Assessment & Plan Note (Signed)
Tobacco use with no interest in quitting at this time. Discussed importance of reducing tobacco use to prevent cardiovascular, respiratory, and malignant diseases in the future. Tobacco cessation information provided. Continue to monitor.

## 2016-03-02 NOTE — Assessment & Plan Note (Signed)
1) Anticipatory Guidance: Discussed importance of wearing a seatbelt while driving and not texting while driving; changing batteries in smoke detector at least once annually; wearing suntan lotion when outside; eating a balanced and moderate diet; getting physical activity at least 30 minutes per day.  2) Immunizations / Screenings / Labs:  All immunizations are up to date per recommendations. Due for a cervical cancer screening with referral to gynecology placed. Due for breast cancer screening with orders for mammogram placed. Due for colon cancer screening with referral to colon cancer screenings. All other screenings are up to date per recommendations. Obtain lipid profile and Vitamin D.   Overall well exam with risk factors for cardiovascular disease including obesity and tobacco use. Recommend weight loss of 5-10% of current body weight through nutrition and physical activity. Discussed importance of tobacco cessation to reduce risk for cardiovascular disease, respiratory disease, and malignancies in the future. She is not ready to quit smoking at this time. Continue other healthy lifestyle behaviors and choices. Follow-up prevention exam in 1 year. Follow-up office visit pending blood work for chronic conditions as needed.

## 2016-03-06 MED ORDER — ATORVASTATIN CALCIUM 20 MG PO TABS: 20.0000 mg | ORAL_TABLET | Freq: Every day | ORAL | 0 refills | Status: DC

## 2016-03-06 MED ORDER — VITAMIN D3 50 MCG (2000 UT) PO CAPS: 2000.0000 [IU] | ORAL_CAPSULE | Freq: Every day | ORAL | 0 refills | Status: DC

## 2016-03-12 ENCOUNTER — Encounter: Payer: Self-pay | Admitting: Family

## 2016-03-16 MED ORDER — ATORVASTATIN CALCIUM 20 MG PO TABS: 20.0000 mg | ORAL_TABLET | Freq: Every day | ORAL | 0 refills | Status: DC

## 2016-03-16 MED ORDER — VITAMIN D3 50 MCG (2000 UT) PO CAPS: 2000.0000 [IU] | ORAL_CAPSULE | Freq: Every day | ORAL | 0 refills | Status: DC

## 2016-03-17 ENCOUNTER — Other Ambulatory Visit: Payer: Self-pay

## 2016-03-17 MED ORDER — VITAMIN D3 50 MCG (2000 UT) PO CAPS: 2000.0000 [IU] | ORAL_CAPSULE | Freq: Every day | ORAL | 0 refills | Status: AC

## 2016-03-17 MED ORDER — ATORVASTATIN CALCIUM 20 MG PO TABS: 20.0000 mg | ORAL_TABLET | Freq: Every day | ORAL | 0 refills | Status: DC

## 2016-03-18 ENCOUNTER — Ambulatory Visit (INDEPENDENT_AMBULATORY_CARE_PROVIDER_SITE_OTHER): Payer: No Typology Code available for payment source | Admitting: Nurse Practitioner

## 2016-03-18 ENCOUNTER — Encounter: Payer: Self-pay | Admitting: Nurse Practitioner

## 2016-03-18 VITALS — BP 132/74 | HR 84 | Ht 62.0 in | Wt 186.0 lb

## 2016-03-18 DIAGNOSIS — Z1211 Encounter for screening for malignant neoplasm of colon: Secondary | ICD-10-CM

## 2016-03-18 MED ORDER — NA SULFATE-K SULFATE-MG SULF 17.5-3.13-1.6 GM/177ML PO SOLN: 1.0000 | Freq: Once | ORAL | 0 refills | Status: AC

## 2016-03-18 MED ORDER — NA SULFATE-K SULFATE-MG SULF 17.5-3.13-1.6 GM/177ML PO SOLN: 1.0000 | Freq: Once | ORAL | 0 refills | Status: DC

## 2016-03-18 NOTE — Progress Notes (Signed)
HPI: Patient is 55 year old female referred by PCP Mauricio Po, FNP for a colon cancer screening. She has a very remote history with Dr. Delfin Edis . Patient has no abdominal pain or bowel changes. No rectal bleeding. Her last colonosocpy was in [place], [place] in 2003 which she thinks was normal.    Past Medical History:  Diagnosis Date  . Chronic insomnia 03/07/2015  . Diverticulitis   . GERD (gastroesophageal reflux disease)   . Hiatal hernia   . LBBB (left bundle branch block)   . OSA on CPAP   . Panic disorder 09/07/2014  . Seizures ([place])   . Wrist tendonitis    R     Past Surgical History:  Procedure Laterality Date  . EP IMPLANTABLE DEVICE N/A 12/13/2014   Procedure: Pacemaker Implant;  Surgeon: Deboraha Sprang, MD;  Location: [facility];  Service: Cardiovascular;  Laterality: N/A;  . HAND SURGERY    . TUBAL LIGATION     Family History  Problem Relation Age of Onset  . Alzheimer's disease Mother   . Hypertension Mother   . Hypertension Father   . Stroke Father   . Kidney failure Sister   . Diabetes Sister   . Coronary artery disease Maternal Grandfather   . Parkinson's disease Maternal Grandmother   . Stroke Brother   . Hypertension Brother   . Sleep apnea Brother    Social History  Substance Use Topics  . Smoking status: Current Every Day Smoker    Packs/day: 0.25    Years: 35.00    Types: Cigarettes  . Smokeless tobacco: Never Used  . Alcohol use No     Comment: Quit 12/06/2009   Current Outpatient Prescriptions  Medication Sig Dispense Refill  . atorvastatin (LIPITOR) 20 MG tablet Take 1 tablet (20 mg total) by mouth daily. 90 tablet 0  . Cholecalciferol (VITAMIN D3) 2000 units capsule Take 1 capsule (2,000 Units total) by mouth daily. 90 capsule 0  . ibuprofen (ADVIL,MOTRIN) 200 MG tablet Take 200 mg by mouth every 6 (six) hours as needed for mild pain or moderate pain. Liquid-Gels    . lamoTRIgine (LAMICTAL) 100 MG tablet Take 1.5  tablets (150 mg total) by mouth 2 (two) times daily. 270 tablet 3  . Nutritional Supplements (ESTROVEN PO) Take 1 tablet by mouth.    Marland Kitchen omeprazole (PRILOSEC) 20 MG capsule Take 20 mg by mouth daily.    Marland Kitchen oxcarbazepine (TRILEPTAL) 600 MG tablet Take 1 tablet (600 mg total) by mouth 2 (two) times daily. 180 tablet 3  . vitamin B-12 (CYANOCOBALAMIN) 1000 MCG tablet Take 1 tablet (1,000 mcg total) by mouth daily. 30 tablet 5   No current facility-administered medications for this visit.    Allergies  Allergen Reactions  . Zonisamide Anxiety    Panic Attacks  . Aspirin     sensitivity  . Ciprofloxacin     Seizures & it keeps her awake  . Codeine Hives  . Hydrocodone Hives  . Oxycodone Hives  . Penicillins Hives     Review of Systems: Positive for allergy, sinus problems, arthritis, vision changes, cough, headaches, night sweats, sleeping problems and swelling of feet and legs. All other systems reviewed and negative except where noted in HPI.   Physical Exam: BP 132/74   Pulse 84   Ht 5\' 2"  (1.575 m)   Wt 186 lb (84.4 kg)   BMI 34.02 kg/m  Constitutional: Pleasant,well-developed, white female in no acute distress.  HEENT: Normocephalic and atraumatic. Conjunctivae are normal. No scleral icterus. Neck supple.  Cardiovascular: Normal rate, regular rhythm.  Pulmonary/chest: Effort normal and breath sounds normal. No wheezing, rales or rhonchi. Abdominal: Soft, nondistended, nontender. Bowel sounds active throughout. There are no masses palpable. No hepatomegaly. Extremities: no edema Lymphadenopathy: No cervical adenopathy noted. Neurological: Alert and oriented to person place and time. Skin: Skin is warm and dry. No rashes noted. Psychiatric: Normal mood and affect. Behavior is normal.   ASSESSMENT AND PLAN:  Colon cancer screening. Patient will be scheduled for a screening colonoscopy with possible polypectomy. The risks and benefits of the procedure were discussed and the  patient agrees to proceed.   Hx of complete block, s/p Pacemaker. Echo July 2016 EF 55-60%  Hx of grade I diastolic dysfunction  Hx of seizures, controlled on Trileptal. Last seizure July 2106  Golden Circle, FNP

## 2016-03-18 NOTE — Patient Instructions (Addendum)
You have been scheduled for a colonoscopy. Please follow written instructions given to you at your visit today.  Please pick up your prep supplies at the pharmacy within the next 1-3 days. [employer]. If you use inhalers (even only as needed), please bring them with you on the day of your procedure. Your physician has requested that you go to www.startemmi.com and enter the access code given to you at your visit today. This web site gives a general overview about your procedure. However, you should still follow specific instructions given to you by our office regarding your preparation for the procedure.

## 2016-03-19 ENCOUNTER — Telehealth: Payer: Self-pay | Admitting: *Deleted

## 2016-03-19 ENCOUNTER — Telehealth: Payer: Self-pay | Admitting: Gastroenterology

## 2016-03-19 NOTE — Telephone Encounter (Signed)
Called the patient and advised we got a few samples in of the colonoscopy prep. We put one at the front desk for her to pick up. I asked if she could please pick it up by tomorrow 10-13 before 5:00 am.  She said she would and was grateful for the sample.

## 2016-03-25 NOTE — Telephone Encounter (Signed)
See note dated 03-19-2016.

## 2016-03-27 ENCOUNTER — Other Ambulatory Visit: Payer: Self-pay | Admitting: Family

## 2016-03-27 MED ORDER — ATORVASTATIN CALCIUM 20 MG PO TABS: 20.0000 mg | ORAL_TABLET | Freq: Every day | ORAL | 0 refills | Status: DC

## 2016-03-28 ENCOUNTER — Encounter: Payer: Self-pay | Admitting: Family

## 2016-03-29 ENCOUNTER — Encounter: Payer: Self-pay | Admitting: Adult Health

## 2016-03-30 NOTE — Progress Notes (Signed)
Reviewed and agree with documentation and assessment and plan. K. Veena Vallie Fayette , MD   

## 2016-03-31 ENCOUNTER — Encounter: Payer: Self-pay | Admitting: Gastroenterology

## 2016-03-31 ENCOUNTER — Ambulatory Visit (AMBULATORY_SURGERY_CENTER): Payer: Self-pay | Admitting: Gastroenterology

## 2016-03-31 VITALS — BP 148/82 | HR 73 | Temp 98.7°F | Resp 14 | Ht 62.0 in | Wt 186.0 lb

## 2016-03-31 DIAGNOSIS — K635 Polyp of colon: Secondary | ICD-10-CM

## 2016-03-31 DIAGNOSIS — D120 Benign neoplasm of cecum: Secondary | ICD-10-CM

## 2016-03-31 DIAGNOSIS — Z1211 Encounter for screening for malignant neoplasm of colon: Secondary | ICD-10-CM

## 2016-03-31 DIAGNOSIS — D125 Benign neoplasm of sigmoid colon: Secondary | ICD-10-CM

## 2016-03-31 DIAGNOSIS — D124 Benign neoplasm of descending colon: Secondary | ICD-10-CM

## 2016-03-31 DIAGNOSIS — Z1212 Encounter for screening for malignant neoplasm of rectum: Secondary | ICD-10-CM

## 2016-03-31 DIAGNOSIS — D121 Benign neoplasm of appendix: Secondary | ICD-10-CM

## 2016-03-31 DIAGNOSIS — D122 Benign neoplasm of ascending colon: Secondary | ICD-10-CM

## 2016-03-31 MED ORDER — SODIUM CHLORIDE 0.9 % IV SOLN: 500.0000 mL | INTRAVENOUS | Status: DC

## 2016-03-31 NOTE — Progress Notes (Signed)
No problems noted in the recovery room. Maw  Pt stayed 1:1 capture paced rhythm in the recovery room.  Strip was printed.  maw

## 2016-03-31 NOTE — Patient Instructions (Signed)
YOU HAD AN ENDOSCOPIC PROCEDURE TODAY AT [facility]:   Refer to the procedure report that was given to you for any specific questions about what was found during the examination.  If the procedure report does not answer your questions, please call your gastroenterologist to clarify.  If you requested that your care partner not be given the details of your procedure findings, then the procedure report has been included in a sealed envelope for you to review at your convenience later.  YOU SHOULD EXPECT: Some feelings of bloating in the abdomen. Passage of more gas than usual.  Walking can help get rid of the air that was put into your GI tract during the procedure and reduce the bloating. If you had a lower endoscopy (such as a colonoscopy or flexible sigmoidoscopy) you may notice spotting of blood in your stool or on the toilet paper. If you underwent a bowel prep for your procedure, you may not have a normal bowel movement for a few days.  Please Note:  You might notice some irritation and congestion in your nose or some drainage.  This is from the oxygen used during your procedure.  There is no need for concern and it should clear up in a day or so.  SYMPTOMS TO REPORT IMMEDIATELY:   Following lower endoscopy (colonoscopy or flexible sigmoidoscopy):  Excessive amounts of blood in the stool  Significant tenderness or worsening of abdominal pains  Swelling of the abdomen that is new, acute  Fever of 100F or higher   Following upper endoscopy (EGD)  Vomiting of blood or coffee ground material  New chest pain or pain under the shoulder blades  Painful or persistently difficult swallowing  New shortness of breath  Fever of 100F or higher  Black, tarry-looking stools  For urgent or emergent issues, a gastroenterologist can be reached at any hour by calling [phone].   DIET:  We do recommend a small meal at first, but then you may proceed to your regular diet.  Drink  plenty of fluids but you should avoid alcoholic beverages for 24 hours.  ACTIVITY:  You should plan to take it easy for the rest of today and you should NOT DRIVE or use heavy machinery until tomorrow (because of the sedation medicines used during the test).    FOLLOW UP: Our staff will call the number listed on your records the next business day following your procedure to check on you and address any questions or concerns that you may have regarding the information given to you following your procedure. If we do not reach you, we will leave a message.  However, if you are feeling well and you are not experiencing any problems, there is no need to return our call.  We will assume that you have returned to your regular daily activities without incident.  If any biopsies were taken you will be contacted by phone or by letter within the next 1-3 weeks.  Please call us at [phone] if you have not heard about the biopsies in 3 weeks.    SIGNATURES/CONFIDENTIALITY: You and/or your care partner have signed paperwork which will be entered into your electronic medical record.  These signatures attest to the fact that that the information above on your After Visit Summary has been reviewed and is understood.  Full responsibility of the confidentiality of this discharge information lies with you and/or your care-partner.    Handouts were given to your care partner on polyps  and hemorrhoids. You may resume your current medications today. Await biopsy results. Please call if any questions or concerns.

## 2016-03-31 NOTE — Progress Notes (Signed)
Report to PACU, RN, vss, BBS= Clear.  

## 2016-03-31 NOTE — Progress Notes (Signed)
Called to room to assist during endoscopic procedure.  Patient ID and intended procedure confirmed with present staff. Received instructions for my participation in the procedure from the performing physician.  

## 2016-03-31 NOTE — Op Note (Addendum)
[facility] Patient Name: [name] [name] Procedure Date: 03/31/2016 9:54 AM MRN: [id number] Endoscopist: Mauri Pole , MD Age: 55 Referring MD:  Date of Birth: 1961-05-13 Gender: Female Account #: [id number] Procedure:                Colonoscopy Indications:              Screening for colorectal malignant neoplasm Medicines:                Monitored Anesthesia Care Procedure:                Pre-Anesthesia Assessment:                           - Prior to the procedure, a History and Physical                            was performed, and patient medications and                            allergies were reviewed. The patient's tolerance of                            previous anesthesia was also reviewed. The risks                            and benefits of the procedure and the sedation                            options and risks were discussed with the patient.                            All questions were answered, and informed consent                            was obtained. Prior Anticoagulants: The patient has                            taken no previous anticoagulant or antiplatelet                            agents. ASA Grade Assessment: II - A patient with                            mild systemic disease. After reviewing the risks                            and benefits, the patient was deemed in                            satisfactory condition to undergo the procedure.                           After obtaining informed consent, the colonoscope  was passed under direct vision. Throughout the                            procedure, the patient's blood pressure, pulse, and                            oxygen saturations were monitored continuously. The                            Model CF-HQ190L [id number]) scope was introduced                            through the anus and advanced to the the terminal                            ileum,  with identification of the appendiceal                            orifice and IC valve. The colonoscopy was performed                            without difficulty. The patient tolerated the                            procedure well. The quality of the bowel                            preparation was excellent. The terminal ileum,                            ileocecal valve, appendiceal orifice, and rectum                            were photographed. Scope In: 10:04:04 AM Scope Out: 10:23:38 AM Scope Withdrawal Time: 0 hours 11 minutes 21 seconds  Total Procedure Duration: 0 hours 19 minutes 34 seconds  Findings:                 The perianal and digital rectal examinations were                            normal.                           Four sessile polyps were found in the sigmoid colon                            and descending colon. The polyps were 5 to 7 mm in                            size. These polyps were removed with a cold snare.                            Resection and retrieval were complete.  A 4 mm polyp was found in the ascending colon. The                            polyp was flat. The polyp was removed with a cold                            biopsy forceps. Resection and retrieval were                            complete.                           A 6 mm polyp was found in the appendiceal orifice.                            The polyp was granular lateral spreading. Biopsies                            were taken with a cold forceps for histology.                           Non-bleeding internal hemorrhoids were found during                            retroflexion. The hemorrhoids were small. Complications:            No immediate complications. Estimated Blood Loss:     Estimated blood loss was minimal. Impression:               - Four 5 to 7 mm polyps in the sigmoid colon and in                            the descending colon, removed with a cold  snare.                            Resected and retrieved.                           - One 4 mm polyp in the ascending colon, removed                            with a cold biopsy forceps. Resected and retrieved.                           - One 6 mm polyp at the appendiceal orifice.                            Biopsied. Unable to remove as was extending into                            the appendiceal orifice                           - Non-bleeding internal hemorrhoids.  Recommendation:           - Patient has a contact number available for                            emergencies. The signs and symptoms of potential                            delayed complications were discussed with the                            patient. Return to normal activities tomorrow.                            Written discharge instructions were provided to the                            patient.                           - Resume previous diet.                           - Continue present medications.                           - Await pathology results.                           - Repeat colonoscopy in 3 years for surveillance.                           - Refer to a surgeon for extended                            appendectomy/cecectomy based on pathology for                            appendiceal polyp removal at the next available                            appointment. Mauri Pole, MD 03/31/2016 10:31:11 AM This report has been signed electronically.

## 2016-04-01 ENCOUNTER — Telehealth: Payer: Self-pay | Admitting: *Deleted

## 2016-04-01 NOTE — Telephone Encounter (Signed)
  Follow up Call-  Call back number 03/31/2016  Post procedure Call Back phone  # [phone]  Permission to leave phone message Yes  Some recent data might be hidden     Patient questions:  Do you have a fever, pain , or abdominal swelling? No. Pain Score  0 *  Have you tolerated food without any problems? Yes.    Have you been able to return to your normal activities? Yes.    Do you have any questions about your discharge instructions: Diet   No. Medications  No. Follow up visit  No.  Do you have questions or concerns about your Care? No.  Actions: * If pain score is 4 or above: No action needed, pain <4.

## 2016-04-05 ENCOUNTER — Encounter: Payer: Self-pay | Admitting: Gastroenterology

## 2016-04-07 ENCOUNTER — Ambulatory Visit (INDEPENDENT_AMBULATORY_CARE_PROVIDER_SITE_OTHER): Payer: No Typology Code available for payment source | Admitting: *Deleted

## 2016-04-07 ENCOUNTER — Telehealth: Payer: Self-pay | Admitting: Cardiology

## 2016-04-07 DIAGNOSIS — I442 Atrioventricular block, complete: Secondary | ICD-10-CM

## 2016-04-07 NOTE — Telephone Encounter (Signed)
Spoke with pt and reminded pt of remote transmission that is due today. Pt verbalized understanding.   

## 2016-04-08 NOTE — Progress Notes (Signed)
Remote pacemaker transmission.   

## 2016-04-09 ENCOUNTER — Telehealth: Payer: Self-pay | Admitting: Gastroenterology

## 2016-04-09 NOTE — Telephone Encounter (Signed)
Patient agrees to surgical referral to [facility].

## 2016-04-10 NOTE — Telephone Encounter (Signed)
I have contacted General Surgery at [facility]. The financial assistance program will contact the patient. Once she completes her application, she will be given an appointment for general surgery.  I have spoken with the patient and advised her of the process. Also advised her to follow through in a timely manner, that the longer she waits, the greater the chance the polyp could grow causing her to need more surgery than an appendectomy.

## 2016-04-15 ENCOUNTER — Encounter: Payer: Self-pay | Admitting: Cardiology

## 2016-05-04 ENCOUNTER — Ambulatory Visit (INDEPENDENT_AMBULATORY_CARE_PROVIDER_SITE_OTHER): Payer: Self-pay | Admitting: Neurology

## 2016-05-04 ENCOUNTER — Other Ambulatory Visit: Payer: Self-pay

## 2016-05-04 ENCOUNTER — Encounter: Payer: Self-pay | Admitting: Neurology

## 2016-05-04 VITALS — BP 137/80 | HR 88 | Ht 62.0 in | Wt 182.8 lb

## 2016-05-04 DIAGNOSIS — R569 Unspecified convulsions: Secondary | ICD-10-CM

## 2016-05-04 MED ORDER — LAMOTRIGINE 150 MG PO TABS: 150.0000 mg | ORAL_TABLET | Freq: Every day | ORAL | 3 refills | Status: DC

## 2016-05-04 MED ORDER — OXCARBAZEPINE 600 MG PO TABS: ORAL_TABLET | ORAL | 3 refills | Status: DC

## 2016-05-04 NOTE — Telephone Encounter (Signed)
Received fax from pt's pharmacy requesting new Trileptal rx w/ change in dose. Called and spoke w/ pt. Appears that she received printed scripts at her OV today and will need to take them to pharmacy to have filled w/ updated dosing instructions. Verbalized understanding and appreciation for call.

## 2016-05-04 NOTE — Progress Notes (Signed)
Reason for visit: Seizures  [name] [name] is an 55 y.o. female  History of present illness:  [name] [name] is a 55 year old left-handed white female with a history of seizure-type events. The patient has had a pacemaker placed, she has not had any actual blackout events since that time. The patient has reported episodes of tremors or myoclonus involving arms, the last event occurred in August 2017. When the events will occur, patient does not lose consciousness even though both sides of the body are involved. The patient has been on a combination of Lamictal and Trileptal, she has had some problems with cognitive slowing, staggery gait, but no falls. The patient may have some word finding problems as well. The patient has been out of work, she is applying for disability. She is not operating a motor vehicle at this time. Blood work done in the summer 2017 showed a therapeutic level of Lamictal and Trileptal, but the Trileptal level was in the upper limits of therapeutic. The patient reports occasional episodes of sharp pain lasting one or 2 minutes that may migrate about the head, occurring about once a week on average.  Past Medical History:  Diagnosis Date  . Chronic insomnia 03/07/2015  . Diverticulitis   . GERD (gastroesophageal reflux disease)   . Hiatal hernia   . LBBB (left bundle branch block)   . OSA on CPAP   . Panic disorder 09/07/2014  . Seizures ([place])   . Wrist tendonitis    R     Past Surgical History:  Procedure Laterality Date  . COLONOSCOPY    . EP IMPLANTABLE DEVICE N/A 12/13/2014   Procedure: Pacemaker Implant;  Surgeon: Deboraha Sprang, MD;  Location: [facility];  Service: Cardiovascular;  Laterality: N/A;  . HAND SURGERY    . TUBAL LIGATION      Family History  Problem Relation Age of Onset  . Alzheimer's disease Mother   . Hypertension Mother   . Hypertension Father   . Stroke Father   . Kidney failure Sister   . Diabetes Sister   . Coronary  artery disease Maternal Grandfather   . Parkinson's disease Maternal Grandmother   . Stroke Brother   . Hypertension Brother   . Sleep apnea Brother     Social history:  reports that she has been smoking Cigarettes.  She has a 8.75 pack-year smoking history. She has never used smokeless tobacco. She reports that she does not drink alcohol or use drugs.    Allergies  Allergen Reactions  . Zonisamide Anxiety    Panic Attacks  . Aspirin     sensitivity  . Ciprofloxacin     Seizures & it keeps her awake  . Codeine Hives  . Hydrocodone Hives  . Oxycodone Hives  . Penicillins Hives    Medications:  Prior to Admission medications   Medication Sig Start Date End Date Taking? Authorizing Provider  atorvastatin (LIPITOR) 20 MG tablet Take 1 tablet (20 mg total) by mouth daily. 03/27/16  Yes Golden Circle, FNP  Cholecalciferol (VITAMIN D3) 2000 units capsule Take 1 capsule (2,000 Units total) by mouth daily. 03/17/16  Yes Golden Circle, FNP  ibuprofen (ADVIL,MOTRIN) 200 MG tablet Take 200 mg by mouth every 6 (six) hours as needed for mild pain or moderate pain. Liquid-Gels   Yes Historical Provider, MD  lamoTRIgine (LAMICTAL) 100 MG tablet Take 1.5 tablets (150 mg total) by mouth 2 (two) times daily. 01/21/16  Yes Elon Alas  Jannifer Franklin, MD  Nutritional Supplements (ESTROVEN PO) Take 1 tablet by mouth.   Yes Historical Provider, MD  omeprazole (PRILOSEC) 20 MG capsule Take 20 mg by mouth daily.   Yes Historical Provider, MD  oxcarbazepine (TRILEPTAL) 600 MG tablet Take 1 tablet (600 mg total) by mouth 2 (two) times daily. 01/21/16  Yes Kathrynn Ducking, MD  vitamin B-12 (CYANOCOBALAMIN) 1000 MCG tablet Take 1 tablet (1,000 mcg total) by mouth daily. 07/09/15  Yes Ward Givens, NP    ROS:  Out of a complete 14 system review of symptoms, the patient complains only of the following symptoms, and all other reviewed systems are negative.  Double vision Cough Daytime sleepiness Joint pain  and walking difficulty, neck stiffness Memory loss, dizziness, headache  Blood pressure 137/80, pulse 88, height 5\' 2"  (1.575 m), weight 182 lb 12 oz (82.9 kg).  Physical Exam  General: The patient is alert and cooperative at the time of the examination.  Skin: No significant peripheral edema is noted.   Neurologic Exam  Mental status: The patient is alert and oriented x 3 at the time of the examination. The patient has apparent normal recent and remote memory, with an apparently normal attention span and concentration ability. Mini-Mental Status Examination done today shows a total score of 26/30.   Cranial nerves: Facial symmetry is present. Speech is normal, no aphasia or dysarthria is noted. Extraocular movements are full. Visual fields are full.  Motor: The patient has good strength in all 4 extremities.  Sensory examination: Soft touch sensation is symmetric on the face, arms, and legs.  Coordination: The patient has good finger-nose-finger and heel-to-shin bilaterally.  Gait and station: The patient has a normal gait. Tandem gait is normal. Romberg is negative. No drift is seen.  Reflexes: Deep tendon reflexes are symmetric.   Assessment/Plan:  1. History of seizures  2. Gait instability  3. Mild cognitive impairment  4. Ice pick pains  The patient describes an event in August 2017 of tremors in both arms without loss of consciousness, this likely does not represent a seizure. The patient could get back to driving at this point. The patient is having some signs of toxicity on Trileptal, the levels were in the upper limits of the therapeutic range. We will reduce the dose to 300 mg in the morning and 600 mg in the evening. A prescription was given for the Lamictal and for the Trileptal. The patient will follow-up in about 6 months, sooner if needed.  Jill Alexanders MD 05/04/2016 10:44 AM  [facility] [address] [place], [place]  [address]  Phone [phone] Fax [phone]

## 2016-05-04 NOTE — Patient Instructions (Signed)
   We will reduce the Trileptal 600 mg tablet to 1/2 tablet in the morning and one in the evening.

## 2016-05-07 LAB — CUP PACEART REMOTE DEVICE CHECK
Battery Remaining Longevity: 114 mo
Brady Statistic AS VP Percent: 39.73 %
Brady Statistic RA Percent Paced: 0.63 %
Date Time Interrogation Session: 20171031164251
Implantable Lead Implant Date: 20160707
Implantable Lead Location: 753860
Implantable Lead Model: 5076
Lead Channel Impedance Value: 855 Ohm
Lead Channel Pacing Threshold Pulse Width: 0.4 ms
Lead Channel Sensing Intrinsic Amplitude: 23.5 mV
Lead Channel Sensing Intrinsic Amplitude: 4.75 mV
Lead Channel Setting Pacing Pulse Width: 0.4 ms
Lead Channel Setting Sensing Sensitivity: 4 mV
MDC IDC LEAD IMPLANT DT: 20160707
MDC IDC LEAD LOCATION: 753859
MDC IDC MSMT BATTERY VOLTAGE: 3.02 V
MDC IDC MSMT LEADCHNL RA IMPEDANCE VALUE: 380 Ohm
MDC IDC MSMT LEADCHNL RA IMPEDANCE VALUE: 437 Ohm
MDC IDC MSMT LEADCHNL RA PACING THRESHOLD AMPLITUDE: 0.5 V
MDC IDC MSMT LEADCHNL RA SENSING INTR AMPL: 4.75 mV
MDC IDC MSMT LEADCHNL RV IMPEDANCE VALUE: 817 Ohm
MDC IDC MSMT LEADCHNL RV PACING THRESHOLD AMPLITUDE: 0.5 V
MDC IDC MSMT LEADCHNL RV PACING THRESHOLD PULSEWIDTH: 0.4 ms
MDC IDC MSMT LEADCHNL RV SENSING INTR AMPL: 23.5 mV
MDC IDC PG IMPLANT DT: 20160707
MDC IDC SET LEADCHNL RA PACING AMPLITUDE: 1.5 V
MDC IDC SET LEADCHNL RV PACING AMPLITUDE: 2.5 V
MDC IDC STAT BRADY AP VP PERCENT: 0.59 %
MDC IDC STAT BRADY AP VS PERCENT: 0.04 %
MDC IDC STAT BRADY AS VS PERCENT: 59.63 %
MDC IDC STAT BRADY RV PERCENT PACED: 40.33 %

## 2016-05-11 ENCOUNTER — Ambulatory Visit: Payer: Self-pay | Admitting: Neurology

## 2016-05-13 ENCOUNTER — Ambulatory Visit: Payer: No Typology Code available for payment source | Admitting: Gastroenterology

## 2016-05-21 ENCOUNTER — Encounter: Payer: Self-pay | Admitting: Nurse Practitioner

## 2016-05-28 ENCOUNTER — Other Ambulatory Visit: Payer: Self-pay | Admitting: Neurology

## 2016-05-28 MED ORDER — LAMOTRIGINE 150 MG PO TABS: 150.0000 mg | ORAL_TABLET | Freq: Two times a day (BID) | ORAL | 3 refills | Status: DC

## 2016-05-29 ENCOUNTER — Telehealth: Payer: Self-pay

## 2016-05-29 NOTE — Telephone Encounter (Signed)
Lamictal rx left at front desk.

## 2016-07-07 ENCOUNTER — Ambulatory Visit (INDEPENDENT_AMBULATORY_CARE_PROVIDER_SITE_OTHER): Payer: No Typology Code available for payment source | Admitting: *Deleted

## 2016-07-07 DIAGNOSIS — I442 Atrioventricular block, complete: Secondary | ICD-10-CM

## 2016-07-07 NOTE — Progress Notes (Signed)
Remote pacemaker transmission.   

## 2016-07-08 ENCOUNTER — Encounter: Payer: Self-pay | Admitting: Cardiology

## 2016-07-12 LAB — CUP PACEART REMOTE DEVICE CHECK
Battery Remaining Longevity: 112 mo
Brady Statistic AP VP Percent: 0.86 %
Brady Statistic AP VS Percent: 0.06 %
Brady Statistic AS VP Percent: 39.54 %
Brady Statistic RA Percent Paced: 0.92 %
Implantable Lead Implant Date: 20160707
Implantable Lead Location: 753860
Implantable Lead Model: 5076
Lead Channel Impedance Value: 380 Ohm
Lead Channel Pacing Threshold Pulse Width: 0.4 ms
Lead Channel Pacing Threshold Pulse Width: 0.4 ms
Lead Channel Sensing Intrinsic Amplitude: 21.375 mV
Lead Channel Sensing Intrinsic Amplitude: 4.5 mV
Lead Channel Sensing Intrinsic Amplitude: 4.5 mV
Lead Channel Setting Pacing Pulse Width: 0.4 ms
MDC IDC LEAD IMPLANT DT: 20160707
MDC IDC LEAD LOCATION: 753859
MDC IDC MSMT BATTERY VOLTAGE: 3.02 V
MDC IDC MSMT LEADCHNL RA IMPEDANCE VALUE: 399 Ohm
MDC IDC MSMT LEADCHNL RA PACING THRESHOLD AMPLITUDE: 0.5 V
MDC IDC MSMT LEADCHNL RV IMPEDANCE VALUE: 798 Ohm
MDC IDC MSMT LEADCHNL RV IMPEDANCE VALUE: 836 Ohm
MDC IDC MSMT LEADCHNL RV PACING THRESHOLD AMPLITUDE: 0.5 V
MDC IDC MSMT LEADCHNL RV SENSING INTR AMPL: 21.375 mV
MDC IDC PG IMPLANT DT: 20160707
MDC IDC SESS DTM: 20180130162906
MDC IDC SET LEADCHNL RA PACING AMPLITUDE: 1.5 V
MDC IDC SET LEADCHNL RV PACING AMPLITUDE: 2.5 V
MDC IDC SET LEADCHNL RV SENSING SENSITIVITY: 4 mV
MDC IDC STAT BRADY AS VS PERCENT: 59.53 %
MDC IDC STAT BRADY RV PERCENT PACED: 40.42 %

## 2016-10-05 ENCOUNTER — Other Ambulatory Visit: Payer: Self-pay | Admitting: Family

## 2016-10-05 MED ORDER — ATORVASTATIN CALCIUM 20 MG PO TABS: 20.0000 mg | ORAL_TABLET | Freq: Every day | ORAL | 1 refills | Status: DC

## 2016-10-05 NOTE — Telephone Encounter (Signed)
Printed script faxed back to [facility] manually...[name] [name]

## 2016-10-06 ENCOUNTER — Ambulatory Visit (INDEPENDENT_AMBULATORY_CARE_PROVIDER_SITE_OTHER): Payer: Self-pay | Admitting: *Deleted

## 2016-10-06 DIAGNOSIS — I442 Atrioventricular block, complete: Secondary | ICD-10-CM

## 2016-10-06 NOTE — Progress Notes (Signed)
Remote pacemaker transmission.   

## 2016-10-07 ENCOUNTER — Encounter: Payer: Self-pay | Admitting: Cardiology

## 2016-10-07 LAB — CUP PACEART REMOTE DEVICE CHECK
Battery Remaining Longevity: 103 mo
Battery Voltage: 3.02 V
Brady Statistic AP VP Percent: 0.79 %
Brady Statistic AS VS Percent: 57.15 %
Brady Statistic RV Percent Paced: 42.84 %
Implantable Lead Implant Date: 20160707
Implantable Lead Implant Date: 20160707
Implantable Lead Model: 5076
Implantable Pulse Generator Implant Date: 20160707
Lead Channel Impedance Value: 380 Ohm
Lead Channel Impedance Value: 399 Ohm
Lead Channel Impedance Value: 874 Ohm
Lead Channel Pacing Threshold Amplitude: 0.5 V
Lead Channel Pacing Threshold Amplitude: 0.5 V
Lead Channel Pacing Threshold Pulse Width: 0.4 ms
Lead Channel Sensing Intrinsic Amplitude: 3.75 mV
Lead Channel Setting Sensing Sensitivity: 4 mV
MDC IDC LEAD LOCATION: 753859
MDC IDC LEAD LOCATION: 753860
MDC IDC MSMT LEADCHNL RA PACING THRESHOLD PULSEWIDTH: 0.4 ms
MDC IDC MSMT LEADCHNL RA SENSING INTR AMPL: 3.75 mV
MDC IDC MSMT LEADCHNL RV IMPEDANCE VALUE: 836 Ohm
MDC IDC MSMT LEADCHNL RV SENSING INTR AMPL: 22 mV
MDC IDC MSMT LEADCHNL RV SENSING INTR AMPL: 22 mV
MDC IDC SESS DTM: 20180501135539
MDC IDC SET LEADCHNL RA PACING AMPLITUDE: 1.5 V
MDC IDC SET LEADCHNL RV PACING AMPLITUDE: 2.5 V
MDC IDC SET LEADCHNL RV PACING PULSEWIDTH: 0.4 ms
MDC IDC STAT BRADY AP VS PERCENT: 0.03 %
MDC IDC STAT BRADY AS VP PERCENT: 42.03 %
MDC IDC STAT BRADY RA PERCENT PACED: 0.83 %

## 2016-11-03 ENCOUNTER — Ambulatory Visit: Payer: No Typology Code available for payment source | Admitting: Adult Health

## 2016-11-04 ENCOUNTER — Encounter: Payer: Self-pay | Admitting: Adult Health

## 2016-11-05 ENCOUNTER — Encounter: Payer: Self-pay | Admitting: Adult Health

## 2016-11-05 NOTE — Telephone Encounter (Signed)
Angie,   Please note on the patient's chart that she had to cancel her appointment yesterday because her father was admitted to the hospital. Waive no- show fee.

## 2016-11-09 ENCOUNTER — Telehealth: Payer: Self-pay | Admitting: Adult Health

## 2016-11-09 NOTE — Telephone Encounter (Signed)
Appointment Request From: [name] [name]    With Provider: Ward Givens, NP [[facility]]    Preferred Date Range: From 11/10/2016 To 11/27/2016    Preferred Times: Monday Morning, Tuesday Morning, Wednesday Morning, Thursday Morning, Friday Morning    Reason for visit: Office Visit    Comments:  I need an appointment to replace the one I missed.

## 2016-11-09 NOTE — Telephone Encounter (Signed)
Spoke to pt and made appt for 01-20-17 at 1430 MM/NP next available.

## 2016-11-13 ENCOUNTER — Telehealth: Payer: Self-pay | Admitting: *Deleted

## 2016-11-13 ENCOUNTER — Other Ambulatory Visit: Payer: Self-pay | Admitting: *Deleted

## 2016-11-13 MED ORDER — OXCARBAZEPINE 600 MG PO TABS: ORAL_TABLET | ORAL | 3 refills | Status: DC

## 2016-11-13 NOTE — Telephone Encounter (Signed)
Received fax from [facility] department. Verified pt should be taking Trileptal 600mg  1/2 tablet in the morning and 1 tablet qpm. Faxed in new rx w/ refills to [phone]. Received confirmation. Advised no recent labs done. Pt has appt scheduled 01/20/17 with MM,NP

## 2016-12-03 ENCOUNTER — Ambulatory Visit (INDEPENDENT_AMBULATORY_CARE_PROVIDER_SITE_OTHER): Payer: Self-pay | Admitting: Internal Medicine

## 2016-12-03 ENCOUNTER — Encounter: Payer: Self-pay | Admitting: Internal Medicine

## 2016-12-03 DIAGNOSIS — Z8719 Personal history of other diseases of the digestive system: Secondary | ICD-10-CM

## 2016-12-03 DIAGNOSIS — Z8042 Family history of malignant neoplasm of prostate: Secondary | ICD-10-CM

## 2016-12-03 DIAGNOSIS — Z8249 Family history of ischemic heart disease and other diseases of the circulatory system: Secondary | ICD-10-CM

## 2016-12-03 DIAGNOSIS — Z8049 Family history of malignant neoplasm of other genital organs: Secondary | ICD-10-CM

## 2016-12-03 DIAGNOSIS — Z720 Tobacco use: Secondary | ICD-10-CM

## 2016-12-03 DIAGNOSIS — Z833 Family history of diabetes mellitus: Secondary | ICD-10-CM

## 2016-12-03 DIAGNOSIS — Z86010 Personal history of colonic polyps: Secondary | ICD-10-CM

## 2016-12-03 DIAGNOSIS — F17210 Nicotine dependence, cigarettes, uncomplicated: Secondary | ICD-10-CM

## 2016-12-03 DIAGNOSIS — Z0000 Encounter for general adult medical examination without abnormal findings: Secondary | ICD-10-CM

## 2016-12-03 DIAGNOSIS — Z0189 Encounter for other specified special examinations: Secondary | ICD-10-CM

## 2016-12-03 DIAGNOSIS — K219 Gastro-esophageal reflux disease without esophagitis: Secondary | ICD-10-CM

## 2016-12-03 DIAGNOSIS — Z950 Presence of cardiac pacemaker: Secondary | ICD-10-CM

## 2016-12-03 DIAGNOSIS — I442 Atrioventricular block, complete: Secondary | ICD-10-CM

## 2016-12-03 DIAGNOSIS — Z79899 Other long term (current) drug therapy: Secondary | ICD-10-CM

## 2016-12-03 DIAGNOSIS — R569 Unspecified convulsions: Secondary | ICD-10-CM

## 2016-12-03 DIAGNOSIS — Z806 Family history of leukemia: Secondary | ICD-10-CM

## 2016-12-03 DIAGNOSIS — G40909 Epilepsy, unspecified, not intractable, without status epilepticus: Secondary | ICD-10-CM

## 2016-12-03 NOTE — Patient Instructions (Signed)
Thank you for visiting clinic today. Please follow-up with neurology and cardiology as scheduled. Please see Neoma Laming so she can help you with financial assistance you need. We will give you a referral for mammography and will essentially to a gynecologist once you have your assistance. Please make an appointment for Pap smear whenever you are ready. You can follow-up with your PCP in 3 months.

## 2016-12-03 NOTE — Progress Notes (Signed)
   CC: To establish care.  HPI:  [name] is a 56 y.o. with past medical history as listed below came to the clinic to establish care with [place].  She has an history of seizure disorder, for which she sees neurology, next follow-up is on 01/07/2017, she missed one appointment in my because of some family circumstances. She is compliant with her Lamictal and Trileptal, she did not had any seizure for the last 1-1/2 year.  She also has an history of complete heart block, she had a permanent pacemaker, she had no current symptoms or complaints, denies any chest pain or palpitations, no exertional dyspnea, she follow-up with cardiology, next follow-up appointment is in August 2018.  She had her colonoscopy in October 2017, found to have multiple polyps, which were removed and couple of them were hyperplastic, she needs to go back for her next colonoscopy in 3 years around October 2020.  She is due for mammogram, a referral was placed from her previous PCP, she never scheduled, lost her insurance, waiting for [name] [name] card now. We will give her a new referral once she will have her [name] [name] card.  She is also due for Pap smear-we offered today but she refused stating that she will make another appointment for that purpose. A referral was given for  GYN from her previous PCP because of her family history of uterine cancer, she never scheduled and now waiting for [name] [name] card.  Family history. Twin sister had uterine cancer at the age of 7. Father had an history of prostate cancer, skin cancer and leukemia along with hypertension and diabetes. Brother has hypertension and another sister with diabetes.  Social history. Lives with her father as his caretaker. Current smoker-half a pack per day Quit alcohol 7 years ago. Denies any illicit drug use.  Past Medical History:  Diagnosis Date  . Chronic insomnia 03/07/2015  . Diverticulitis   . GERD (gastroesophageal reflux disease)   . Hiatal  hernia   . LBBB (left bundle branch block)   . OSA on CPAP   . Panic disorder 09/07/2014  . Seizures ([place])   . Wrist tendonitis    R    Review of Systems:  As per HPI.  Physical Exam:  Vitals:   12/03/16 1323  BP: 137/68  Pulse: 79  Temp: 98.2 F (36.8 C)  TempSrc: Oral  SpO2: 99%  Weight: 181 lb (82.1 kg)  Height: 5\' 2"  (1.575 m)    General: Vital signs reviewed.  Patient is well-developed and well-nourished, in no acute distress and cooperative with exam.  Head: Normocephalic and atraumatic. Eyes: EOMI, conjunctivae normal, no scleral icterus.  Neck: Supple, trachea midline, normal ROM, no JVD, masses, thyromegaly, or carotid bruit present.  Cardiovascular: RRR, S1 normal, S2 normal, no murmurs, gallops, or rubs. Pulmonary/Chest: Clear to auscultation bilaterally, no wheezes, rales, or rhonchi. Abdominal: Soft, non-tender, non-distended, BS +, no masses, organomegaly, or guarding present.  Extremities: No lower extremity edema bilaterally,  pulses symmetric and intact bilaterally. No cyanosis or clubbing. Neurological: A&O x3, Strength is normal and symmetric bilaterally, cranial nerve II-XII are grossly intact, no focal motor deficit, sensory intact to light touch bilaterally.  Skin: Warm, dry and intact. No rashes or erythema. Psychiatric: Normal mood and affect. speech and behavior is normal. Cognition and memory are normal.  Assessment & Plan:   See Encounters Tab for problem based charting.  Patient discussed with Dr. Evette Doffing.

## 2016-12-04 NOTE — Progress Notes (Signed)
Internal Medicine Clinic Attending  Case discussed with Dr. Amin at the time of the visit.  We reviewed the resident's history and exam and pertinent patient test results.  I agree with the assessment, diagnosis, and plan of care documented in the resident's note.    

## 2016-12-04 NOTE — Assessment & Plan Note (Addendum)
She had her colonoscopy in October 2017, found to have multiple polyps, which were removed and couple of them were hyperplastic, she needs to go back for her next colonoscopy in 3 years around October 2020.  She is due for mammogram, a referral was placed from her previous PCP, she never scheduled, lost her insurance, waiting for [facility] card now. We will give her a new referral once she will have her [place] card.  She is also due for Pap smear-we offered today but she refused stating that she will make another appointment for that purpose. A referral was given for  GYN from her previous PCP because of her family history of uterine cancer, she never scheduled and now waiting for [facility] card.  Her lipid profile was done in September 2017 by her previous PCP which shows dyslipidemia, her ASCVD risk score is 10.1 according to that lab work. Which makes her moderate to high intensity statin candidate, she is already on Lipitor 20 mg daily. Continue Lipitor 20 mg daily. -

## 2016-12-04 NOTE — Assessment & Plan Note (Signed)
According to patient she is trying to decrease her use. Currently smoking half a pack per day which has been decreased from more than one pack per day.  I offered her help if she needed to completely quit.  Patient is not ready at this time.

## 2016-12-04 NOTE — Assessment & Plan Note (Signed)
She denies any worsening of GERD symptoms currently.  She uses Prilosec as an when necessary basis.

## 2016-12-04 NOTE — Assessment & Plan Note (Signed)
She follow-up with neurology.  Lastly seizure was 1-1/2 year ago.  Her Trileptal dose was decreased during previous visit with neurology, currently taking 300 mg in the morning and 600 at night. She is on Lamictal 150 mg twice daily.  -Continue current management and follow up with neurology.

## 2016-12-04 NOTE — Assessment & Plan Note (Signed)
She has no current symptoms and complaint.  Her last device check was in May 2018 which was normal.  -Continue follow-up with cardiology as advised.

## 2017-01-05 ENCOUNTER — Ambulatory Visit (INDEPENDENT_AMBULATORY_CARE_PROVIDER_SITE_OTHER): Payer: Self-pay | Admitting: *Deleted

## 2017-01-05 DIAGNOSIS — I442 Atrioventricular block, complete: Secondary | ICD-10-CM

## 2017-01-05 NOTE — Progress Notes (Signed)
Remote pacemaker transmission.   

## 2017-01-07 ENCOUNTER — Encounter: Payer: Self-pay | Admitting: Internal Medicine

## 2017-01-07 LAB — CUP PACEART REMOTE DEVICE CHECK
Brady Statistic AP VS Percent: 0.02 %
Brady Statistic AS VP Percent: 43.52 %
Brady Statistic AS VS Percent: 55.69 %
Implantable Lead Implant Date: 20160707
Implantable Lead Location: 753860
Implantable Lead Model: 5076
Lead Channel Impedance Value: 399 Ohm
Lead Channel Impedance Value: 855 Ohm
Lead Channel Pacing Threshold Pulse Width: 0.4 ms
Lead Channel Pacing Threshold Pulse Width: 0.4 ms
Lead Channel Sensing Intrinsic Amplitude: 23.875 mV
Lead Channel Sensing Intrinsic Amplitude: 23.875 mV
Lead Channel Sensing Intrinsic Amplitude: 3.875 mV
Lead Channel Sensing Intrinsic Amplitude: 3.875 mV
Lead Channel Setting Pacing Amplitude: 2.5 V
Lead Channel Setting Pacing Pulse Width: 0.4 ms
MDC IDC LEAD IMPLANT DT: 20160707
MDC IDC LEAD LOCATION: 753859
MDC IDC MSMT BATTERY REMAINING LONGEVITY: 105 mo
MDC IDC MSMT BATTERY VOLTAGE: 3.02 V
MDC IDC MSMT LEADCHNL RA IMPEDANCE VALUE: 399 Ohm
MDC IDC MSMT LEADCHNL RA PACING THRESHOLD AMPLITUDE: 0.5 V
MDC IDC MSMT LEADCHNL RV IMPEDANCE VALUE: 836 Ohm
MDC IDC MSMT LEADCHNL RV PACING THRESHOLD AMPLITUDE: 0.5 V
MDC IDC PG IMPLANT DT: 20160707
MDC IDC SESS DTM: 20180731154537
MDC IDC SET LEADCHNL RA PACING AMPLITUDE: 1.5 V
MDC IDC SET LEADCHNL RV SENSING SENSITIVITY: 4 mV
MDC IDC STAT BRADY AP VP PERCENT: 0.76 %
MDC IDC STAT BRADY RA PERCENT PACED: 0.79 %
MDC IDC STAT BRADY RV PERCENT PACED: 44.3 %

## 2017-01-08 ENCOUNTER — Encounter: Payer: Self-pay | Admitting: Cardiology

## 2017-01-20 ENCOUNTER — Ambulatory Visit: Payer: Self-pay | Admitting: Adult Health

## 2017-02-10 ENCOUNTER — Other Ambulatory Visit: Payer: Self-pay | Admitting: *Deleted

## 2017-02-10 NOTE — Telephone Encounter (Signed)
Lamictal refill successfully faxed to [facility] Dept.

## 2017-02-16 ENCOUNTER — Telehealth: Payer: Self-pay | Admitting: *Deleted

## 2017-02-16 NOTE — Telephone Encounter (Signed)
Rx Lamictal printed on 05/28/16 destroyed since patient never picked up rx from the office.

## 2017-02-24 ENCOUNTER — Telehealth: Payer: Self-pay | Admitting: Adult Health

## 2017-02-24 NOTE — Telephone Encounter (Signed)
Called patient and informed her this office does not administer flu vaccines. Advised she can go to a pharmacy clinic, urgent care. She verbalized understanding, appreciation.

## 2017-02-24 NOTE — Telephone Encounter (Signed)
Appointment Request From: [name] [name]. [name] [name]    With Provider: Ward Givens, NP [[facility]]    Preferred Date Range: From 04/08/2017 To 04/08/2017    Preferred Times: Thursday Morning    Reason: To address the following health maintenance concerns.  Influenza Vaccine    Comments:  I have an appointment at [facility] on Nov. 1.2018 if I can get the flu shot then that would be great.

## 2017-03-31 DIAGNOSIS — Z23 Encounter for immunization: Secondary | ICD-10-CM | POA: Diagnosis not present

## 2017-04-06 ENCOUNTER — Ambulatory Visit (INDEPENDENT_AMBULATORY_CARE_PROVIDER_SITE_OTHER): Payer: Self-pay | Admitting: *Deleted

## 2017-04-06 DIAGNOSIS — I442 Atrioventricular block, complete: Secondary | ICD-10-CM

## 2017-04-06 NOTE — Progress Notes (Signed)
Remote pacemaker transmission.   

## 2017-04-08 ENCOUNTER — Encounter: Payer: Self-pay | Admitting: Adult Health

## 2017-04-08 ENCOUNTER — Ambulatory Visit (INDEPENDENT_AMBULATORY_CARE_PROVIDER_SITE_OTHER): Payer: Medicare Other | Admitting: Adult Health

## 2017-04-08 VITALS — BP 128/77 | HR 78 | Wt 175.6 lb

## 2017-04-08 DIAGNOSIS — R569 Unspecified convulsions: Secondary | ICD-10-CM

## 2017-04-08 DIAGNOSIS — Z5181 Encounter for therapeutic drug level monitoring: Secondary | ICD-10-CM

## 2017-04-08 LAB — CUP PACEART REMOTE DEVICE CHECK
Battery Voltage: 3.02 V
Brady Statistic AP VP Percent: 1.4 %
Brady Statistic AS VS Percent: 56.68 %
Brady Statistic RV Percent Paced: 43.33 %
Implantable Lead Implant Date: 20160707
Implantable Lead Location: 753859
Implantable Pulse Generator Implant Date: 20160707
Lead Channel Impedance Value: 380 Ohm
Lead Channel Impedance Value: 760 Ohm
Lead Channel Impedance Value: 798 Ohm
Lead Channel Pacing Threshold Amplitude: 0.5 V
Lead Channel Pacing Threshold Amplitude: 0.5 V
Lead Channel Sensing Intrinsic Amplitude: 19.75 mV
Lead Channel Sensing Intrinsic Amplitude: 19.75 mV
Lead Channel Setting Pacing Amplitude: 1.5 V
Lead Channel Setting Pacing Amplitude: 2.5 V
Lead Channel Setting Pacing Pulse Width: 0.4 ms
Lead Channel Setting Sensing Sensitivity: 4 mV
MDC IDC LEAD IMPLANT DT: 20160707
MDC IDC LEAD LOCATION: 753860
MDC IDC MSMT BATTERY REMAINING LONGEVITY: 99 mo
MDC IDC MSMT LEADCHNL RA IMPEDANCE VALUE: 380 Ohm
MDC IDC MSMT LEADCHNL RA PACING THRESHOLD PULSEWIDTH: 0.4 ms
MDC IDC MSMT LEADCHNL RA SENSING INTR AMPL: 3.75 mV
MDC IDC MSMT LEADCHNL RA SENSING INTR AMPL: 3.75 mV
MDC IDC MSMT LEADCHNL RV PACING THRESHOLD PULSEWIDTH: 0.4 ms
MDC IDC SESS DTM: 20181030171615
MDC IDC STAT BRADY AP VS PERCENT: 0.02 %
MDC IDC STAT BRADY AS VP PERCENT: 41.9 %
MDC IDC STAT BRADY RA PERCENT PACED: 1.42 %

## 2017-04-08 NOTE — Progress Notes (Signed)
I have read the note, and I agree with the clinical assessment and plan.  WILLIS,CHARLES KEITH   

## 2017-04-08 NOTE — Progress Notes (Signed)
PATIENT: [name] [name] DOB: 05/28/1961  REASON FOR VISIT: follow up-seizures HISTORY FROM: patient  HISTORY OF PRESENT ILLNESS: Today 04/08/17 [name] [name] is a 56 year old female with a history of seizure type events.  She returns today for follow-up.  She continues on Lamictal and Trileptal.  She reports that she has not had any additional seizure events.  She does not operate a motor vehicle.  She is able to complete all ADLs independently.  Today her main concern is her balance.  She states that she has not had any recent falls although she does tend to bump into walls frequently.  She reports that her balance significantly worsened when she was placed on seizure medication.  She does report that she has approximately 3 days a week that her balance is good.  She returns today for an evaluation.  HISTORY  [name] [name] is a 56 year old left-handed white female with a history of seizure-type events. The patient has had a pacemaker placed, she has not had any actual blackout events since that time. The patient has reported episodes of tremors or myoclonus involving arms, the last event occurred in August 2017. When the events will occur, patient does not lose consciousness even though both sides of the body are involved. The patient has been on a combination of Lamictal and Trileptal, she has had some problems with cognitive slowing, staggery gait, but no falls. The patient may have some word finding problems as well. The patient has been out of work, she is applying for disability. She is not operating a motor vehicle at this time. Blood work done in the summer 2017 showed a therapeutic level of Lamictal and Trileptal, but the Trileptal level was in the upper limits of therapeutic. The patient reports occasional episodes of sharp pain lasting one or 2 minutes that may migrate about the head, occurring about once a week on average.   REVIEW OF SYSTEMS: Out of a complete 14 system review of  symptoms, the patient complains only of the following symptoms, and all other reviewed systems are negative.  Headache, tremors, decreased concentration, daytime sleepiness, snoring  ALLERGIES: Allergies  Allergen Reactions  . Zonisamide Anxiety    Panic Attacks  . Aspirin     sensitivity  . Ciprofloxacin     Seizures & it keeps her awake  . Codeine Hives  . Hydrocodone Hives  . Oxycodone Hives  . Penicillins Hives    HOME MEDICATIONS: Outpatient Medications Prior to Visit  Medication Sig Dispense Refill  . atorvastatin (LIPITOR) 20 MG tablet Take 1 tablet (20 mg total) by mouth daily. 90 tablet 1  . Cholecalciferol (VITAMIN D3) 2000 units capsule Take 1 capsule (2,000 Units total) by mouth daily. 90 capsule 0  . ibuprofen (ADVIL,MOTRIN) 200 MG tablet Take 200 mg by mouth every 6 (six) hours as needed for mild pain or moderate pain. Liquid-Gels    . lamoTRIgine (LAMICTAL) 150 MG tablet Take 1 tablet (150 mg total) by mouth 2 (two) times daily. 180 tablet 3  . Nutritional Supplements (ESTROVEN PO) Take 1 tablet by mouth.    Marland Kitchen omeprazole (PRILOSEC) 20 MG capsule Take 20 mg by mouth daily.    Marland Kitchen oxcarbazepine (TRILEPTAL) 600 MG tablet 1/2 tablet in the morning and 1 tablet in the evening 135 tablet 3  . vitamin B-12 (CYANOCOBALAMIN) 1000 MCG tablet Take 1 tablet (1,000 mcg total) by mouth daily. 30 tablet 5   Facility-Administered Medications Prior to Visit  Medication Dose  Route Frequency Provider Last Rate Last Dose  . 0.9 %  sodium chloride infusion  500 mL Intravenous Continuous Nandigam, Venia Minks, MD        PAST MEDICAL HISTORY: Past Medical History:  Diagnosis Date  . Chronic insomnia 03/07/2015  . Diverticulitis   . GERD (gastroesophageal reflux disease)   . Hiatal hernia   . LBBB (left bundle branch block)   . OSA on CPAP   . Panic disorder 09/07/2014  . Seizures ([place])   . Wrist tendonitis    R     PAST SURGICAL HISTORY: Past Surgical History:  Procedure  Laterality Date  . COLONOSCOPY    . EP IMPLANTABLE DEVICE N/A 12/13/2014   Procedure: Pacemaker Implant;  Surgeon: Deboraha Sprang, MD;  Location: [place] [facility];  Service: Cardiovascular;  Laterality: N/A;  . HAND SURGERY    . TUBAL LIGATION      FAMILY HISTORY: Family History  Problem Relation Age of Onset  . Alzheimer's disease Mother   . Hypertension Mother   . Hypertension Father   . Stroke Father   . Stroke Brother   . Hypertension Brother   . Sleep apnea Brother   . Kidney failure Sister   . Diabetes Sister   . Coronary artery disease Maternal Grandfather   . Parkinson's disease Maternal Grandmother     SOCIAL HISTORY: Social History   Social History  . Marital status: Single    Spouse name: N/A  . Number of children: 0  . Years of education: 14   Occupational History  . Not on file.   Social History Main Topics  . Smoking status: Current Every Day Smoker    Packs/day: 0.25    Years: 35.00    Types: Cigarettes  . Smokeless tobacco: Never Used  . Alcohol use No     Comment: Quit 12/06/2009  . Drug use: No  . Sexual activity: Not on file   Other Topics Concern  . Not on file   Social History Narrative   Patient drinks 24 oz of soda daily.   Lives with sister   Some college   Left handed         PHYSICAL EXAM  Vitals:   04/08/17 1119  BP: 128/77  Pulse: 78  Weight: 175 lb 9.6 oz (79.7 kg)   Body mass index is 32.12 kg/m.  Generalized: Well developed, in no acute distress   Neurological examination  Mentation: Alert oriented to time, place, history taking. Follows all commands speech and language fluent Cranial nerve II-XII: Pupils were equal round reactive to light. Extraocular movements were full, visual field were full on confrontational test. Facial sensation and strength were normal. Uvula tongue midline. Head turning and shoulder shrug  were normal and symmetric. Motor: The motor testing reveals 5 over 5 strength of all 4  extremities. Good symmetric motor tone is noted throughout.  Sensory: Sensory testing is intact to soft touch on all 4 extremities. No evidence of extinction is noted.  Coordination: Cerebellar testing reveals good finger-nose-finger and heel-to-shin bilaterally.  Gait and station: Gait is normal. Tandem gait is normal. Romberg is negative. No drift is seen.  Reflexes: Deep tendon reflexes are symmetric and normal bilaterally.   DIAGNOSTIC DATA (LABS, IMAGING, TESTING) - I reviewed patient records, labs, notes, testing and imaging myself where available.  Lab Results  Component Value Date   WBC 8.2 01/07/2016   HGB 13.5 01/07/2016   HCT 40.1 01/07/2016   MCV 91  01/07/2016   PLT 253 01/07/2016      Component Value Date/Time   NA 137 01/07/2016 0924   K 4.2 01/07/2016 0924   CL 98 01/07/2016 0924   CO2 19 01/07/2016 0924   GLUCOSE 85 01/07/2016 0924   GLUCOSE 110 (H) 12/17/2014 0353   BUN 9 01/07/2016 0924   CREATININE 0.88 01/07/2016 0924   CALCIUM 9.0 01/07/2016 0924   PROT 6.8 01/07/2016 0924   ALBUMIN 4.2 01/07/2016 0924   AST 14 01/07/2016 0924   ALT 13 01/07/2016 0924   ALKPHOS 147 (H) 01/07/2016 0924   BILITOT 0.4 01/07/2016 0924   GFRNONAA 74 01/07/2016 0924   GFRAA 85 01/07/2016 0924   Lab Results  Component Value Date   CHOL 257 (H) 03/02/2016   HDL 41.90 03/02/2016   LDLDIRECT 199.0 03/02/2016   TRIG 205.0 (H) 03/02/2016   CHOLHDL 6 03/02/2016   No results found for: HGBA1C Lab Results  Component Value Date   VITAMINB12 177 (L) 04/11/2015   Lab Results  Component Value Date   TSH 1.399 12/12/2014      ASSESSMENT AND PLAN 56 y.o. year old female  has a past medical history of Chronic insomnia (03/07/2015); Diverticulitis; GERD (gastroesophageal reflux disease); Hiatal hernia; LBBB (left bundle branch block); OSA on CPAP; Panic disorder (09/07/2014); Seizures ([place]); and Wrist tendonitis. here with:  1.  Seizures  Overall the patient has remained  stable.  I will check blood work today.  She is advised that if her symptoms worsen or she develops new symptoms she should let us know.  I did advise that Lamictal and Trileptal can affect her balance.  We certainly can decrease her dose however thatmay put her at risk for seizure.  She has tried other medications in the past but those did not control her seizures.  Patient voiced understanding.  She will follow-up in 6 months or sooner if needed.  I spent 15 minutes with the patient. 50% of this time was spent discussing medication.   Ward Givens, MSN, NP-C 04/08/2017, 11:40 AM [facility] [address], [place], [place] [address] [phone]

## 2017-04-08 NOTE — Patient Instructions (Signed)
Your Plan:  Continue Lamictal and Trileptal  Blood work today If your symptoms worsen or you develop new symptoms please let us know.   Thank you for coming to see [place] at [facility]. I hope we have been able to provide you high quality care today.  You may receive a patient satisfaction survey over the next few weeks. We would appreciate your feedback and comments so that we may continue to improve ourselves and the health of our patients.

## 2017-04-09 ENCOUNTER — Other Ambulatory Visit (INDEPENDENT_AMBULATORY_CARE_PROVIDER_SITE_OTHER): Payer: Self-pay

## 2017-04-09 DIAGNOSIS — Z0289 Encounter for other administrative examinations: Secondary | ICD-10-CM

## 2017-04-09 DIAGNOSIS — Z5181 Encounter for therapeutic drug level monitoring: Secondary | ICD-10-CM | POA: Diagnosis not present

## 2017-04-14 ENCOUNTER — Encounter: Payer: Self-pay | Admitting: Cardiology

## 2017-04-15 ENCOUNTER — Telehealth: Payer: Self-pay

## 2017-04-15 LAB — COMPREHENSIVE METABOLIC PANEL
ALBUMIN: 4.6 g/dL (ref 3.5–5.5)
ALK PHOS: 202 IU/L — AB (ref 39–117)
ALT: 16 IU/L (ref 0–32)
AST: 16 IU/L (ref 0–40)
Albumin/Globulin Ratio: 1.8 (ref 1.2–2.2)
BUN/Creatinine Ratio: 9 (ref 9–23)
BUN: 10 mg/dL (ref 6–24)
Bilirubin Total: 0.4 mg/dL (ref 0.0–1.2)
CALCIUM: 9.6 mg/dL (ref 8.7–10.2)
CO2: 24 mmol/L (ref 20–29)
CREATININE: 1.1 mg/dL — AB (ref 0.57–1.00)
Chloride: 98 mmol/L (ref 96–106)
GFR calc Af Amer: 64 mL/min/{1.73_m2} (ref >59)
GFR, EST NON AFRICAN AMERICAN: 56 mL/min/{1.73_m2} — AB (ref >59)
GLOBULIN, TOTAL: 2.5 g/dL (ref 1.5–4.5)
GLUCOSE: 104 mg/dL — AB (ref 65–99)
Potassium: 4.2 mmol/L (ref 3.5–5.2)
SODIUM: 139 mmol/L (ref 134–144)
Total Protein: 7.1 g/dL (ref 6.0–8.5)

## 2017-04-15 LAB — CBC WITH DIFFERENTIAL/PLATELET
BASOS: 0 %
Basophils Absolute: 0 10*3/uL (ref 0.0–0.2)
EOS (ABSOLUTE): 0.1 10*3/uL (ref 0.0–0.4)
EOS: 1 %
HEMATOCRIT: 41.6 % (ref 34.0–46.6)
HEMOGLOBIN: 14.5 g/dL (ref 11.1–15.9)
Immature Grans (Abs): 0 10*3/uL (ref 0.0–0.1)
Immature Granulocytes: 0 %
LYMPHS ABS: 3.2 10*3/uL — AB (ref 0.7–3.1)
Lymphs: 37 %
MCH: 31 pg (ref 26.6–33.0)
MCHC: 34.9 g/dL (ref 31.5–35.7)
MCV: 89 fL (ref 79–97)
MONOCYTES: 6 %
Monocytes Absolute: 0.5 10*3/uL (ref 0.1–0.9)
Neutrophils Absolute: 4.8 10*3/uL (ref 1.4–7.0)
Neutrophils: 56 %
Platelets: 237 10*3/uL (ref 150–379)
RBC: 4.68 x10E6/uL (ref 3.77–5.28)
RDW: 14.7 % (ref 12.3–15.4)
WBC: 8.6 10*3/uL (ref 3.4–10.8)

## 2017-04-15 LAB — 10-HYDROXYCARBAZEPINE: OXCARBAZEPINE SERPL-MCNC: 33 ug/mL (ref 10–35)

## 2017-04-15 LAB — LAMOTRIGINE LEVEL: Lamotrigine Lvl: 8.3 ug/mL (ref 2.0–20.0)

## 2017-04-15 NOTE — Telephone Encounter (Signed)
-----   Message from Ward Givens, NP sent at 04/15/2017 10:03 AM EST ----- Lab work is okay.  Alkaline phosphatase is elevated.we will need to recheck in 1 month.  Please inquire about the patient's gait and balance.  If she is still having difficulty we can adjust her medication.

## 2017-04-15 NOTE — Telephone Encounter (Signed)
I spoke with patient and made her aware of the lab results. She says that her gait and balance is still the same as it was, no better or worse.

## 2017-04-19 NOTE — Telephone Encounter (Signed)
I called the patient.  She states that she continues to have significant trouble with her balance.  She would like to reduce some of her medication to see if it is beneficial.  I did explain that reducing her seizure medication could put her at risk for seizure.  She voiced understanding.  I will discuss this with [name] and call her back with further instructions.

## 2017-04-20 MED ORDER — LAMOTRIGINE 200 MG PO TABS: 200.0000 mg | ORAL_TABLET | Freq: Two times a day (BID) | ORAL | 5 refills | Status: DC

## 2017-04-20 MED ORDER — OXCARBAZEPINE 300 MG PO TABS: 300.0000 mg | ORAL_TABLET | Freq: Two times a day (BID) | ORAL | 5 refills | Status: DC

## 2017-04-20 NOTE — Addendum Note (Signed)
Addended by: Trudie Buckler on: 04/20/2017 02:29 PM   Modules accepted: Orders

## 2017-04-20 NOTE — Telephone Encounter (Signed)
I consulted with [name].  He recommended reducing her Trileptal to 300 mg twice a day and increasing her Lamictal to 200 mg twice a day.  I have advised the patient of these changes and she is amenable.  I did remind the patient that making adjustments to her medication may put her at risk for having an additional seizure.  She voiced understanding.  Also advised that if she does not notice any improvement in her gait or balance she should let us know.

## 2017-06-11 ENCOUNTER — Ambulatory Visit: Payer: Medicare HMO | Admitting: Internal Medicine

## 2017-06-11 ENCOUNTER — Encounter: Payer: Self-pay | Admitting: Internal Medicine

## 2017-06-11 VITALS — BP 134/74 | HR 88 | Ht 62.0 in | Wt 180.0 lb

## 2017-06-11 DIAGNOSIS — I442 Atrioventricular block, complete: Secondary | ICD-10-CM

## 2017-06-11 DIAGNOSIS — Z950 Presence of cardiac pacemaker: Secondary | ICD-10-CM | POA: Diagnosis not present

## 2017-06-11 NOTE — Progress Notes (Signed)
.      Patient Care Team: Rankins, Bill Salinas, MD as PCP - General (Family Medicine)   HPI  [name] [name] is a 57 y.o. female Seen in follow-up for pacemaker implanted for intermittent complete heart block, a history of "seizures" associated with documented bradycardia and complete heart block in  the setting of underlying left bundle branch block.  Discussions with neurology thereafter prompted the discontinuation of her antiepileptics; however, further discussions with her primary neurologist redirected that plan the thought that drugs would be weaned over 6 months. The decision has been made to continue the meds   The patient denies chest pain, shortness of breath, nocturnal dyspnea, orthopnea or peripheral edema.  There have been no palpitations, lightheadedness or syncope.         Records and Results Reviewed neurology NOTES  Past Medical History:  Diagnosis Date  . Chronic insomnia 03/07/2015  . Diverticulitis   . GERD (gastroesophageal reflux disease)   . Hiatal hernia   . LBBB (left bundle branch block)   . OSA on CPAP   . Panic disorder 09/07/2014  . Seizures ([place])   . Wrist tendonitis    R     Past Surgical History:  Procedure Laterality Date  . COLONOSCOPY    . EP IMPLANTABLE DEVICE N/A 12/13/2014   Procedure: Pacemaker Implant;  Surgeon: Deboraha Sprang, MD;  Location: [place] [facility];  Service: Cardiovascular;  Laterality: N/A;  . HAND SURGERY    . TUBAL LIGATION      Current Outpatient Medications  Medication Sig Dispense Refill  . atorvastatin (LIPITOR) 20 MG tablet Take 1 tablet (20 mg total) by mouth daily. 90 tablet 1  . Cholecalciferol (VITAMIN D3) 2000 units capsule Take 1 capsule (2,000 Units total) by mouth daily. 90 capsule 0  . ibuprofen (ADVIL,MOTRIN) 200 MG tablet Take 200 mg by mouth every 6 (six) hours as needed for mild pain or moderate pain. Liquid-Gels    . lamoTRIgine (LAMICTAL) 200 MG tablet Take 1 tablet (200 mg total) 2 (two)  times daily by mouth. 60 tablet 5  . Nutritional Supplements (ESTROVEN PO) Take 1 tablet by mouth.    Marland Kitchen omeprazole (PRILOSEC) 20 MG capsule Take 20 mg by mouth daily.    . Oxcarbazepine (TRILEPTAL) 300 MG tablet Take 1 tablet (300 mg total) 2 (two) times daily by mouth. 60 tablet 5  . vitamin B-12 (CYANOCOBALAMIN) 1000 MCG tablet Take 1 tablet (1,000 mcg total) by mouth daily. 30 tablet 5   Current Facility-Administered Medications  Medication Dose Route Frequency Provider Last Rate Last Dose  . 0.9 %  sodium chloride infusion  500 mL Intravenous Continuous Nandigam, Venia Minks, MD        Allergies  Allergen Reactions  . Zonisamide Anxiety    Panic Attacks  . Aspirin     sensitivity  . Ciprofloxacin     Seizures & it keeps her awake  . Codeine Hives  . Hydrocodone Hives  . Oxycodone Hives  . Penicillins Hives      Review of Systems negative except from HPI and PMH  Physical Exam BP 134/74   Pulse 88   Ht 5\' 2"  (1.575 m)   Wt 180 lb (81.6 kg)   SpO2 96%   BMI 32.92 kg/m  Well developed and nourished in no acute distress HENT normal Neck supple with JVP-flat Device pocket well healed; without hematoma or erythema.  There is no tethering  Clear Regular rate and rhythm,  no murmurs or gallops Abd-soft with active BS No Clubbing cyanosis edema Skin-warm and dry A & Oriented  Grossly normal sensory and motor function    ECG sinus at 83 Intervals 18/12/41 Left axis deviation Poor R wave progression unchanged from 11 March 2015  Assessment and  Plan  Intermittent complete heart block  Pacemaker. Medtronic     Seizures    She has been pacing 40% of the time up from 25% of the time 18 months ago.  Her device is programmed in the DDD mode.  Interestingly her PR interval is relatively short suggesting that the heart block may be real.  I am going to reprogram her MVP on to see if we can reduce her ventricular pacing percentage.

## 2017-06-11 NOTE — Patient Instructions (Signed)
Medication Instructions: Your physician recommends that you continue on your current medications as directed. Please refer to the Current Medication list given to you today.  Labwork: None Ordered  Procedures/Testing: None Ordered  Follow-Up: Your physician wants you to follow-up in: 1 YEAR with Chanetta Marshall, NP. You will receive a reminder letter in the mail two months in advance. If you don't receive a letter, please call our office to schedule the follow-up appointment.  Remote monitoring is used to monitor your Pacemaker from home. This monitoring reduces the number of office visits required to check your device to one time per year. It allows [place] to keep an eye on the functioning of your device to ensure it is working properly. You are scheduled for a device check from home on 07/06/17. You may send your transmission at any time that day. If you have a wireless device, the transmission will be sent automatically. After your physician reviews your transmission, you will receive a postcard with your next transmission date.   If you need a refill on your cardiac medications before your next appointment, please call your pharmacy.

## 2017-06-17 ENCOUNTER — Telehealth: Payer: Self-pay | Admitting: Adult Health

## 2017-06-17 DIAGNOSIS — R748 Abnormal levels of other serum enzymes: Secondary | ICD-10-CM

## 2017-06-17 NOTE — Telephone Encounter (Signed)
Please call patient to have alkaline phosphatase recheck.  Order has been placed

## 2017-06-17 NOTE — Telephone Encounter (Signed)
Spoke with patient and informed her that NP has put in order for a repeat lab. Advised she come during regular office hours, advised her of hours,  no appt needed. She verbalized understanding.

## 2017-06-24 ENCOUNTER — Other Ambulatory Visit (INDEPENDENT_AMBULATORY_CARE_PROVIDER_SITE_OTHER): Payer: Self-pay

## 2017-06-24 DIAGNOSIS — Z0289 Encounter for other administrative examinations: Secondary | ICD-10-CM

## 2017-06-24 DIAGNOSIS — R748 Abnormal levels of other serum enzymes: Secondary | ICD-10-CM

## 2017-06-25 LAB — COMPREHENSIVE METABOLIC PANEL
ALBUMIN: 4.4 g/dL (ref 3.5–5.5)
ALK PHOS: 168 IU/L — AB (ref 39–117)
ALT: 21 IU/L (ref 0–32)
AST: 18 IU/L (ref 0–40)
Albumin/Globulin Ratio: 1.6 (ref 1.2–2.2)
BILIRUBIN TOTAL: 0.2 mg/dL (ref 0.0–1.2)
BUN / CREAT RATIO: 5 — AB (ref 9–23)
BUN: 6 mg/dL (ref 6–24)
CHLORIDE: 96 mmol/L (ref 96–106)
CO2: 24 mmol/L (ref 20–29)
CREATININE: 1.27 mg/dL — AB (ref 0.57–1.00)
Calcium: 9.6 mg/dL (ref 8.7–10.2)
GFR calc Af Amer: 54 mL/min/{1.73_m2} — ABNORMAL LOW (ref >59)
GFR calc non Af Amer: 47 mL/min/{1.73_m2} — ABNORMAL LOW (ref >59)
GLUCOSE: 90 mg/dL (ref 65–99)
Globulin, Total: 2.7 g/dL (ref 1.5–4.5)
Potassium: 5 mmol/L (ref 3.5–5.2)
Sodium: 138 mmol/L (ref 134–144)
Total Protein: 7.1 g/dL (ref 6.0–8.5)

## 2017-06-28 ENCOUNTER — Telehealth: Payer: Self-pay | Admitting: *Deleted

## 2017-06-28 NOTE — Telephone Encounter (Signed)
Spoke with patient and informed her that her lab showed Alkaline phosphatase has decreased. Megan NP did note abnormal kidney function.  advised her a copy of her blood work will be faxed  to her primary care provider, Dr Jenel Lucks. Patient verbalized understanding, appreciation. Labs successfully faxed to PCP.

## 2017-07-06 ENCOUNTER — Telehealth: Payer: Self-pay | Admitting: Cardiology

## 2017-07-06 ENCOUNTER — Ambulatory Visit (INDEPENDENT_AMBULATORY_CARE_PROVIDER_SITE_OTHER): Payer: Medicare HMO | Admitting: *Deleted

## 2017-07-06 DIAGNOSIS — I442 Atrioventricular block, complete: Secondary | ICD-10-CM | POA: Diagnosis not present

## 2017-07-06 LAB — CUP PACEART INCLINIC DEVICE CHECK
Brady Statistic AP VP Percent: 0.87 %
Brady Statistic AP VS Percent: 0.04 %
Brady Statistic AS VP Percent: 39.69 %
Brady Statistic RA Percent Paced: 0.91 %
Brady Statistic RV Percent Paced: 40.58 %
Date Time Interrogation Session: 20190104212252
Implantable Lead Implant Date: 20160707
Implantable Lead Location: 753859
Implantable Lead Model: 5076
Lead Channel Impedance Value: 817 Ohm
Lead Channel Impedance Value: 874 Ohm
Lead Channel Pacing Threshold Amplitude: 0.5 V
Lead Channel Pacing Threshold Amplitude: 0.5 V
Lead Channel Pacing Threshold Pulse Width: 0.4 ms
Lead Channel Setting Pacing Amplitude: 2.5 V
Lead Channel Setting Pacing Pulse Width: 0.4 ms
Lead Channel Setting Sensing Sensitivity: 1.2 mV
MDC IDC LEAD IMPLANT DT: 20160707
MDC IDC LEAD LOCATION: 753860
MDC IDC MSMT BATTERY REMAINING LONGEVITY: 105 mo
MDC IDC MSMT BATTERY VOLTAGE: 3.02 V
MDC IDC MSMT LEADCHNL RA IMPEDANCE VALUE: 380 Ohm
MDC IDC MSMT LEADCHNL RA IMPEDANCE VALUE: 399 Ohm
MDC IDC MSMT LEADCHNL RA PACING THRESHOLD PULSEWIDTH: 0.4 ms
MDC IDC MSMT LEADCHNL RA SENSING INTR AMPL: 4 mV
MDC IDC MSMT LEADCHNL RV SENSING INTR AMPL: 22.375 mV
MDC IDC PG IMPLANT DT: 20160707
MDC IDC SET LEADCHNL RA PACING AMPLITUDE: 1.5 V
MDC IDC STAT BRADY AS VS PERCENT: 59.4 %

## 2017-07-06 NOTE — Progress Notes (Signed)
Remote pacemaker transmission.   

## 2017-07-06 NOTE — Telephone Encounter (Signed)
Spoke with pt and reminded pt of remote transmission that is due today. Pt verbalized understanding.   

## 2017-07-08 ENCOUNTER — Encounter: Payer: Self-pay | Admitting: Cardiology

## 2017-07-21 LAB — CUP PACEART REMOTE DEVICE CHECK
Battery Voltage: 3.02 V
Brady Statistic AP VP Percent: 0 %
Brady Statistic AS VS Percent: 99.71 %
Brady Statistic RV Percent Paced: 0.2 %
Implantable Lead Implant Date: 20160707
Implantable Lead Location: 753859
Implantable Pulse Generator Implant Date: 20160707
Lead Channel Impedance Value: 399 Ohm
Lead Channel Impedance Value: 779 Ohm
Lead Channel Impedance Value: 817 Ohm
Lead Channel Pacing Threshold Amplitude: 0.5 V
Lead Channel Pacing Threshold Amplitude: 0.5 V
Lead Channel Setting Pacing Amplitude: 1.5 V
Lead Channel Setting Pacing Amplitude: 2.5 V
Lead Channel Setting Pacing Pulse Width: 0.4 ms
Lead Channel Setting Sensing Sensitivity: 1.2 mV
MDC IDC LEAD IMPLANT DT: 20160707
MDC IDC LEAD LOCATION: 753860
MDC IDC MSMT BATTERY REMAINING LONGEVITY: 105 mo
MDC IDC MSMT LEADCHNL RA IMPEDANCE VALUE: 380 Ohm
MDC IDC MSMT LEADCHNL RA PACING THRESHOLD PULSEWIDTH: 0.4 ms
MDC IDC MSMT LEADCHNL RA SENSING INTR AMPL: 4.5 mV
MDC IDC MSMT LEADCHNL RA SENSING INTR AMPL: 4.5 mV
MDC IDC MSMT LEADCHNL RV PACING THRESHOLD PULSEWIDTH: 0.4 ms
MDC IDC MSMT LEADCHNL RV SENSING INTR AMPL: 20.5 mV
MDC IDC MSMT LEADCHNL RV SENSING INTR AMPL: 20.5 mV
MDC IDC SESS DTM: 20190129194847
MDC IDC STAT BRADY AP VS PERCENT: 0.09 %
MDC IDC STAT BRADY AS VP PERCENT: 0.2 %
MDC IDC STAT BRADY RA PERCENT PACED: 0.1 %

## 2017-10-05 ENCOUNTER — Ambulatory Visit (INDEPENDENT_AMBULATORY_CARE_PROVIDER_SITE_OTHER): Payer: Medicare HMO | Admitting: *Deleted

## 2017-10-05 DIAGNOSIS — I442 Atrioventricular block, complete: Secondary | ICD-10-CM

## 2017-10-05 NOTE — Progress Notes (Signed)
Remote pacemaker transmission.   

## 2017-10-06 ENCOUNTER — Encounter: Payer: Self-pay | Admitting: Neurology

## 2017-10-06 ENCOUNTER — Encounter: Payer: Self-pay | Admitting: Cardiology

## 2017-10-06 ENCOUNTER — Ambulatory Visit: Payer: Medicare Other | Admitting: Neurology

## 2017-10-06 VITALS — BP 135/67 | HR 87 | Ht 62.0 in | Wt 189.0 lb

## 2017-10-06 DIAGNOSIS — R569 Unspecified convulsions: Secondary | ICD-10-CM | POA: Diagnosis not present

## 2017-10-06 DIAGNOSIS — Z5181 Encounter for therapeutic drug level monitoring: Secondary | ICD-10-CM | POA: Diagnosis not present

## 2017-10-06 LAB — CUP PACEART REMOTE DEVICE CHECK
Battery Remaining Longevity: 105 mo
Battery Voltage: 3.02 V
Brady Statistic AP VS Percent: 0.09 %
Brady Statistic AS VS Percent: 99.83 %
Brady Statistic RV Percent Paced: 0.08 %
Date Time Interrogation Session: 20190430153346
Implantable Lead Implant Date: 20160707
Implantable Pulse Generator Implant Date: 20160707
Lead Channel Impedance Value: 836 Ohm
Lead Channel Pacing Threshold Amplitude: 0.5 V
Lead Channel Pacing Threshold Amplitude: 0.5 V
Lead Channel Sensing Intrinsic Amplitude: 19.125 mV
Lead Channel Sensing Intrinsic Amplitude: 3.875 mV
Lead Channel Sensing Intrinsic Amplitude: 3.875 mV
Lead Channel Setting Pacing Amplitude: 2.5 V
Lead Channel Setting Sensing Sensitivity: 1.2 mV
MDC IDC LEAD IMPLANT DT: 20160707
MDC IDC LEAD LOCATION: 753859
MDC IDC LEAD LOCATION: 753860
MDC IDC MSMT LEADCHNL RA IMPEDANCE VALUE: 380 Ohm
MDC IDC MSMT LEADCHNL RA IMPEDANCE VALUE: 399 Ohm
MDC IDC MSMT LEADCHNL RA PACING THRESHOLD PULSEWIDTH: 0.4 ms
MDC IDC MSMT LEADCHNL RV IMPEDANCE VALUE: 798 Ohm
MDC IDC MSMT LEADCHNL RV PACING THRESHOLD PULSEWIDTH: 0.4 ms
MDC IDC MSMT LEADCHNL RV SENSING INTR AMPL: 19.125 mV
MDC IDC SET LEADCHNL RA PACING AMPLITUDE: 1.5 V
MDC IDC SET LEADCHNL RV PACING PULSEWIDTH: 0.4 ms
MDC IDC STAT BRADY AP VP PERCENT: 0 %
MDC IDC STAT BRADY AS VP PERCENT: 0.08 %
MDC IDC STAT BRADY RA PERCENT PACED: 0.09 %

## 2017-10-06 MED ORDER — OXCARBAZEPINE 300 MG PO TABS: 300.0000 mg | ORAL_TABLET | Freq: Two times a day (BID) | ORAL | 3 refills | Status: DC

## 2017-10-06 MED ORDER — LAMOTRIGINE 200 MG PO TABS: 200.0000 mg | ORAL_TABLET | Freq: Two times a day (BID) | ORAL | 3 refills | Status: DC

## 2017-10-06 MED ORDER — NORTRIPTYLINE HCL 10 MG PO CAPS: ORAL_CAPSULE | ORAL | 3 refills | Status: DC

## 2017-10-06 NOTE — Patient Instructions (Signed)
   We will start nortriptyline for the headache.  Pamelor (nortriptyline) is an antidepressant medication that has many uses that may include headache, whiplash injuries, or for peripheral neuropathy pain. Side effects may include drowsiness, dry mouth, blurred vision, or constipation. As with any antidepressant medication, worsening depression may occur. If you had any significant side effects, please call our office. The full effects of this medication may take 7-10 days after starting the drug, or going up on the dose.

## 2017-10-06 NOTE — Progress Notes (Signed)
Reason for visit: Seizures  [name] [name] is an 57 y.o. female  History of present illness:  [name] [name] is a 57 year old left-handed white female with a history of seizure type events.  The patient is on Lamictal and Trileptal, the Trileptal dose was reduced when last seen secondary to reports of some gait instability, this dose reduction did not improve the walking problem.  The Lamictal dose was increased, the patient has begun having headaches, usually having about 10 headache days a month.  The patient takes ibuprofen for the headache.  The patient has not had any further seizure episodes, she does not drive a car but this is secondary to the fact that her car is not working.  The patient continues to have chronic insomnia.  She has not had any overt falls since last seen.  She returns for an evaluation.  Past Medical History:  Diagnosis Date  . Chronic insomnia 03/07/2015  . Diverticulitis   . GERD (gastroesophageal reflux disease)   . Hiatal hernia   . LBBB (left bundle branch block)   . OSA on CPAP   . Panic disorder 09/07/2014  . Seizures ([place])   . Wrist tendonitis    R     Past Surgical History:  Procedure Laterality Date  . COLONOSCOPY    . EP IMPLANTABLE DEVICE N/A 12/13/2014   Procedure: Pacemaker Implant;  Surgeon: Deboraha Sprang, MD;  Location: [facility];  Service: Cardiovascular;  Laterality: N/A;  . HAND SURGERY    . TUBAL LIGATION      Family History  Problem Relation Age of Onset  . Alzheimer's disease Mother   . Hypertension Mother   . Hypertension Father   . Stroke Father   . Stroke Brother   . Hypertension Brother   . Sleep apnea Brother   . Kidney failure Sister   . Diabetes Sister   . Coronary artery disease Maternal Grandfather   . Parkinson's disease Maternal Grandmother     Social history:  reports that she has been smoking cigarettes.  She has a 8.75 pack-year smoking history. She has never used smokeless tobacco. She reports  that she does not drink alcohol or use drugs.    Allergies  Allergen Reactions  . Zonisamide Anxiety    Panic Attacks  . Aspirin     sensitivity  . Ciprofloxacin     Seizures & it keeps her awake  . Codeine Hives  . Hydrocodone Hives  . Oxycodone Hives  . Penicillins Hives    Medications:  Prior to Admission medications   Medication Sig Start Date End Date Taking? Authorizing Provider  atorvastatin (LIPITOR) 20 MG tablet Take 1 tablet (20 mg total) by mouth daily. 10/05/16  Yes Golden Circle, FNP  Cholecalciferol (VITAMIN D3) 2000 units capsule Take 1 capsule (2,000 Units total) by mouth daily. 03/17/16  Yes Golden Circle, FNP  ibuprofen (ADVIL,MOTRIN) 200 MG tablet Take 200 mg by mouth every 6 (six) hours as needed for mild pain or moderate pain. Liquid-Gels   Yes [provider]  lamoTRIgine (LAMICTAL) 200 MG tablet Take 1 tablet (200 mg total) 2 (two) times daily by mouth. 04/20/17  Yes Millikan, Megan, NP  Nutritional Supplements (ESTROVEN PO) Take 1 tablet by mouth.   Yes [provider]  omeprazole (PRILOSEC) 20 MG capsule Take 20 mg by mouth daily.   Yes [provider]  Oxcarbazepine (TRILEPTAL) 300 MG tablet Take 1 tablet (300 mg total) 2 (  two) times daily by mouth. 04/20/17  Yes Millikan, Megan, NP  vitamin B-12 (CYANOCOBALAMIN) 1000 MCG tablet Take 1 tablet (1,000 mcg total) by mouth daily. 07/09/15  Yes Ward Givens, NP    ROS:  Out of a complete 14 system review of symptoms, the patient complains only of the following symptoms, and all other reviewed systems are negative.  Frequent waking, daytime sleepiness Joint pain, joint swelling Dizziness, headache, speech difficulty  Blood pressure 135/67, pulse 87, height 5\' 2"  (1.575 m), weight 189 lb (85.7 kg).  Physical Exam  General: The patient is alert and cooperative at the time of the examination.  The patient is moderately obese.  Skin: No significant peripheral edema is  noted.   Neurologic Exam  Mental status: The patient is alert and oriented x 3 at the time of the examination. The patient has apparent normal recent and remote memory, with an apparently normal attention span and concentration ability.   Cranial nerves: Facial symmetry is present. Speech is normal, no aphasia or dysarthria is noted. Extraocular movements are full. Visual fields are full.  Motor: The patient has good strength in all 4 extremities.  Sensory examination: Soft touch sensation is symmetric on the face, arms, and legs.  Coordination: The patient has good finger-nose-finger and heel-to-shin bilaterally.  Gait and station: The patient has a normal gait. Tandem gait is normal. Romberg is negative. No drift is seen.  Reflexes: Deep tendon reflexes are symmetric.   Assessment/Plan:  1.  History of seizures  2.  Frequent headache  3.  Chronic insomnia  The patient will be placed on low-dose nortriptyline for the headache and for the insomnia.  She will be worked up to a 20 mg at night dose.  She will call for any dose adjustments.  We will get blood work today, a prescription was sent in for the Trileptal and Lamictal.  She will follow-up in 6 months.  Jill Alexanders MD 10/06/2017 9:11 AM  [facility] [address] [place], [place] [address]  Phone [phone] Fax [phone]

## 2017-10-08 LAB — CBC WITH DIFFERENTIAL/PLATELET
BASOS ABS: 0 10*3/uL (ref 0.0–0.2)
BASOS: 0 %
EOS (ABSOLUTE): 0.2 10*3/uL (ref 0.0–0.4)
Eos: 2 %
HEMOGLOBIN: 14.8 g/dL (ref 11.1–15.9)
Hematocrit: 44.7 % (ref 34.0–46.6)
IMMATURE GRANS (ABS): 0 10*3/uL (ref 0.0–0.1)
Immature Granulocytes: 0 %
LYMPHS ABS: 3.4 10*3/uL — AB (ref 0.7–3.1)
LYMPHS: 39 %
MCH: 30.4 pg (ref 26.6–33.0)
MCHC: 33.1 g/dL (ref 31.5–35.7)
MCV: 92 fL (ref 79–97)
MONOCYTES: 5 %
Monocytes Absolute: 0.4 10*3/uL (ref 0.1–0.9)
NEUTROS ABS: 4.8 10*3/uL (ref 1.4–7.0)
Neutrophils: 54 %
Platelets: 248 10*3/uL (ref 150–379)
RBC: 4.87 x10E6/uL (ref 3.77–5.28)
RDW: 14.4 % (ref 12.3–15.4)
WBC: 8.8 10*3/uL (ref 3.4–10.8)

## 2017-10-08 LAB — COMPREHENSIVE METABOLIC PANEL
ALBUMIN: 4.3 g/dL (ref 3.5–5.5)
ALT: 24 IU/L (ref 0–32)
AST: 19 IU/L (ref 0–40)
Albumin/Globulin Ratio: 1.7 (ref 1.2–2.2)
Alkaline Phosphatase: 146 IU/L — ABNORMAL HIGH (ref 39–117)
BUN / CREAT RATIO: 6 — AB (ref 9–23)
BUN: 7 mg/dL (ref 6–24)
Bilirubin Total: 0.4 mg/dL (ref 0.0–1.2)
CO2: 21 mmol/L (ref 20–29)
CREATININE: 1.09 mg/dL — AB (ref 0.57–1.00)
Calcium: 9.2 mg/dL (ref 8.7–10.2)
Chloride: 102 mmol/L (ref 96–106)
GFR, EST AFRICAN AMERICAN: 65 mL/min/{1.73_m2} (ref >59)
GFR, EST NON AFRICAN AMERICAN: 56 mL/min/{1.73_m2} — AB (ref >59)
GLOBULIN, TOTAL: 2.5 g/dL (ref 1.5–4.5)
GLUCOSE: 104 mg/dL — AB (ref 65–99)
Potassium: 4.1 mmol/L (ref 3.5–5.2)
SODIUM: 138 mmol/L (ref 134–144)
TOTAL PROTEIN: 6.8 g/dL (ref 6.0–8.5)

## 2017-10-08 LAB — LAMOTRIGINE LEVEL: Lamotrigine Lvl: 4.8 ug/mL (ref 2.0–20.0)

## 2017-10-08 LAB — 10-HYDROXYCARBAZEPINE: Oxcarbazepine SerPl-Mcnc: 12 ug/mL (ref 10–35)

## 2017-10-13 DIAGNOSIS — J358 Other chronic diseases of tonsils and adenoids: Secondary | ICD-10-CM | POA: Diagnosis not present

## 2017-11-09 ENCOUNTER — Other Ambulatory Visit: Payer: Self-pay | Admitting: Adult Health

## 2018-01-04 ENCOUNTER — Ambulatory Visit (INDEPENDENT_AMBULATORY_CARE_PROVIDER_SITE_OTHER): Payer: Medicare HMO | Admitting: *Deleted

## 2018-01-04 ENCOUNTER — Telehealth: Payer: Self-pay

## 2018-01-04 DIAGNOSIS — I442 Atrioventricular block, complete: Secondary | ICD-10-CM

## 2018-01-04 NOTE — Telephone Encounter (Signed)
I spoke with the patient about this.

## 2018-01-05 ENCOUNTER — Encounter: Payer: Self-pay | Admitting: Cardiology

## 2018-01-06 ENCOUNTER — Encounter: Payer: Self-pay | Admitting: Cardiology

## 2018-01-06 NOTE — Progress Notes (Signed)
Remote pacemaker transmission.   

## 2018-01-08 ENCOUNTER — Encounter: Payer: Self-pay | Admitting: Internal Medicine

## 2018-01-08 ENCOUNTER — Encounter: Payer: Self-pay | Admitting: Adult Health

## 2018-01-16 LAB — CUP PACEART REMOTE DEVICE CHECK
Brady Statistic AP VP Percent: 0 %
Brady Statistic AS VP Percent: 0.05 %
Brady Statistic AS VS Percent: 99.85 %
Brady Statistic RA Percent Paced: 0.09 %
Brady Statistic RV Percent Paced: 0.06 %
Date Time Interrogation Session: 20190801104802
Implantable Lead Implant Date: 20160707
Implantable Lead Location: 753860
Implantable Lead Model: 5076
Lead Channel Impedance Value: 779 Ohm
Lead Channel Impedance Value: 798 Ohm
Lead Channel Pacing Threshold Pulse Width: 0.4 ms
Lead Channel Sensing Intrinsic Amplitude: 22.375 mV
Lead Channel Setting Pacing Amplitude: 1.5 V
Lead Channel Setting Pacing Pulse Width: 0.4 ms
Lead Channel Setting Sensing Sensitivity: 1.2 mV
MDC IDC LEAD IMPLANT DT: 20160707
MDC IDC LEAD LOCATION: 753859
MDC IDC MSMT BATTERY REMAINING LONGEVITY: 98 mo
MDC IDC MSMT BATTERY VOLTAGE: 3.02 V
MDC IDC MSMT LEADCHNL RA IMPEDANCE VALUE: 399 Ohm
MDC IDC MSMT LEADCHNL RA IMPEDANCE VALUE: 399 Ohm
MDC IDC MSMT LEADCHNL RA PACING THRESHOLD AMPLITUDE: 0.5 V
MDC IDC MSMT LEADCHNL RA SENSING INTR AMPL: 2.875 mV
MDC IDC MSMT LEADCHNL RA SENSING INTR AMPL: 2.875 mV
MDC IDC MSMT LEADCHNL RV PACING THRESHOLD AMPLITUDE: 0.5 V
MDC IDC MSMT LEADCHNL RV PACING THRESHOLD PULSEWIDTH: 0.4 ms
MDC IDC MSMT LEADCHNL RV SENSING INTR AMPL: 22.375 mV
MDC IDC PG IMPLANT DT: 20160707
MDC IDC SET LEADCHNL RV PACING AMPLITUDE: 2.5 V
MDC IDC STAT BRADY AP VS PERCENT: 0.09 %

## 2018-04-07 ENCOUNTER — Ambulatory Visit (INDEPENDENT_AMBULATORY_CARE_PROVIDER_SITE_OTHER): Payer: Medicare HMO | Admitting: *Deleted

## 2018-04-07 DIAGNOSIS — I442 Atrioventricular block, complete: Secondary | ICD-10-CM

## 2018-04-08 NOTE — Progress Notes (Signed)
Remote pacemaker transmission.   

## 2018-04-11 ENCOUNTER — Encounter: Payer: Self-pay | Admitting: Adult Health

## 2018-04-11 ENCOUNTER — Telehealth: Payer: Self-pay | Admitting: *Deleted

## 2018-04-11 ENCOUNTER — Ambulatory Visit: Payer: Medicare HMO | Admitting: Adult Health

## 2018-04-11 NOTE — Telephone Encounter (Signed)
Patient called this morning and cancelled her follow up this morning re: "stomach bug".

## 2018-04-17 ENCOUNTER — Encounter: Payer: Self-pay | Admitting: Cardiology

## 2018-05-10 ENCOUNTER — Telehealth: Payer: Self-pay | Admitting: Adult Health

## 2018-05-10 NOTE — Telephone Encounter (Signed)
appt made 05-12-18 at 1000 with mm/NP

## 2018-05-10 NOTE — Telephone Encounter (Signed)
Unable to get in contact with the patient. I left her a voicemail to return my call.

## 2018-05-10 NOTE — Telephone Encounter (Signed)
Appointment Request From: [name]    With Provider: Ward Givens, NP [[facility]]    Preferred Date Range: 05/10/2018 - 06/07/2018    Preferred Times: Monday Morning, Tuesday Morning, Wednesday Morning, Thursday Morning, Friday Morning    Reason for visit: Request an Appointment    Comments:  I had a seizure on Fri. 05/06/18. I need to request a appointment sometime this month.

## 2018-05-12 ENCOUNTER — Encounter: Payer: Self-pay | Admitting: Adult Health

## 2018-05-12 ENCOUNTER — Ambulatory Visit: Payer: Medicare HMO | Admitting: Adult Health

## 2018-05-12 VITALS — BP 147/81 | HR 81 | Ht 62.0 in | Wt 181.8 lb

## 2018-05-12 DIAGNOSIS — R569 Unspecified convulsions: Secondary | ICD-10-CM

## 2018-05-12 DIAGNOSIS — Z5181 Encounter for therapeutic drug level monitoring: Secondary | ICD-10-CM | POA: Diagnosis not present

## 2018-05-12 NOTE — Progress Notes (Signed)
PATIENT: [name] [name] DOB: [birth year]  REASON FOR VISIT: follow up HISTORY FROM: patient  HISTORY OF PRESENT ILLNESS: Today 05/12/18:  [name] [name] is a 57 year old female with a history of seizure type events.  She returns today for follow-up.  She states that this past Friday she was getting out of the car to go get her nails done.  She states that she "feels off."  She states that she sat down and for 30 seconds to 1 minute she felt like she was in a dream state.  She states that she could see people but could not speak.  Afterwards she had trouble walking a straight line.  Denies any loss of consciousness.  No convulsing in the extremities.  No loss of bowel or bladder.  She states that she has been consistent with her Lamictal and Trileptal.  She denies missing any doses.  She is not late taking any of her medications.  Denies being sick.  Denies sleep deprivation.  Denies any increase in her stress level.  She not had any additional events.  Patient is not operating a motor vehicle. she returns today for evaluation.  HISTORY [name] [name] is a 57 year old left-handed white female with a history of seizure type events.  The patient is on Lamictal and Trileptal, the Trileptal dose was reduced when last seen secondary to reports of some gait instability, this dose reduction did not improve the walking problem.  The Lamictal dose was increased, the patient has begun having headaches, usually having about 10 headache days a month.  The patient takes ibuprofen for the headache.  The patient has not had any further seizure episodes, she does not drive a car but this is secondary to the fact that her car is not working.  The patient continues to have chronic insomnia.  She has not had any overt falls since last seen.  She returns for an evaluation.  REVIEW OF SYSTEMS: Out of a complete 14 system review of symptoms, the patient complains only of the following symptoms, and all other reviewed  systems are negative.  Dizziness headaches, seizure  ALLERGIES: Allergies  Allergen Reactions  . Zonisamide Anxiety    Panic Attacks  . Aspirin     sensitivity  . Ciprofloxacin     Seizures & it keeps her awake  . Codeine Hives  . Hydrocodone Hives  . Oxycodone Hives  . Penicillins Hives    HOME MEDICATIONS: Outpatient Medications Prior to Visit  Medication Sig Dispense Refill  . atorvastatin (LIPITOR) 20 MG tablet Take 1 tablet (20 mg total) by mouth daily. 90 tablet 1  . Cholecalciferol (VITAMIN D3) 2000 units capsule Take 1 capsule (2,000 Units total) by mouth daily. 90 capsule 0  . ibuprofen (ADVIL,MOTRIN) 200 MG tablet Take 200 mg by mouth every 6 (six) hours as needed for mild pain or moderate pain. Liquid-Gels    . lamoTRIgine (LAMICTAL) 200 MG tablet Take 1 tablet (200 mg total) by mouth 2 (two) times daily. 180 tablet 3  . nortriptyline (PAMELOR) 10 MG capsule Take one capsule at night for one week, then take 2 capsules at night 60 capsule 3  . Nutritional Supplements (ESTROVEN PO) Take 1 tablet by mouth.    Marland Kitchen omeprazole (PRILOSEC) 20 MG capsule Take 20 mg by mouth daily.    . Oxcarbazepine (TRILEPTAL) 300 MG tablet Take 1 tablet (300 mg total) by mouth 2 (two) times daily. 180 tablet 3  . vitamin B-12 (CYANOCOBALAMIN) 1000 MCG tablet  Take 1 tablet (1,000 mcg total) by mouth daily. 30 tablet 5   Facility-Administered Medications Prior to Visit  Medication Dose Route Frequency Provider Last Rate Last Dose  . 0.9 %  sodium chloride infusion  500 mL Intravenous Continuous Nandigam, Venia Minks, MD        PAST MEDICAL HISTORY: Past Medical History:  Diagnosis Date  . Chronic insomnia 03/07/2015  . Diverticulitis   . GERD (gastroesophageal reflux disease)   . Hiatal hernia   . LBBB (left bundle branch block)   . OSA on CPAP   . Panic disorder 09/07/2014  . Seizures ([place])   . Wrist tendonitis    R     PAST SURGICAL HISTORY: Past Surgical History:  Procedure  Laterality Date  . COLONOSCOPY    . EP IMPLANTABLE DEVICE N/A 12/13/2014   Procedure: Pacemaker Implant;  Surgeon: Deboraha Sprang, MD;  Location: [facility];  Service: Cardiovascular;  Laterality: N/A;  . HAND SURGERY    . TUBAL LIGATION      FAMILY HISTORY: Family History  Problem Relation Age of Onset  . Alzheimer's disease Mother   . Hypertension Mother   . Hypertension Father   . Stroke Father   . Leukemia Father   . Stroke Brother   . Hypertension Brother   . Sleep apnea Brother   . Kidney failure Sister   . Diabetes Sister   . Coronary artery disease Maternal Grandfather   . Parkinson's disease Maternal Grandmother     SOCIAL HISTORY: Social History   Socioeconomic History  . Marital status: Single    Spouse name: Not on file  . Number of children: 0  . Years of education: 73  . Highest education level: Not on file  Occupational History  . Not on file  Social Needs  . Financial resource strain: Not on file  . Food insecurity:    Worry: Not on file    Inability: Not on file  . Transportation needs:    Medical: Not on file    Non-medical: Not on file  Tobacco Use  . Smoking status: Current Every Day Smoker    Packs/day: 0.25    Years: 35.00    Pack years: 8.75    Types: Cigarettes  . Smokeless tobacco: Never Used  Substance and Sexual Activity  . Alcohol use: No    Alcohol/week: 0.0 standard drinks    Comment: Quit 12/06/2009  . Drug use: No  . Sexual activity: Not on file  Lifestyle  . Physical activity:    Days per week: Not on file    Minutes per session: Not on file  . Stress: Not on file  Relationships  . Social connections:    Talks on phone: Not on file    Gets together: Not on file    Attends religious service: Not on file    Active member of club or organization: Not on file    Attends meetings of clubs or organizations: Not on file    Relationship status: Not on file  . Intimate partner violence:    Fear of current or ex  partner: Not on file    Emotionally abused: Not on file    Physically abused: Not on file    Forced sexual activity: Not on file  Other Topics Concern  . Not on file  Social History Narrative   Patient drinks 24 oz of soda daily.   Lives with sister   Some college  Left handed      PHYSICAL EXAM  Vitals:   05/12/18 0944  BP: (!) 147/81  Pulse: 81  Weight: 181 lb 12.8 oz (82.5 kg)  Height: 5\' 2"  (1.575 m)   Body mass index is 33.25 kg/m.  Generalized: Well developed, in no acute distress   Neurological examination  Mentation: Alert oriented to time, place, history taking. Follows all commands speech and language fluent Cranial nerve II-XII: Pupils were equal round reactive to light. Extraocular movements were full, visual field were full on confrontational test. Facial sensation and strength were normal. Uvula tongue midline. Head turning and shoulder shrug  were normal and symmetric. Motor: The motor testing reveals 5 over 5 strength of all 4 extremities. Good symmetric motor tone is noted throughout.  Sensory: Sensory testing is intact to soft touch on all 4 extremities. No evidence of extinction is noted.  Coordination: Cerebellar testing reveals good finger-nose-finger and heel-to-shin bilaterally.  Gait and station: Gait is normal.  Reflexes: Deep tendon reflexes are symmetric and normal bilaterally.   DIAGNOSTIC DATA (LABS, IMAGING, TESTING) - I reviewed patient records, labs, notes, testing and imaging myself where available.  Lab Results  Component Value Date   WBC 8.8 10/06/2017   HGB 14.8 10/06/2017   HCT 44.7 10/06/2017   MCV 92 10/06/2017   PLT 248 10/06/2017      Component Value Date/Time   NA 138 10/06/2017 0922   K 4.1 10/06/2017 0922   CL 102 10/06/2017 0922   CO2 21 10/06/2017 0922   GLUCOSE 104 (H) 10/06/2017 0922   GLUCOSE 110 (H) 12/17/2014 0353   BUN 7 10/06/2017 0922   CREATININE 1.09 (H) 10/06/2017 0922   CALCIUM 9.2 10/06/2017 0922    PROT 6.8 10/06/2017 0922   ALBUMIN 4.3 10/06/2017 0922   AST 19 10/06/2017 0922   ALT 24 10/06/2017 0922   ALKPHOS 146 (H) 10/06/2017 0922   BILITOT 0.4 10/06/2017 0922   GFRNONAA 56 (L) 10/06/2017 0922   GFRAA 65 10/06/2017 0922   Lab Results  Component Value Date   CHOL 257 (H) 03/02/2016   HDL 41.90 03/02/2016   LDLDIRECT 199.0 03/02/2016   TRIG 205.0 (H) 03/02/2016   CHOLHDL 6 03/02/2016   No results found for: HGBA1C Lab Results  Component Value Date   VITAMINB12 177 (L) 04/11/2015   Lab Results  Component Value Date   TSH 1.399 12/12/2014      ASSESSMENT AND PLAN 57 y.o. year old female  has a past medical history of Chronic insomnia (03/07/2015), Diverticulitis, GERD (gastroesophageal reflux disease), Hiatal hernia, LBBB (left bundle branch block), OSA on CPAP, Panic disorder (09/07/2014), Seizures ([place]), and Wrist tendonitis. here with:  1.  Seizures  It is unclear if this event represents a true seizure.  Nevertheless the patient has been encouraged to continue on Lamictal and Trileptal.  I will check drug levels today.  Pending blood work we may consider increasing her medication.  She voiced understanding.  She will follow-up in 6 months or sooner if needed.   Ward Givens, MSN, NP-C 05/12/2018, 3:37 PM [facility] [address], [place], [place] [address] [phone]

## 2018-05-13 ENCOUNTER — Other Ambulatory Visit (INDEPENDENT_AMBULATORY_CARE_PROVIDER_SITE_OTHER): Payer: Self-pay

## 2018-05-13 DIAGNOSIS — Z0289 Encounter for other administrative examinations: Secondary | ICD-10-CM

## 2018-05-13 DIAGNOSIS — Z5181 Encounter for therapeutic drug level monitoring: Secondary | ICD-10-CM

## 2018-05-18 ENCOUNTER — Encounter: Payer: Self-pay | Admitting: Nurse Practitioner

## 2018-05-18 LAB — COMPREHENSIVE METABOLIC PANEL
ALBUMIN: 4.5 g/dL (ref 3.5–5.5)
ALT: 16 IU/L (ref 0–32)
AST: 14 IU/L (ref 0–40)
Albumin/Globulin Ratio: 1.9 (ref 1.2–2.2)
Alkaline Phosphatase: 141 IU/L — ABNORMAL HIGH (ref 39–117)
BUN / CREAT RATIO: 7 — AB (ref 9–23)
BUN: 6 mg/dL (ref 6–24)
Bilirubin Total: 0.3 mg/dL (ref 0.0–1.2)
CO2: 20 mmol/L (ref 20–29)
Calcium: 9.1 mg/dL (ref 8.7–10.2)
Chloride: 104 mmol/L (ref 96–106)
Creatinine, Ser: 0.89 mg/dL (ref 0.57–1.00)
GFR calc Af Amer: 83 mL/min/{1.73_m2} (ref >59)
GFR calc non Af Amer: 72 mL/min/{1.73_m2} (ref >59)
Globulin, Total: 2.4 g/dL (ref 1.5–4.5)
Glucose: 101 mg/dL — ABNORMAL HIGH (ref 65–99)
Potassium: 3.8 mmol/L (ref 3.5–5.2)
SODIUM: 141 mmol/L (ref 134–144)
Total Protein: 6.9 g/dL (ref 6.0–8.5)

## 2018-05-18 LAB — LAMOTRIGINE LEVEL: Lamotrigine Lvl: 9.3 ug/mL (ref 2.0–20.0)

## 2018-05-18 LAB — 10-HYDROXYCARBAZEPINE: Oxcarbazepine SerPl-Mcnc: 13 ug/mL (ref 10–35)

## 2018-05-23 ENCOUNTER — Telehealth: Payer: Self-pay | Admitting: Adult Health

## 2018-05-23 DIAGNOSIS — R569 Unspecified convulsions: Secondary | ICD-10-CM

## 2018-05-23 NOTE — Telephone Encounter (Signed)
Patient is returning Megan's call.

## 2018-05-23 NOTE — Telephone Encounter (Signed)
I called the patient and LVM 

## 2018-05-23 NOTE — Telephone Encounter (Addendum)
error 

## 2018-05-24 MED ORDER — LAMOTRIGINE 25 MG PO TABS: ORAL_TABLET | ORAL | 3 refills | Status: DC

## 2018-05-24 NOTE — Telephone Encounter (Signed)
Patient's lab work is in normal range.  I will increase Lamictal to 200 mg in the morning and 250 mg in the evening.  I have ordered Lamictal 25 mg she will begin taking 1 tablet at bedtime for 1 week then increase to 50 mg at bedtime.  She will take this in addition to 200 mg tablet twice a day.  Patient voiced understanding.  She will come in for blood work in 1 month.

## 2018-05-24 NOTE — Telephone Encounter (Signed)
Patient is returning Megan's call from yesterday.

## 2018-05-24 NOTE — Addendum Note (Signed)
Addended by: Trudie Buckler on: 05/24/2018 01:38 PM   Modules accepted: Orders

## 2018-06-07 LAB — CUP PACEART REMOTE DEVICE CHECK
Battery Remaining Longevity: 93 mo
Battery Voltage: 3.02 V
Brady Statistic AP VP Percent: 0 %
Brady Statistic AP VS Percent: 1.31 %
Brady Statistic AS VP Percent: 0.27 %
Brady Statistic AS VS Percent: 98.42 %
Brady Statistic RA Percent Paced: 1.31 %
Brady Statistic RV Percent Paced: 0.27 %
Date Time Interrogation Session: 20191031154433
Implantable Lead Implant Date: 20160707
Implantable Lead Implant Date: 20160707
Implantable Lead Location: 753860
Implantable Lead Model: 5076
Implantable Lead Model: 5076
Implantable Pulse Generator Implant Date: 20160707
Lead Channel Impedance Value: 361 Ohm
Lead Channel Impedance Value: 380 Ohm
Lead Channel Impedance Value: 703 Ohm
Lead Channel Impedance Value: 741 Ohm
Lead Channel Pacing Threshold Amplitude: 0.5 V
Lead Channel Pacing Threshold Amplitude: 0.5 V
Lead Channel Pacing Threshold Pulse Width: 0.4 ms
Lead Channel Pacing Threshold Pulse Width: 0.4 ms
Lead Channel Sensing Intrinsic Amplitude: 23.125 mV
Lead Channel Sensing Intrinsic Amplitude: 23.125 mV
Lead Channel Sensing Intrinsic Amplitude: 3.375 mV
Lead Channel Setting Pacing Amplitude: 1.5 V
Lead Channel Setting Pacing Amplitude: 2.5 V
Lead Channel Setting Sensing Sensitivity: 1.2 mV
MDC IDC LEAD LOCATION: 753859
MDC IDC MSMT LEADCHNL RA SENSING INTR AMPL: 3.375 mV
MDC IDC SET LEADCHNL RV PACING PULSEWIDTH: 0.4 ms

## 2018-06-10 ENCOUNTER — Encounter: Payer: Medicare HMO | Admitting: Nurse Practitioner

## 2018-06-10 NOTE — Progress Notes (Deleted)
Electrophysiology Office Note Date: 06/10/2018  ID:  [name] [name], DOB [birth year], MRN [id number]  PCP: Aretta Nip, MD Electrophysiologist: Caryl Comes  CC: Pacemaker follow-up  [name] [name] is a 58 y.o. female seen today for Dr Caryl Comes.  She presents today for routine electrophysiology followup.  Since last being seen in our clinic, the patient reports doing very well.  She denies chest pain, palpitations, dyspnea, PND, orthopnea, nausea, vomiting, dizziness, syncope, edema, weight gain, or early satiety.  Device History: MDT dual chamber PPM implanted 2016 for complete heart block   Past Medical History:  Diagnosis Date  . Chronic insomnia 03/07/2015  . Diverticulitis   . GERD (gastroesophageal reflux disease)   . Hiatal hernia   . LBBB (left bundle branch block)   . OSA on CPAP   . Panic disorder 09/07/2014  . Seizures ([place])   . Wrist tendonitis    R    Past Surgical History:  Procedure Laterality Date  . COLONOSCOPY    . EP IMPLANTABLE DEVICE N/A 12/13/2014   Procedure: Pacemaker Implant;  Surgeon: Deboraha Sprang, MD;  Location: [facility];  Service: Cardiovascular;  Laterality: N/A;  . HAND SURGERY    . TUBAL LIGATION      Current Outpatient Medications  Medication Sig Dispense Refill  . atorvastatin (LIPITOR) 20 MG tablet Take 1 tablet (20 mg total) by mouth daily. 90 tablet 1  . Cholecalciferol (VITAMIN D3) 2000 units capsule Take 1 capsule (2,000 Units total) by mouth daily. 90 capsule 0  . ibuprofen (ADVIL,MOTRIN) 200 MG tablet Take 200 mg by mouth every 6 (six) hours as needed for mild pain or moderate pain. Liquid-Gels    . lamoTRIgine (LAMICTAL) 200 MG tablet Take 1 tablet (200 mg total) by mouth 2 (two) times daily. 180 tablet 3  . lamoTRIgine (LAMICTAL) 25 MG tablet Take 1 tablet at bedtime for 1 week, increase to 2 tablet at bedtime thereafter 60 tablet 3  . nortriptyline (PAMELOR) 10 MG capsule Take one capsule at night for one week, then  take 2 capsules at night 60 capsule 3  . Nutritional Supplements (ESTROVEN PO) Take 1 tablet by mouth.    Marland Kitchen omeprazole (PRILOSEC) 20 MG capsule Take 20 mg by mouth daily.    . Oxcarbazepine (TRILEPTAL) 300 MG tablet Take 1 tablet (300 mg total) by mouth 2 (two) times daily. 180 tablet 3  . vitamin B-12 (CYANOCOBALAMIN) 1000 MCG tablet Take 1 tablet (1,000 mcg total) by mouth daily. 30 tablet 5   Current Facility-Administered Medications  Medication Dose Route Frequency Provider Last Rate Last Dose  . 0.9 %  sodium chloride infusion  500 mL Intravenous Continuous Nandigam, Kavitha V, MD        Allergies:   Zonisamide; Aspirin; Ciprofloxacin; Codeine; Hydrocodone; Oxycodone; and Penicillins   Social History: Social History   Socioeconomic History  . Marital status: Single    Spouse name: Not on file  . Number of children: 0  . Years of education: 36  . Highest education level: Not on file  Occupational History  . Not on file  Social Needs  . Financial resource strain: Not on file  . Food insecurity:    Worry: Not on file    Inability: Not on file  . Transportation needs:    Medical: Not on file    Non-medical: Not on file  Tobacco Use  . Smoking status: Current Every Day Smoker    Packs/day: 0.25  Years: 35.00    Pack years: 8.75    Types: Cigarettes  . Smokeless tobacco: Never Used  Substance and Sexual Activity  . Alcohol use: No    Alcohol/week: 0.0 standard drinks    Comment: Quit 12/06/2009  . Drug use: No  . Sexual activity: Not on file  Lifestyle  . Physical activity:    Days per week: Not on file    Minutes per session: Not on file  . Stress: Not on file  Relationships  . Social connections:    Talks on phone: Not on file    Gets together: Not on file    Attends religious service: Not on file    Active member of club or organization: Not on file    Attends meetings of clubs or organizations: Not on file    Relationship status: Not on file  . Intimate  partner violence:    Fear of current or ex partner: Not on file    Emotionally abused: Not on file    Physically abused: Not on file    Forced sexual activity: Not on file  Other Topics Concern  . Not on file  Social History Narrative   Patient drinks 24 oz of soda daily.   Lives with sister   Some college   Left handed    Family History: Family History  Problem Relation Age of Onset  . Alzheimer's disease Mother   . Hypertension Mother   . Hypertension Father   . Stroke Father   . Leukemia Father   . Stroke Brother   . Hypertension Brother   . Sleep apnea Brother   . Kidney failure Sister   . Diabetes Sister   . Coronary artery disease Maternal Grandfather   . Parkinson's disease Maternal Grandmother      Review of Systems: All other systems reviewed and are otherwise negative except as noted above.   Physical Exam: VS:  There were no vitals taken for this visit. , BMI There is no height or weight on file to calculate BMI.  GEN- The patient is well appearing, alert and oriented x 3 today.   HEENT: normocephalic, atraumatic; sclera clear, conjunctiva pink; hearing intact; oropharynx clear; neck supple  Lungs- Clear to ausculation bilaterally, normal work of breathing.  No wheezes, rales, rhonchi Heart- Regular rate and rhythm, no murmurs, rubs or gallops  GI- soft, non-tender, non-distended, bowel sounds present  Extremities- no clubbing, cyanosis, or edema  MS- no significant deformity or atrophy Skin- warm and dry, no rash or lesion; PPM pocket well healed Psych- euthymic mood, full affect Neuro- strength and sensation are intact  PPM Interrogation- reviewed in detail today,  See PACEART report  EKG:  EKG is not ordered today.  Recent Labs: 10/06/2017: Hemoglobin 14.8; Platelets 248 05/13/2018: ALT 16; BUN 6; Creatinine, Ser 0.89; Potassium 3.8; Sodium 141   Wt Readings from Last 3 Encounters:  05/12/18 181 lb 12.8 oz (82.5 kg)  10/06/17 189 lb (85.7 kg)    06/11/17 180 lb (81.6 kg)     Other studies Reviewed: Additional studies/ records that were reviewed today include: Dr Olin Pia office notes  Assessment and Plan:  1.  Intermittent complete heart block Normal PPM function See Pace Art report No changes today   Current medicines are reviewed at length with the patient today.   The patient does not have concerns regarding her medicines.  The following changes were made today:  none  Labs/ tests ordered today include: none No  orders of the defined types were placed in this encounter.    Disposition:   Follow up with [facility], Dr Caryl Comes 1 year     Signed, Chanetta Marshall, NP 06/10/2018 6:56 AM  [place] [place] [address] [phone] (office) [phone] (fax)

## 2018-06-13 ENCOUNTER — Encounter: Payer: Self-pay | Admitting: Adult Health

## 2018-06-21 ENCOUNTER — Telehealth: Payer: Self-pay | Admitting: Adult Health

## 2018-06-23 NOTE — Telephone Encounter (Signed)
error 

## 2018-07-06 NOTE — Progress Notes (Signed)
Electrophysiology Office Note Date: 07/07/2018  ID:  [name] [name], DOB 02/03/1961, MRN [id number]  PCP: Aretta Nip, MD Electrophysiologist: Caryl Comes  CC: Pacemaker follow-up  [name] [name] is a 58 y.o. female seen today for Dr Caryl Comes.  She presents today for routine electrophysiology followup.  Since last being seen in our clinic, the patient reports doing very well.  She denies chest pain, palpitations, dyspnea, PND, orthopnea, nausea, vomiting, dizziness, syncope, edema, weight gain, or early satiety.  Device History: MDT dual chamber PPM implanted 2016 for complete heart block   Past Medical History:  Diagnosis Date  . Chronic insomnia 03/07/2015  . Diverticulitis   . GERD (gastroesophageal reflux disease)   . Hiatal hernia   . LBBB (left bundle branch block)   . OSA on CPAP   . Panic disorder 09/07/2014  . Seizures ([place])   . Wrist tendonitis    R    Past Surgical History:  Procedure Laterality Date  . COLONOSCOPY    . EP IMPLANTABLE DEVICE N/A 12/13/2014   Procedure: Pacemaker Implant;  Surgeon: Deboraha Sprang, MD;  Location: [facility];  Service: Cardiovascular;  Laterality: N/A;  . HAND SURGERY    . TUBAL LIGATION      Current Outpatient Medications  Medication Sig Dispense Refill  . Cholecalciferol (VITAMIN D3) 2000 units capsule Take 1 capsule (2,000 Units total) by mouth daily. 90 capsule 0  . ibuprofen (ADVIL,MOTRIN) 200 MG tablet Take 200 mg by mouth every 6 (six) hours as needed for mild pain or moderate pain. Liquid-Gels    . lamoTRIgine (LAMICTAL) 200 MG tablet Take 1 tablet (200 mg total) by mouth 2 (two) times daily. 180 tablet 3  . lamoTRIgine (LAMICTAL) 25 MG tablet Take 50 mg by mouth daily.    . Nutritional Supplements (ESTROVEN PO) Take 1 tablet by mouth.    Marland Kitchen omeprazole (PRILOSEC) 20 MG capsule Take 20 mg by mouth daily.    . Oxcarbazepine (TRILEPTAL) 300 MG tablet Take 1 tablet (300 mg total) by mouth 2 (two) times daily. 180  tablet 3  . vitamin B-12 (CYANOCOBALAMIN) 1000 MCG tablet Take 1 tablet (1,000 mcg total) by mouth daily. 30 tablet 5   Current Facility-Administered Medications  Medication Dose Route Frequency Provider Last Rate Last Dose  . 0.9 %  sodium chloride infusion  500 mL Intravenous Continuous Nandigam, Kavitha V, MD        Allergies:   Zonisamide; Aspirin; Ciprofloxacin; Codeine; Hydrocodone; Oxycodone; and Penicillins   Social History: Social History   Socioeconomic History  . Marital status: Single    Spouse name: Not on file  . Number of children: 0  . Years of education: 79  . Highest education level: Not on file  Occupational History  . Not on file  Social Needs  . Financial resource strain: Not on file  . Food insecurity:    Worry: Not on file    Inability: Not on file  . Transportation needs:    Medical: Not on file    Non-medical: Not on file  Tobacco Use  . Smoking status: Current Every Day Smoker    Packs/day: 0.25    Years: 35.00    Pack years: 8.75    Types: Cigarettes  . Smokeless tobacco: Never Used  Substance and Sexual Activity  . Alcohol use: No    Alcohol/week: 0.0 standard drinks    Comment: Quit 12/06/2009  . Drug use: No  . Sexual activity: Not  on file  Lifestyle  . Physical activity:    Days per week: Not on file    Minutes per session: Not on file  . Stress: Not on file  Relationships  . Social connections:    Talks on phone: Not on file    Gets together: Not on file    Attends religious service: Not on file    Active member of club or organization: Not on file    Attends meetings of clubs or organizations: Not on file    Relationship status: Not on file  . Intimate partner violence:    Fear of current or ex partner: Not on file    Emotionally abused: Not on file    Physically abused: Not on file    Forced sexual activity: Not on file  Other Topics Concern  . Not on file  Social History Narrative   Patient drinks 24 oz of soda daily.     Lives with sister   Some college   Left handed    Family History: Family History  Problem Relation Age of Onset  . Alzheimer's disease Mother   . Hypertension Mother   . Hypertension Father   . Stroke Father   . Leukemia Father   . Stroke Brother   . Hypertension Brother   . Sleep apnea Brother   . Kidney failure Sister   . Diabetes Sister   . Coronary artery disease Maternal Grandfather   . Parkinson's disease Maternal Grandmother      Review of Systems: All other systems reviewed and are otherwise negative except as noted above.   Physical Exam: VS:  BP 130/88   Pulse 86   Ht 5\' 2"  (1.575 m)   Wt 177 lb 3.2 oz (80.4 kg)   SpO2 97%   BMI 32.41 kg/m  , BMI Body mass index is 32.41 kg/m.  GEN- The patient is well appearing, alert and oriented x 3 today.   HEENT: normocephalic, atraumatic; sclera clear, conjunctiva pink; hearing intact; oropharynx clear; neck supple  Lungs- Clear to ausculation bilaterally, normal work of breathing.  No wheezes, rales, rhonchi Heart- Regular rate and rhythm  GI- soft, non-tender, non-distended, bowel sounds present  Extremities- no clubbing, cyanosis, or edema  MS- no significant deformity or atrophy Skin- warm and dry, no rash or lesion; PPM pocket well healed Psych- euthymic mood, full affect Neuro- strength and sensation are intact  PPM Interrogation- reviewed in detail today,  See PACEART report  EKG:  EKG is not ordered today.  Recent Labs: 10/06/2017: Hemoglobin 14.8; Platelets 248 05/13/2018: ALT 16; BUN 6; Creatinine, Ser 0.89; Potassium 3.8; Sodium 141   Wt Readings from Last 3 Encounters:  07/07/18 177 lb 3.2 oz (80.4 kg)  05/12/18 181 lb 12.8 oz (82.5 kg)  10/06/17 189 lb (85.7 kg)     Other studies Reviewed: Additional studies/ records that were reviewed today include: Dr Olin Pia office notes   Assessment and Plan:  1.  Complete heart block Normal PPM function See Pace Art report No changes  today    Current medicines are reviewed at length with the patient today.   The patient does not have concerns regarding her medicines.  The following changes were made today:  none  Labs/ tests ordered today include: none Orders Placed This Encounter  Procedures  . CUP PACEART INCLINIC DEVICE CHECK     Disposition:   Follow up with [facility], Dr Caryl Comes 1 year     Signed, Chanetta Marshall, NP  07/07/2018 10:30 AM  [facility] [address] [place] [address] [phone] (office) [phone] (fax)

## 2018-07-07 ENCOUNTER — Ambulatory Visit: Payer: Medicare HMO | Admitting: Nurse Practitioner

## 2018-07-07 ENCOUNTER — Other Ambulatory Visit (INDEPENDENT_AMBULATORY_CARE_PROVIDER_SITE_OTHER): Payer: Self-pay

## 2018-07-07 ENCOUNTER — Encounter: Payer: Self-pay | Admitting: Nurse Practitioner

## 2018-07-07 VITALS — BP 130/88 | HR 86 | Ht 62.0 in | Wt 177.2 lb

## 2018-07-07 DIAGNOSIS — Z0289 Encounter for other administrative examinations: Secondary | ICD-10-CM

## 2018-07-07 DIAGNOSIS — I442 Atrioventricular block, complete: Secondary | ICD-10-CM | POA: Diagnosis not present

## 2018-07-07 DIAGNOSIS — R569 Unspecified convulsions: Secondary | ICD-10-CM

## 2018-07-07 LAB — CUP PACEART INCLINIC DEVICE CHECK
Date Time Interrogation Session: 20200130103008
Implantable Lead Implant Date: 20160707
Implantable Lead Implant Date: 20160707
Implantable Lead Location: 753859
Implantable Lead Location: 753860
Implantable Lead Model: 5076
Implantable Lead Model: 5076
Implantable Pulse Generator Implant Date: 20160707

## 2018-07-07 NOTE — Patient Instructions (Signed)
Medication Instructions:  none If you need a refill on your cardiac medications before your next appointment, please call your pharmacy.   Lab work: none If you have labs (blood work) drawn today and your tests are completely normal, you will receive your results only by: Marland Kitchen MyChart Message (if you have MyChart) OR . A paper copy in the mail If you have any lab test that is abnormal or we need to change your treatment, we will call you to review the results.  Testing/Procedures: none  Follow-Up: At [facility], you and your health needs are our priority.  As part of our continuing mission to provide you with exceptional heart care, we have created designated Provider Care Teams.  These Care Teams include your primary Cardiologist (physician) and Advanced Practice Providers (APPs -  Physician Assistants and Nurse Practitioners) who all work together to provide you with the care you need, when you need it. You will need a follow up appointment in 1 years.  Please call our office 2 months in advance to schedule this appointment.  You may see Dr Caryl Comes or one of the following Advanced Practice Providers on your designated Care Team:   Chanetta Marshall, NP . Tommye Standard, PA-C  Any Other Special Instructions Will Be Listed Below (If Applicable). Remote monitoring is used to monitor your Pacemaker  from home. This monitoring reduces the number of office visits required to check your device to one time per year. It allows [place] to keep an eye on the functioning of your device to ensure it is working properly. You are scheduled for a device check from home on 10/06/2018. You may send your transmission at any time that day. If you have a wireless device, the transmission will be sent automatically. After your physician reviews your transmission, you will receive a postcard with your next transmission date.

## 2018-07-11 ENCOUNTER — Telehealth: Payer: Self-pay | Admitting: *Deleted

## 2018-07-11 LAB — LAMOTRIGINE LEVEL: Lamotrigine Lvl: 10.1 ug/mL (ref 2.0–20.0)

## 2018-07-11 NOTE — Telephone Encounter (Signed)
-----   Message from Ward Givens, NP sent at 07/11/2018  2:06 PM EST ----- Lab work is unremarkable. Please inquire if she is tolerating well and if she has had any seizure events.

## 2018-07-11 NOTE — Telephone Encounter (Signed)
Spoke to pt and relayed that her lab results were unremarkable.  She is wanting to get lamotrigin 50mg  tablets if possible.  I will go ahead and place order (I see 50mg  TBDP).  OK?

## 2018-07-12 MED ORDER — LAMOTRIGINE 50 MG PO TBDP: 50.0000 mg | ORAL_TABLET | Freq: Every day | ORAL | 3 refills | Status: DC

## 2018-07-12 NOTE — Telephone Encounter (Signed)
That's fine

## 2018-07-12 NOTE — Telephone Encounter (Signed)
Ordered to [employer]. Spoke to pt and let her know as well.

## 2018-07-18 ENCOUNTER — Encounter: Payer: Self-pay | Admitting: Cardiology

## 2018-09-09 ENCOUNTER — Encounter: Payer: Self-pay | Admitting: Adult Health

## 2018-09-12 MED ORDER — LAMOTRIGINE 25 MG PO TABS: 50.0000 mg | ORAL_TABLET | Freq: Every day | ORAL | 2 refills | Status: DC

## 2018-09-12 NOTE — Telephone Encounter (Signed)
Called pt.  She stated that [facility] asking about changing her medications.  She is not having any problems, no seizures.  She did state that the lamotrigine 50mg  tablet is expensive.  I relayed can change back to lamotrigine 25mg  tabs and she take 2 tablets at bedtime.  I spoke to [place], Transport planner tech with [employer].  She stated that  last fill for lamotrigine 50mg  tablet were $161.  I relayed can go back to prescribing 25mg  tablets.  Pt aware.

## 2018-10-06 ENCOUNTER — Other Ambulatory Visit: Payer: Self-pay

## 2018-10-06 ENCOUNTER — Ambulatory Visit (INDEPENDENT_AMBULATORY_CARE_PROVIDER_SITE_OTHER): Payer: Medicare HMO | Admitting: *Deleted

## 2018-10-06 ENCOUNTER — Other Ambulatory Visit: Payer: Self-pay | Admitting: Neurology

## 2018-10-06 DIAGNOSIS — I442 Atrioventricular block, complete: Secondary | ICD-10-CM

## 2018-10-06 LAB — CUP PACEART REMOTE DEVICE CHECK
Battery Remaining Longevity: 92 mo
Battery Voltage: 3.02 V
Brady Statistic AP VP Percent: 0 %
Brady Statistic AP VS Percent: 0.61 %
Brady Statistic AS VP Percent: 0.05 %
Brady Statistic AS VS Percent: 99.35 %
Brady Statistic RA Percent Paced: 0.61 %
Brady Statistic RV Percent Paced: 0.05 %
Date Time Interrogation Session: 20200430121156
Implantable Lead Implant Date: 20160707
Implantable Lead Implant Date: 20160707
Implantable Lead Location: 753859
Implantable Lead Location: 753860
Implantable Lead Model: 5076
Implantable Lead Model: 5076
Implantable Pulse Generator Implant Date: 20160707
Lead Channel Impedance Value: 361 Ohm
Lead Channel Impedance Value: 380 Ohm
Lead Channel Impedance Value: 722 Ohm
Lead Channel Impedance Value: 760 Ohm
Lead Channel Pacing Threshold Amplitude: 0.5 V
Lead Channel Pacing Threshold Amplitude: 0.5 V
Lead Channel Pacing Threshold Pulse Width: 0.4 ms
Lead Channel Pacing Threshold Pulse Width: 0.4 ms
Lead Channel Sensing Intrinsic Amplitude: 21.875 mV
Lead Channel Sensing Intrinsic Amplitude: 21.875 mV
Lead Channel Sensing Intrinsic Amplitude: 4 mV
Lead Channel Sensing Intrinsic Amplitude: 4 mV
Lead Channel Setting Pacing Amplitude: 1.5 V
Lead Channel Setting Pacing Amplitude: 2.5 V
Lead Channel Setting Pacing Pulse Width: 0.4 ms
Lead Channel Setting Sensing Sensitivity: 1.2 mV

## 2018-10-06 MED ORDER — LAMOTRIGINE 200 MG PO TABS: 200.0000 mg | ORAL_TABLET | Freq: Two times a day (BID) | ORAL | 3 refills | Status: DC

## 2018-10-13 ENCOUNTER — Encounter: Payer: Self-pay | Admitting: Cardiology

## 2018-10-13 NOTE — Progress Notes (Signed)
Remote pacemaker transmission.   

## 2018-11-14 ENCOUNTER — Telehealth: Payer: Self-pay | Admitting: *Deleted

## 2018-11-14 NOTE — Telephone Encounter (Signed)
Due to current COVID 19 pandemic, our office is severely reducing in office visits until further notice, in order to minimize the risk to our patients and healthcare providers.  Pt understands that although there may be some limitations with this type of visit, we will take all precautions to reduce any security or privacy concerns.  Pt understands that this will be treated like an in office visit and we will file with pt's insurance, and there may be a patient responsible charge related to this service.  Consented to [email or web address] visit.  Email sent to [email or web address]

## 2018-11-16 ENCOUNTER — Encounter: Payer: Self-pay | Admitting: Adult Health

## 2018-11-16 ENCOUNTER — Ambulatory Visit (INDEPENDENT_AMBULATORY_CARE_PROVIDER_SITE_OTHER): Payer: Medicare HMO | Admitting: Adult Health

## 2018-11-16 ENCOUNTER — Other Ambulatory Visit: Payer: Self-pay

## 2018-11-16 DIAGNOSIS — R569 Unspecified convulsions: Secondary | ICD-10-CM | POA: Diagnosis not present

## 2018-11-16 DIAGNOSIS — Z5181 Encounter for therapeutic drug level monitoring: Secondary | ICD-10-CM

## 2018-11-16 NOTE — Progress Notes (Signed)
PATIENT: [name] [name] DOB: [birth year]  REASON FOR VISIT: follow up HISTORY FROM: patient  Virtual Visit via Video Note  I connected with [name] on 11/16/18 at  9:00 AM EDT by a video enabled telemedicine application located remotely at [facility] and verified that I am speaking with the correct person using two identifiers who was located at their own home.   I discussed the limitations of evaluation and management by telemedicine and the availability of in person appointments. The patient expressed understanding and agreed to proceed.   PATIENT: [name] [name] DOB: Jul 28, 1960  REASON FOR VISIT: follow up HISTORY FROM: patient  HISTORY OF PRESENT ILLNESS: Today 11/16/18 [name] [name] is a 58 year old female with a history of seizures.  At the last visit Lamictal was increased.  She states that she is not had any additional seizure events.  She continues on Lamictal and Trileptal.  She is not operating a motor vehicle.  She is able to complete all ADLs independently.  Denies any significant changes with her gait or balance.  She joins me today for virtual visit.  HISTORY 05/12/18:  Ms. Deroy is a 58 year old female with a history of seizure type events.  She returns today for follow-up.  She states that this past Friday she was getting out of the car to go get her nails done.  She states that she "feels off."  She states that she sat down and for 30 seconds to 1 minute she felt like she was in a dream state.  She states that she could see people but could not speak.  Afterwards she had trouble walking a straight line.  Denies any loss of consciousness.  No convulsing in the extremities.  No loss of bowel or bladder.  She states that she has been consistent with her Lamictal and Trileptal.  She denies missing any doses.  She is not late taking any of her medications.  Denies being sick.  Denies sleep deprivation.  Denies any increase in her  stress level.  She not had any additional events.  Patient is not operating a motor vehicle. she returns today for evaluation.  REVIEW OF SYSTEMS: Out of a complete 14 system review of symptoms, the patient complains only of the following symptoms, and all other reviewed systems are negative.  See HPI  ALLERGIES: Allergies  Allergen Reactions   Zonisamide Anxiety    Panic Attacks   Aspirin     sensitivity   Ciprofloxacin     Seizures & it keeps her awake   Codeine Hives   Hydrocodone Hives   Oxycodone Hives   Penicillins Hives    HOME MEDICATIONS: Outpatient Medications Prior to Visit  Medication Sig Dispense Refill   Cholecalciferol (VITAMIN D3) 2000 units capsule Take 1 capsule (2,000 Units total) by mouth daily. 90 capsule 0   ibuprofen (ADVIL,MOTRIN) 200 MG tablet Take 200 mg by mouth every 6 (six) hours as needed for mild pain or moderate pain. Liquid-Gels     lamoTRIgine (LAMICTAL) 200 MG tablet Take 1 tablet (200 mg total) by mouth 2 (two) times daily. 180 tablet 3   lamoTRIgine (LAMICTAL) 25 MG tablet Take 2 tablets (50 mg total) by mouth daily. 180 tablet 2   Nutritional Supplements (ESTROVEN PO) Take 1 tablet by mouth.     omeprazole (PRILOSEC) 20 MG capsule Take 20 mg by mouth daily.     Oxcarbazepine (TRILEPTAL) 300 MG tablet TAKE 1 TABLET TWICE DAILY 180  tablet 3   vitamin B-12 (CYANOCOBALAMIN) 1000 MCG tablet Take 1 tablet (1,000 mcg total) by mouth daily. 30 tablet 5   Facility-Administered Medications Prior to Visit  Medication Dose Route Frequency Provider Last Rate Last Dose   0.9 %  sodium chloride infusion  500 mL Intravenous Continuous Mauri Pole, MD        PAST MEDICAL HISTORY: Past Medical History:  Diagnosis Date   Chronic insomnia 03/07/2015   Diverticulitis    GERD (gastroesophageal reflux disease)    Hiatal hernia    LBBB (left bundle branch block)    OSA on CPAP    Panic disorder 09/07/2014   Seizures ([place])     Wrist tendonitis    R     PAST SURGICAL HISTORY: Past Surgical History:  Procedure Laterality Date   COLONOSCOPY     EP IMPLANTABLE DEVICE N/A 12/13/2014   Procedure: Pacemaker Implant;  Surgeon: Deboraha Sprang, MD;  Location: [facility];  Service: Cardiovascular;  Laterality: N/A;   HAND SURGERY     TUBAL LIGATION      FAMILY HISTORY: Family History  Problem Relation Age of Onset   Alzheimer's disease Mother    Hypertension Mother    Hypertension Father    Stroke Father    Leukemia Father    Stroke Brother    Hypertension Brother    Sleep apnea Brother    Kidney failure Sister    Diabetes Sister    Coronary artery disease Maternal Grandfather    Parkinson's disease Maternal Grandmother     SOCIAL HISTORY: Social History   Socioeconomic History   Marital status: Single    Spouse name: Not on file   Number of children: 0   Years of education: 14   Highest education level: Not on file  Occupational History   Not on file  Social Needs   Financial resource strain: Not on file   Food insecurity:    Worry: Not on file    Inability: Not on file   Transportation needs:    Medical: Not on file    Non-medical: Not on file  Tobacco Use   Smoking status: Current Every Day Smoker    Packs/day: 0.25    Years: 35.00    Pack years: 8.75    Types: Cigarettes   Smokeless tobacco: Never Used  Substance and Sexual Activity   Alcohol use: No    Alcohol/week: 0.0 standard drinks    Comment: Quit 12/06/2009   Drug use: No   Sexual activity: Not on file  Lifestyle   Physical activity:    Days per week: Not on file    Minutes per session: Not on file   Stress: Not on file  Relationships   Social connections:    Talks on phone: Not on file    Gets together: Not on file    Attends religious service: Not on file    Active member of club or organization: Not on file    Attends meetings of clubs or organizations: Not on file     Relationship status: Not on file   Intimate partner violence:    Fear of current or ex partner: Not on file    Emotionally abused: Not on file    Physically abused: Not on file    Forced sexual activity: Not on file  Other Topics Concern   Not on file  Social History Narrative   Patient drinks 24 oz of soda daily.  Lives with sister   Some college   Left handed      PHYSICAL EXAM Generalized: Well developed, in no acute distress   Neurological examination  Mentation: Alert oriented to time, place, history taking. Follows all commands speech and language fluent Cranial nerve II-XII:Extraocular movements were full. Facial symmetry noted. uvula tongue midline. Head turning and shoulder shrug  were normal and symmetric. Motor: Good strength throughout subjectively per patient Sensory: Sensory testing is intact to soft touch on all 4 extremities subjectively per patient Coordination: Cerebellar testing reveals good finger-nose-finger  Reflexes: UTA  DIAGNOSTIC DATA (LABS, IMAGING, TESTING) - I reviewed patient records, labs, notes, testing and imaging myself where available.  Lab Results  Component Value Date   WBC 8.8 10/06/2017   HGB 14.8 10/06/2017   HCT 44.7 10/06/2017   MCV 92 10/06/2017   PLT 248 10/06/2017      Component Value Date/Time   NA 141 05/13/2018 0839   K 3.8 05/13/2018 0839   CL 104 05/13/2018 0839   CO2 20 05/13/2018 0839   GLUCOSE 101 (H) 05/13/2018 0839   GLUCOSE 110 (H) 12/17/2014 0353   BUN 6 05/13/2018 0839   CREATININE 0.89 05/13/2018 0839   CALCIUM 9.1 05/13/2018 0839   PROT 6.9 05/13/2018 0839   ALBUMIN 4.5 05/13/2018 0839   AST 14 05/13/2018 0839   ALT 16 05/13/2018 0839   ALKPHOS 141 (H) 05/13/2018 0839   BILITOT 0.3 05/13/2018 0839   GFRNONAA 72 05/13/2018 0839   GFRAA 83 05/13/2018 0839   Lab Results  Component Value Date   CHOL 257 (H) 03/02/2016   HDL 41.90 03/02/2016   LDLDIRECT 199.0 03/02/2016   TRIG 205.0 (H)  03/02/2016   CHOLHDL 6 03/02/2016   No results found for: HGBA1C Lab Results  Component Value Date   VITAMINB12 177 (L) 04/11/2015   Lab Results  Component Value Date   TSH 1.399 12/12/2014      ASSESSMENT AND PLAN 58 y.o. year old female  has a past medical history of Chronic insomnia (03/07/2015), Diverticulitis, GERD (gastroesophageal reflux disease), Hiatal hernia, LBBB (left bundle branch block), OSA on CPAP, Panic disorder (09/07/2014), Seizures ([place]), and Wrist tendonitis. here with:  1.  Seizures  Overall the patient is doing well.  She will continue on Lamictal and Trileptal.  I have ordered blood work.  The patient was advised that she can come in at her convenience to have the blood work completed.  I have advised that if she has any seizure event she should let us know.  She should not operate a motor vehicle until seizure-free for 6 months.  Patient voiced understanding.  She will follow-up in 6 months or sooner if needed.   I spent 15 minutes with the patient reviewing the patient's chart prior to the visit, discussing medication and seizure precautions.   Ward Givens, MSN, NP-C 11/16/2018, 9:02 AM [facility] [address] [place], [place] [address] [phone]

## 2018-11-17 ENCOUNTER — Telehealth: Payer: Self-pay | Admitting: Adult Health

## 2018-11-17 DIAGNOSIS — B029 Zoster without complications: Secondary | ICD-10-CM | POA: Diagnosis not present

## 2018-11-17 NOTE — Telephone Encounter (Signed)
Called pt and she has shingles, on valtrex at this time.  Her labs will be drawn when she is recovered.  I relayed that was fine.

## 2018-11-17 NOTE — Telephone Encounter (Signed)
Pt called in stating she was unable to get lab work done due to her pcp placing her on Valtrex, she is requesting a call back to discuss  CB# [phone]

## 2018-12-07 ENCOUNTER — Other Ambulatory Visit (INDEPENDENT_AMBULATORY_CARE_PROVIDER_SITE_OTHER): Payer: Self-pay

## 2018-12-07 ENCOUNTER — Other Ambulatory Visit: Payer: Self-pay

## 2018-12-07 DIAGNOSIS — Z0289 Encounter for other administrative examinations: Secondary | ICD-10-CM

## 2018-12-07 DIAGNOSIS — Z5181 Encounter for therapeutic drug level monitoring: Secondary | ICD-10-CM | POA: Diagnosis not present

## 2018-12-09 LAB — COMPREHENSIVE METABOLIC PANEL
ALT: 10 IU/L (ref 0–32)
AST: 10 IU/L (ref 0–40)
Albumin/Globulin Ratio: 1.7 (ref 1.2–2.2)
Albumin: 4.3 g/dL (ref 3.8–4.9)
Alkaline Phosphatase: 133 IU/L — ABNORMAL HIGH (ref 39–117)
BUN/Creatinine Ratio: 7 — ABNORMAL LOW (ref 9–23)
BUN: 7 mg/dL (ref 6–24)
Bilirubin Total: 0.3 mg/dL (ref 0.0–1.2)
CO2: 17 mmol/L — ABNORMAL LOW (ref 20–29)
Calcium: 9.5 mg/dL (ref 8.7–10.2)
Chloride: 106 mmol/L (ref 96–106)
Creatinine, Ser: 0.95 mg/dL (ref 0.57–1.00)
GFR calc Af Amer: 76 mL/min/{1.73_m2} (ref >59)
GFR calc non Af Amer: 66 mL/min/{1.73_m2} (ref >59)
Globulin, Total: 2.5 g/dL (ref 1.5–4.5)
Glucose: 130 mg/dL — ABNORMAL HIGH (ref 65–99)
Potassium: 3.9 mmol/L (ref 3.5–5.2)
Sodium: 141 mmol/L (ref 134–144)
Total Protein: 6.8 g/dL (ref 6.0–8.5)

## 2018-12-09 LAB — CBC WITH DIFFERENTIAL/PLATELET
Basophils Absolute: 0 10*3/uL (ref 0.0–0.2)
Basos: 0 %
EOS (ABSOLUTE): 0.1 10*3/uL (ref 0.0–0.4)
Eos: 1 %
Hematocrit: 42.4 % (ref 34.0–46.6)
Hemoglobin: 14.5 g/dL (ref 11.1–15.9)
Immature Grans (Abs): 0 10*3/uL (ref 0.0–0.1)
Immature Granulocytes: 0 %
Lymphocytes Absolute: 3 10*3/uL (ref 0.7–3.1)
Lymphs: 33 %
MCH: 30.1 pg (ref 26.6–33.0)
MCHC: 34.2 g/dL (ref 31.5–35.7)
MCV: 88 fL (ref 79–97)
Monocytes Absolute: 0.4 10*3/uL (ref 0.1–0.9)
Monocytes: 4 %
Neutrophils Absolute: 5.5 10*3/uL (ref 1.4–7.0)
Neutrophils: 62 %
Platelets: 283 10*3/uL (ref 150–450)
RBC: 4.81 x10E6/uL (ref 3.77–5.28)
RDW: 13.6 % (ref 11.7–15.4)
WBC: 9 10*3/uL (ref 3.4–10.8)

## 2018-12-09 LAB — LAMOTRIGINE LEVEL: Lamotrigine Lvl: 9.7 ug/mL (ref 2.0–20.0)

## 2018-12-09 LAB — 10-HYDROXYCARBAZEPINE: Oxcarbazepine SerPl-Mcnc: 20 ug/mL (ref 10–35)

## 2018-12-12 NOTE — Telephone Encounter (Signed)
Relayed to pt that lab results consistent with previous lab work.  She verbalized understanding.

## 2018-12-12 NOTE — Telephone Encounter (Signed)
-----   Message from Ward Givens, NP sent at 12/12/2018  1:03 PM EDT ----- Lab work is consistent with previous lab work.  Please call patient with results

## 2019-01-05 ENCOUNTER — Ambulatory Visit (INDEPENDENT_AMBULATORY_CARE_PROVIDER_SITE_OTHER): Payer: Medicare HMO | Admitting: *Deleted

## 2019-01-05 DIAGNOSIS — I442 Atrioventricular block, complete: Secondary | ICD-10-CM

## 2019-01-05 LAB — CUP PACEART REMOTE DEVICE CHECK
Battery Remaining Longevity: 92 mo
Battery Voltage: 3.02 V
Brady Statistic AP VP Percent: 0 %
Brady Statistic AP VS Percent: 0.57 %
Brady Statistic AS VP Percent: 0.26 %
Brady Statistic AS VS Percent: 99.17 %
Brady Statistic RA Percent Paced: 0.57 %
Brady Statistic RV Percent Paced: 0.26 %
Date Time Interrogation Session: 20200730112325
Implantable Lead Implant Date: 20160707
Implantable Lead Implant Date: 20160707
Implantable Lead Location: 753859
Implantable Lead Location: 753860
Implantable Lead Model: 5076
Implantable Lead Model: 5076
Implantable Pulse Generator Implant Date: 20160707
Lead Channel Impedance Value: 361 Ohm
Lead Channel Impedance Value: 380 Ohm
Lead Channel Impedance Value: 741 Ohm
Lead Channel Impedance Value: 779 Ohm
Lead Channel Pacing Threshold Amplitude: 0.5 V
Lead Channel Pacing Threshold Amplitude: 0.5 V
Lead Channel Pacing Threshold Pulse Width: 0.4 ms
Lead Channel Pacing Threshold Pulse Width: 0.4 ms
Lead Channel Sensing Intrinsic Amplitude: 25.875 mV
Lead Channel Sensing Intrinsic Amplitude: 4.125 mV
Lead Channel Setting Pacing Amplitude: 1.5 V
Lead Channel Setting Pacing Amplitude: 2.5 V
Lead Channel Setting Pacing Pulse Width: 0.4 ms
Lead Channel Setting Sensing Sensitivity: 1.2 mV

## 2019-01-13 ENCOUNTER — Encounter: Payer: Self-pay | Admitting: Cardiology

## 2019-01-13 NOTE — Progress Notes (Signed)
Remote pacemaker transmission.   

## 2019-01-27 DIAGNOSIS — M10042 Idiopathic gout, left hand: Secondary | ICD-10-CM | POA: Diagnosis not present

## 2019-02-15 ENCOUNTER — Other Ambulatory Visit: Payer: Self-pay | Admitting: Neurology

## 2019-02-22 ENCOUNTER — Encounter: Payer: Self-pay | Admitting: Gastroenterology

## 2019-02-27 ENCOUNTER — Encounter: Payer: Self-pay | Admitting: Gastroenterology

## 2019-02-27 ENCOUNTER — Telehealth: Payer: Self-pay | Admitting: Gastroenterology

## 2019-02-28 ENCOUNTER — Telehealth: Payer: Self-pay | Admitting: *Deleted

## 2019-02-28 ENCOUNTER — Other Ambulatory Visit: Payer: Self-pay

## 2019-02-28 DIAGNOSIS — Z1159 Encounter for screening for other viral diseases: Secondary | ICD-10-CM

## 2019-02-28 DIAGNOSIS — Z86010 Personal history of colonic polyps: Secondary | ICD-10-CM

## 2019-02-28 NOTE — Telephone Encounter (Signed)
Dr Silverio Decamp,  I Spoke with Lucretia Kern CRNA- Pt had a ER physician intubate her in 2016 emergently  and stated difficult intubation due to small airway and large tongue - 12-12-2014 Dr Sharol Given -  Jenny Reichmann states this pt cannot have her colon here in the [place] due to this  - Last colon 2017 was done here but nothing was documented about a difficult airway/ intubation in the OV note with PA or here at her procedure.  Do you want her to have an OV first ?  Or direct at hospital?  Please  advise, thanks, Marijean Niemann

## 2019-02-28 NOTE — Telephone Encounter (Signed)
John,  This is a high priority note- this pt is scheduled for a PV tomorrow, WED 9-23 - in her chart when you pull her up there is a red line stating difficult intubation- pt tells me the only time she was intubated was 12-2014 emergently after a seizure and the need for a pacemaker- she was put into an induced coma- I cannot find any documentation of a difficult airway-   Pt states she was never given a letter or card stating this- she had a colon in the [place] in 2017 by Dr Silverio Decamp, after this occurrence.  PLEASE review her chart and let me know if you see this noted as a difficult intubation.    Thanks, Lelan Pons

## 2019-02-28 NOTE — Telephone Encounter (Signed)
I spoke with the patient. She states she did not follow through with the surgical referral. She cancelled her appointment because of her insurance issue.  I have put her on the hospital schedule 04/04/19.

## 2019-02-28 NOTE — Telephone Encounter (Signed)
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 She hasnt followed up based on chart review, can you please check and let me know. Thanks

## 2019-02-28 NOTE — Telephone Encounter (Signed)
Pt aware to keep PV 9-23 10 am- marie

## 2019-02-28 NOTE — Telephone Encounter (Signed)
Please schedule at hospital next available appointment as direct procedure. Thanks

## 2019-03-01 ENCOUNTER — Other Ambulatory Visit: Payer: Self-pay

## 2019-03-01 ENCOUNTER — Ambulatory Visit (AMBULATORY_SURGERY_CENTER): Payer: Self-pay | Admitting: *Deleted

## 2019-03-01 VITALS — Temp 96.9°F | Ht 62.0 in | Wt 174.0 lb

## 2019-03-01 DIAGNOSIS — Z86010 Personal history of colonic polyps: Secondary | ICD-10-CM

## 2019-03-01 MED ORDER — NA SULFATE-K SULFATE-MG SULF 17.5-3.13-1.6 GM/177ML PO SOLN: ORAL | 0 refills | Status: DC

## 2019-03-01 NOTE — Progress Notes (Signed)
Patient is here in-person for PV. Patient denies any allergies to eggs or soy. Patient denies any problems with anesthesia/sedation. Patient has a difficult airway. Patient denies any oxygen use at home. Patient denies taking any diet/weight loss medications or blood thinners. Patient is not being treated for MRSA or C-diff.  Patient request Suprep, she states she will not be able to drink the Golyt[place]. Patient is aware of Covid testing on 03/31/2019.

## 2019-03-15 ENCOUNTER — Encounter: Payer: Medicare HMO | Admitting: Gastroenterology

## 2019-03-21 ENCOUNTER — Encounter: Payer: Self-pay | Admitting: Gastroenterology

## 2019-03-30 ENCOUNTER — Telehealth: Payer: Self-pay | Admitting: Adult Health

## 2019-03-30 NOTE — Telephone Encounter (Signed)
I called pt and she is going to have a colonoscopy is asking about her sz meds.  I deferred her to her GI MD who is doing the procedure, but she wanted me to ask about sz meds.  She takes 8-8.  Will be for procedure Monday from 6p to tues am.  Please advise.

## 2019-03-30 NOTE — Telephone Encounter (Signed)
Pt called and LVM stating she is wanting to discuss her medications with the RN. Please advise.

## 2019-03-31 ENCOUNTER — Other Ambulatory Visit: Payer: Self-pay

## 2019-03-31 ENCOUNTER — Other Ambulatory Visit (HOSPITAL_COMMUNITY)
Admission: RE | Admit: 2019-03-31 | Discharge: 2019-03-31 | Disposition: A | Discharge status: Home / Self Care | Payer: Medicare HMO | Source: Ambulatory Visit | Attending: Gastroenterology | Admitting: Gastroenterology

## 2019-03-31 DIAGNOSIS — Z20828 Contact with and (suspected) exposure to other viral communicable diseases: Secondary | ICD-10-CM | POA: Insufficient documentation

## 2019-03-31 DIAGNOSIS — Z01812 Encounter for preprocedural laboratory examination: Secondary | ICD-10-CM | POA: Diagnosis not present

## 2019-04-01 LAB — NOVEL CORONAVIRUS, NAA (HOSP ORDER, SEND-OUT TO REF LAB; TAT 18-24 HRS): SARS-CoV-2, NAA: NOT DETECTED

## 2019-04-03 NOTE — Progress Notes (Signed)
Spoke with patient, has quarantined without symptoms, answered questions, arrival time 2

## 2019-04-03 NOTE — Telephone Encounter (Signed)
I called pt and relayed that per MM/NP to see what GI wants her to do relating her her sz meds.  She will do this.

## 2019-04-03 NOTE — Telephone Encounter (Signed)
Patient needs to consult with GI doc to see if they are ok with her taking her morning seizure medications.

## 2019-04-04 ENCOUNTER — Ambulatory Visit (HOSPITAL_COMMUNITY): Payer: Medicare HMO | Admitting: Certified Registered Nurse Anesthetist

## 2019-04-04 ENCOUNTER — Encounter (HOSPITAL_COMMUNITY)
Admission: RE | Disposition: A | Discharge status: Home / Self Care | Payer: Self-pay | Source: Home / Self Care | Attending: Gastroenterology

## 2019-04-04 ENCOUNTER — Other Ambulatory Visit: Payer: Self-pay

## 2019-04-04 ENCOUNTER — Encounter (HOSPITAL_COMMUNITY): Payer: Self-pay

## 2019-04-04 ENCOUNTER — Ambulatory Visit (HOSPITAL_COMMUNITY)
Admission: RE | Admit: 2019-04-04 | Discharge: 2019-04-04 | Disposition: A | Discharge status: Home / Self Care | Payer: Medicare HMO | Attending: Gastroenterology | Admitting: Gastroenterology

## 2019-04-04 DIAGNOSIS — Z79899 Other long term (current) drug therapy: Secondary | ICD-10-CM | POA: Insufficient documentation

## 2019-04-04 DIAGNOSIS — Z7982 Long term (current) use of aspirin: Secondary | ICD-10-CM | POA: Insufficient documentation

## 2019-04-04 DIAGNOSIS — G4733 Obstructive sleep apnea (adult) (pediatric): Secondary | ICD-10-CM | POA: Diagnosis not present

## 2019-04-04 DIAGNOSIS — I447 Left bundle-branch block, unspecified: Secondary | ICD-10-CM | POA: Diagnosis not present

## 2019-04-04 DIAGNOSIS — Z881 Allergy status to other antibiotic agents status: Secondary | ICD-10-CM | POA: Insufficient documentation

## 2019-04-04 DIAGNOSIS — K635 Polyp of colon: Secondary | ICD-10-CM | POA: Diagnosis not present

## 2019-04-04 DIAGNOSIS — Z1159 Encounter for screening for other viral diseases: Secondary | ICD-10-CM

## 2019-04-04 DIAGNOSIS — K648 Other hemorrhoids: Secondary | ICD-10-CM | POA: Diagnosis not present

## 2019-04-04 DIAGNOSIS — Z888 Allergy status to other drugs, medicaments and biological substances status: Secondary | ICD-10-CM | POA: Diagnosis not present

## 2019-04-04 DIAGNOSIS — K219 Gastro-esophageal reflux disease without esophagitis: Secondary | ICD-10-CM | POA: Insufficient documentation

## 2019-04-04 DIAGNOSIS — F5104 Psychophysiologic insomnia: Secondary | ICD-10-CM | POA: Diagnosis not present

## 2019-04-04 DIAGNOSIS — D121 Benign neoplasm of appendix: Secondary | ICD-10-CM | POA: Diagnosis not present

## 2019-04-04 DIAGNOSIS — F17210 Nicotine dependence, cigarettes, uncomplicated: Secondary | ICD-10-CM | POA: Insufficient documentation

## 2019-04-04 DIAGNOSIS — Z885 Allergy status to narcotic agent status: Secondary | ICD-10-CM | POA: Insufficient documentation

## 2019-04-04 DIAGNOSIS — K5730 Diverticulosis of large intestine without perforation or abscess without bleeding: Secondary | ICD-10-CM | POA: Insufficient documentation

## 2019-04-04 DIAGNOSIS — Z86010 Personal history of colonic polyps: Secondary | ICD-10-CM

## 2019-04-04 DIAGNOSIS — Z950 Presence of cardiac pacemaker: Secondary | ICD-10-CM | POA: Diagnosis not present

## 2019-04-04 DIAGNOSIS — R569 Unspecified convulsions: Secondary | ICD-10-CM | POA: Diagnosis not present

## 2019-04-04 DIAGNOSIS — Z880 Allergy status to penicillin: Secondary | ICD-10-CM | POA: Insufficient documentation

## 2019-04-04 DIAGNOSIS — K621 Rectal polyp: Secondary | ICD-10-CM | POA: Diagnosis not present

## 2019-04-04 DIAGNOSIS — I442 Atrioventricular block, complete: Secondary | ICD-10-CM | POA: Diagnosis not present

## 2019-04-04 DIAGNOSIS — Z886 Allergy status to analgesic agent status: Secondary | ICD-10-CM | POA: Insufficient documentation

## 2019-04-04 DIAGNOSIS — F419 Anxiety disorder, unspecified: Secondary | ICD-10-CM | POA: Diagnosis not present

## 2019-04-04 HISTORY — PX: COLONOSCOPY WITH PROPOFOL: SHX5780

## 2019-04-04 HISTORY — PX: POLYPECTOMY: SHX5525

## 2019-04-04 SURGERY — COLONOSCOPY WITH PROPOFOL
Anesthesia: Monitor Anesthesia Care

## 2019-04-04 MED ORDER — SODIUM CHLORIDE 0.9 % IV SOLN: INTRAVENOUS | Status: DC

## 2019-04-04 MED ORDER — PROPOFOL 500 MG/50ML IV EMUL
INTRAVENOUS | Status: AC
  Filled 2019-04-04: qty 50

## 2019-04-04 MED ORDER — LACTATED RINGERS IV SOLN
INTRAVENOUS | Status: DC
  Administered 2019-04-04: 11:00:00 via INTRAVENOUS

## 2019-04-04 MED ORDER — PROPOFOL 10 MG/ML IV BOLUS
INTRAVENOUS | Status: DC | PRN
  Administered 2019-04-04: 50 mg via INTRAVENOUS
  Administered 2019-04-04: 20 mg via INTRAVENOUS

## 2019-04-04 MED ORDER — PROPOFOL 500 MG/50ML IV EMUL
INTRAVENOUS | Status: DC | PRN
  Administered 2019-04-04: 50 ug/kg/min via INTRAVENOUS

## 2019-04-04 SURGICAL SUPPLY — 22 items

## 2019-04-04 NOTE — Discharge Instructions (Signed)

## 2019-04-04 NOTE — Anesthesia Procedure Notes (Signed)
Procedure Name: MAC Date/Time: 04/04/2019 11:28 AM Performed by: Deliah Boston, CRNA Pre-anesthesia Checklist: Patient identified, Emergency Drugs available, Suction available and Patient being monitored Patient Re-evaluated:Patient Re-evaluated prior to induction Oxygen Delivery Method: Simple face mask Preoxygenation: Pre-oxygenation with 100% oxygen Induction Type: IV induction Placement Confirmation: positive ETCO2 and breath sounds checked- equal and bilateral

## 2019-04-04 NOTE — Anesthesia Preprocedure Evaluation (Signed)
Anesthesia Evaluation  Patient identified by MRN, date of birth, ID band Patient awake    Reviewed: Allergy & Precautions, NPO status , Patient's Chart, lab work & pertinent test results  Airway Mallampati: I  TM Distance: >3 FB Neck ROM: Full    Dental   Pulmonary Current Smoker,    Pulmonary exam normal        Cardiovascular Normal cardiovascular exam+ dysrhythmias + pacemaker      Neuro/Psych Seizures -, Well Controlled,  Anxiety    GI/Hepatic GERD  Medicated,  Endo/Other    Renal/GU      Musculoskeletal   Abdominal   Peds  Hematology   Anesthesia Other Findings   Reproductive/Obstetrics                             Anesthesia Physical Anesthesia Plan  ASA: III  Anesthesia Plan: MAC   Post-op Pain Management:    Induction: Intravenous  PONV Risk Score and Plan: 1 and Treatment may vary due to age or medical condition  Airway Management Planned: Simple Face Mask  Additional Equipment:   Intra-op Plan:   Post-operative Plan:   Informed Consent: I have reviewed the patients History and Physical, chart, labs and discussed the procedure including the risks, benefits and alternatives for the proposed anesthesia with the patient or authorized representative who has indicated his/her understanding and acceptance.       Plan Discussed with: CRNA and Surgeon  Anesthesia Plan Comments:         Anesthesia Quick Evaluation

## 2019-04-04 NOTE — Op Note (Signed)
[facility] Patient Name: [name] [name] Procedure Date: 04/04/2019 MRN: [id number] Attending MD: Mauri Pole , MD Date of Birth: 11/07/1960 CSN: [id number] Age: 58 Admit Type: Outpatient Procedure:                Colonoscopy Indications:              High risk colon cancer surveillance: Personal                            history of colonic polyps, High risk colon cancer                            surveillance: Personal history of multiple (3 or                            more) adenomas Providers:                Mauri Pole, MD, Ashley Jacobs, RN, Lina Sar, Technician, Heide Scales, CRNA Referring MD:              Medicines:                Monitored Anesthesia Care Complications:            No immediate complications. Estimated Blood Loss:     Estimated blood loss was minimal. Procedure:                Pre-Anesthesia Assessment:                           - Prior to the procedure, a History and Physical                            was performed, and patient medications and                            allergies were reviewed. The patient's tolerance of                            previous anesthesia was also reviewed. The risks                            and benefits of the procedure and the sedation                            options and risks were discussed with the patient.                            All questions were answered, and informed consent                            was obtained. Prior Anticoagulants: The patient has  taken no previous anticoagulant or antiplatelet                            agents. ASA Grade Assessment: II - A patient with                            mild systemic disease. After reviewing the risks                            and benefits, the patient was deemed in                            satisfactory condition to undergo the procedure.                            After obtaining informed consent, the colonoscope                            was passed under direct vision. Throughout the                            procedure, the patient's blood pressure, pulse, and                            oxygen saturations were monitored continuously. The                            [id number]) Olympus pediatric colonscope                            was introduced through the anus and advanced to the                            the terminal ileum, with identification of the                            appendiceal orifice and IC valve. The colonoscopy                            was performed without difficulty. The patient                            tolerated the procedure well. The quality of the                            bowel preparation was excellent. The ileocecal                            valve, appendiceal orifice, and rectum were                            photographed. Scope In: 11:33:36 AM Scope Out: 11:53:29 AM Scope Withdrawal Time: 0 hours 15 minutes 18 seconds  Total Procedure Duration: 0 hours 19 minutes  53 seconds  Findings:      The perianal and digital rectal examinations were normal.      A 12 mm polyp was found in the appendiceal orifice. The polyp was       granular lateral spreading.      A 3 mm polyp was found in the rectum. The polyp was sessile. The polyp       was removed with a cold snare. Resection and retrieval were complete.      A few small-mouthed diverticula were found in the sigmoid colon.      Non-bleeding internal hemorrhoids were found during retroflexion. The       hemorrhoids were small. Impression:               - One 12 mm polyp at the appendiceal orifice.                           - One 3 mm polyp in the rectum, removed with a cold                            snare. Resected and retrieved.                           - Diverticulosis in the sigmoid colon.                           - Non-bleeding internal  hemorrhoids. Moderate Sedation:      Not Applicable - Patient had care per Anesthesia. Recommendation:           - Patient has a contact number available for                            emergencies. The signs and symptoms of potential                            delayed complications were discussed with the                            patient. Return to normal activities tomorrow.                            Written discharge instructions were provided to the                            patient.                           - Resume previous diet.                           - Continue present medications.                           - Await pathology results.                           - Repeat colonoscopy at appointment to be scheduled  for EMR of appendiceal orifice polyp, will discuss                            with Dr Elita Boone, if not feasible will refer to                            surgery for resection. Procedure Code(s):        --- Professional ---                           [phone], Colonoscopy, flexible; with removal of                            tumor(s), polyp(s), or other lesion(s) by snare                            technique Diagnosis Code(s):        --- Professional ---                           K63.5, Polyp of colon                           K62.1, Rectal polyp                           K64.8, Other hemorrhoids                           Z86.010, Personal history of colonic polyps                           K57.30, Diverticulosis of large intestine without                            perforation or abscess without bleeding CPT copyright 2019 American Medical Association. All rights reserved. The codes documented in this report are preliminary and upon coder review may  be revised to meet current compliance requirements. Mauri Pole, MD 04/04/2019 12:05:35 PM This report has been signed electronically. Number of Addenda: 0

## 2019-04-04 NOTE — Anesthesia Postprocedure Evaluation (Signed)
Anesthesia Post Note  Patient: [name] [name]  Procedure(s) Performed: COLONOSCOPY WITH PROPOFOL (N/A ) POLYPECTOMY     Patient location during evaluation: PACU Anesthesia Type: MAC Level of consciousness: awake and alert Pain management: pain level controlled Vital Signs Assessment: post-procedure vital signs reviewed and stable Respiratory status: spontaneous breathing, nonlabored ventilation, respiratory function stable and patient connected to nasal cannula oxygen Cardiovascular status: stable and blood pressure returned to baseline Postop Assessment: no apparent nausea or vomiting Anesthetic complications: no    Last Vitals:  Vitals:   04/04/19 1210 04/04/19 1217  BP: 140/67 (!) 143/65  Pulse: 76 63  Resp: 19 17  Temp:    SpO2: 100% 100%    Last Pain:  Vitals:   04/04/19 1158  TempSrc: Temporal  PainSc: 0-No pain                 Alauna Hayden DAVID

## 2019-04-04 NOTE — H&P (Signed)
[facility] History and Physical   Primary Care Physician:  Aretta Nip, MD   Reason for Procedure:   History of colon polyps  Plan:    Colonoscopy with possible intervention     HPI: SHADAWN SCHWERING is a 58 y.o. female here for surveillance colonoscopy.Last colonoscopy with polyp extending into appendiceal orifice, patient was referred to surgery for extended appendectomy but she did not follow up.  Denies any nausea, vomiting, abdominal pain, melena or bright red blood per rectum  The risks and benefits as well as alternatives of endoscopic procedure(s) have been discussed and reviewed. All questions answered. The patient agrees to proceed.    Past Medical History:  Diagnosis Date  . Chronic insomnia 03/07/2015  . Diverticulitis   . GERD (gastroesophageal reflux disease)   . Hiatal hernia   . LBBB (left bundle branch block)   . OSA on CPAP   . Pacemaker   . Panic disorder 09/07/2014  . Seizures ([place])    last Seizure 1 1/2 month ago= (about August 2020)  . Sleep apnea    does not use a CPAP  . Wrist tendonitis    R     Past Surgical History:  Procedure Laterality Date  . COLONOSCOPY  last 03/31/2016  . EP IMPLANTABLE DEVICE N/A 12/13/2014   Procedure: Pacemaker Implant;  Surgeon: Deboraha Sprang, MD;  Location: [facility];  Service: Cardiovascular;  Laterality: N/A;  . HAND SURGERY    . POLYPECTOMY    . TUBAL LIGATION      Prior to Admission medications   Medication Sig Start Date End Date Taking? Authorizing Provider  acetaminophen (TYLENOL) 500 MG tablet Take 500-1,000 mg by mouth every 6 (six) hours as needed (pain).   Yes [provider]  Cholecalciferol (VITAMIN D3) 2000 units capsule Take 1 capsule (2,000 Units total) by mouth daily. 03/17/16  Yes Golden Circle, FNP  lamoTRIgine (LAMICTAL) 200 MG tablet Take 1 tablet (200 mg total) by mouth 2 (two) times daily. 10/06/18  Yes Ward Givens, NP  lamoTRIgine (LAMICTAL) 25  MG tablet TAKE 2 TABLETS (50 MG TOTAL) BY MOUTH DAILY (CANCEL 50MG  TABLET RX) Patient taking differently: Take 50 mg by mouth at bedtime.  02/16/19  Yes [place], [name], NP  Na Sulfate-K Sulfate-Mg Sulf 17.5-3.13-1.6 GM/177ML SOLN Suprep (no substitutions)-TAKE AS DIRECTED. 03/01/19  Yes Toryn Dewalt, Venia Minks, MD  Nutritional Supplements (ESTROVEN PO) Take 1 tablet by mouth daily.    Yes [provider]  omeprazole (PRILOSEC) 20 MG capsule Take 20 mg by mouth daily.   Yes [provider]  Oxcarbazepine (TRILEPTAL) 300 MG tablet TAKE 1 TABLET TWICE DAILY Patient taking differently: Take 300 mg by mouth 2 (two) times daily.  10/06/18  Yes Kathrynn Ducking, MD  vitamin B-12 (CYANOCOBALAMIN) 1000 MCG tablet Take 1 tablet (1,000 mcg total) by mouth daily. 07/09/15  Yes Ward Givens, NP    Current Facility-Administered Medications  Medication Dose Route Frequency Provider Last Rate Last Dose  . 0.9 %  sodium chloride infusion   Intravenous Continuous Knowledge Escandon V, MD      . lactated ringers infusion   Intravenous Continuous Mauri Pole, MD 10 mL/hr at 04/04/19 1043      Allergies as of 02/28/2019 - Review Complete 11/16/2018  Allergen Reaction Noted  . Zonisamide Anxiety 10/11/2014  . Aspirin  01/21/2012  . Ciprofloxacin  01/21/2012  . Codeine Hives 01/21/2012  . Hydrocodone Hives 12/12/2014  . Oxycodone Hives  12/12/2014  . Penicillins Hives 01/21/2012    Family History  Problem Relation Age of Onset  . Alzheimer's disease Mother   . Hypertension Mother   . Colon polyps Mother   . Hypertension Father   . Stroke Father   . Leukemia Father   . Prostate cancer Father   . Colon polyps Sister   . Stroke Brother   . Hypertension Brother   . Sleep apnea Brother   . Colon polyps Brother   . Kidney failure Sister   . Diabetes Sister   . Colon polyps Sister   . Coronary artery disease Maternal Grandfather   . Parkinson's disease Maternal Grandmother   .  Colon cancer Paternal Aunt   . Esophageal cancer Neg Hx   . Stomach cancer Neg Hx   . Rectal cancer Neg Hx     Social History   Socioeconomic History  . Marital status: Single    Spouse name: Not on file  . Number of children: 0  . Years of education: 53  . Highest education level: Not on file  Occupational History  . Not on file  Social Needs  . Financial resource strain: Not on file  . Food insecurity    Worry: Not on file    Inability: Not on file  . Transportation needs    Medical: Not on file    Non-medical: Not on file  Tobacco Use  . Smoking status: Current Every Day Smoker    Packs/day: 0.25    Years: 35.00    Pack years: 8.75    Types: Cigarettes  . Smokeless tobacco: Never Used  Substance and Sexual Activity  . Alcohol use: No    Alcohol/week: 0.0 standard drinks    Comment: Quit 12/06/2009  . Drug use: No  . Sexual activity: Not on file  Lifestyle  . Physical activity    Days per week: Not on file    Minutes per session: Not on file  . Stress: Not on file  Relationships  . Social Herbalist on phone: Not on file    Gets together: Not on file    Attends religious service: Not on file    Active member of club or organization: Not on file    Attends meetings of clubs or organizations: Not on file    Relationship status: Not on file  . Intimate partner violence    Fear of current or ex partner: Not on file    Emotionally abused: Not on file    Physically abused: Not on file    Forced sexual activity: Not on file  Other Topics Concern  . Not on file  Social History Narrative   Patient drinks 24 oz of soda daily.   Lives with sister   Some college   Left handed    Review of Systems:  All other review of systems negative except as mentioned in the HPI.  Physical Exam: Vital signs in last 24 hours: Temp:  [98.3 F (36.8 C)] 98.3 F (36.8 C) (10/27 1040) Pulse Rate:  [75] 75 (10/27 1040) Resp:  [17] 17 (10/27 1040) BP: (162)/(65)  162/65 (10/27 1040) SpO2:  [99 %] 99 % (10/27 1040) Weight:  [78.5 kg] 78.5 kg (10/27 1040)   General:   Alert,  Well-developed, well-nourished, pleasant and cooperative in NAD Lungs:  Clear throughout to auscultation.   Heart:  Regular rate and rhythm; no murmurs, clicks, rubs,  or gallops. Abdomen:  Soft, nontender and  nondistended. Normal bowel sounds.   Neuro/Psych:  Alert and cooperative. Normal mood and affect. A and O x 3   K. Denzil Magnuson , MD [phone]

## 2019-04-04 NOTE — Transfer of Care (Signed)
Immediate Anesthesia Transfer of Care Note  Patient: [name] [name]  Procedure(s) Performed: Procedure(s): COLONOSCOPY WITH PROPOFOL (N/A) POLYPECTOMY  Patient Location: PACU  Anesthesia Type:MAC  Level of Consciousness: Patient easily awoken, sedated, comfortable, cooperative, following commands, responds to stimulation.   Airway & Oxygen Therapy: Patient spontaneously breathing, ventilating well, oxygen via simple oxygen mask.  Post-op Assessment: Report given to PACU RN, vital signs reviewed and stable, moving all extremities.   Post vital signs: Reviewed and stable.  Complications: No apparent anesthesia complications  Last Vitals:  Vitals Value Taken Time  BP    Temp    Pulse 72 04/04/19 1158  Resp 17 04/04/19 1158  SpO2 98 % 04/04/19 1158  Vitals shown include unvalidated device data.  Last Pain:  Vitals:   04/04/19 1040  TempSrc: Oral  PainSc: 0-No pain         Complications: No apparent anesthesia complications

## 2019-04-05 ENCOUNTER — Telehealth: Payer: Self-pay

## 2019-04-05 ENCOUNTER — Encounter (HOSPITAL_COMMUNITY): Payer: Self-pay | Admitting: Gastroenterology

## 2019-04-05 LAB — SURGICAL PATHOLOGY

## 2019-04-05 NOTE — Telephone Encounter (Signed)
-----   Message from Irving Copas., MD sent at 04/05/2019  3:50 AM EDT ----- Demetra Shiner [place] move forward with scheduling a clinic visit.Rosalie Gelpi/Beth, please move forward with scheduling a clinic visit with me to discuss advanced polyp resection techniques and to move forward with scheduling a colonoscopy with EMR time slot.Thanks.GM ----- Message ----- From: Mauri Pole, MD Sent: 04/04/2019   3:50 PM EDT To: Greggory Keen, LPN, #  Thanks Gabe, I feel it is involving the AO, she has a somewhat deep AO, one of the pictures where I circled it gives a better idea.  If you feel attempt at EMR is reasonable, I think patient may prefer it.   Shall we set up an office visit with you so you can discuss it with the patient? VN ----- Message ----- From: Irving Copas., MD Sent: 04/04/2019  12:32 PM EDT To: Greggory Keen, LPN, Mauri Pole, MD  [facility], Looking at the pictures, it looks like the true AO os is not involved but everything surrounding it is - is that correct? I think an attempt at EMR is reasonable and we can lift and see what happens.. If we have issues we could also consider a FTRD OVESCO to the region. It has been done in patients who have polyp extending into the os of the AO. There is a slight risk of appendicitis but we give antibiotics to try and decrease that risk. I appreciate your answer so that we can decide what may be best for her. An appendectomy and cecectomy is not too significant for some patients if they are good candidates. So I think there are options. Appreciate your reply. GM ----- Message ----- From: Mauri Pole, MD Sent: 04/04/2019  12:09 PM EDT To: Greggory Keen, LPN, #  Gabe,  Can you please review the images, she has appendiceal orifice polyp, is this something you can EMR?? I referred her for appendectomy 3 years ago when it was much smaller but she did not do it and was lost to follow up. If unable to EMR, I  will refer to surgery again.  Thanks [employer]

## 2019-04-05 NOTE — Telephone Encounter (Signed)
05/24/19 at 1:30 pm appt with Dr Rush Landmark.  Letter mailed to the pt with appt info

## 2019-04-06 ENCOUNTER — Ambulatory Visit (INDEPENDENT_AMBULATORY_CARE_PROVIDER_SITE_OTHER): Payer: Medicare HMO | Admitting: *Deleted

## 2019-04-06 DIAGNOSIS — I442 Atrioventricular block, complete: Secondary | ICD-10-CM | POA: Diagnosis not present

## 2019-04-06 DIAGNOSIS — I447 Left bundle-branch block, unspecified: Secondary | ICD-10-CM

## 2019-04-06 LAB — CUP PACEART REMOTE DEVICE CHECK
Battery Remaining Longevity: 87 mo
Battery Voltage: 3.01 V
Brady Statistic AP VP Percent: 0 %
Brady Statistic AP VS Percent: 1 %
Brady Statistic AS VP Percent: 0.32 %
Brady Statistic AS VS Percent: 98.68 %
Brady Statistic RA Percent Paced: 1 %
Brady Statistic RV Percent Paced: 0.32 %
Date Time Interrogation Session: 20201029120603
Implantable Lead Implant Date: 20160707
Implantable Lead Implant Date: 20160707
Implantable Lead Location: 753859
Implantable Lead Location: 753860
Implantable Lead Model: 5076
Implantable Lead Model: 5076
Implantable Pulse Generator Implant Date: 20160707
Lead Channel Impedance Value: 380 Ohm
Lead Channel Impedance Value: 399 Ohm
Lead Channel Impedance Value: 760 Ohm
Lead Channel Impedance Value: 798 Ohm
Lead Channel Pacing Threshold Amplitude: 0.5 V
Lead Channel Pacing Threshold Amplitude: 0.5 V
Lead Channel Pacing Threshold Pulse Width: 0.4 ms
Lead Channel Pacing Threshold Pulse Width: 0.4 ms
Lead Channel Sensing Intrinsic Amplitude: 24.25 mV
Lead Channel Sensing Intrinsic Amplitude: 24.25 mV
Lead Channel Sensing Intrinsic Amplitude: 3.75 mV
Lead Channel Sensing Intrinsic Amplitude: 3.75 mV
Lead Channel Setting Pacing Amplitude: 1.5 V
Lead Channel Setting Pacing Amplitude: 2.5 V
Lead Channel Setting Pacing Pulse Width: 0.4 ms
Lead Channel Setting Sensing Sensitivity: 1.2 mV

## 2019-04-11 ENCOUNTER — Encounter: Payer: Self-pay | Admitting: Gastroenterology

## 2019-04-30 NOTE — Progress Notes (Signed)
Remote pacemaker transmission.   

## 2019-05-19 ENCOUNTER — Encounter: Payer: Medicare HMO | Admitting: Internal Medicine

## 2019-05-24 ENCOUNTER — Encounter: Payer: Self-pay | Admitting: Gastroenterology

## 2019-05-24 ENCOUNTER — Encounter: Payer: Self-pay | Admitting: Adult Health

## 2019-05-24 ENCOUNTER — Other Ambulatory Visit: Payer: Self-pay

## 2019-05-24 ENCOUNTER — Telehealth (INDEPENDENT_AMBULATORY_CARE_PROVIDER_SITE_OTHER): Payer: Medicare HMO | Admitting: Gastroenterology

## 2019-05-24 ENCOUNTER — Telehealth: Payer: Medicare HMO | Admitting: Gastroenterology

## 2019-05-24 ENCOUNTER — Telehealth: Payer: Medicare HMO | Admitting: Family

## 2019-05-24 ENCOUNTER — Ambulatory Visit: Payer: Medicare HMO | Admitting: Adult Health

## 2019-05-24 ENCOUNTER — Ambulatory Visit: Payer: Medicare HMO | Admitting: Gastroenterology

## 2019-05-24 VITALS — BP 150/80 | HR 84 | Temp 98.3°F | Ht 61.0 in | Wt 172.4 lb

## 2019-05-24 DIAGNOSIS — K635 Polyp of colon: Secondary | ICD-10-CM | POA: Diagnosis not present

## 2019-05-24 DIAGNOSIS — Z86010 Personal history of colonic polyps: Secondary | ICD-10-CM | POA: Diagnosis not present

## 2019-05-24 DIAGNOSIS — K219 Gastro-esophageal reflux disease without esophagitis: Secondary | ICD-10-CM | POA: Diagnosis not present

## 2019-05-24 MED ORDER — OMEPRAZOLE 20 MG PO CPDR: 20.0000 mg | DELAYED_RELEASE_CAPSULE | Freq: Every day | ORAL | 1 refills | Status: DC

## 2019-05-24 NOTE — Progress Notes (Signed)
We are sorry that you are not feeling well.  Here is how we plan to help!  Based on what you shared with me it looks like you most likely have Gastroesophageal Reflux Disease (GERD)  Gastroesophageal reflux disease (GERD) happens when acid from your stomach flows up into the esophagus.  When acid comes in contact with the esophagus, the acid causes sorenss (inflammation) in the esophagus.  Over time, GERD may create small holes (ulcers) in the lining of the esophagus.  I have prescribed Omeprazole 20 mg one by mouth daily until you follow up with a provider.   If your symptoms do not improve or worsen you need to be seen face to face.   Your symptoms should improve in the next day or two.  You can use antacids as needed until symptoms resolve.  Call us if your heartburn worsens, you have trouble swallowing, weight loss, spitting up blood or recurrent vomiting.  Home Care:  May include lifestyle changes such as weight loss, quitting smoking and alcohol consumption  Avoid foods and drinks that make your symptoms worse, such as:  Caffeine or alcoholic drinks  Chocolate  Peppermint or mint flavorings  Garlic and onions  Spicy foods  Citrus fruits, such as oranges, lemons, or limes  Tomato-based foods such as sauce, chili, salsa and pizza  Fried and fatty foods  Avoid lying down for 3 hours prior to your bedtime or prior to taking a nap  Eat small, frequent meals instead of a large meals  Wear loose-fitting clothing.  Do not wear anything tight around your waist that causes pressure on your stomach.  Raise the head of your bed 6 to 8 inches with wood blocks to help you sleep.  Extra pillows will not help.  Seek Help Right Away If:  You have pain in your arms, neck, jaw, teeth or back  Your pain increases or changes in intensity or duration  You develop nausea, vomiting or sweating (diaphoresis)  You develop shortness of breath or you faint  Your vomit is green, yellow,  black or looks like coffee grounds or blood  Your stool is red, bloody or black  These symptoms could be signs of other problems, such as heart disease, gastric bleeding or esophageal bleeding.  Make sure you :  Understand these instructions.  Will watch your condition.  Will get help right away if you are not doing well or get worse.  Your e-visit answers were reviewed by a board certified advanced clinical practitioner to complete your personal care plan.  Depending on the condition, your plan could have included both over the counter or prescription medications.  If there is a problem please reply  once you have received a response from your provider.  Your safety is important to [place].  If you have drug allergies check your prescription carefully.    You can use MyChart to ask questions about today's visit, request a non-urgent call back, or ask for a work or school excuse for 24 hours related to this e-Visit. If it has been greater than 24 hours you will need to follow up with your provider, or enter a new e-Visit to address those concerns.  You will get an e-mail in the next two days asking about your experience.  I hope that your e-visit has been valuable and will speed your recovery. Thank you for using e-visits.  Approximately 5 minutes was spent documenting and reviewing patient's chart.

## 2019-05-24 NOTE — Progress Notes (Signed)
[place] VISIT   Primary Care Provider Rankins, Bill Salinas, MD [place] [address] [phone]  Referring Provider Dr. Silverio Decamp  Patient Profile: [name] [name] is a 58 y.o. female with a pmh significant for insomnia, GERD, hiatal hernia, OSA, status post pacemaker, seizures, colon polyps.  The patient presents to the [facility] for an evaluation and management of problem(s) noted below:  Problem List 1. Cecal polyp   2. History of colonic polyps     I connected with  [name] on 05/25/19. I verified that I was speaking with the correct person using two identifiers. Due to the COVID-19 Pandemic, this service was provided via telemedicine using Audio/Visual Media. The patient was located at home. The provider was located in the office. The patient did consent to this visit and is aware of charges through their insurance as well as the limitations of evaluation and management by telemedicine. The patient was referred by Dr. Silverio Decamp. Other persons participating in this telemedicine service were the patient's sister. Time spent on visit was greater than 25 minutes on phone and review of patient's history and colonoscopy reports and patient coordination of care.   History of Present Illness Please see initial consultation note and progress notes by Dr. Silverio Decamp for full details of HPI.  Interval History The patient recently underwent a surveillance colonoscopy in October.  At the time a villous appearing polyp was found adjacent and potentially on or very near the appendiceal orifice.  This was not biopsied.  It has evidence endoscopically to be a adenomatous lesion.  The patient is referred for consideration of advanced endoscopic resection versus potential role of surgical intervention.  Patient has not had any other GI symptoms at this time.  She denies any significant bleeding or abdominal pain.   There is no family history of colon cancer but there are multiple family members with a history of colon polyps.  She still has her appendix.  GI Review of Systems Positive as above Negative for dysphagia, odynophagia, nausea, vomiting, change in bowel habits, melena, hematochezia  Review of Systems General: Denies fevers/chills/weight loss HEENT: Denies oral lesions Cardiovascular: Denies chest pain Pulmonary: Denies shortness of breath Gastroenterological: See HPI Genitourinary: Denies darkened urine Hematological: Denies easy bruising/bleeding Dermatological: Denies jaundice Psychological: Mood is stable   Medications Current Outpatient Medications  Medication Sig Dispense Refill  . acetaminophen (TYLENOL) 500 MG tablet Take 500-1,000 mg by mouth every 6 (six) hours as needed (pain).    . Cholecalciferol (VITAMIN D3) 2000 units capsule Take 1 capsule (2,000 Units total) by mouth daily. 90 capsule 0  . lamoTRIgine (LAMICTAL) 200 MG tablet Take 1 tablet (200 mg total) by mouth 2 (two) times daily. 180 tablet 3  . lamoTRIgine (LAMICTAL) 25 MG tablet TAKE 2 TABLETS (50 MG TOTAL) BY MOUTH DAILY (CANCEL 50MG  TABLET RX) (Patient taking differently: Take 50 mg by mouth at bedtime. ) 180 tablet 2  . Nutritional Supplements (ESTROVEN PO) Take 1 tablet by mouth daily.     Marland Kitchen omeprazole (PRILOSEC) 20 MG capsule Take 1 capsule (20 mg total) by mouth daily. 90 capsule 1  . Oxcarbazepine (TRILEPTAL) 300 MG tablet TAKE 1 TABLET TWICE DAILY (Patient taking differently: Take 300 mg by mouth 2 (two) times daily. ) 180 tablet 3   No current facility-administered medications for this visit.    Allergies Allergies  Allergen Reactions  . Zonisamide Anxiety    Panic Attacks  . Aspirin  sensitivity  . Ciprofloxacin     Seizures & it keeps her awake  . Codeine Hives  . Hydrocodone Hives  . Oxycodone Hives  . Penicillins Hives and Swelling    Did it involve swelling of the face/tongue/throat,  SOB, or low BP? Yes Did it involve sudden or severe rash/hives, skin peeling, or any reaction on the inside of your mouth or nose? Yes Did you need to seek medical attention at a hospital or doctor's office? No When did it last happen?1990's If all above answers are "NO", may proceed with cephalosporin use.     Histories Past Medical History:  Diagnosis Date  . Chronic insomnia 03/07/2015  . Diverticulitis   . GERD (gastroesophageal reflux disease)   . Hiatal hernia   . LBBB (left bundle branch block)   . OSA on CPAP   . Pacemaker   . Panic disorder 09/07/2014  . Seizures ([place])    last Seizure 1 1/2 month ago= (about August 2020)  . Sleep apnea    does not use a CPAP  . Wrist tendonitis    R    Past Surgical History:  Procedure Laterality Date  . COLONOSCOPY  last 03/31/2016  . COLONOSCOPY WITH PROPOFOL N/A 04/04/2019   Procedure: COLONOSCOPY WITH PROPOFOL;  Surgeon: Mauri Pole, MD;  Location: [facility];  Service: Endoscopy;  Laterality: N/A;  . EP IMPLANTABLE DEVICE N/A 12/13/2014   Procedure: Pacemaker Implant;  Surgeon: Deboraha Sprang, MD;  Location: [place] [facility];  Service: Cardiovascular;  Laterality: N/A;  . HAND SURGERY    . POLYPECTOMY    . POLYPECTOMY  04/04/2019   Procedure: POLYPECTOMY;  Surgeon: Mauri Pole, MD;  Location: [facility];  Service: Endoscopy;;  . TUBAL LIGATION     Social History   Socioeconomic History  . Marital status: Single    Spouse name: Not on file  . Number of children: 0  . Years of education: 8  . Highest education level: Not on file  Occupational History  . Not on file  Tobacco Use  . Smoking status: Current Every Day Smoker    Packs/day: 0.25    Years: 35.00    Pack years: 8.75    Types: Cigarettes  . Smokeless tobacco: Never Used  Substance and Sexual Activity  . Alcohol use: No    Alcohol/week: 0.0 standard drinks    Comment: Quit 12/06/2009  . Drug use: No  . Sexual activity: Not on file   Other Topics Concern  . Not on file  Social History Narrative   Patient drinks 24 oz of soda daily.   Lives with sister   Some college   Left handed   Social Determinants of Health   Financial Resource Strain:   . Difficulty of Paying Living Expenses: Not on file  Food Insecurity:   . Worried About Charity fundraiser in the Last Year: Not on file  . Ran Out of Food in the Last Year: Not on file  Transportation Needs:   . Lack of Transportation (Medical): Not on file  . Lack of Transportation (Non-Medical): Not on file  Physical Activity:   . Days of Exercise per Week: Not on file  . Minutes of Exercise per Session: Not on file  Stress:   . Feeling of Stress : Not on file  Social Connections:   . Frequency of Communication with Friends and Family: Not on file  . Frequency of Social Gatherings with Friends and Family:  Not on file  . Attends Religious Services: Not on file  . Active Member of Clubs or Organizations: Not on file  . Attends Archivist Meetings: Not on file  . Marital Status: Not on file  Intimate Partner Violence:   . Fear of Current or Ex-Partner: Not on file  . Emotionally Abused: Not on file  . Physically Abused: Not on file  . Sexually Abused: Not on file   Family History  Problem Relation Age of Onset  . Alzheimer's disease Mother   . Hypertension Mother   . Colon polyps Mother   . Hypertension Father   . Stroke Father   . Leukemia Father   . Prostate cancer Father   . Colon polyps Sister   . Stroke Brother   . Hypertension Brother   . Sleep apnea Brother   . Colon polyps Brother   . Kidney failure Sister   . Diabetes Sister   . Colon polyps Sister   . Coronary artery disease Maternal Grandfather   . Parkinson's disease Maternal Grandmother   . Colon cancer Paternal Aunt   . Esophageal cancer Neg Hx   . Stomach cancer Neg Hx   . Rectal cancer Neg Hx   . Inflammatory bowel disease Neg Hx   . Liver disease Neg Hx   . Pancreatic  cancer Neg Hx    I have reviewed her medical, social, and family history in detail and updated the electronic medical record as necessary.    PHYSICAL EXAMINATION  There were no vitals taken for this visit. Wt Readings from Last 3 Encounters:  05/24/19 172 lb 6 oz (78.2 kg)  04/04/19 173 lb 1 oz (78.5 kg)  03/01/19 174 lb (78.9 kg)  Telemedicine visit GEN: Appears on video in NAD, appears stated age PSYCH: Cooperative, without pressured speech EYE: Conjunctivae pink, sclerae anicteric RESP: CTAB posteriorly, without wheezing NEURO:  Alert & Oriented x 3   REVIEW OF DATA  I reviewed the following data at the time of this encounter:  GI Procedures and Studies  April 04, 2019 colonoscopy - One 12 mm polyp at the appendiceal orifice. - One 3 mm polyp in the rectum, removed with a cold snare. Resected and retrieved. - Diverticulosis in the sigmoid colon. - Non-bleeding internal hemorrhoids.  Laboratory Studies  Reviewed those in epic  Imaging Studies  No relevant studies to review   ASSESSMENT  Ms. Cobb is a 58 y.o. female  with a pmh significant for insomnia, GERD, hiatal hernia, OSA, status post pacemaker, seizures, colon polyps.  The patient is seen today for evaluation and management of:  1. Cecal polyp   2. History of colonic polyps    The patient is clinically and hemodynamically stable.  Based upon the description and endoscopic pictures I do feel that it is reasonable to pursue an Advanced Polypectomy attempt of the polyp/lesion.  We discussed some of the techniques of advanced polypectomy which include Endoscopic Mucosal Resection, OVESCO Full-Thickness Resection, Endorotor Morcellation, and Tissue Ablation via Fulguration.  The risks and benefits of endoscopic evaluation were discussed with the patient; these include but are not limited to the risk of perforation, infection, bleeding, missed lesions, lack of diagnosis, severe illness requiring hospitalization, as  well as anesthesia and sedation related illnesses.  During attempts at advanced resection, the risks of bleeding and perforation/leak are increased as opposed to diagnostic and screening procedures, and that was discussed with the patient as well.   In addition, I explained that  with the possible need for piecemeal resection, subsequent short-interval endoscopic evaluation for follow up and potential retreatment of the lesion/area may be necessary.  I did offer, a referral to surgery in order for patient to have opportunity to discuss surgical management/intervention prior to finalizing decision for attempt at endoscopic removal, however, the patient deferred on this.  If, after attempt at removal of the polyp/lesion, it is found that the patient has a complication or that an invasive lesion or malignant lesion is found, or that the polyp/lesion continues to recur, the patient is aware and understands that surgery may still be indicated/required.  Based on the patient's age, I would not normally offer an OVESCO full-thickness resection, as I think the utility of this in someone who has a significant life expectancy is such that she would likely benefit from surgery.  Thus, if this cannot be removed endoscopically, I think she should be seen by our colorectal surgeons to consider an ileocecectomy.  She wants to try to minimize surgery if at all possible.  All patient questions were answered, to the best of my ability, and the patient and her sister agree to the aforementioned plan of action with follow-up as indicated.   PLAN  Proceed with scheduling colonoscopy with EMR attempt Laboratories appropriate for preprocedure to be ordered (CBC/BMP/INR) Colorectal surgery referral based on findings or attempt at resection   No orders of the defined types were placed in this encounter.   New Prescriptions   OMEPRAZOLE (PRILOSEC) 20 MG CAPSULE    Take 1 capsule (20 mg total) by mouth daily.   Modified  Medications   No medications on file    Planned Follow Up No follow-ups on file.   Justice Britain, MD [facility] # [phone]

## 2019-05-25 ENCOUNTER — Encounter: Payer: Self-pay | Admitting: Gastroenterology

## 2019-05-25 NOTE — Progress Notes (Signed)
Please see progress note from 12/17 under my chart E visit for full details of patient's clinic visit.

## 2019-05-26 MED ORDER — SUPREP BOWEL PREP KIT 17.5-3.13-1.6 GM/177ML PO SOLN: 1.0000 | ORAL | 0 refills | Status: DC

## 2019-05-26 NOTE — Patient Instructions (Signed)
You have been scheduled for a colonoscopy+emr at hospital. Please follow written instructions given to you at your visit today.  Please pick up your prep supplies at the pharmacy within the next 1-3 days. If you use inhalers (even only as needed), please bring them with you on the day of your procedure.  If you are age 59 or younger, your body mass index should be between 19-25. Your There is no height or weight on file to calculate BMI. If this is out of the aformentioned range listed, please consider follow up with your Primary Care Provider.   Your provider has requested that you go to the basement level for lab work before leaving today. Press "B" on the elevator. The lab is located at the first door on the left as you exit the elevator.  Thank you for choosing me and [facility].  Dr. Rush Landmark

## 2019-05-26 NOTE — Addendum Note (Signed)
Addended byDebbe Mounts on: 05/26/2019 03:09 PM   Modules accepted: Orders

## 2019-05-26 NOTE — Addendum Note (Signed)
Addended by: Debbe Mounts on: 05/26/2019 10:49 AM   Modules accepted: Orders, SmartSet

## 2019-05-29 ENCOUNTER — Telehealth: Payer: Self-pay | Admitting: Gastroenterology

## 2019-05-29 ENCOUNTER — Telehealth: Payer: Self-pay | Admitting: Adult Health

## 2019-05-29 DIAGNOSIS — Z860100 Personal history of colon polyps, unspecified: Secondary | ICD-10-CM

## 2019-05-29 DIAGNOSIS — R109 Unspecified abdominal pain: Secondary | ICD-10-CM

## 2019-05-29 DIAGNOSIS — K635 Polyp of colon: Secondary | ICD-10-CM

## 2019-05-29 NOTE — Telephone Encounter (Signed)
I called pt and she will be having some procedure done later in week and will be having covid test on 06-12-19. And will need to be quarantined for 4 days prior to procedure.  Made her appt on 06-13-19 mychart VV.  She appreciated this.

## 2019-05-29 NOTE — Telephone Encounter (Signed)
Pt states that Dr. Rush Landmark mentioned that he wanted pt to have blood work before his procedure at the hospital on 06/15/19. She wants to know if he still wants her to have bloodwork. Pls call her.

## 2019-05-29 NOTE — Telephone Encounter (Signed)
Pt is asking for a call from [facility] to discuss if the upcoming appointment needs to be cancelled, please call

## 2019-05-29 NOTE — Telephone Encounter (Signed)
Pt informed. Pt will come for labs next week. Labs in epic.

## 2019-05-29 NOTE — Telephone Encounter (Signed)
Thank you for the update. I did this Virtual Visit after hours. Please proceed with a CBC/BMP/INR when able. Thank you. GM

## 2019-05-29 NOTE — Telephone Encounter (Signed)
See note below and advise. 

## 2019-06-06 ENCOUNTER — Ambulatory Visit: Payer: Medicare HMO | Admitting: Internal Medicine

## 2019-06-07 ENCOUNTER — Other Ambulatory Visit (INDEPENDENT_AMBULATORY_CARE_PROVIDER_SITE_OTHER): Payer: Medicare HMO

## 2019-06-07 DIAGNOSIS — Z86010 Personal history of colonic polyps: Secondary | ICD-10-CM | POA: Diagnosis not present

## 2019-06-07 DIAGNOSIS — K635 Polyp of colon: Secondary | ICD-10-CM

## 2019-06-07 LAB — CBC
HCT: 42.6 % (ref 36.0–46.0)
Hemoglobin: 14.5 g/dL (ref 12.0–15.0)
MCHC: 34.1 g/dL (ref 30.0–36.0)
MCV: 91.5 fl (ref 78.0–100.0)
Platelets: 274 10*3/uL (ref 150.0–400.0)
RBC: 4.65 Mil/uL (ref 3.87–5.11)
RDW: 14.1 % (ref 11.5–15.5)
WBC: 9.1 10*3/uL (ref 4.0–10.5)

## 2019-06-07 LAB — BASIC METABOLIC PANEL
BUN: 9 mg/dL (ref 6–23)
CO2: 25 mEq/L (ref 19–32)
Calcium: 9.2 mg/dL (ref 8.4–10.5)
Chloride: 106 mEq/L (ref 96–112)
Creatinine, Ser: 0.93 mg/dL (ref 0.40–1.20)
GFR: 61.52 mL/min (ref >60.00)
Glucose, Bld: 125 mg/dL — ABNORMAL HIGH (ref 70–99)
Potassium: 3.5 mEq/L (ref 3.5–5.1)
Sodium: 139 mEq/L (ref 135–145)

## 2019-06-07 LAB — PROTIME-INR
INR: 1 ratio (ref 0.8–1.0)
Prothrombin Time: 11.6 s (ref 9.6–13.1)

## 2019-06-12 ENCOUNTER — Other Ambulatory Visit (HOSPITAL_COMMUNITY)
Admission: RE | Admit: 2019-06-12 | Discharge: 2019-06-12 | Disposition: A | Discharge status: Home / Self Care | Payer: Medicare HMO | Source: Ambulatory Visit | Attending: Gastroenterology | Admitting: Gastroenterology

## 2019-06-12 DIAGNOSIS — Z20822 Contact with and (suspected) exposure to covid-19: Secondary | ICD-10-CM | POA: Diagnosis not present

## 2019-06-12 DIAGNOSIS — Z01812 Encounter for preprocedural laboratory examination: Secondary | ICD-10-CM | POA: Diagnosis not present

## 2019-06-13 ENCOUNTER — Telehealth: Payer: Medicare HMO | Admitting: Adult Health

## 2019-06-13 LAB — NOVEL CORONAVIRUS, NAA (HOSP ORDER, SEND-OUT TO REF LAB; TAT 18-24 HRS): SARS-CoV-2, NAA: NOT DETECTED

## 2019-06-14 ENCOUNTER — Encounter (HOSPITAL_COMMUNITY): Payer: Self-pay | Admitting: Gastroenterology

## 2019-06-14 ENCOUNTER — Other Ambulatory Visit: Payer: Self-pay

## 2019-06-14 NOTE — Progress Notes (Signed)
Anesthesia Chart Review: SAME DAY WORK-UP (ENDO)    Case: [id number] Date/Time: 06/15/19 1130   Procedures:      COLONOSCOPY WITH PROPOFOL (N/A )     ENDOSCOPIC MUCOSAL RESECTION (N/A )   Anesthesia type: Monitor Anesthesia Care   Pre-op diagnosis: Cecal Polyp, Hx of Adenoma polyp   Location: [facility] ROOM 1 / [facility]   Surgeons: Mansouraty, Telford Nab., MD      DISCUSSION: Patient is a 59 year old female scheduled for the above procedure. Previous colonoscopy 04/04/19 with resection of 3 mm rectal polyp but unable to resect 12 mm polyp at the appendiceal orifice and referred to Dr. Rush Landmark.   History includes smoking, LBBB, complete heart block/Stokes Adams syncope (s/p Medtronic Advisa DR MRI dual chamber pacemaker 12/13/14), GERD, hiatal hernia, OSA (CPAP), insomnia, diverticulitis, panic disorder, seizures.   06/12/19  COVID-19 tet negative.  Anesthesia team to evaluate on the day of her procedure.   VS: Ht _0  (1.549 m)   Wt 79.4 kg   BMI 33.07 kg/m     PROVIDERS: Rankins, Bill Salinas, MD is PCP  - Neurologist is with [facility]. Last evaluated by Ward Givens, NP on 11/16/18. Virl Axe, MD is EP cardiologist. Last evaluation on 07/07/18 with Chanetta Marshall, NP. Normal PPM function. One year follow-up recommended.  Harl Bowie, MD is GI    LABS: Lab results as of 06/07/19 include: Lab Results  Component Value Date   WBC 9.1 06/07/2019   HGB 14.5 06/07/2019   HCT 42.6 06/07/2019   PLT 274.0 06/07/2019   GLUCOSE 125 (H) 06/07/2019   ALT 10 12/07/2018   AST 10 12/07/2018   NA 139 06/07/2019   K 3.5 06/07/2019   CL 106 06/07/2019   CREATININE 0.93 06/07/2019   BUN 9 06/07/2019   CO2 25 06/07/2019   INR 1.0 06/07/2019    EKG: 06/11/17:  NSR LAD Anterior infarct, age undetermined   CV: Echo 12/14/14: Study Conclusions - Left ventricle: The cavity size was normal. Wall thickness was   increased in a pattern of mild LVH. Systolic  function was normal.   The estimated ejection fraction was in the range of 55% to 60%.   Wall motion was normal; there were no regional wall motion   abnormalities. Doppler parameters are consistent with abnormal   left ventricular relaxation (grade 1 diastolic dysfunction). - Pericardium, extracardiac: A trivial pericardial effusion was   identified. Features were not consistent with tamponade   physiology.   Nuclear stress test 10/23/14: Study Highlights Low risk pharmacological stress nuclear study with LBBB-related fixed perfusion artifact, otherwise normal perfusion and normal left ventricular regional and global systolic function.   Past Medical History:  Diagnosis Date  . Chronic insomnia 03/07/2015  . Diverticulitis   . GERD (gastroesophageal reflux disease)   . Hiatal hernia   . LBBB (left bundle branch block)   . OSA on CPAP   . Pacemaker   . Panic disorder 09/07/2014  . Seizures ([place])    last Seizure 1 1/2 month ago= (about August 2020)  . Sleep apnea    does not use a CPAP  . Wrist tendonitis    R     Past Surgical History:  Procedure Laterality Date  . COLONOSCOPY  last 03/31/2016  . COLONOSCOPY WITH PROPOFOL N/A 04/04/2019   Procedure: COLONOSCOPY WITH PROPOFOL;  Surgeon: Mauri Pole, MD;  [facility]: [facility];  Service: Endoscopy;  Laterality: N/A;  . EP IMPLANTABLE DEVICE  N/A 12/13/2014   Procedure: Pacemaker Implant;  Surgeon: Deboraha Sprang, MD;  Location: [place] [facility];  Service: Cardiovascular;  Laterality: N/A;  . HAND SURGERY    . POLYPECTOMY    . POLYPECTOMY  04/04/2019   Procedure: POLYPECTOMY;  Surgeon: Mauri Pole, MD;  [facility]: [facility];  Service: Endoscopy;;  . TUBAL LIGATION      MEDICATIONS: No current facility-administered medications for this encounter.   Marland Kitchen acetaminophen (TYLENOL) 500 MG tablet  . Cholecalciferol (VITAMIN D3) 2000 units capsule  . lamoTRIgine (LAMICTAL) 200 MG tablet  . lamoTRIgine (LAMICTAL)  25 MG tablet  . Nutritional Supplements (ESTROVEN PO)  . omeprazole (PRILOSEC) 20 MG capsule  . Oxcarbazepine (TRILEPTAL) 300 MG tablet  . Na Sulfate-K Sulfate-Mg Sulf (SUPREP BOWEL PREP KIT) 17.5-3.13-1.6 GM/177ML SOLN    Myra Gianotti, PA-C Surgical Short Stay/Anesthesiology [facility] Phone [phone] [facility] Phone [phone] 06/14/2019 3:45 PM

## 2019-06-14 NOTE — Progress Notes (Signed)
Patient denies shortness of breath, fever, cough and chest pain.  PCP - Dr Milagros Evener Cardiologist - Dr Janora Norlander - [facility] Neuro - Ward Givens, NP  Chest x-ray - denies EKG - 06/11/17  Stress Test - 10/23/14 ECHO - 12/14/14 Cardiac Cath - denies  ICD Pacemaker - Medtronic model #A2DR01,  Perioperative Prescription for ICD Device Programming faxed to [facility] 06/14/19 at 1516.  E-mail sent to  Tomi Bamberger and Erlene Quan to advise them of this procedure on 06/14/19 at 1515.  Last remote device check was on 04/06/19.   Sleep Study - Yes CPAP - Does not use CPAP.  Anesthesia review: Yes  STOP now taking any Aspirin (unless otherwise instructed by your surgeon), Aleve, Naproxen, Ibuprofen, Motrin, Advil, Goody's, BC's, all herbal medications, fish oil, and all vitamins.   Coronavirus Screening Covid test 06/12/19 was negative.  Patient verbalized understanding of instructions that were given via phone.

## 2019-06-14 NOTE — Anesthesia Preprocedure Evaluation (Addendum)
Anesthesia Evaluation  Patient identified by MRN, date of birth, ID band Patient awake    Reviewed: Allergy & Precautions, NPO status , Patient's Chart, lab work & pertinent test results  History of Anesthesia Complications Negative for: history of anesthetic complications  Airway Mallampati: II  TM Distance: >3 FB Neck ROM: Full    Dental  (+) Teeth Intact   Pulmonary sleep apnea , Current Smoker and Patient abstained from smoking.,    Pulmonary exam normal        Cardiovascular Normal cardiovascular exam+ dysrhythmias + pacemaker    '16 TTE - Mild LVH. EF 55% to 60%. Grade 1 diastolic dysfunction. A trivial pericardial effusion was identified.    Neuro/Psych Seizures -,  PSYCHIATRIC DISORDERS Anxiety    GI/Hepatic Neg liver ROS, hiatal hernia, GERD  Medicated and Controlled,  Endo/Other   Obesity   Renal/GU negative Renal ROS     Musculoskeletal  (+) Arthritis ,  Gout    Abdominal   Peds  Hematology negative hematology ROS (+)   Anesthesia Other Findings Covid neg 1/4   Reproductive/Obstetrics                            Anesthesia Physical Anesthesia Plan  ASA: III  Anesthesia Plan: MAC   Post-op Pain Management:    Induction: Intravenous  PONV Risk Score and Plan: 2 and Propofol infusion and Treatment may vary due to age or medical condition  Airway Management Planned: Nasal Cannula and Natural Airway  Additional Equipment: None  Intra-op Plan:   Post-operative Plan:   Informed Consent: I have reviewed the patients History and Physical, chart, labs and discussed the procedure including the risks, benefits and alternatives for the proposed anesthesia with the patient or authorized representative who has indicated his/her understanding and acceptance.       Plan Discussed with: CRNA and Anesthesiologist  Anesthesia Plan Comments: ( )      Anesthesia Quick  Evaluation

## 2019-06-15 ENCOUNTER — Ambulatory Visit (HOSPITAL_COMMUNITY): Payer: Medicare HMO | Admitting: Vascular Surgery

## 2019-06-15 ENCOUNTER — Ambulatory Visit (HOSPITAL_COMMUNITY)
Admission: RE | Admit: 2019-06-15 | Discharge: 2019-06-15 | Disposition: A | Discharge status: Home / Self Care | Payer: Medicare HMO | Attending: Gastroenterology | Admitting: Gastroenterology

## 2019-06-15 ENCOUNTER — Encounter (HOSPITAL_COMMUNITY)
Admission: RE | Disposition: A | Discharge status: Home / Self Care | Payer: Self-pay | Source: Home / Self Care | Attending: Gastroenterology

## 2019-06-15 ENCOUNTER — Encounter (HOSPITAL_COMMUNITY): Payer: Self-pay | Admitting: Gastroenterology

## 2019-06-15 DIAGNOSIS — I447 Left bundle-branch block, unspecified: Secondary | ICD-10-CM | POA: Insufficient documentation

## 2019-06-15 DIAGNOSIS — K219 Gastro-esophageal reflux disease without esophagitis: Secondary | ICD-10-CM | POA: Diagnosis not present

## 2019-06-15 DIAGNOSIS — F419 Anxiety disorder, unspecified: Secondary | ICD-10-CM | POA: Diagnosis not present

## 2019-06-15 DIAGNOSIS — F17210 Nicotine dependence, cigarettes, uncomplicated: Secondary | ICD-10-CM | POA: Diagnosis not present

## 2019-06-15 DIAGNOSIS — D120 Benign neoplasm of cecum: Secondary | ICD-10-CM | POA: Diagnosis not present

## 2019-06-15 DIAGNOSIS — K635 Polyp of colon: Secondary | ICD-10-CM | POA: Diagnosis not present

## 2019-06-15 DIAGNOSIS — Z885 Allergy status to narcotic agent status: Secondary | ICD-10-CM | POA: Diagnosis not present

## 2019-06-15 DIAGNOSIS — G4733 Obstructive sleep apnea (adult) (pediatric): Secondary | ICD-10-CM | POA: Insufficient documentation

## 2019-06-15 DIAGNOSIS — Z886 Allergy status to analgesic agent status: Secondary | ICD-10-CM | POA: Diagnosis not present

## 2019-06-15 DIAGNOSIS — Z8371 Family history of colonic polyps: Secondary | ICD-10-CM | POA: Diagnosis not present

## 2019-06-15 DIAGNOSIS — R569 Unspecified convulsions: Secondary | ICD-10-CM | POA: Diagnosis not present

## 2019-06-15 DIAGNOSIS — M1990 Unspecified osteoarthritis, unspecified site: Secondary | ICD-10-CM | POA: Insufficient documentation

## 2019-06-15 DIAGNOSIS — Z880 Allergy status to penicillin: Secondary | ICD-10-CM | POA: Diagnosis not present

## 2019-06-15 DIAGNOSIS — K641 Second degree hemorrhoids: Secondary | ICD-10-CM | POA: Diagnosis not present

## 2019-06-15 DIAGNOSIS — I442 Atrioventricular block, complete: Secondary | ICD-10-CM | POA: Diagnosis not present

## 2019-06-15 DIAGNOSIS — Z6833 Body mass index (BMI) 33.0-33.9, adult: Secondary | ICD-10-CM | POA: Insufficient documentation

## 2019-06-15 DIAGNOSIS — E669 Obesity, unspecified: Secondary | ICD-10-CM | POA: Diagnosis not present

## 2019-06-15 DIAGNOSIS — K621 Rectal polyp: Secondary | ICD-10-CM | POA: Diagnosis not present

## 2019-06-15 DIAGNOSIS — Z950 Presence of cardiac pacemaker: Secondary | ICD-10-CM | POA: Insufficient documentation

## 2019-06-15 DIAGNOSIS — Z881 Allergy status to other antibiotic agents status: Secondary | ICD-10-CM | POA: Insufficient documentation

## 2019-06-15 DIAGNOSIS — Z86010 Personal history of colonic polyps: Secondary | ICD-10-CM

## 2019-06-15 HISTORY — DX: Pneumonia, unspecified organism: J18.9

## 2019-06-15 HISTORY — DX: Unspecified osteoarthritis, unspecified site: M19.90

## 2019-06-15 HISTORY — PX: HEMOSTASIS CLIP PLACEMENT: SHX6857

## 2019-06-15 HISTORY — PX: COLONOSCOPY WITH PROPOFOL: SHX5780

## 2019-06-15 HISTORY — DX: Gout, unspecified: M10.9

## 2019-06-15 HISTORY — PX: ENDOSCOPIC MUCOSAL RESECTION: SHX6839

## 2019-06-15 HISTORY — PX: SUBMUCOSAL LIFTING INJECTION: SHX6855

## 2019-06-15 HISTORY — PX: POLYPECTOMY: SHX5525

## 2019-06-15 SURGERY — COLONOSCOPY WITH PROPOFOL
Anesthesia: Monitor Anesthesia Care

## 2019-06-15 MED ORDER — LACTATED RINGERS IV SOLN: INTRAVENOUS | Status: DC

## 2019-06-15 MED ORDER — MIDAZOLAM HCL 5 MG/5ML IJ SOLN
INTRAMUSCULAR | Status: DC | PRN
  Administered 2019-06-15: 2 mg via INTRAVENOUS

## 2019-06-15 MED ORDER — PROPOFOL 500 MG/50ML IV EMUL
INTRAVENOUS | Status: DC | PRN
  Administered 2019-06-15: 125 ug/kg/min via INTRAVENOUS

## 2019-06-15 MED ORDER — SODIUM CHLORIDE 0.9 % IV SOLN: INTRAVENOUS | Status: DC

## 2019-06-15 MED ORDER — PROPOFOL 10 MG/ML IV BOLUS
INTRAVENOUS | Status: DC | PRN
  Administered 2019-06-15 (×3): 10 mg via INTRAVENOUS
  Administered 2019-06-15: 20 mg via INTRAVENOUS

## 2019-06-15 SURGICAL SUPPLY — 21 items

## 2019-06-15 NOTE — Discharge Instructions (Signed)
Please minimize use of Non-steroidal medications for the next 1-2 weeks to decrease risk of bleeding post resection.

## 2019-06-15 NOTE — H&P (Signed)
GASTROENTEROLOGY PROCEDURE H&P NOTE   Primary Care Physician: Aretta Nip, MD  HPI: [name] [name] is a 59 y.o. female who presents for Colonoscopy with EMR attempt.  Past Medical History:  Diagnosis Date  . Arthritis   . Chronic insomnia 03/07/2015  . Diverticulitis   . GERD (gastroesophageal reflux disease)   . Gout   . Hiatal hernia   . LBBB (left bundle branch block)   . Pacemaker    Medtronic   . Panic disorder 09/07/2014  . Pneumonia    x 1 - Walking  . Seizures ([place])    Last Seizure in 2017 - controlled with meds   . Sleep apnea    does not use a CPAP  . Wrist tendonitis    R    Past Surgical History:  Procedure Laterality Date  . COLONOSCOPY  last 03/31/2016  . COLONOSCOPY WITH PROPOFOL N/A 04/04/2019   Procedure: COLONOSCOPY WITH PROPOFOL;  Surgeon: Mauri Pole, MD;  Location: [facility];  Service: Endoscopy;  Laterality: N/A;  . EP IMPLANTABLE DEVICE N/A 12/13/2014   Procedure: Pacemaker Implant;  Surgeon: Deboraha Sprang, MD;  Location: [place] [facility];  Service: Cardiovascular;  Laterality: N/A;  . HAND SURGERY    . POLYPECTOMY    . POLYPECTOMY  04/04/2019   Procedure: POLYPECTOMY;  Surgeon: Mauri Pole, MD;  Location: [facility];  Service: Endoscopy;;  . TUBAL LIGATION    . UPPER GI ENDOSCOPY    . WISDOM TOOTH EXTRACTION     Current Facility-Administered Medications  Medication Dose Route Frequency Provider Last Rate Last Admin  . lactated ringers infusion   Intravenous Continuous Mansouraty, Telford Nab., MD       Allergies  Allergen Reactions  . Zonisamide Anxiety    Panic Attacks  . Aspirin     sensitivity  . Ciprofloxacin     Seizures & it keeps her awake  . Codeine Hives  . Hydrocodone Hives  . Oxycodone Hives  . Penicillins Hives and Swelling    Did it involve swelling of the face/tongue/throat, SOB, or low BP? Yes Did it involve sudden or severe rash/hives, skin peeling, or any reaction on the inside of  your mouth or nose? Yes Did you need to seek medical attention at a hospital or doctor's office? No When did it last happen?1990's If all above answers are "NO", may proceed with cephalosporin use.    Family History  Problem Relation Age of Onset  . Alzheimer's disease Mother   . Hypertension Mother   . Colon polyps Mother   . Hypertension Father   . Stroke Father   . Leukemia Father   . Prostate cancer Father   . Colon polyps Sister   . Stroke Brother   . Hypertension Brother   . Sleep apnea Brother   . Colon polyps Brother   . Kidney failure Sister   . Diabetes Sister   . Colon polyps Sister   . Coronary artery disease Maternal Grandfather   . Parkinson's disease Maternal Grandmother   . Colon cancer Paternal Aunt   . Esophageal cancer Neg Hx   . Stomach cancer Neg Hx   . Rectal cancer Neg Hx   . Inflammatory bowel disease Neg Hx   . Liver disease Neg Hx   . Pancreatic cancer Neg Hx    Social History   Socioeconomic History  . Marital status: Single    Spouse name: Not on file  . Number of children:  0  . Years of education: 16  . Highest education level: Not on file  Occupational History  . Not on file  Tobacco Use  . Smoking status: Current Every Day Smoker    Packs/day: 0.50    Years: 45.00    Pack years: 22.50    Types: Cigarettes  . Smokeless tobacco: Never Used  Substance and Sexual Activity  . Alcohol use: No    Alcohol/week: 0.0 standard drinks    Comment: Quit 12/06/2009  . Drug use: No  . Sexual activity: Not on file  Other Topics Concern  . Not on file  Social History Narrative   Patient drinks 24 oz of soda daily.   Lives with sister   Some college   Left handed   Social Determinants of Health   Financial Resource Strain:   . Difficulty of Paying Living Expenses: Not on file  Food Insecurity:   . Worried About Charity fundraiser in the Last Year: Not on file  . Ran Out of Food in the Last Year: Not on file  Transportation Needs:    . Lack of Transportation (Medical): Not on file  . Lack of Transportation (Non-Medical): Not on file  Physical Activity:   . Days of Exercise per Week: Not on file  . Minutes of Exercise per Session: Not on file  Stress:   . Feeling of Stress : Not on file  Social Connections:   . Frequency of Communication with Friends and Family: Not on file  . Frequency of Social Gatherings with Friends and Family: Not on file  . Attends Religious Services: Not on file  . Active Member of Clubs or Organizations: Not on file  . Attends Archivist Meetings: Not on file  . Marital Status: Not on file  Intimate Partner Violence:   . Fear of Current or Ex-Partner: Not on file  . Emotionally Abused: Not on file  . Physically Abused: Not on file  . Sexually Abused: Not on file    Physical Exam: Vital signs in last 24 hours: Weight:  [79.4 kg] 79.4 kg (01/06 1531)   GEN: NAD EYE: Sclerae anicteric ENT: MMM CV: Non-tachycardic GI: Soft, NT/ND NEURO:  Alert & Oriented x 3  Lab Results: No results for input(s): WBC, HGB, HCT, PLT in the last 72 hours. BMET No results for input(s): NA, K, CL, CO2, GLUCOSE, BUN, CREATININE, CALCIUM in the last 72 hours. LFT No results for input(s): PROT, ALBUMIN, AST, ALT, ALKPHOS, BILITOT, BILIDIR, IBILI in the last 72 hours. PT/INR No results for input(s): LABPROT, INR in the last 72 hours.   Impression / Plan: This is a 59 y.o.female who presents for Colonoscopy with EMR attempt.  The risks and benefits of endoscopic evaluation were discussed with the patient; these include but are not limited to the risk of perforation, infection, bleeding, missed lesions, lack of diagnosis, severe illness requiring hospitalization, as well as anesthesia and sedation related illnesses.  The patient is agreeable to proceed.    Justice Britain, MD [facility]oscopy Office # [phone]

## 2019-06-15 NOTE — Op Note (Signed)
[facility] Patient Name: [name] [name] Procedure Date : 06/15/2019 MRN: [id number] Attending MD: Justice Britain , MD Date of Birth: [birth year] CSN: [id number] Age: 59 Admit Type: Inpatient Procedure:                Colonoscopy Indications:              Excision of colonic polyp extending to the                            appendiceal orifice Providers:                Justice Britain, MD, Burtis Junes, RN, Lazaro Arms,                            Technician Referring MD:             Mauri Pole, MD, [place] Rankins Medicines:                Monitored Anesthesia Care Complications:            No immediate complications. Estimated Blood Loss:     Estimated blood loss was minimal. Procedure:                Pre-Anesthesia Assessment:                           - Prior to the procedure, a History and Physical                            was performed, and patient medications and                            allergies were reviewed. The patient's tolerance of                            previous anesthesia was also reviewed. The risks                            and benefits of the procedure and the sedation                            options and risks were discussed with the patient.                            All questions were answered, and informed consent                            was obtained. Prior Anticoagulants: The patient has                            taken no previous anticoagulant or antiplatelet                            agents. ASA Grade Assessment: II - A patient with  mild systemic disease. After reviewing the risks                            and benefits, the patient was deemed in                            satisfactory condition to undergo the procedure.                           After obtaining informed consent, the colonoscope                            was passed under direct vision. Throughout the           procedure, the patient's blood pressure, pulse, and                            oxygen saturations were monitored continuously. The                            CF-HQ190L ([id number]) Olympus colonoscope was                            introduced through the anus and advanced to the 5                            cm into the ileum. The colonoscopy was performed                            without difficulty. The patient tolerated the                            procedure. The quality of the bowel preparation was                            good. The terminal ileum, ileocecal valve,                            appendiceal orifice, and rectum were photographed. Scope In: 11:01:15 AM Scope Out: 12:12:08 PM Scope Withdrawal Time: 1 hour 7 minutes 10 seconds  Total Procedure Duration: 1 hour 10 minutes 53 seconds  Findings:      The digital rectal exam findings include hemorrhoids. Pertinent       negatives include no palpable rectal lesions.      The terminal ileum and ileocecal valve appeared normal.      A 20 mm polyp was found in the cecum base extending to the near os of       the appendix. The polyp was sessile and granular. Seeing that the Os was       not necessarily involved at this time, I felt that attempt at resection       was worth it and her likelihood of developing appendicitis would be       lower (we had previously discussed not doing a FTRD). Preparations were       made for mucosal resection. Orise gel was injected  to raise the lesion.       Piecemeal mucosal resection using a snare was performed. Resection and       retrieval were complete. To prevent bleeding after mucosal resection,       four hemostatic clips were successfully placed (MR conditional) - I       could not close the area completely due to the indentation of the       appendix orifice. There was no bleeding at the end of the procedure.      Many sessile polyps were found in the rectum, recto-sigmoid colon and        sigmoid colon - hyperplastic appearing. The polyps were 1 to 5 mm in       size. Five of these polyps were removed with a cold snare to rule out       adenomatous tissue. Resection and retrieval were complete.      Non-bleeding non-thrombosed internal hemorrhoids were found during       retroflexion, during perianal exam and during digital exam. The       hemorrhoids were Grade II (internal hemorrhoids that prolapse but reduce       spontaneously). Impression:               - Hemorrhoids found on digital rectal exam.                           - The examined portion of the ileum was normal.                           - One 20 mm polyp in the cecum, removed with                            mucosal resection. Resected and retrieved. Clips                            (MR conditional) were placed.                           - Many 1 to 5 mm polyps in the rectum, at the                            recto-sigmoid colon and in the sigmoid colon,                            removed with a cold snare. Resected and retrieved.                           - Non-bleeding non-thrombosed internal hemorrhoids. Recommendation:           - The patient will be observed post-procedure,                            until all discharge criteria are met.                           - Discharge patient to home.                           -  Patient has a contact number available for                            emergencies. The signs and symptoms of potential                            delayed complications were discussed with the                            patient. Return to normal activities tomorrow.                            Written discharge instructions were provided to the                            patient.                           - Monitor for signs/symptoms of bleeding,                            perforation, and infection. If issues please call                            our number to get further assistance as  needed.                           - Await pathology results.                           - Repeat colonoscopy in 6 months for surveillance                            after piecemeal resection. Hopefully we can stave                            off need for an ileocectomy.                           - The findings and recommendations were discussed                            with the patient.                           - The findings and recommendations were discussed                            with the patient's family. Procedure Code(s):        --- Professional ---                           [id number], Colonoscopy, flexible; with endoscopic                            mucosal resection  Diagnosis Code(s):        --- Professional ---                           K64.1, Second degree hemorrhoids                           K63.5, Polyp of colon CPT copyright 2019 American Medical Association. All rights reserved. The codes documented in this report are preliminary and upon coder review may  be revised to meet current compliance requirements. Justice Britain, MD 06/15/2019 12:53:24 PM Number of Addenda: 0

## 2019-06-15 NOTE — Anesthesia Procedure Notes (Signed)
Procedure Name: MAC Date/Time: 06/15/2019 11:04 AM Performed by: Lieutenant Diego, CRNA Pre-anesthesia Checklist: Patient identified, Emergency Drugs available, Suction available, Patient being monitored and Timeout performed Patient Re-evaluated:Patient Re-evaluated prior to induction Oxygen Delivery Method: Simple face mask

## 2019-06-15 NOTE — Anesthesia Postprocedure Evaluation (Signed)
Anesthesia Post Note  Patient: [name] [name]  Procedure(s) Performed: COLONOSCOPY WITH PROPOFOL (N/A ) ENDOSCOPIC MUCOSAL RESECTION (N/A ) HEMOSTASIS CLIP PLACEMENT SUBMUCOSAL LIFTING INJECTION POLYPECTOMY     Patient location during evaluation: PACU Anesthesia Type: MAC Level of consciousness: awake and alert Pain management: pain level controlled Vital Signs Assessment: post-procedure vital signs reviewed and stable Respiratory status: spontaneous breathing, nonlabored ventilation and respiratory function stable Cardiovascular status: stable and blood pressure returned to baseline Anesthetic complications: no    Last Vitals:  Vitals:   06/15/19 1304 06/15/19 1311  BP:    Pulse: 74   Resp:  20  Temp:  (!) 36.1 C  SpO2: 98%     Last Pain:  Vitals:   06/15/19 1311  TempSrc:   PainSc: 0-No pain                 Audry Pili

## 2019-06-15 NOTE — Transfer of Care (Signed)
Immediate Anesthesia Transfer of Care Note  Patient: [name] [name]  Procedure(s) Performed: COLONOSCOPY WITH PROPOFOL (N/A ) ENDOSCOPIC MUCOSAL RESECTION (N/A ) HEMOSTASIS CLIP PLACEMENT SUBMUCOSAL LIFTING INJECTION POLYPECTOMY  Patient Location: PACU  Anesthesia Type:MAC  Level of Consciousness: drowsy  Airway & Oxygen Therapy: Patient Spontanous Breathing and Patient connected to face mask oxygen  Post-op Assessment: Report given to RN and Post -op Vital signs reviewed and stable  Post vital signs: Reviewed and stable  Last Vitals:  Vitals Value Taken Time  BP 129/58 06/15/19 1220  Temp    Pulse 76 06/15/19 1221  Resp 18 06/15/19 1221  SpO2 100 % 06/15/19 1221  Vitals shown include unvalidated device data.  Last Pain:  Vitals:   06/15/19 1035  TempSrc: Oral  PainSc: 0-No pain         Complications: No apparent anesthesia complications

## 2019-06-16 ENCOUNTER — Encounter: Payer: Self-pay | Admitting: Gastroenterology

## 2019-06-16 LAB — SURGICAL PATHOLOGY

## 2019-07-05 ENCOUNTER — Other Ambulatory Visit: Payer: Self-pay

## 2019-07-05 ENCOUNTER — Ambulatory Visit: Payer: Medicare HMO | Admitting: Internal Medicine

## 2019-07-05 ENCOUNTER — Encounter: Payer: Self-pay | Admitting: Internal Medicine

## 2019-07-05 VITALS — BP 154/90 | HR 77 | Ht 62.0 in | Wt 176.4 lb

## 2019-07-05 DIAGNOSIS — I442 Atrioventricular block, complete: Secondary | ICD-10-CM

## 2019-07-05 DIAGNOSIS — Z959 Presence of cardiac and vascular implant and graft, unspecified: Secondary | ICD-10-CM

## 2019-07-05 NOTE — Patient Instructions (Signed)
Medication Instructions:  Your physician recommends that you continue on your current medications as directed. Please refer to the Current Medication list given to you today.  Labwork: None ordered.  Testing/Procedures: None ordered.  Follow-Up: Your physician wants you to follow-up in: one year with Rebecca Eaton.  You will receive a reminder letter in the mail two months in advance. If you don't receive a letter, please call our office to schedule the follow-up appointment.  Remote monitoring is used to monitor your Pacemaker of ICD from home. This monitoring reduces the number of office visits required to check your device to one time per year. It allows [place] to keep an eye on the functioning of your device to ensure it is working properly.   Any Other Special Instructions Will Be Listed Below (If Applicable).  If you need a refill on your cardiac medications before your next appointment, please call your pharmacy.

## 2019-07-05 NOTE — Progress Notes (Signed)
.      Patient Care Team: Rankins, Bill Salinas, MD as PCP - General (Family Medicine)   HPI  [name] [name] is a 59 y.o. female Seen in follow-up for pacemaker implanted for intermittent complete heart block, a history of "seizures" associated with documented bradycardia and complete heart block in  the setting of underlying left bundle branch block.  Discussions with neurology thereafter prompted the discontinuation of her antiepileptics; however, further discussions with her primary neurologist redirected that plan the thought that drugs would be weaned over 6 months. The decision has been made to continue the meds   The patient denies chest pain, shortness of breath, nocturnal dyspnea, or orthopnea; some peripheral edema  There have been no palpitation, lightheadedness  or syncope.   Diet has become increasingly exuberant of sodium intake.  Somebody told her to do this.??     Past Medical History:  Diagnosis Date  . Arthritis   . Chronic insomnia 03/07/2015  . Diverticulitis   . GERD (gastroesophageal reflux disease)   . Gout   . Hiatal hernia   . LBBB (left bundle branch block)   . Pacemaker    Medtronic   . Panic disorder 09/07/2014  . Pneumonia    x 1 - Walking  . Seizures ([place])    Last Seizure in 2017 - controlled with meds   . Sleep apnea    does not use a CPAP  . Wrist tendonitis    R     Past Surgical History:  Procedure Laterality Date  . COLONOSCOPY  last 03/31/2016  . COLONOSCOPY WITH PROPOFOL N/A 04/04/2019   Procedure: COLONOSCOPY WITH PROPOFOL;  Surgeon: Mauri Pole, MD;  [facility];  Service: Endoscopy;  Laterality: N/A;  . COLONOSCOPY WITH PROPOFOL N/A 06/15/2019   Procedure: COLONOSCOPY WITH PROPOFOL;  Surgeon: Rush Landmark Telford Nab., MD;  Location: [place];  Service: Gastroenterology;  Laterality: N/A;  . ENDOSCOPIC MUCOSAL RESECTION N/A 06/15/2019   Procedure: ENDOSCOPIC MUCOSAL RESECTION;  Surgeon: Rush Landmark Telford Nab., MD;  Location: [place];  Service: Gastroenterology;  Laterality: N/A;  . EP IMPLANTABLE DEVICE N/A 12/13/2014   Procedure: Pacemaker Implant;  Surgeon: Deboraha Sprang, MD;  Location: [place] [facility];  Service: Cardiovascular;  Laterality: N/A;  . HAND SURGERY    . HEMOSTASIS CLIP PLACEMENT  06/15/2019   Procedure: HEMOSTASIS CLIP PLACEMENT;  Surgeon: Irving Copas., MD;  Location: [place];  Service: Gastroenterology;;  . POLYPECTOMY    . POLYPECTOMY  04/04/2019   Procedure: POLYPECTOMY;  Surgeon: Mauri Pole, MD;  [facility];  Service: Endoscopy;;  . POLYPECTOMY  06/15/2019   Procedure: POLYPECTOMY;  Surgeon: Irving Copas., MD;  Location: [place];  Service: Gastroenterology;;  . Lia Foyer LIFTING INJECTION  06/15/2019   Procedure: SUBMUCOSAL LIFTING INJECTION;  Surgeon: Irving Copas., MD;  Location: [place];  Service: Gastroenterology;;  . TUBAL LIGATION    . UPPER GI ENDOSCOPY    . WISDOM TOOTH EXTRACTION      Current Outpatient Medications  Medication Sig Dispense Refill  . acetaminophen (TYLENOL) 500 MG tablet Take 500-1,000 mg by mouth every 6 (six) hours as needed (pain).    . Cholecalciferol (VITAMIN D3) 2000 units capsule Take 1 capsule (2,000 Units total) by mouth daily. 90 capsule 0  . lamoTRIgine (LAMICTAL) 200 MG tablet Take 1 tablet (200 mg total) by mouth 2 (two) times daily. 180 tablet 3  . lamoTRIgine (LAMICTAL) 25 MG tablet TAKE 2 TABLETS (  50 MG TOTAL) BY MOUTH DAILY (CANCEL 50MG  TABLET RX) (Patient taking differently: Take 50 mg by mouth at bedtime. ) 180 tablet 2  . Nutritional Supplements (ESTROVEN PO) Take 1 tablet by mouth at bedtime.     Marland Kitchen omeprazole (PRILOSEC) 20 MG capsule Take 1 capsule (20 mg total) by mouth daily. 90 capsule 1  . Oxcarbazepine (TRILEPTAL) 300 MG tablet TAKE 1 TABLET TWICE DAILY (Patient taking differently: Take 300 mg by mouth 2 (two) times daily. ) 180 tablet 3   No current  facility-administered medications for this visit.    Allergies  Allergen Reactions  . Zonisamide Anxiety    Panic Attacks  . Aspirin     sensitivity  . Ciprofloxacin     Seizures & it keeps her awake  . Codeine Hives  . Hydrocodone Hives  . Oxycodone Hives  . Penicillins Hives and Swelling    Did it involve swelling of the face/tongue/throat, SOB, or low BP? Yes Did it involve sudden or severe rash/hives, skin peeling, or any reaction on the inside of your mouth or nose? Yes Did you need to seek medical attention at a hospital or doctor's office? No When did it last happen?1990's If all above answers are "NO", may proceed with cephalosporin use.       Review of Systems negative except from HPI and PMH  Physical Exam  BP (!) 154/90   Pulse 77   Ht 5\' 2"  (1.575 m)   Wt 176 lb 6.4 oz (80 kg)   LMP  (LMP Unknown)   SpO2 98%   BMI 32.26 kg/m   Well developed and well nourished in no acute distress HENT normal Neck supple w  Clear Device pocket well healed; without hematoma or erythema.  There is no tethering  Regular rate and rhythm, no  gallop No * murmur Abd-soft with active BS No Clubbing cyanosis  edema Skin-warm and dry A & Oriented  Grossly normal sensory and motor function  ECG sinus at 77 intervals 19/13/42 IVCD  Assessment and  Plan  Intermittent complete heart block  IVCD  Pacemaker. Medtronic     Seizures  Hypertension  Volume overload   Pacing burden is extremely low at less than 1%.  MVP is on.  Blood pressure is elevated.  She is edematous.  She has however been increasing her sodium intake for some reason.  Instructed her to decrease sodium intake.

## 2019-07-06 ENCOUNTER — Other Ambulatory Visit: Payer: Self-pay | Admitting: Adult Health

## 2019-07-06 ENCOUNTER — Other Ambulatory Visit: Payer: Self-pay | Admitting: Neurology

## 2019-07-06 ENCOUNTER — Ambulatory Visit (INDEPENDENT_AMBULATORY_CARE_PROVIDER_SITE_OTHER): Payer: Medicare HMO | Admitting: *Deleted

## 2019-07-06 DIAGNOSIS — I442 Atrioventricular block, complete: Secondary | ICD-10-CM

## 2019-07-06 LAB — CUP PACEART REMOTE DEVICE CHECK
Battery Remaining Longevity: 81 mo
Battery Voltage: 3.01 V
Brady Statistic AP VP Percent: 0 %
Brady Statistic AP VS Percent: 0.7 %
Brady Statistic AS VP Percent: 0.04 %
Brady Statistic AS VS Percent: 99.26 %
Brady Statistic RA Percent Paced: 0.7 %
Brady Statistic RV Percent Paced: 0.04 %
Date Time Interrogation Session: 20210128095726
Implantable Lead Implant Date: 20160707
Implantable Lead Implant Date: 20160707
Implantable Lead Location: 753859
Implantable Lead Location: 753860
Implantable Lead Model: 5076
Implantable Lead Model: 5076
Implantable Pulse Generator Implant Date: 20160707
Lead Channel Impedance Value: 361 Ohm
Lead Channel Impedance Value: 380 Ohm
Lead Channel Impedance Value: 722 Ohm
Lead Channel Impedance Value: 760 Ohm
Lead Channel Pacing Threshold Amplitude: 0.5 V
Lead Channel Pacing Threshold Amplitude: 0.5 V
Lead Channel Pacing Threshold Pulse Width: 0.4 ms
Lead Channel Pacing Threshold Pulse Width: 0.4 ms
Lead Channel Sensing Intrinsic Amplitude: 23.625 mV
Lead Channel Sensing Intrinsic Amplitude: 23.625 mV
Lead Channel Sensing Intrinsic Amplitude: 3.75 mV
Lead Channel Sensing Intrinsic Amplitude: 3.75 mV
Lead Channel Setting Pacing Amplitude: 1.5 V
Lead Channel Setting Pacing Amplitude: 2.5 V
Lead Channel Setting Pacing Pulse Width: 0.4 ms
Lead Channel Setting Sensing Sensitivity: 1.2 mV

## 2019-07-06 NOTE — Progress Notes (Signed)
PPM Remote  

## 2019-07-24 NOTE — Progress Notes (Signed)
PATIENT: [name] [name] DOB: 1960-09-16  REASON FOR VISIT: follow up HISTORY FROM: patient  Virtual Visit via Telephone Note  I connected with [name] [name] on 07/24/19 at  9:00 AM EST by telephone and verified that I am speaking with the correct person using two identifiers.   I discussed the limitations, risks, security and privacy concerns of performing an evaluation and management service by telephone and the availability of in person appointments. I also discussed with the patient that there may be a patient responsible charge related to this service. The patient expressed understanding and agreed to proceed.   History of Present Illness:  07/24/19 [name] [name] is a 59 y.o. female here today for follow up for seizure. She continues lamotrigine 200mg  in am and 250mg  in pm as well as oxcarbazepine 300mg  twice daily. She is tolerating medications well. No seizure activity since 05/2018. She is tolerating medications well. No significant changes in gait or balance. She is able to perform ADL's.    History (copied from [facility] note on 11/16/2018)  [name] [name] is a 59 year old female with a history of seizures.  At the last visit Lamictal was increased.  She states that she is not had any additional seizure events.  She continues on Lamictal and Trileptal.  She is not operating a motor vehicle.  She is able to complete all ADLs independently.  Denies any significant changes with her gait or balance.  She joins me today for virtual visit.  HISTORY 05/12/18:  [name] [name] is a 59 year old female with a history of seizure type events. She returns today for follow-up. She states that this past Friday she was getting out of the car to go get her nails done. She states that she "feels off." She states that she sat down and for 30 seconds to 1 minute she felt like she was in a dream state. She states that she couldsee people but could not speak.Afterwards she had  trouble walking a straight line. Denies any loss of consciousness. No convulsing in the extremities. No loss ofbowel or bladder. She states that she has been consistent with her Lamictal and Trileptal. She denies missing any doses. She is not late taking any of her medications. Denies being sick. Denies sleep deprivation. Denies any increase in her stress level. She not had any additional events. Patient is not operating a motor vehicle.she returns today for evaluation.   Observations/Objective:  Generalized: Well developed, in no acute distress  Mentation: Alert oriented to time, place, history taking. Follows all commands speech and language fluent   Assessment and Plan:  59 y.o. year old female  has a past medical history of Arthritis, Chronic insomnia (03/07/2015), Diverticulitis, GERD (gastroesophageal reflux disease), Gout, Hiatal hernia, LBBB (left bundle branch block), Pacemaker, Panic disorder (09/07/2014), Pneumonia, Seizures ([place]), Sleep apnea, and Wrist tendonitis. here with    ICD-10-CM   1. Seizures ([place])  R56.9    Delbra is doing well. No seizure activity since 05/2018. We will continue lamotrigine and oxcarbazepine as prescribed. Labs were normal in 12/2018. Healthy, well balanced diet,adequate hydration and regular exercise advised. Seizure precautions given. Medications refilled. She will follow up in 1 year. She verbalizes understanding and agreement with this plan.    No orders of the defined types were placed in this encounter.   No orders of the defined types were placed in this encounter.    Follow Up Instructions:  I discussed the assessment and treatment plan with the patient.  The patient was provided an opportunity to ask questions and all were answered. The patient agreed with the plan and demonstrated an understanding of the instructions.   The patient was advised to call back or seek an in-person evaluation if the symptoms worsen or if the condition  fails to improve as anticipated.  I provided 15 minutes of non-face-to-face time during this encounter. Patient is at her place of residence for [place] visit, provider is in the office.    Debbora Presto, NP

## 2019-07-25 ENCOUNTER — Telehealth (INDEPENDENT_AMBULATORY_CARE_PROVIDER_SITE_OTHER): Payer: Medicare HMO | Admitting: Family Medicine

## 2019-07-25 ENCOUNTER — Encounter: Payer: Self-pay | Admitting: Family Medicine

## 2019-07-25 DIAGNOSIS — R569 Unspecified convulsions: Secondary | ICD-10-CM

## 2019-07-25 MED ORDER — LAMOTRIGINE 200 MG PO TABS: 200.0000 mg | ORAL_TABLET | Freq: Two times a day (BID) | ORAL | 3 refills | Status: DC

## 2019-07-25 MED ORDER — OXCARBAZEPINE 300 MG PO TABS: 300.0000 mg | ORAL_TABLET | Freq: Two times a day (BID) | ORAL | 3 refills | Status: DC

## 2019-07-25 MED ORDER — LAMOTRIGINE 25 MG PO TABS: ORAL_TABLET | ORAL | 3 refills | Status: DC

## 2019-07-25 NOTE — Progress Notes (Signed)
I have read the note, and I agree with the clinical assessment and plan.  Maiyah Goyne K Asael Pann   

## 2019-07-25 NOTE — Patient Instructions (Signed)
Continue current treatment regimen.   Follow up in 1 year  Seizure, Adult A seizure is a sudden burst of abnormal electrical activity in the brain. Seizures usually last from 30 seconds to 2 minutes. They can cause many different symptoms. Usually, seizures are not harmful unless they last a long time. What are the causes? Common causes of this condition include:  Fever or infection.  Conditions that affect the brain, such as: ? A brain abnormality that you were born with. ? A brain or head injury. ? Bleeding in the brain. ? A tumor. ? Stroke. ? Brain disorders such as autism or cerebral palsy.  Low blood sugar.  Conditions that are passed from parent to child (are inherited).  Problems with substances, such as: ? Having a reaction to a drug or a medicine. ? Suddenly stopping the use of a substance (withdrawal). In some cases, the cause may not be known. A person who has repeated seizures over time without a clear cause has a condition called epilepsy. What increases the risk? You are more likely to get this condition if you have:  A family history of epilepsy.  Had a seizure in the past.  A brain disorder.  A history of head injury, lack of oxygen at birth, or strokes. What are the signs or symptoms? There are many types of seizures. The symptoms vary depending on the type of seizure you have. Examples of symptoms during a seizure include:  Shaking (convulsions).  Stiffness in the body.  Passing out (losing consciousness).  Head nodding.  Staring.  Not responding to sound or touch.  Loss of bladder control and bowel control. Some people have symptoms right before and right after a seizure happens. Symptoms before a seizure may include:  Fear.  Worry (anxiety).  Feeling like you may vomit (nauseous).  Feeling like the room is spinning (vertigo).  Feeling like you saw or heard something before (dj vu).  Odd tastes or smells.  Changes in how you  see. You may see flashing lights or spots. Symptoms after a seizure happens can include:  Confusion.  Sleepiness.  Headache.  Weakness on one side of the body. How is this treated? Most seizures will stop on their own in under 5 minutes. In these cases, no treatment is needed. Seizures that last longer than 5 minutes will usually need treatment. Treatment can include:  Medicines given through an IV tube.  Avoiding things that are known to cause your seizures. These can include medicines that you take for another condition.  Medicines to treat epilepsy.  Surgery to stop the seizures. This may be needed if medicines do not help. Follow these instructions at home: Medicines  Take over-the-counter and prescription medicines only as told by your doctor.  Do not eat or drink anything that may keep your medicine from working, such as alcohol. Activity  Do not do any activities that would be dangerous if you had another seizure, like driving or swimming. Wait until your doctor says it is safe for you to do them.  If you live in the [place] ask your local DMV (department of motor vehicles) when you can drive.  Get plenty of rest. Teaching others Teach friends and family what to do when you have a seizure. They should:  Lay you on the ground.  Protect your head and body.  Loosen any tight clothing around your neck.  Turn you on your side.  Not hold you down.  Not put anything into your  mouth.  Know whether or not you need emergency care.  Stay with you until you are better.  General instructions  Contact your doctor each time you have a seizure.  Avoid anything that gives you seizures.  Keep a seizure diary. Write down: ? What you think caused each seizure. ? What you remember about each seizure.  Keep all follow-up visits as told by your doctor. This is important. Contact a doctor if:  You have another seizure.  You have seizures more often.  There is any  change in what happens during your seizures.  You keep having seizures with treatment.  You have symptoms of being sick or having an infection. Get help right away if:  You have a seizure that: ? Lasts longer than 5 minutes. ? Is different than seizures you had before. ? Makes it harder to breathe. ? Happens after you hurt your head.  You have any of these symptoms after a seizure: ? Not being able to speak. ? Not being able to use a part of your body. ? Confusion. ? A bad headache.  You have two or more seizures in a row.  You do not wake up right after a seizure.  You get hurt during a seizure. These symptoms may be an emergency. Do not wait to see if the symptoms will go away. Get medical help right away. Call your local emergency services (911 in the [place] Do not drive yourself to the hospital. Summary  Seizures usually last from 30 seconds to 2 minutes. Usually, they are not harmful unless they last a long time.  Do not eat or drink anything that may keep your medicine from working, such as alcohol.  Teach friends and family what to do when you have a seizure.  Contact your doctor each time you have a seizure. This information is not intended to replace advice given to you by your health care provider. Make sure you discuss any questions you have with your health care provider. Document Revised: 08/12/2018 Document Reviewed: 08/12/2018 Elsevier Patient Education  [place].

## 2019-10-05 ENCOUNTER — Ambulatory Visit (INDEPENDENT_AMBULATORY_CARE_PROVIDER_SITE_OTHER): Payer: Medicare HMO | Admitting: *Deleted

## 2019-10-05 DIAGNOSIS — I442 Atrioventricular block, complete: Secondary | ICD-10-CM

## 2019-10-05 LAB — CUP PACEART REMOTE DEVICE CHECK
Battery Remaining Longevity: 81 mo
Battery Voltage: 3.01 V
Brady Statistic AP VP Percent: 0 %
Brady Statistic AP VS Percent: 0.53 %
Brady Statistic AS VP Percent: 0.03 %
Brady Statistic AS VS Percent: 99.44 %
Brady Statistic RA Percent Paced: 0.53 %
Brady Statistic RV Percent Paced: 0.03 %
Date Time Interrogation Session: 20210429084635
Implantable Lead Implant Date: 20160707
Implantable Lead Implant Date: 20160707
Implantable Lead Location: 753859
Implantable Lead Location: 753860
Implantable Lead Model: 5076
Implantable Lead Model: 5076
Implantable Pulse Generator Implant Date: 20160707
Lead Channel Impedance Value: 361 Ohm
Lead Channel Impedance Value: 380 Ohm
Lead Channel Impedance Value: 703 Ohm
Lead Channel Impedance Value: 741 Ohm
Lead Channel Pacing Threshold Amplitude: 0.5 V
Lead Channel Pacing Threshold Amplitude: 0.5 V
Lead Channel Pacing Threshold Pulse Width: 0.4 ms
Lead Channel Pacing Threshold Pulse Width: 0.4 ms
Lead Channel Sensing Intrinsic Amplitude: 25.375 mV
Lead Channel Sensing Intrinsic Amplitude: 25.375 mV
Lead Channel Sensing Intrinsic Amplitude: 3.875 mV
Lead Channel Sensing Intrinsic Amplitude: 3.875 mV
Lead Channel Setting Pacing Amplitude: 1.5 V
Lead Channel Setting Pacing Amplitude: 2.5 V
Lead Channel Setting Pacing Pulse Width: 0.4 ms
Lead Channel Setting Sensing Sensitivity: 1.2 mV

## 2019-10-06 NOTE — Progress Notes (Signed)
PPM Remote  

## 2019-11-13 ENCOUNTER — Telehealth: Payer: Self-pay | Admitting: Gastroenterology

## 2019-11-13 NOTE — Telephone Encounter (Signed)
She is not due until July.  Will you let her know we will call her when it gets closer to time to set up.

## 2019-11-21 DIAGNOSIS — N6019 Diffuse cystic mastopathy of unspecified breast: Secondary | ICD-10-CM | POA: Diagnosis not present

## 2019-11-24 ENCOUNTER — Other Ambulatory Visit: Payer: Self-pay | Admitting: Family Medicine

## 2019-11-24 DIAGNOSIS — Z1231 Encounter for screening mammogram for malignant neoplasm of breast: Secondary | ICD-10-CM

## 2019-11-24 DIAGNOSIS — Z9289 Personal history of other medical treatment: Secondary | ICD-10-CM

## 2019-12-07 ENCOUNTER — Ambulatory Visit
Admission: RE | Admit: 2019-12-07 | Discharge: 2019-12-07 | Disposition: A | Discharge status: Home / Self Care | Payer: Medicare HMO | Source: Ambulatory Visit | Attending: Family Medicine | Admitting: Family Medicine

## 2019-12-07 ENCOUNTER — Other Ambulatory Visit: Payer: Self-pay

## 2019-12-07 DIAGNOSIS — Z1231 Encounter for screening mammogram for malignant neoplasm of breast: Secondary | ICD-10-CM

## 2019-12-15 ENCOUNTER — Other Ambulatory Visit: Payer: Self-pay | Admitting: Family Medicine

## 2019-12-15 DIAGNOSIS — R928 Other abnormal and inconclusive findings on diagnostic imaging of breast: Secondary | ICD-10-CM

## 2019-12-22 ENCOUNTER — Ambulatory Visit
Admission: RE | Admit: 2019-12-22 | Discharge: 2019-12-22 | Disposition: A | Discharge status: Home / Self Care | Payer: Medicare HMO | Source: Ambulatory Visit | Attending: Family Medicine | Admitting: Family Medicine

## 2019-12-22 ENCOUNTER — Other Ambulatory Visit: Payer: Self-pay

## 2019-12-22 DIAGNOSIS — R928 Other abnormal and inconclusive findings on diagnostic imaging of breast: Secondary | ICD-10-CM

## 2019-12-22 DIAGNOSIS — N6001 Solitary cyst of right breast: Secondary | ICD-10-CM | POA: Diagnosis not present

## 2019-12-22 DIAGNOSIS — R922 Inconclusive mammogram: Secondary | ICD-10-CM | POA: Diagnosis not present

## 2019-12-28 NOTE — Telephone Encounter (Signed)
Pt calling to schedule colonoscopy at the hospital. Pls call her.

## 2020-01-01 ENCOUNTER — Other Ambulatory Visit: Payer: Self-pay

## 2020-01-01 DIAGNOSIS — K635 Polyp of colon: Secondary | ICD-10-CM

## 2020-01-01 NOTE — Telephone Encounter (Signed)
The patient has been notified of this information and all questions answered.  The pt will review the instructions on MyChart and message with any questions.

## 2020-01-01 NOTE — Telephone Encounter (Signed)
Colon EMR scheduled with Dr Rush Landmark at [facility] on 9/22 at 730 am.  COVID test on 02/24/20

## 2020-01-04 ENCOUNTER — Ambulatory Visit (INDEPENDENT_AMBULATORY_CARE_PROVIDER_SITE_OTHER): Payer: Medicare HMO | Admitting: *Deleted

## 2020-01-04 DIAGNOSIS — I442 Atrioventricular block, complete: Secondary | ICD-10-CM

## 2020-01-04 LAB — CUP PACEART REMOTE DEVICE CHECK
Battery Remaining Longevity: 83 mo
Battery Voltage: 3.01 V
Brady Statistic AP VP Percent: 0.01 %
Brady Statistic AP VS Percent: 1.19 %
Brady Statistic AS VP Percent: 1.38 %
Brady Statistic AS VS Percent: 97.42 %
Brady Statistic RA Percent Paced: 1.2 %
Brady Statistic RV Percent Paced: 1.39 %
Date Time Interrogation Session: 20210729120305
Implantable Lead Implant Date: 20160707
Implantable Lead Implant Date: 20160707
Implantable Lead Location: 753859
Implantable Lead Location: 753860
Implantable Lead Model: 5076
Implantable Lead Model: 5076
Implantable Pulse Generator Implant Date: 20160707
Lead Channel Impedance Value: 361 Ohm
Lead Channel Impedance Value: 380 Ohm
Lead Channel Impedance Value: 703 Ohm
Lead Channel Impedance Value: 760 Ohm
Lead Channel Pacing Threshold Amplitude: 0.5 V
Lead Channel Pacing Threshold Amplitude: 0.5 V
Lead Channel Pacing Threshold Pulse Width: 0.4 ms
Lead Channel Pacing Threshold Pulse Width: 0.4 ms
Lead Channel Sensing Intrinsic Amplitude: 3 mV
Lead Channel Sensing Intrinsic Amplitude: 3 mV
Lead Channel Sensing Intrinsic Amplitude: 31.625 mV
Lead Channel Sensing Intrinsic Amplitude: 31.625 mV
Lead Channel Setting Pacing Amplitude: 1.5 V
Lead Channel Setting Pacing Amplitude: 2.5 V
Lead Channel Setting Pacing Pulse Width: 0.4 ms
Lead Channel Setting Sensing Sensitivity: 1.2 mV

## 2020-01-08 NOTE — Progress Notes (Signed)
Remote pacemaker transmission.   

## 2020-01-10 ENCOUNTER — Other Ambulatory Visit: Payer: Self-pay | Admitting: Adult Health

## 2020-01-10 DIAGNOSIS — R569 Unspecified convulsions: Secondary | ICD-10-CM

## 2020-02-15 ENCOUNTER — Other Ambulatory Visit: Payer: Self-pay

## 2020-02-19 ENCOUNTER — Telehealth: Payer: Self-pay | Admitting: Gastroenterology

## 2020-02-19 MED ORDER — POLYETHYLENE GLYCOL 3350 17 GM/SCOOP PO POWD
1.0000 | Freq: Every day | ORAL | 3 refills | Status: DC
Start: 2020-02-19 — End: 2020-10-21

## 2020-02-19 NOTE — Telephone Encounter (Signed)
The pt wanted to confirm her colon instructions.  I have resent her a copy of the information and discussed over the phone.  Prescription for miralax prep has been sent to the pharmacy.  The patient has been notified of this information and all questions answered.

## 2020-02-19 NOTE — Telephone Encounter (Signed)
Patient called in reference to her procedure appointment

## 2020-02-24 ENCOUNTER — Other Ambulatory Visit (HOSPITAL_COMMUNITY)
Admission: RE | Admit: 2020-02-24 | Discharge: 2020-02-24 | Disposition: A | Discharge status: Home / Self Care | Payer: Medicare HMO | Source: Ambulatory Visit | Attending: Gastroenterology | Admitting: Gastroenterology

## 2020-02-24 DIAGNOSIS — Z20822 Contact with and (suspected) exposure to covid-19: Secondary | ICD-10-CM | POA: Diagnosis not present

## 2020-02-24 DIAGNOSIS — Z01812 Encounter for preprocedural laboratory examination: Secondary | ICD-10-CM | POA: Insufficient documentation

## 2020-02-24 LAB — SARS CORONAVIRUS 2 (TAT 6-24 HRS): SARS Coronavirus 2: NEGATIVE

## 2020-02-27 NOTE — Anesthesia Preprocedure Evaluation (Addendum)
Anesthesia Evaluation  Patient identified by MRN, date of birth, ID band Patient awake    Reviewed: Allergy & Precautions, NPO status , Patient's Chart, lab work & pertinent test results  Airway Mallampati: III  TM Distance: >3 FB Neck ROM: Full    Dental  (+) Upper Dentures   Pulmonary sleep apnea , Current SmokerPatient did not abstain from smoking.,    Pulmonary exam normal breath sounds clear to auscultation       Cardiovascular Normal cardiovascular exam+ dysrhythmias (LBBB) + pacemaker  Rhythm:Regular Rate:Normal     Neuro/Psych Seizures -, Well Controlled,  PSYCHIATRIC DISORDERS Anxiety    GI/Hepatic Neg liver ROS, hiatal hernia, GERD  Medicated,Cecal polyp   Endo/Other  Obesity   Renal/GU negative Renal ROS     Musculoskeletal  (+) Arthritis ,   Abdominal   Peds  Hematology negative hematology ROS (+)   Anesthesia Other Findings   Reproductive/Obstetrics                            Anesthesia Physical Anesthesia Plan  ASA: III  Anesthesia Plan: MAC   Post-op Pain Management:    Induction:   PONV Risk Score and Plan: 1 and Propofol infusion and Treatment may vary due to age or medical condition  Airway Management Planned: Natural Airway  Additional Equipment:   Intra-op Plan:   Post-operative Plan:   Informed Consent: I have reviewed the patients History and Physical, chart, labs and discussed the procedure including the risks, benefits and alternatives for the proposed anesthesia with the patient or authorized representative who has indicated his/her understanding and acceptance.       Plan Discussed with: CRNA  Anesthesia Plan Comments:        Anesthesia Quick Evaluation

## 2020-02-28 ENCOUNTER — Other Ambulatory Visit: Payer: Self-pay

## 2020-02-28 ENCOUNTER — Ambulatory Visit (HOSPITAL_COMMUNITY): Payer: Medicare HMO | Admitting: Certified Registered"

## 2020-02-28 ENCOUNTER — Encounter (HOSPITAL_COMMUNITY)
Admission: RE | Disposition: A | Discharge status: Home / Self Care | Payer: Self-pay | Source: Home / Self Care | Attending: Gastroenterology

## 2020-02-28 ENCOUNTER — Ambulatory Visit (HOSPITAL_COMMUNITY)
Admission: RE | Admit: 2020-02-28 | Discharge: 2020-02-28 | Disposition: A | Discharge status: Home / Self Care | Payer: Medicare HMO | Attending: Gastroenterology | Admitting: Gastroenterology

## 2020-02-28 ENCOUNTER — Encounter (HOSPITAL_COMMUNITY): Payer: Self-pay | Admitting: Gastroenterology

## 2020-02-28 DIAGNOSIS — F410 Panic disorder [episodic paroxysmal anxiety] without agoraphobia: Secondary | ICD-10-CM | POA: Diagnosis not present

## 2020-02-28 DIAGNOSIS — G4730 Sleep apnea, unspecified: Secondary | ICD-10-CM | POA: Insufficient documentation

## 2020-02-28 DIAGNOSIS — Z820 Family history of epilepsy and other diseases of the nervous system: Secondary | ICD-10-CM | POA: Insufficient documentation

## 2020-02-28 DIAGNOSIS — Z833 Family history of diabetes mellitus: Secondary | ICD-10-CM | POA: Insufficient documentation

## 2020-02-28 DIAGNOSIS — K5730 Diverticulosis of large intestine without perforation or abscess without bleeding: Secondary | ICD-10-CM | POA: Insufficient documentation

## 2020-02-28 DIAGNOSIS — K219 Gastro-esophageal reflux disease without esophagitis: Secondary | ICD-10-CM | POA: Insufficient documentation

## 2020-02-28 DIAGNOSIS — K449 Diaphragmatic hernia without obstruction or gangrene: Secondary | ICD-10-CM | POA: Insufficient documentation

## 2020-02-28 DIAGNOSIS — Z885 Allergy status to narcotic agent status: Secondary | ICD-10-CM | POA: Insufficient documentation

## 2020-02-28 DIAGNOSIS — Z806 Family history of leukemia: Secondary | ICD-10-CM | POA: Insufficient documentation

## 2020-02-28 DIAGNOSIS — Z8042 Family history of malignant neoplasm of prostate: Secondary | ICD-10-CM | POA: Insufficient documentation

## 2020-02-28 DIAGNOSIS — R569 Unspecified convulsions: Secondary | ICD-10-CM | POA: Insufficient documentation

## 2020-02-28 DIAGNOSIS — F419 Anxiety disorder, unspecified: Secondary | ICD-10-CM | POA: Diagnosis not present

## 2020-02-28 DIAGNOSIS — D124 Benign neoplasm of descending colon: Secondary | ICD-10-CM | POA: Diagnosis not present

## 2020-02-28 DIAGNOSIS — K635 Polyp of colon: Secondary | ICD-10-CM

## 2020-02-28 DIAGNOSIS — Z6830 Body mass index (BMI) 30.0-30.9, adult: Secondary | ICD-10-CM | POA: Diagnosis not present

## 2020-02-28 DIAGNOSIS — E669 Obesity, unspecified: Secondary | ICD-10-CM | POA: Diagnosis not present

## 2020-02-28 DIAGNOSIS — Z836 Family history of other diseases of the respiratory system: Secondary | ICD-10-CM | POA: Insufficient documentation

## 2020-02-28 DIAGNOSIS — I447 Left bundle-branch block, unspecified: Secondary | ICD-10-CM | POA: Insufficient documentation

## 2020-02-28 DIAGNOSIS — Z881 Allergy status to other antibiotic agents status: Secondary | ICD-10-CM | POA: Diagnosis not present

## 2020-02-28 DIAGNOSIS — Z880 Allergy status to penicillin: Secondary | ICD-10-CM | POA: Diagnosis not present

## 2020-02-28 DIAGNOSIS — Z86010 Personal history of colonic polyps: Secondary | ICD-10-CM | POA: Diagnosis not present

## 2020-02-28 DIAGNOSIS — M1990 Unspecified osteoarthritis, unspecified site: Secondary | ICD-10-CM | POA: Diagnosis not present

## 2020-02-28 DIAGNOSIS — D120 Benign neoplasm of cecum: Secondary | ICD-10-CM | POA: Insufficient documentation

## 2020-02-28 DIAGNOSIS — Z950 Presence of cardiac pacemaker: Secondary | ICD-10-CM | POA: Diagnosis not present

## 2020-02-28 DIAGNOSIS — Z8371 Family history of colonic polyps: Secondary | ICD-10-CM | POA: Insufficient documentation

## 2020-02-28 DIAGNOSIS — K641 Second degree hemorrhoids: Secondary | ICD-10-CM | POA: Insufficient documentation

## 2020-02-28 DIAGNOSIS — Z8249 Family history of ischemic heart disease and other diseases of the circulatory system: Secondary | ICD-10-CM | POA: Insufficient documentation

## 2020-02-28 DIAGNOSIS — F17210 Nicotine dependence, cigarettes, uncomplicated: Secondary | ICD-10-CM | POA: Diagnosis not present

## 2020-02-28 DIAGNOSIS — K621 Rectal polyp: Secondary | ICD-10-CM | POA: Diagnosis not present

## 2020-02-28 DIAGNOSIS — Z800 Family history of malignant neoplasm of digestive organs: Secondary | ICD-10-CM | POA: Insufficient documentation

## 2020-02-28 DIAGNOSIS — Z841 Family history of disorders of kidney and ureter: Secondary | ICD-10-CM | POA: Insufficient documentation

## 2020-02-28 DIAGNOSIS — Z886 Allergy status to analgesic agent status: Secondary | ICD-10-CM | POA: Diagnosis not present

## 2020-02-28 HISTORY — PX: HEMOSTASIS CONTROL: SHX6838

## 2020-02-28 HISTORY — PX: COLONOSCOPY WITH PROPOFOL: SHX5780

## 2020-02-28 HISTORY — PX: ENDOSCOPIC MUCOSAL RESECTION: SHX6839

## 2020-02-28 HISTORY — PX: ENDOROTOR: SHX6859

## 2020-02-28 HISTORY — PX: HEMOSTASIS CLIP PLACEMENT: SHX6857

## 2020-02-28 SURGERY — COLONOSCOPY WITH PROPOFOL
Anesthesia: Monitor Anesthesia Care

## 2020-02-28 MED ORDER — LACTATED RINGERS IV SOLN
INTRAVENOUS | Status: DC
  Administered 2020-02-28: 1000 mL via INTRAVENOUS

## 2020-02-28 MED ORDER — SODIUM CHLORIDE 0.9 % IV SOLN: INTRAVENOUS | Status: DC

## 2020-02-28 MED ORDER — EPINEPHRINE 1 MG/10ML IJ SOSY
PREFILLED_SYRINGE | INTRAMUSCULAR | Status: AC
  Filled 2020-02-28: qty 10

## 2020-02-28 MED ORDER — PROPOFOL 10 MG/ML IV BOLUS
INTRAVENOUS | Status: DC | PRN
  Administered 2020-02-28: 20 mg via INTRAVENOUS

## 2020-02-28 MED ORDER — FENTANYL CITRATE (PF) 100 MCG/2ML IJ SOLN
25.0000 ug | Freq: Once | INTRAMUSCULAR | Status: AC
  Administered 2020-02-28: 25 ug via INTRAVENOUS

## 2020-02-28 MED ORDER — PROPOFOL 500 MG/50ML IV EMUL
INTRAVENOUS | Status: AC
  Filled 2020-02-28: qty 50

## 2020-02-28 MED ORDER — PROPOFOL 500 MG/50ML IV EMUL
INTRAVENOUS | Status: DC | PRN
  Administered 2020-02-28: 150 ug/kg/min via INTRAVENOUS

## 2020-02-28 MED ORDER — SODIUM CHLORIDE (PF) 0.9 % IJ SOLN
PREFILLED_SYRINGE | INTRAMUSCULAR | Status: DC | PRN
  Administered 2020-02-28: 4 mL

## 2020-02-28 MED ORDER — PROPOFOL 10 MG/ML IV BOLUS
INTRAVENOUS | Status: AC
  Filled 2020-02-28: qty 20

## 2020-02-28 MED ORDER — FENTANYL CITRATE (PF) 100 MCG/2ML IJ SOLN
INTRAMUSCULAR | Status: AC
  Filled 2020-02-28: qty 2

## 2020-02-28 SURGICAL SUPPLY — 21 items

## 2020-02-28 NOTE — Anesthesia Procedure Notes (Signed)
Procedure Name: MAC Date/Time: 02/28/2020 7:49 AM Performed by: Eben Burow, CRNA Pre-anesthesia Checklist: Patient identified, Emergency Drugs available, Suction available, Patient being monitored and Timeout performed Oxygen Delivery Method: Simple face mask Placement Confirmation: positive ETCO2

## 2020-02-28 NOTE — Progress Notes (Signed)
Patient having discomfort post-colonoscopy with EMR via Endorotor. No evidence of significant muscle injury/defect, so I suspect it is from the Epinephrine but could also be gas related. Will monitor closely for next 10-minutes. If issues persist then she will need a KUB 3-view (Flat/Upright/Decubitus) and a CXR Upright so that we can rule out any evidence of perforation. Discomfort from epinephrine can last a few hours post procedure as well. NABS on exam with discomfort in midepigastrium but no rebound.. Has not passed gas as of yet. Will get some Fentanyl and get her UOB as well soon. Patient's sister updated.  Justice Britain, MD [facility] Office # [phone]

## 2020-02-28 NOTE — Transfer of Care (Signed)
Immediate Anesthesia Transfer of Care Note  Patient: [name] [name]  Procedure(s) Performed: COLONOSCOPY WITH PROPOFOL (N/A ) ENDOSCOPIC MUCOSAL RESECTION (N/A ) ENDOROTOR HEMOSTASIS CLIP PLACEMENT HEMOSTASIS CONTROL  Patient Location: PACU and Endoscopy Unit  Anesthesia Type:MAC  Level of Consciousness: awake, drowsy and patient cooperative  Airway & Oxygen Therapy: Patient Spontanous Breathing and Patient connected to face mask oxygen  Post-op Assessment: Report given to RN and Post -op Vital signs reviewed and stable  Post vital signs: Reviewed and stable  Last Vitals:  Vitals Value Taken Time  BP 178/99 02/28/20 0922  Temp    Pulse 85 02/28/20 0924  Resp 23 02/28/20 0924  SpO2 100 % 02/28/20 0924  Vitals shown include unvalidated device data.  Last Pain:  Vitals:   02/28/20 0644  TempSrc: Oral  PainSc: 0-No pain         Complications: No complications documented.

## 2020-02-28 NOTE — Discharge Instructions (Signed)
YOU HAD AN ENDOSCOPIC PROCEDURE TODAY: Refer to the procedure report and other information in the discharge instructions given to you for any specific questions about what was found during the examination. If this information does not answer your questions, please call [place] office at [phone] to clarify.  ° °YOU SHOULD EXPECT: Some feelings of bloating in the abdomen. Passage of more gas than usual. Walking can help get rid of the air that was put into your GI tract during the procedure and reduce the bloating. If you had a lower endoscopy (such as a colonoscopy or flexible sigmoidoscopy) you may notice spotting of blood in your stool or on the toilet paper. Some abdominal soreness may be present for a day or two, also. ° °DIET: Your first meal following the procedure should be a light meal and then it is ok to progress to your normal diet. A half-sandwich or bowl of soup is an example of a good first meal. Heavy or fried foods are harder to digest and may make you feel nauseous or bloated. Drink plenty of fluids but you should avoid alcoholic beverages for 24 hours. If you had a esophageal dilation, please see attached instructions for diet.   ° °ACTIVITY: Your care partner should take you home directly after the procedure. You should plan to take it easy, moving slowly for the rest of the day. You can resume normal activity the day after the procedure however YOU SHOULD NOT DRIVE, use power tools, machinery or perform tasks that involve climbing or major physical exertion for 24 hours (because of the sedation medicines used during the test).  ° °SYMPTOMS TO REPORT IMMEDIATELY: °A gastroenterologist can be reached at any hour. Please call [phone]  for any of the following symptoms:  °Following lower endoscopy (colonoscopy, flexible sigmoidoscopy) °Excessive amounts of blood in the stool  °Significant tenderness, worsening of abdominal pains  °Swelling of the abdomen that is new, acute  °Fever of 100° or  higher  °Following upper endoscopy (EGD, EUS, ERCP, esophageal dilation) °Vomiting of blood or coffee ground material  °New, significant abdominal pain  °New, significant chest pain or pain under the shoulder blades  °Painful or persistently difficult swallowing  °New shortness of breath  °Black, tarry-looking or red, bloody stools ° °FOLLOW UP:  °If any biopsies were taken you will be contacted by phone or by letter within the next 1-3 weeks. Call [phone]  if you have not heard about the biopsies in 3 weeks.  °Please also call with any specific questions about appointments or follow up tests. ° °

## 2020-02-28 NOTE — H&P (Signed)
GASTROENTEROLOGY PROCEDURE H&P NOTE   Primary Care Physician: Aretta Nip, MD  HPI: [name] [name] is a 59 y.o. female who presents for Colonoscopy with possible EMR of prior Cecal Polyp s/p piecemeal resection.  Past Medical History:  Diagnosis Date  . Arthritis   . Chronic insomnia 03/07/2015  . Diverticulitis   . GERD (gastroesophageal reflux disease)   . Gout   . Hiatal hernia   . LBBB (left bundle branch block)   . Pacemaker    [employer]   . Panic disorder 09/07/2014  . Pneumonia    x 1 - Walking  . Seizures ([place])    Last Seizure in 2017 - controlled with meds   . Sleep apnea    does not use a CPAP  . Wrist tendonitis    R    Past Surgical History:  Procedure Laterality Date  . COLONOSCOPY  last 03/31/2016  . COLONOSCOPY WITH PROPOFOL N/A 04/04/2019   Procedure: COLONOSCOPY WITH PROPOFOL;  Surgeon: Mauri Pole, MD;  Location: [facility];  Service: Endoscopy;  Laterality: N/A;  . COLONOSCOPY WITH PROPOFOL N/A 06/15/2019   Procedure: COLONOSCOPY WITH PROPOFOL;  Surgeon: Rush Landmark Telford Nab., MD;  Location: [place];  Service: Gastroenterology;  Laterality: N/A;  . ENDOSCOPIC MUCOSAL RESECTION N/A 06/15/2019   Procedure: ENDOSCOPIC MUCOSAL RESECTION;  Surgeon: Rush Landmark Telford Nab., MD;  Location: [place];  Service: Gastroenterology;  Laterality: N/A;  . EP IMPLANTABLE DEVICE N/A 12/13/2014   Procedure: Pacemaker Implant;  Surgeon: Deboraha Sprang, MD;  Location: [place] [facility];  Service: Cardiovascular;  Laterality: N/A;  . HAND SURGERY    . HEMOSTASIS CLIP PLACEMENT  06/15/2019   Procedure: HEMOSTASIS CLIP PLACEMENT;  Surgeon: Irving Copas., MD;  Location: [place];  Service: Gastroenterology;;  . POLYPECTOMY    . POLYPECTOMY  04/04/2019   Procedure: POLYPECTOMY;  Surgeon: Mauri Pole, MD;  Location: [facility];  Service: Endoscopy;;  . POLYPECTOMY  06/15/2019   Procedure: POLYPECTOMY;  Surgeon: Irving Copas., MD;  Location: [place];  Service: Gastroenterology;;  . Lia Foyer LIFTING INJECTION  06/15/2019   Procedure: SUBMUCOSAL LIFTING INJECTION;  Surgeon: Irving Copas., MD;  Location: [place];  Service: Gastroenterology;;  . TUBAL LIGATION    . UPPER GI ENDOSCOPY    . WISDOM TOOTH EXTRACTION     Current Facility-Administered Medications  Medication Dose Route Frequency Provider Last Rate Last Admin  . 0.9 %  sodium chloride infusion   Intravenous Continuous Mansouraty, Telford Nab., MD      . lactated ringers infusion   Intravenous Continuous Mansouraty, Telford Nab., MD 10 mL/hr at 02/28/20 0709 1,000 mL at 02/28/20 0709   Allergies  Allergen Reactions  . Zonisamide Anxiety    Panic Attacks  . Aspirin     sensitivity  . Ciprofloxacin     Seizures & it keeps her awake  . Codeine Hives  . Hydrocodone Hives  . Oxycodone Hives  . Penicillins Hives and Swelling    Did it involve swelling of the face/tongue/throat, SOB, or low BP? Yes Did it involve sudden or severe rash/hives, skin peeling, or any reaction on the inside of your mouth or nose? Yes Did you need to seek medical attention at a hospital or doctor's office? No When did it last happen?1990's If all above answers are "NO", may proceed with cephalosporin use.    Family History  Problem Relation Age of Onset  . Alzheimer's disease Mother   . Hypertension  Mother   . Colon polyps Mother   . Hypertension Father   . Stroke Father   . Leukemia Father   . Prostate cancer Father   . Colon polyps Sister   . Stroke Brother   . Hypertension Brother   . Sleep apnea Brother   . Colon polyps Brother   . Kidney failure Sister   . Diabetes Sister   . Colon polyps Sister   . Coronary artery disease Maternal Grandfather   . Parkinson's disease Maternal Grandmother   . Colon cancer Paternal Aunt   . Esophageal cancer Neg Hx   . Stomach cancer Neg Hx   . Rectal cancer Neg Hx   . Inflammatory bowel  disease Neg Hx   . Liver disease Neg Hx   . Pancreatic cancer Neg Hx    Social History   Socioeconomic History  . Marital status: Single    Spouse name: Not on file  . Number of children: 0  . Years of education: 16  . Highest education level: Not on file  Occupational History  . Not on file  Tobacco Use  . Smoking status: Current Every Day Smoker    Packs/day: 0.50    Years: 45.00    Pack years: 22.50    Types: Cigarettes  . Smokeless tobacco: Never Used  Vaping Use  . Vaping Use: Never used  Substance and Sexual Activity  . Alcohol use: No    Alcohol/week: 0.0 standard drinks    Comment: Quit 12/06/2009  . Drug use: No  . Sexual activity: Not on file  Other Topics Concern  . Not on file  Social History Narrative   Patient drinks 24 oz of soda daily.   Lives with sister   Some college   Left handed   Social Determinants of Health   Financial Resource Strain:   . Difficulty of Paying Living Expenses: Not on file  Food Insecurity:   . Worried About Charity fundraiser in the Last Year: Not on file  . Ran Out of Food in the Last Year: Not on file  Transportation Needs:   . Lack of Transportation (Medical): Not on file  . Lack of Transportation (Non-Medical): Not on file  Physical Activity:   . Days of Exercise per Week: Not on file  . Minutes of Exercise per Session: Not on file  Stress:   . Feeling of Stress : Not on file  Social Connections:   . Frequency of Communication with Friends and Family: Not on file  . Frequency of Social Gatherings with Friends and Family: Not on file  . Attends Religious Services: Not on file  . Active Member of Clubs or Organizations: Not on file  . Attends Archivist Meetings: Not on file  . Marital Status: Not on file  Intimate Partner Violence:   . Fear of Current or Ex-Partner: Not on file  . Emotionally Abused: Not on file  . Physically Abused: Not on file  . Sexually Abused: Not on file    Physical  Exam: Vital signs in last 24 hours: Temp:  [98.6 F (37 C)] 98.6 F (37 C) (09/22 0644) Pulse Rate:  [78] 78 (09/22 0644) Resp:  [17] 17 (09/22 0644) BP: (175)/(80) 175/80 (09/22 0644) SpO2:  [98 %] 98 % (09/22 0644) Weight:  [76.2 kg] 76.2 kg (09/22 0644)   GEN: NAD EYE: Sclerae anicteric ENT: MMM CV: Non-tachycardic GI: Soft, NT/ND NEURO:  Alert & Oriented x 3  Lab  Results: No results for input(s): WBC, HGB, HCT, PLT in the last 72 hours. BMET No results for input(s): NA, K, CL, CO2, GLUCOSE, BUN, CREATININE, CALCIUM in the last 72 hours. LFT No results for input(s): PROT, ALBUMIN, AST, ALT, ALKPHOS, BILITOT, BILIDIR, IBILI in the last 72 hours. PT/INR No results for input(s): LABPROT, INR in the last 72 hours.   Impression / Plan: This is a 59 y.o.female who presents for Colonoscopy with possible EMR of prior Cecal Polyp s/p piecemeal resection.  The risks and benefits of endoscopic evaluation were discussed with the patient; these include but are not limited to the risk of perforation, infection, bleeding, missed lesions, lack of diagnosis, severe illness requiring hospitalization, as well as anesthesia and sedation related illnesses.  The patient is agreeable to proceed.    Justice Britain, MD [facility] Office # [phone]

## 2020-02-28 NOTE — Anesthesia Postprocedure Evaluation (Signed)
Anesthesia Post Note  Patient: [name] [name]  Procedure(s) Performed: COLONOSCOPY WITH PROPOFOL (N/A ) ENDOSCOPIC MUCOSAL RESECTION (N/A ) ENDOROTOR HEMOSTASIS CLIP PLACEMENT HEMOSTASIS CONTROL     Patient location during evaluation: Endoscopy Anesthesia Type: MAC Level of consciousness: awake and alert Pain management: pain level controlled Vital Signs Assessment: post-procedure vital signs reviewed and stable Respiratory status: spontaneous breathing, nonlabored ventilation, respiratory function stable and patient connected to nasal cannula oxygen Cardiovascular status: stable and blood pressure returned to baseline Postop Assessment: no apparent nausea or vomiting Anesthetic complications: no   No complications documented.  Last Vitals:  Vitals:   02/28/20 1030 02/28/20 1100  BP: (!) 184/78 (!) 179/92  Pulse: 74 87  Resp: 17 15  Temp:    SpO2: 97% 94%    Last Pain:  Vitals:   02/28/20 1100  TempSrc:   PainSc: 0-No pain                 Catalina Gravel

## 2020-02-28 NOTE — Op Note (Signed)
[facility] Patient Name: [name] [name] Procedure Date: 02/28/2020 MRN: [id number] Attending MD: Justice Britain , MD Date of Birth: Dec 16, 1960 CSN: [id number] Age: 59 Admit Type: Outpatient Procedure:                Colonoscopy Indications:              Surveillance: Personal history of piecemeal removal                            of adenoma on last colonoscopy (less than 1 year                            ago) Providers:                Justice Britain, MD, Baird Cancer, RN, Faustina                            Mbumina, Technician, Laverda Sorenson, Technician,                            Jefm Miles CRNA Referring MD:             Mauri Pole, MD, Bill Salinas. Rankins Medicines:                Monitored Anesthesia Care Complications:            No immediate complications. Estimated Blood Loss:     Estimated blood loss was minimal. Procedure:                Pre-Anesthesia Assessment:                           - Prior to the procedure, a History and Physical                            was performed, and patient medications and                            allergies were reviewed. The patient's tolerance of                            previous anesthesia was also reviewed. The risks                            and benefits of the procedure and the sedation                            options and risks were discussed with the patient.                            All questions were answered, and informed consent                            was obtained. Prior Anticoagulants: The patient has  taken no previous anticoagulant or antiplatelet                            agents except for aspirin. ASA Grade Assessment: II                            - A patient with mild systemic disease. After                            reviewing the risks and benefits, the patient was                            deemed in satisfactory condition to undergo the                             procedure.                           After obtaining informed consent, the colonoscope                            was passed under direct vision. Throughout the                            procedure, the patient's blood pressure, pulse, and                            oxygen saturations were monitored continuously. The                            CF-HQ190L ([id number] Olympus colonoscope was                            introduced through the anus and advanced to the 5                            cm into the ileum. The colonoscopy was performed                            without difficulty. The patient tolerated the                            procedure. The quality of the bowel preparation was                            good. The terminal ileum, ileocecal valve,                            appendiceal orifice, and rectum were photographed. Scope In: 7:55:31 AM Scope Out: 9:12:48 AM Scope Withdrawal Time: 1 hour 14 minutes 9 seconds  Total Procedure Duration: 1 hour 17 minutes 17 seconds  Findings:      The digital exam findings include hemorrhoids. Pertinent negatives       include no palpable rectal lesions.  The terminal ileum and ileocecal valve appeared normal.      A medium post mucosectomy scar was found in the cecum extending to the       peri-appendiceal os. There was residual polypoid tissue within the       middle of the scar. No evidence of polypoid tissue into the appendiceal       os was noted. Preparations were made for mucosal resection and decision       was made to attempt Endorotor since I could better control resection..       With NBI imaging and white-light endoscopy I was able to demarcate the       borders of the lesion for resection. A 1:100000 solution of epinephrine       was injected to raise the lesion (total of 4 mL) but also to decrease       bleeding risk. Endorotor Powered Endoscopic Resection via morcellation       was performed. Resection  and retrieval were complete. To prevent       bleeding post-intervention, six hemostatic clips were successfully       placed (MR conditional). There was no bleeding at the end of the       procedure.      Two sessile polyps were found in the proximal descending colon. The       polyps were 2 to 4 mm in size. These polyps were removed with a cold       snare. Resection and retrieval were complete.      Many sessile polyps were found in the rectum, recto-sigmoid colon and       sigmoid colon. The polyps were small in size. Polypectomy was not       attempted these have previously been sampled/removed and returned as       hyperplastic.      A few small-mouthed diverticula were found in the sigmoid colon.      Normal mucosa was found in the entire colon otherwise.      Non-bleeding non-thrombosed internal hemorrhoids were found during       retroflexion, during perianal exam and during digital exam. The       hemorrhoids were Grade II (internal hemorrhoids that prolapse but reduce       spontaneously). Impression:               - Hemorrhoids found on digital exam.                           - The examined portion of the ileum was normal.                           - Post mucosectomy scar in the cecum extending to                            the near appendiceal orifice. Polypoid tissue                            present concerning for recurrence in middle of                            scar. Endorotor Powered Endoscopic Resection via  morcellation performed after lift. Clips (MR                            conditional) were placed to close defect after                            completion.                           - Two 2 to 4 mm polyps in the proximal descending                            colon, removed with a cold snare. Resected and                            retrieved.                           - Many small polyps in the rectum, at the                             recto-sigmoid colon and in the sigmoid colon.                            Resection not attempted - consistent with prior                            hyperplastic polyps.                           - Diverticulosis in the sigmoid colon.                           - Normal mucosa in the entire examined colon                            otherwise.                           - Non-bleeding non-thrombosed internal hemorrhoids. Moderate Sedation:      Not Applicable - Patient had care per Anesthesia. Recommendation:           - The patient will be observed post-procedure,                            until all discharge criteria are met.                           - Discharge patient to home.                           - Patient has a contact number available for                            emergencies. The signs and symptoms of potential  delayed complications were discussed with the                            patient. Return to normal activities tomorrow.                            Written discharge instructions were provided to the                            patient.                           - High fiber diet.                           - Continue present medications.                           - No aspirin, ibuprofen, naproxen, or other                            non-steroidal anti-inflammatory drugs for 2 weeks.                           - Monitor for signs/symptoms of bleeding,                            perforation, and infection. If issues please call                            our number to get further assistance as needed.                           - Await pathology results.                           - Repeat colonoscopy in 6-9 months for                            surveillance. May need to consider Avulsion or FTRD                            on next if anything recurs.                           - The findings and recommendations were discussed                             with the patient.                           - The findings and recommendations were discussed                            with the patient's family. Procedure Code(s):        --- Professional ---  03403, Colonoscopy, flexible; with endoscopic                            mucosal resection                           45385, 59, Colonoscopy, flexible; with removal of                            tumor(s), polyp(s), or other lesion(s) by snare                            technique Diagnosis Code(s):        --- Professional ---                           K64.1, Second degree hemorrhoids                           Z98.890, Other specified postprocedural states                           K62.1, Rectal polyp                           K63.5, Polyp of colon                           Z09, Encounter for follow-up examination after                            completed treatment for conditions other than                            malignant neoplasm                           Z86.010, Personal history of colonic polyps                           K57.30, Diverticulosis of large intestine without                            perforation or abscess without bleeding CPT copyright 2019 [employer]. All rights reserved. The codes documented in this report are preliminary and upon coder review may  be revised to meet current compliance requirements. Justice Britain, MD 02/28/2020 9:35:05 AM Number of Addenda: 0

## 2020-02-29 ENCOUNTER — Encounter (HOSPITAL_COMMUNITY): Payer: Self-pay | Admitting: Gastroenterology

## 2020-02-29 LAB — SURGICAL PATHOLOGY

## 2020-03-03 ENCOUNTER — Encounter: Payer: Self-pay | Admitting: Gastroenterology

## 2020-03-11 DIAGNOSIS — R030 Elevated blood-pressure reading, without diagnosis of hypertension: Secondary | ICD-10-CM | POA: Diagnosis not present

## 2020-03-11 DIAGNOSIS — Z136 Encounter for screening for cardiovascular disorders: Secondary | ICD-10-CM | POA: Diagnosis not present

## 2020-03-11 DIAGNOSIS — D229 Melanocytic nevi, unspecified: Secondary | ICD-10-CM | POA: Diagnosis not present

## 2020-03-18 DIAGNOSIS — C4359 Malignant melanoma of other part of trunk: Secondary | ICD-10-CM | POA: Diagnosis not present

## 2020-03-18 DIAGNOSIS — L821 Other seborrheic keratosis: Secondary | ICD-10-CM | POA: Diagnosis not present

## 2020-03-18 DIAGNOSIS — L814 Other melanin hyperpigmentation: Secondary | ICD-10-CM | POA: Diagnosis not present

## 2020-03-18 DIAGNOSIS — D225 Melanocytic nevi of trunk: Secondary | ICD-10-CM | POA: Diagnosis not present

## 2020-03-18 DIAGNOSIS — D485 Neoplasm of uncertain behavior of skin: Secondary | ICD-10-CM | POA: Diagnosis not present

## 2020-03-18 DIAGNOSIS — D2261 Melanocytic nevi of right upper limb, including shoulder: Secondary | ICD-10-CM | POA: Diagnosis not present

## 2020-03-24 ENCOUNTER — Other Ambulatory Visit: Payer: Self-pay

## 2020-03-24 ENCOUNTER — Emergency Department (HOSPITAL_COMMUNITY): Payer: Medicare HMO

## 2020-03-24 ENCOUNTER — Encounter (HOSPITAL_COMMUNITY): Payer: Self-pay

## 2020-03-24 ENCOUNTER — Emergency Department (HOSPITAL_COMMUNITY)
Admission: EM | Admit: 2020-03-24 | Discharge: 2020-03-24 | Disposition: A | Discharge status: Home / Self Care | Payer: Medicare HMO | Attending: Emergency Medicine | Admitting: Emergency Medicine

## 2020-03-24 DIAGNOSIS — A09 Infectious gastroenteritis and colitis, unspecified: Secondary | ICD-10-CM | POA: Diagnosis not present

## 2020-03-24 DIAGNOSIS — I700 Atherosclerosis of aorta: Secondary | ICD-10-CM | POA: Diagnosis not present

## 2020-03-24 DIAGNOSIS — Z8719 Personal history of other diseases of the digestive system: Secondary | ICD-10-CM | POA: Diagnosis not present

## 2020-03-24 DIAGNOSIS — Z950 Presence of cardiac pacemaker: Secondary | ICD-10-CM | POA: Insufficient documentation

## 2020-03-24 DIAGNOSIS — R109 Unspecified abdominal pain: Secondary | ICD-10-CM | POA: Diagnosis not present

## 2020-03-24 DIAGNOSIS — K219 Gastro-esophageal reflux disease without esophagitis: Secondary | ICD-10-CM | POA: Diagnosis not present

## 2020-03-24 DIAGNOSIS — F17210 Nicotine dependence, cigarettes, uncomplicated: Secondary | ICD-10-CM | POA: Diagnosis not present

## 2020-03-24 DIAGNOSIS — K529 Noninfective gastroenteritis and colitis, unspecified: Secondary | ICD-10-CM | POA: Diagnosis not present

## 2020-03-24 LAB — CBC
HCT: 51.5 % — ABNORMAL HIGH (ref 36.0–46.0)
Hemoglobin: 17.3 g/dL — ABNORMAL HIGH (ref 12.0–15.0)
MCH: 30.5 pg (ref 26.0–34.0)
MCHC: 33.6 g/dL (ref 30.0–36.0)
MCV: 90.7 fL (ref 80.0–100.0)
Platelets: 271 10*3/uL (ref 150–400)
RBC: 5.68 MIL/uL — ABNORMAL HIGH (ref 3.87–5.11)
RDW: 13.5 % (ref 11.5–15.5)
WBC: 21.1 10*3/uL — ABNORMAL HIGH (ref 4.0–10.5)
nRBC: 0 % (ref 0.0–0.2)

## 2020-03-24 LAB — LACTIC ACID, PLASMA
Lactic Acid, Venous: 2.1 mmol/L (ref 0.5–1.9)
Lactic Acid, Venous: 1.5 mmol/L (ref 0.5–1.9)

## 2020-03-24 LAB — COMPREHENSIVE METABOLIC PANEL
ALT: 17 U/L (ref 0–44)
AST: 21 U/L (ref 15–41)
Albumin: 4.8 g/dL (ref 3.5–5.0)
Alkaline Phosphatase: 136 U/L — ABNORMAL HIGH (ref 38–126)
Anion gap: 14 (ref 5–15)
BUN: 12 mg/dL (ref 6–20)
CO2: 18 mmol/L — ABNORMAL LOW (ref 22–32)
Calcium: 9.4 mg/dL (ref 8.9–10.3)
Chloride: 105 mmol/L (ref 98–111)
Creatinine, Ser: 1.01 mg/dL — ABNORMAL HIGH (ref 0.44–1.00)
GFR, Estimated: >60 mL/min (ref >60)
Glucose, Bld: 152 mg/dL — ABNORMAL HIGH (ref 70–99)
Potassium: 4.1 mmol/L (ref 3.5–5.1)
Sodium: 137 mmol/L (ref 135–145)
Total Bilirubin: 0.5 mg/dL (ref 0.3–1.2)
Total Protein: 8.2 g/dL — ABNORMAL HIGH (ref 6.5–8.1)

## 2020-03-24 LAB — URINALYSIS, ROUTINE W REFLEX MICROSCOPIC
Bacteria, UA: NONE SEEN
Bilirubin Urine: NEGATIVE
Glucose, UA: NEGATIVE mg/dL
Ketones, ur: NEGATIVE mg/dL
Leukocytes,Ua: NEGATIVE
Nitrite: NEGATIVE
Protein, ur: 100 mg/dL — AB
Specific Gravity, Urine: 1.038 — ABNORMAL HIGH (ref 1.005–1.030)
pH: 6 (ref 5.0–8.0)

## 2020-03-24 LAB — LIPASE, BLOOD: Lipase: 23 U/L (ref 11–51)

## 2020-03-24 MED ORDER — ONDANSETRON HCL 4 MG/2ML IJ SOLN
4.0000 mg | Freq: Once | INTRAMUSCULAR | Status: AC
  Administered 2020-03-24: 4 mg via INTRAVENOUS
  Filled 2020-03-24: qty 2

## 2020-03-24 MED ORDER — SODIUM CHLORIDE 0.9 % IV BOLUS
1000.0000 mL | Freq: Once | INTRAVENOUS | Status: AC
  Administered 2020-03-24: 1000 mL via INTRAVENOUS

## 2020-03-24 MED ORDER — SODIUM CHLORIDE (PF) 0.9 % IJ SOLN
INTRAMUSCULAR | Status: AC
  Filled 2020-03-24: qty 50

## 2020-03-24 MED ORDER — DICYCLOMINE HCL 20 MG PO TABS: 20.0000 mg | ORAL_TABLET | Freq: Two times a day (BID) | ORAL | 0 refills | Status: DC

## 2020-03-24 MED ORDER — IOHEXOL 300 MG/ML  SOLN
100.0000 mL | Freq: Once | INTRAMUSCULAR | Status: AC | PRN
  Administered 2020-03-24: 100 mL via INTRAVENOUS

## 2020-03-24 MED ORDER — SULFAMETHOXAZOLE-TRIMETHOPRIM 800-160 MG PO TABS: 1.0000 | ORAL_TABLET | Freq: Two times a day (BID) | ORAL | 0 refills | Status: AC

## 2020-03-24 MED ORDER — SULFAMETHOXAZOLE-TRIMETHOPRIM 800-160 MG PO TABS
1.0000 | ORAL_TABLET | Freq: Once | ORAL | Status: AC
  Administered 2020-03-24: 1 via ORAL
  Filled 2020-03-24: qty 1

## 2020-03-24 MED ORDER — DICYCLOMINE HCL 10 MG PO CAPS
20.0000 mg | ORAL_CAPSULE | Freq: Once | ORAL | Status: AC
  Administered 2020-03-24: 20 mg via ORAL
  Filled 2020-03-24: qty 2

## 2020-03-24 MED ORDER — ONDANSETRON 4 MG PO TBDP: 4.0000 mg | ORAL_TABLET | Freq: Three times a day (TID) | ORAL | 0 refills | Status: DC | PRN

## 2020-03-24 MED ORDER — MORPHINE SULFATE (PF) 4 MG/ML IV SOLN
4.0000 mg | Freq: Once | INTRAVENOUS | Status: AC
  Administered 2020-03-24: 4 mg via INTRAVENOUS
  Filled 2020-03-24: qty 1

## 2020-03-24 NOTE — ED Provider Notes (Signed)
[facility] DEPT Provider Note   CSN: [id number] Arrival date & time: 03/24/20  1428     History Chief Complaint  Patient presents with  . Abdominal Pain    [name] [name] is a 59 y.o. female with PMHx seizures, GERD, and diverticulitis who presents to the ED today with complaint of sudden onset, constant, diffuse, sharp, 10/10, abdominal pain that began this morning around 9 AM. Pt also complains of nausea, BRBPR, and chills. She reports that she never vomits after EGD done in the 1990s. She denies fevers. She reports hx of diverticulitis however states this feels different. She took GasX earlier today without relief. Pt denies any recent suspicious food intake, foreign travel, or antibiotic use. She is fully vaccinated against COVID 19.   The history is provided by the patient and medical records.       Past Medical History:  Diagnosis Date  . Arthritis   . Chronic insomnia 03/07/2015  . Diverticulitis   . GERD (gastroesophageal reflux disease)   . Gout   . Hiatal hernia   . LBBB (left bundle branch block)   . Pacemaker    Medtronic   . Panic disorder 09/07/2014  . Pneumonia    x 1 - Walking  . Seizures ([place])    Last Seizure in 2017 - controlled with meds   . Sleep apnea    does not use a CPAP  . Wrist tendonitis    R     Patient Active Problem List   Diagnosis Date Noted  . History of colonic polyps   . Polyp of rectum   . Routine general medical examination at a health care facility 03/02/2016  . Obesity 03/02/2016  . GERD (gastroesophageal reflux disease) 02/19/2016  . Chronic insomnia 03/07/2015  . Cardiac device in situ   . Complete heart block ([place])   . LBBB (left bundle branch block) 10/11/2014  . Tobacco abuse 10/11/2014  . Panic disorder 09/07/2014  . Spells 07/06/2014  . Convulsions/seizures ([place]) 12/15/2013    Past Surgical History:  Procedure Laterality Date  . COLONOSCOPY  last 03/31/2016  . COLONOSCOPY WITH  PROPOFOL N/A 04/04/2019   Procedure: COLONOSCOPY WITH PROPOFOL;  Surgeon: Mauri Pole, MD;  Location: [facility];  Service: Endoscopy;  Laterality: N/A;  . COLONOSCOPY WITH PROPOFOL N/A 06/15/2019   Procedure: COLONOSCOPY WITH PROPOFOL;  Surgeon: Rush Landmark Telford Nab., MD;  Location: [facility];  Service: Gastroenterology;  Laterality: N/A;  . COLONOSCOPY WITH PROPOFOL N/A 02/28/2020   Procedure: COLONOSCOPY WITH PROPOFOL;  Surgeon: Rush Landmark Telford Nab., MD;  Location: [facility];  Service: Gastroenterology;  Laterality: N/A;  . ENDOROTOR  02/28/2020   Procedure: [id number];  Surgeon: Mansouraty, Telford Nab., MD;  Location: [facility];  Service: Gastroenterology;;  . ENDOSCOPIC MUCOSAL RESECTION N/A 06/15/2019   Procedure: ENDOSCOPIC MUCOSAL RESECTION;  Surgeon: Irving Copas., MD;  Location: [facility];  Service: Gastroenterology;  Laterality: N/A;  . ENDOSCOPIC MUCOSAL RESECTION N/A 02/28/2020   Procedure: ENDOSCOPIC MUCOSAL RESECTION;  Surgeon: Rush Landmark Telford Nab., MD;  Location: [facility];  Service: Gastroenterology;  Laterality: N/A;  . EP IMPLANTABLE DEVICE N/A 12/13/2014   Procedure: Pacemaker Implant;  Surgeon: Deboraha Sprang, MD;  Location: [facility];  Service: Cardiovascular;  Laterality: N/A;  . HAND SURGERY    . HEMOSTASIS CLIP PLACEMENT  06/15/2019   Procedure: HEMOSTASIS CLIP PLACEMENT;  Surgeon: Irving Copas., MD;  Location: [facility];  Service: Gastroenterology;;  . [place]  02/28/2020   Procedure: HEMOSTASIS CLIP PLACEMENT;  Surgeon: Irving Copas., MD;  Location: [facility];  Service: Gastroenterology;;  . Laretta Alstrom CONTROL  02/28/2020   Procedure: HEMOSTASIS CONTROL;  Surgeon: Irving Copas., MD;  [facility];  Service: Gastroenterology;;  . POLYPECTOMY    . POLYPECTOMY  04/04/2019   Procedure: POLYPECTOMY;  Surgeon: Mauri Pole, MD;  [facility];  Service:  Endoscopy;;  . POLYPECTOMY  06/15/2019   Procedure: POLYPECTOMY;  Surgeon: Irving Copas., MD;  Location: [facility];  Service: Gastroenterology;;  . Lia Foyer LIFTING INJECTION  06/15/2019   Procedure: SUBMUCOSAL LIFTING INJECTION;  Surgeon: Irving Copas., MD;  Location: [facility];  Service: Gastroenterology;;  . TUBAL LIGATION    . UPPER GI ENDOSCOPY    . WISDOM TOOTH EXTRACTION       OB History   No obstetric history on file.     Family History  Problem Relation Age of Onset  . Alzheimer's disease Mother   . Hypertension Mother   . Colon polyps Mother   . Hypertension Father   . Stroke Father   . Leukemia Father   . Prostate cancer Father   . Colon polyps Sister   . Stroke Brother   . Hypertension Brother   . Sleep apnea Brother   . Colon polyps Brother   . Kidney failure Sister   . Diabetes Sister   . Colon polyps Sister   . Coronary artery disease Maternal Grandfather   . Parkinson's disease Maternal Grandmother   . Colon cancer Paternal Aunt   . Esophageal cancer Neg Hx   . Stomach cancer Neg Hx   . Rectal cancer Neg Hx   . Inflammatory bowel disease Neg Hx   . Liver disease Neg Hx   . Pancreatic cancer Neg Hx     Social History   Tobacco Use  . Smoking status: Current Every Day Smoker    Packs/day: 0.50    Years: 45.00    Pack years: 22.50    Types: Cigarettes  . Smokeless tobacco: Never Used  Vaping Use  . Vaping Use: Never used  Substance Use Topics  . Alcohol use: No    Alcohol/week: 0.0 standard drinks    Comment: Quit 12/06/2009  . Drug use: No    Home Medications Prior to Admission medications   Medication Sig Start Date End Date Taking? Authorizing Provider  acetaminophen (TYLENOL) 500 MG tablet Take 500-1,000 mg by mouth every 6 (six) hours as needed for moderate pain (pain).    Yes [provider]  Cholecalciferol (VITAMIN D3) 2000 units capsule Take 1 capsule (2,000 Units total) by mouth daily. 03/17/16   Yes Golden Circle, FNP  lamoTRIgine (LAMICTAL) 200 MG tablet Take 1 tablet (200 mg total) by mouth 2 (two) times daily. Patient taking differently: Take 200 mg by mouth 2 (two) times daily. 8am and 8 pm 07/25/19  Yes Lomax, Amy, NP  lamoTRIgine (LAMICTAL) 25 MG tablet TAKE 2 TABLETS EVERY DAY Patient taking differently: Take 50 mg by mouth at bedtime.  01/11/20  Yes Lomax, Amy, NP  Nutritional Supplements (ESTROVEN PO) Take 1 tablet by mouth daily.    Yes [provider]  omeprazole (PRILOSEC) 20 MG capsule Take 1 capsule (20 mg total) by mouth daily. 05/24/19  Yes Hawks, Christy A, FNP  Oxcarbazepine (TRILEPTAL) 300 MG tablet Take 1 tablet (300 mg total) by mouth 2 (two) times daily. 07/25/19  Yes Lomax, Amy, NP  dicyclomine (BENTYL)  20 MG tablet Take 1 tablet (20 mg total) by mouth 2 (two) times daily. 03/24/20   Jack Bolio, PA-C  ondansetron (ZOFRAN ODT) 4 MG disintegrating tablet Take 1 tablet (4 mg total) by mouth every 8 (eight) hours as needed for nausea or vomiting. 03/24/20   Desi Carby, PA-C  polyethylene glycol powder (GLYCOLAX/MIRALAX) 17 GM/SCOOP powder Take 255 g by mouth daily. Patient not taking: Reported on 03/24/2020 02/19/20   Mansouraty, Telford Nab., MD  sulfamethoxazole-trimethoprim (BACTRIM DS) 800-160 MG tablet Take 1 tablet by mouth 2 (two) times daily for 7 days. 03/24/20 03/31/20  Eustaquio Maize, PA-C    Allergies    Zonisamide, Aspirin, Ciprofloxacin, Codeine, Hydrocodone, Oxycodone, and Penicillins  Review of Systems   Review of Systems  Constitutional: Positive for chills. Negative for fever.  Gastrointestinal: Positive for abdominal pain, blood in stool and nausea. Negative for constipation, diarrhea and vomiting.  All other systems reviewed and are negative.   Physical Exam Updated Vital Signs BP (!) 190/110 (BP Location: Left Arm)   Pulse 84   Temp 97.7 F (36.5 C) (Oral)   Resp 16   LMP  (LMP Unknown)   SpO2 97%   Physical  Exam Vitals and nursing note reviewed.  Constitutional:      Appearance: She is not ill-appearing or diaphoretic.  HENT:     Head: Normocephalic and atraumatic.  Eyes:     Conjunctiva/sclera: Conjunctivae normal.  Cardiovascular:     Rate and Rhythm: Normal rate and regular rhythm.     Heart sounds: Normal heart sounds.  Pulmonary:     Effort: Pulmonary effort is normal.     Breath sounds: Normal breath sounds. No wheezing, rhonchi or rales.  Abdominal:     General: Abdomen is flat.     Palpations: Abdomen is soft.     Tenderness: There is abdominal tenderness in the epigastric area and left lower quadrant. There is guarding (voluntary). There is no right CVA tenderness, left CVA tenderness or rebound.  Musculoskeletal:     Cervical back: Neck supple.  Skin:    General: Skin is warm and dry.  Neurological:     Mental Status: She is alert.     ED Results / Procedures / Treatments   Labs (all labs ordered are listed, but only abnormal results are displayed) Labs Reviewed  COMPREHENSIVE METABOLIC PANEL - Abnormal; Notable for the following components:      Result Value   CO2 18 (*)    Glucose, Bld 152 (*)    Creatinine, Ser 1.01 (*)    Total Protein 8.2 (*)    Alkaline Phosphatase 136 (*)    All other components within normal limits  CBC - Abnormal; Notable for the following components:   WBC 21.1 (*)    RBC 5.68 (*)    Hemoglobin 17.3 (*)    HCT 51.5 (*)    All other components within normal limits  LACTIC ACID, PLASMA - Abnormal; Notable for the following components:   Lactic Acid, Venous 2.1 (*)    All other components within normal limits  CULTURE, BLOOD (ROUTINE X 2)  CULTURE, BLOOD (ROUTINE X 2)  LIPASE, BLOOD  URINALYSIS, ROUTINE W REFLEX MICROSCOPIC  LACTIC ACID, PLASMA    EKG None  Radiology CT Abdomen Pelvis W Contrast  Result Date: 03/24/2020 CLINICAL DATA:  Abdominal pain, rectal bleeding EXAM: CT ABDOMEN AND PELVIS WITH CONTRAST TECHNIQUE:  Multidetector CT imaging of the abdomen and pelvis was performed using the standard protocol  following bolus administration of intravenous contrast. CONTRAST:  154m OMNIPAQUE IOHEXOL 300 MG/ML  SOLN COMPARISON:  None. FINDINGS: Lower chest: Pacer wires noted in the right heart. No acute abnormality. Hepatobiliary: No focal hepatic abnormality. Gallbladder unremarkable. Pancreas: No focal abnormality or ductal dilatation. Spleen: No focal abnormality.  Normal size. Adrenals/Urinary Tract: No adrenal abnormality. No focal renal abnormality. No stones or hydronephrosis. Urinary bladder is unremarkable. Stomach/Bowel: There is wall thickening and surrounding stranding within the colon from the distal transverse colon through the descending colon into the sigmoid colon compatible with colitis. No bowel obstruction. Stomach and small bowel decompressed, unremarkable. Vascular/Lymphatic: Aortic atherosclerosis. No evidence of aneurysm or adenopathy. Reproductive: Uterus and adnexa unremarkable.  No mass. Other: No free fluid or free air. Musculoskeletal: No acute bony abnormality. IMPRESSION: Wall thickening and surrounding stranding involving the left colon compatible with infectious or inflammatory colitis. Aortic atherosclerosis. Electronically Signed   By: KRolm BaptiseM.D.   On: 03/24/2020 18:30    Procedures Procedures (including critical care time)  Medications Ordered in ED Medications  sodium chloride (PF) 0.9 % injection (  Not Given 03/24/20 1925)  sodium chloride 0.9 % bolus 1,000 mL (0 mLs Intravenous Stopped 03/24/20 1926)  ondansetron (ZOFRAN) injection 4 mg (4 mg Intravenous Given 03/24/20 1743)  morphine 4 MG/ML injection 4 mg (4 mg Intravenous Given 03/24/20 1739)  iohexol (OMNIPAQUE) 300 MG/ML solution 100 mL (100 mLs Intravenous Contrast Given 03/24/20 1805)  sulfamethoxazole-trimethoprim (BACTRIM DS) 800-160 MG per tablet 1 tablet (1 tablet Oral Given 03/24/20 1936)  sodium chloride 0.9  % bolus 1,000 mL (1,000 mLs Intravenous New Bag/Given 03/24/20 1936)    ED Course  I have reviewed the triage vital signs and the nursing notes.  Pertinent labs & imaging results that were available during my care of the patient were reviewed by me and considered in my medical decision making (see chart for details).  Clinical Course as of Mar 24 2025  SNancy FetterOct 17, 2021  1717 WBC(!): 21.1 [MV]  1836 Discussed case with ED Pharmacist SNicki Reaperwho recommends 7 days of Bactrim DS BID given pt's allergies   [MV]  1920 Lactic Acid, Venous(!!): 2.1 [MV]    Clinical Course User Index [MV] VEustaquio Maize PA-C   MDM Rules/Calculators/A&P                          59year old female presents to the ED today complaining of sudden onset diffuse abdominal pain that began this morning with associated nausea, bright red blood in stool, chills.  On arrival to the ED patient is afebrile, nontachycardic and nontachypneic.  Appears to be no acute distress. BP is noted to be elevated; pt reports PCP is keeping an eye on it currently. May be elevated s/2 pain - will continue to monitor. Lab work was obtained while patient was in the waiting room with a white blood cell count of 21,000.  Hemoglobin and hematocrit 17.3/51.5 which does appear hemoconcentrated.  CMP with a glucose 151, bicarb 18, no gap.  Creatinine mildly elevated 1.01.  Alk phos 136 however this does appear to be around patient's baseline.  Lipase within normal limits at 23.  On exam patient has diffuse abdominal tenderness however it does appear worse in the epigastrium and left lower quadrant.  She does have a history of diverticulitis however states this feels different.  She denies any fevers at home.  In the setting of leukocytosis will obtain blood cultures at  this time as well as lactic acid.  Unfortunately patient has a allergy listed to both penicillins and fluoroquinolones.  It appears Cipro has given her seizures in the past, patient does have  a history of seizure disorder and is on Lamictal.  Appears that she got hives and swelling secondary to penicillin in the 1990s.  There is no med history to suggest the patient has tolerated cephalosporins in the past and she is unsure.  I discussed case with our ED pharmacist Nicki Reaper who says her next options IV would be an ertapenem versus outpatient possibly Bactrim.  Patient is nonseptic appearing today and vitals are stable.  We will plan to hold off on antibiotics at this time prior to obtaining CT scan to narrow down antibiotic choice to specific infection.   CT scan: IMPRESSION:  Wall thickening and surrounding stranding involving the left colon  compatible with infectious or inflammatory colitis.    Aortic atherosclerosis.   Re-discussed with ED pharmacist who recommends Bactrim outpatient. Will provide oral dose in the ED today. Still awaiting LA at this time.   Lactic acid elevated at 2.1. This was prior to receiving fluids. Will give additional fluids and reevaluate.   At shift change case signed out to [facility], [place], who will dispo pending repeat lactic acid.   This note was prepared using Dragon voice recognition software and may include unintentional dictation errors due to the inherent limitations of voice recognition software.   Final Clinical Impression(s) / ED Diagnoses Final diagnoses:  Infectious colitis    Rx / DC Orders ED Discharge Orders         Ordered    sulfamethoxazole-trimethoprim (BACTRIM DS) 800-160 MG tablet  2 times daily        03/24/20 2000    ondansetron (ZOFRAN ODT) 4 MG disintegrating tablet  Every 8 hours PRN        03/24/20 2000    dicyclomine (BENTYL) 20 MG tablet  2 times daily        03/24/20 2000           Discharge Instructions     Please pick up antibiotics and take as prescribed for the next week to cover for your infection. Drink plenty of fluids to stay hydrated. I have also prescribed nausea medication as needed as well as  pain medication for abdominal cramping.   Follow up with your PCP regarding your ED visit and recheck next week.   Return to the ED IMMEDIATELY for any worsening symptoms including worsening pain, excessive vomiting, fevers > 100.4, or any other new/concerning symptoms       Eustaquio Maize, PA-C 03/24/20 2028    Lajean Saver, MD 03/28/20 [phone]

## 2020-03-24 NOTE — Discharge Instructions (Addendum)
Please pick up antibiotics and take as prescribed for the next week to cover for your infection. Drink plenty of fluids to stay hydrated. I have also prescribed nausea medication as needed as well as pain medication for abdominal cramping.   Follow up with your PCP regarding your ED visit and recheck next week.   Return to the ED IMMEDIATELY for any worsening symptoms including worsening pain, excessive vomiting, fevers > 100.4, or any other new/concerning symptoms

## 2020-03-24 NOTE — ED Triage Notes (Signed)
Pt arrived via walk in, c/o diffuse abd pain and cramping, diarrhea, states she believes she saw some dark blood in her stool this morning.

## 2020-03-25 NOTE — ED Provider Notes (Signed)
  [name] Maize, PA-C requested I follow-up on the patient's repeat lactic acid to assure improvement.  Initial lactic acid 2.1, repeat 1.5.  Improvement noted.  Patient discharged.   Lorayne Bender, PA-C 03/25/20 0027    Carmin Muskrat, MD 03/27/20 2001

## 2020-03-29 LAB — CULTURE, BLOOD (ROUTINE X 2)
Culture: NO GROWTH
Culture: NO GROWTH
Special Requests: ADEQUATE

## 2020-04-01 DIAGNOSIS — K921 Melena: Secondary | ICD-10-CM | POA: Diagnosis not present

## 2020-04-01 DIAGNOSIS — A09 Infectious gastroenteritis and colitis, unspecified: Secondary | ICD-10-CM | POA: Diagnosis not present

## 2020-04-01 DIAGNOSIS — D72829 Elevated white blood cell count, unspecified: Secondary | ICD-10-CM | POA: Diagnosis not present

## 2020-04-01 DIAGNOSIS — E782 Mixed hyperlipidemia: Secondary | ICD-10-CM | POA: Diagnosis not present

## 2020-04-04 ENCOUNTER — Ambulatory Visit (INDEPENDENT_AMBULATORY_CARE_PROVIDER_SITE_OTHER): Payer: Medicare HMO

## 2020-04-04 DIAGNOSIS — I442 Atrioventricular block, complete: Secondary | ICD-10-CM

## 2020-04-04 LAB — CUP PACEART REMOTE DEVICE CHECK
Battery Remaining Longevity: 76 mo
Battery Voltage: 3 V
Brady Statistic AP VP Percent: 0 %
Brady Statistic AP VS Percent: 1.61 %
Brady Statistic AS VP Percent: 0.07 %
Brady Statistic AS VS Percent: 98.32 %
Brady Statistic RA Percent Paced: 1.61 %
Brady Statistic RV Percent Paced: 0.07 %
Date Time Interrogation Session: 20211028084510
Implantable Lead Implant Date: 20160707
Implantable Lead Implant Date: 20160707
Implantable Lead Location: 753859
Implantable Lead Location: 753860
Implantable Lead Model: 5076
Implantable Lead Model: 5076
Implantable Pulse Generator Implant Date: 20160707
Lead Channel Impedance Value: 361 Ohm
Lead Channel Impedance Value: 380 Ohm
Lead Channel Impedance Value: 703 Ohm
Lead Channel Impedance Value: 741 Ohm
Lead Channel Pacing Threshold Amplitude: 0.5 V
Lead Channel Pacing Threshold Amplitude: 0.5 V
Lead Channel Pacing Threshold Pulse Width: 0.4 ms
Lead Channel Pacing Threshold Pulse Width: 0.4 ms
Lead Channel Sensing Intrinsic Amplitude: 30.125 mV
Lead Channel Sensing Intrinsic Amplitude: 30.125 mV
Lead Channel Sensing Intrinsic Amplitude: 4.25 mV
Lead Channel Sensing Intrinsic Amplitude: 4.25 mV
Lead Channel Setting Pacing Amplitude: 1.5 V
Lead Channel Setting Pacing Amplitude: 2.5 V
Lead Channel Setting Pacing Pulse Width: 0.4 ms
Lead Channel Setting Sensing Sensitivity: 1.2 mV

## 2020-04-09 NOTE — Progress Notes (Signed)
Remote pacemaker transmission.   

## 2020-04-11 DIAGNOSIS — D225 Melanocytic nevi of trunk: Secondary | ICD-10-CM | POA: Diagnosis not present

## 2020-04-11 DIAGNOSIS — C4359 Malignant melanoma of other part of trunk: Secondary | ICD-10-CM | POA: Diagnosis not present

## 2020-04-11 DIAGNOSIS — Z85820 Personal history of malignant melanoma of skin: Secondary | ICD-10-CM | POA: Diagnosis not present

## 2020-05-14 DIAGNOSIS — Z4802 Encounter for removal of sutures: Secondary | ICD-10-CM | POA: Diagnosis not present

## 2020-05-21 ENCOUNTER — Other Ambulatory Visit: Payer: Medicare HMO

## 2020-05-21 ENCOUNTER — Other Ambulatory Visit: Payer: Self-pay

## 2020-05-21 DIAGNOSIS — Z20822 Contact with and (suspected) exposure to covid-19: Secondary | ICD-10-CM

## 2020-05-22 LAB — NOVEL CORONAVIRUS, NAA: SARS-CoV-2, NAA: NOT DETECTED

## 2020-05-22 LAB — SARS-COV-2, NAA 2 DAY TAT

## 2020-05-30 DIAGNOSIS — C4359 Malignant melanoma of other part of trunk: Secondary | ICD-10-CM | POA: Diagnosis not present

## 2020-05-30 DIAGNOSIS — Z85820 Personal history of malignant melanoma of skin: Secondary | ICD-10-CM | POA: Diagnosis not present

## 2020-05-30 DIAGNOSIS — D485 Neoplasm of uncertain behavior of skin: Secondary | ICD-10-CM | POA: Diagnosis not present

## 2020-06-08 ENCOUNTER — Encounter: Payer: Self-pay | Admitting: Family Medicine

## 2020-06-12 ENCOUNTER — Ambulatory Visit: Payer: Medicare HMO | Admitting: Family Medicine

## 2020-06-12 ENCOUNTER — Ambulatory Visit: Payer: Self-pay | Admitting: Family Medicine

## 2020-06-12 NOTE — Progress Notes (Deleted)
No chief complaint on file.    HISTORY OF PRESENT ILLNESS: Today 06/12/20  [name] [name] is a 60 y.o. female here today for follow up for seizures. She continues lamotrigine 200mg  in the morning and 250mg  in the evenings. She is also taking oxcarbamazepine 300mg  BID.    HISTORY (copied from previous note)  [name] [name] is a 60 y.o. female here today for follow up for seizure. She continues lamotrigine 200mg  in am and 250mg  in pm as well as oxcarbazepine 300mg  twice daily. She is tolerating medications well. No seizure activity since 05/2018. She is tolerating medications well. No significant changes in gait or balance. She is able to perform ADL's.    REVIEW OF SYSTEMS: Out of a complete 14 system review of symptoms, the patient complains only of the following symptoms, and all other reviewed systems are negative.   ALLERGIES: Allergies  Allergen Reactions  . Zonisamide Anxiety    Panic Attacks  . Aspirin     sensitivity  . Ciprofloxacin     Seizures & it keeps her awake  . Codeine Hives  . Hydrocodone Hives  . Oxycodone Hives  . Penicillins Hives and Swelling    Did it involve swelling of the face/tongue/throat, SOB, or low BP? Yes Did it involve sudden or severe rash/hives, skin peeling, or any reaction on the inside of your mouth or nose? Yes Did you need to seek medical attention at a hospital or doctor's office? No When did it last happen?1990's If all above answers are "NO", may proceed with cephalosporin use.      HOME MEDICATIONS: Outpatient Medications Prior to Visit  Medication Sig Dispense Refill  . acetaminophen (TYLENOL) 500 MG tablet Take 500-1,000 mg by mouth every 6 (six) hours as needed for moderate pain (pain).     . Cholecalciferol (VITAMIN D3) 2000 units capsule Take 1 capsule (2,000 Units total) by mouth daily. 90 capsule 0  . dicyclomine (BENTYL) 20 MG tablet Take 1 tablet (20 mg total) by mouth 2 (two) times daily. 20 tablet 0  .  lamoTRIgine (LAMICTAL) 200 MG tablet Take 1 tablet (200 mg total) by mouth 2 (two) times daily. (Patient taking differently: Take 200 mg by mouth 2 (two) times daily. 8am and 8 pm) 180 tablet 3  . lamoTRIgine (LAMICTAL) 25 MG tablet TAKE 2 TABLETS EVERY DAY (Patient taking differently: Take 50 mg by mouth at bedtime. ) 180 tablet 1  . Nutritional Supplements (ESTROVEN PO) Take 1 tablet by mouth daily.     Marland Kitchen omeprazole (PRILOSEC) 20 MG capsule Take 1 capsule (20 mg total) by mouth daily. 90 capsule 1  . ondansetron (ZOFRAN ODT) 4 MG disintegrating tablet Take 1 tablet (4 mg total) by mouth every 8 (eight) hours as needed for nausea or vomiting. 20 tablet 0  . Oxcarbazepine (TRILEPTAL) 300 MG tablet Take 1 tablet (300 mg total) by mouth 2 (two) times daily. 180 tablet 3  . polyethylene glycol powder (GLYCOLAX/MIRALAX) 17 GM/SCOOP powder Take 255 g by mouth daily. (Patient not taking: Reported on 03/24/2020) 255 g 3   No facility-administered medications prior to visit.     PAST MEDICAL HISTORY: Past Medical History:  Diagnosis Date  . Arthritis   . Chronic insomnia 03/07/2015  . Diverticulitis   . GERD (gastroesophageal reflux disease)   . Gout   . Hiatal hernia   . LBBB (left bundle branch block)   . Pacemaker    [employer]   . Panic disorder  09/07/2014  . Pneumonia    x 1 - Walking  . Seizures ([place])    Last Seizure in 2017 - controlled with meds   . Sleep apnea    does not use a CPAP  . Wrist tendonitis    R      PAST SURGICAL HISTORY: Past Surgical History:  Procedure Laterality Date  . COLONOSCOPY  last 03/31/2016  . COLONOSCOPY WITH PROPOFOL N/A 04/04/2019   Procedure: COLONOSCOPY WITH PROPOFOL;  Surgeon: Mauri Pole, MD;  Location: [facility];  Service: Endoscopy;  Laterality: N/A;  . COLONOSCOPY WITH PROPOFOL N/A 06/15/2019   Procedure: COLONOSCOPY WITH PROPOFOL;  Surgeon: Rush Landmark Telford Nab., MD;  Location: [place];  Service: Gastroenterology;   Laterality: N/A;  . COLONOSCOPY WITH PROPOFOL N/A 02/28/2020   Procedure: COLONOSCOPY WITH PROPOFOL;  Surgeon: Rush Landmark Telford Nab., MD;  Location: [facility];  Service: Gastroenterology;  Laterality: N/A;  . ENDOROTOR  02/28/2020   Procedure: [id number];  Surgeon: Mansouraty, Telford Nab., MD;  Location: [facility];  Service: Gastroenterology;;  . ENDOSCOPIC MUCOSAL RESECTION N/A 06/15/2019   Procedure: ENDOSCOPIC MUCOSAL RESECTION;  Surgeon: Irving Copas., MD;  Location: [place];  Service: Gastroenterology;  Laterality: N/A;  . ENDOSCOPIC MUCOSAL RESECTION N/A 02/28/2020   Procedure: ENDOSCOPIC MUCOSAL RESECTION;  Surgeon: Rush Landmark Telford Nab., MD;  Location: [facility];  Service: Gastroenterology;  Laterality: N/A;  . EP IMPLANTABLE DEVICE N/A 12/13/2014   Procedure: Pacemaker Implant;  Surgeon: Deboraha Sprang, MD;  Location: [place] [facility];  Service: Cardiovascular;  Laterality: N/A;  . HAND SURGERY    . HEMOSTASIS CLIP PLACEMENT  06/15/2019   Procedure: HEMOSTASIS CLIP PLACEMENT;  Surgeon: Irving Copas., MD;  Location: [place];  Service: Gastroenterology;;  . HEMOSTASIS CLIP PLACEMENT  02/28/2020   Procedure: HEMOSTASIS CLIP PLACEMENT;  Surgeon: Irving Copas., MD;  Location: [facility];  Service: Gastroenterology;;  . Laretta Alstrom CONTROL  02/28/2020   Procedure: HEMOSTASIS CONTROL;  Surgeon: Irving Copas., MD;  Location: [facility];  Service: Gastroenterology;;  . POLYPECTOMY    . POLYPECTOMY  04/04/2019   Procedure: POLYPECTOMY;  Surgeon: Mauri Pole, MD;  Location: [facility];  Service: Endoscopy;;  . POLYPECTOMY  06/15/2019   Procedure: POLYPECTOMY;  Surgeon: Irving Copas., MD;  Location: [place];  Service: Gastroenterology;;  . Lia Foyer LIFTING INJECTION  06/15/2019   Procedure: SUBMUCOSAL LIFTING INJECTION;  Surgeon: Irving Copas., MD;  Location: [place];  Service: Gastroenterology;;  .  TUBAL LIGATION    . UPPER GI ENDOSCOPY    . WISDOM TOOTH EXTRACTION       FAMILY HISTORY: Family History  Problem Relation Age of Onset  . Alzheimer's disease Mother   . Hypertension Mother   . Colon polyps Mother   . Hypertension Father   . Stroke Father   . Leukemia Father   . Prostate cancer Father   . Colon polyps Sister   . Stroke Brother   . Hypertension Brother   . Sleep apnea Brother   . Colon polyps Brother   . Kidney failure Sister   . Diabetes Sister   . Colon polyps Sister   . Coronary artery disease Maternal Grandfather   . Parkinson's disease Maternal Grandmother   . Colon cancer Paternal Aunt   . Esophageal cancer Neg Hx   . Stomach cancer Neg Hx   . Rectal cancer Neg Hx   . Inflammatory bowel disease Neg Hx   . Liver disease Neg Hx   .  Pancreatic cancer Neg Hx      SOCIAL HISTORY: Social History   Socioeconomic History  . Marital status: Single    Spouse name: Not on file  . Number of children: 0  . Years of education: 12  . Highest education level: Not on file  Occupational History  . Not on file  Tobacco Use  . Smoking status: Current Every Day Smoker    Packs/day: 0.50    Years: 45.00    Pack years: 22.50    Types: Cigarettes  . Smokeless tobacco: Never Used  Vaping Use  . Vaping Use: Never used  Substance and Sexual Activity  . Alcohol use: No    Alcohol/week: 0.0 standard drinks    Comment: Quit 12/06/2009  . Drug use: No  . Sexual activity: Not on file  Other Topics Concern  . Not on file  Social History Narrative   Patient drinks 24 oz of soda daily.   Lives with sister   Some college   Left handed   Social Determinants of Health   Financial Resource Strain: Not on file  Food Insecurity: Not on file  Transportation Needs: Not on file  Physical Activity: Not on file  Stress: Not on file  Social Connections: Not on file  Intimate Partner Violence: Not on file      PHYSICAL EXAM  There were no vitals filed for  this visit. There is no height or weight on file to calculate BMI.   Generalized: Well developed, in no acute distress  Cardiology: normal rate and rhythm, no murmur auscultated  Respiratory: clear to auscultation bilaterally    Neurological examination  Mentation: Alert oriented to time, place, history taking. Follows all commands speech and language fluent Cranial nerve II-XII: Pupils were equal round reactive to light. Extraocular movements were full, visual field were full on confrontational test. Facial sensation and strength were normal. Uvula tongue midline. Head turning and shoulder shrug  were normal and symmetric. Motor: The motor testing reveals 5 over 5 strength of all 4 extremities. Good symmetric motor tone is noted throughout.  Sensory: Sensory testing is intact to soft touch on all 4 extremities. No evidence of extinction is noted.  Coordination: Cerebellar testing reveals good finger-nose-finger and heel-to-shin bilaterally.  Gait and station: Gait is normal. Tandem gait is normal. Romberg is negative. No drift is seen.  Reflexes: Deep tendon reflexes are symmetric and normal bilaterally.     DIAGNOSTIC DATA (LABS, IMAGING, TESTING) - I reviewed patient records, labs, notes, testing and imaging myself where available.  Lab Results  Component Value Date   WBC 21.1 (H) 03/24/2020   HGB 17.3 (H) 03/24/2020   HCT 51.5 (H) 03/24/2020   MCV 90.7 03/24/2020   PLT 271 03/24/2020      Component Value Date/Time   NA 137 03/24/2020 1506   NA 141 12/07/2018 1417   K 4.1 03/24/2020 1506   CL 105 03/24/2020 1506   CO2 18 (L) 03/24/2020 1506   GLUCOSE 152 (H) 03/24/2020 1506   BUN 12 03/24/2020 1506   BUN 7 12/07/2018 1417   CREATININE 1.01 (H) 03/24/2020 1506   CALCIUM 9.4 03/24/2020 1506   PROT 8.2 (H) 03/24/2020 1506   PROT 6.8 12/07/2018 1417   ALBUMIN 4.8 03/24/2020 1506   ALBUMIN 4.3 12/07/2018 1417   AST 21 03/24/2020 1506   ALT 17 03/24/2020 1506   ALKPHOS  136 (H) 03/24/2020 1506   BILITOT 0.5 03/24/2020 1506   BILITOT 0.3 12/07/2018 1417  GFRNONAA >60 03/24/2020 1506   GFRAA 76 12/07/2018 1417   Lab Results  Component Value Date   CHOL 257 (H) 03/02/2016   HDL 41.90 03/02/2016   LDLDIRECT 199.0 03/02/2016   TRIG 205.0 (H) 03/02/2016   CHOLHDL 6 03/02/2016   No results found for: HGBA1C Lab Results  Component Value Date   VITAMINB12 177 (L) 04/11/2015   Lab Results  Component Value Date   TSH 1.399 12/12/2014    MMSE - Mini Mental State Exam 05/04/2016  Orientation to time 4  Orientation to Place 5  Registration 3  Attention/ Calculation 3  Recall 3  Language- name 2 objects 2  Language- repeat 1  Language- follow 3 step command 2  Language- read & follow direction 1  Write a sentence 1  Copy design 1  Total score 26     ASSESSMENT AND PLAN  60 y.o. year old female  has a past medical history of Arthritis, Chronic insomnia (03/07/2015), Diverticulitis, GERD (gastroesophageal reflux disease), Gout, Hiatal hernia, LBBB (left bundle branch block), Pacemaker, Panic disorder (09/07/2014), Pneumonia, Seizures ([facility]), Sleep apnea, and Wrist tendonitis. here with   No diagnosis found.    No orders of the defined types were placed in this encounter.     I spent 20 minutes of face-to-face and non-face-to-face time with patient.  This included previsit chart review, lab review, study review, order entry, electronic health record documentation, patient education.    Shawnie Dapper, MSN, FNP-C 06/12/2020, 7:36 AM  [facility] [address], [address] [place], [place] [address] [phone]

## 2020-06-12 NOTE — Patient Instructions (Incomplete)
Below is our plan:  We will ***  Please make sure you are staying well hydrated. I recommend 50-60 ounces daily. Well balanced diet and regular exercise encouraged.    Please continue follow up with care team as directed.   Follow up with *** in ***  You may receive a survey regarding today's visit. I encourage you to leave honest feed back as I do use this information to improve patient care. Thank you for seeing me today!      Seizure, Adult A seizure is a sudden burst of abnormal electrical activity in the brain. Seizures usually last from 30 seconds to 2 minutes. They can cause many different symptoms. Usually, seizures are not harmful unless they last a long time. What are the causes? Common causes of this condition include:  Fever or infection.  Conditions that affect the brain, such as: ? A brain abnormality that you were born with. ? A brain or head injury. ? Bleeding in the brain. ? A tumor. ? Stroke. ? Brain disorders such as autism or cerebral palsy.  Low blood sugar.  Conditions that are passed from parent to child (are inherited).  Problems with substances, such as: ? Having a reaction to a drug or a medicine. ? Suddenly stopping the use of a substance (withdrawal). In some cases, the cause may not be known. A person who has repeated seizures over time without a clear cause has a condition called epilepsy. What increases the risk? You are more likely to get this condition if you have:  A family history of epilepsy.  Had a seizure in the past.  A brain disorder.  A history of head injury, lack of oxygen at birth, or strokes. What are the signs or symptoms? There are many types of seizures. The symptoms vary depending on the type of seizure you have. Examples of symptoms during a seizure include:  Shaking (convulsions).  Stiffness in the body.  Passing out (losing consciousness).  Head nodding.  Staring.  Not responding to sound or  touch.  Loss of bladder control and bowel control. Some people have symptoms right before and right after a seizure happens. Symptoms before a seizure may include:  Fear.  Worry (anxiety).  Feeling like you may vomit (nauseous).  Feeling like the room is spinning (vertigo).  Feeling like you saw or heard something before (dj vu).  Odd tastes or smells.  Changes in how you see. You may see flashing lights or spots. Symptoms after a seizure happens can include:  Confusion.  Sleepiness.  Headache.  Weakness on one side of the body. How is this treated? Most seizures will stop on their own in under 5 minutes. In these cases, no treatment is needed. Seizures that last longer than 5 minutes will usually need treatment. Treatment can include:  Medicines given through an IV tube.  Avoiding things that are known to cause your seizures. These can include medicines that you take for another condition.  Medicines to treat epilepsy.  Surgery to stop the seizures. This may be needed if medicines do not help. Follow these instructions at home: Medicines  Take over-the-counter and prescription medicines only as told by your doctor.  Do not eat or drink anything that may keep your medicine from working, such as alcohol. Activity  Do not do any activities that would be dangerous if you had another seizure, like driving or swimming. Wait until your doctor says it is safe for you to do them.  If  you live in the [place] ask your local DMV (department of motor vehicles) when you can drive.  Get plenty of rest. Teaching others Teach friends and family what to do when you have a seizure. They should:  Lay you on the ground.  Protect your head and body.  Loosen any tight clothing around your neck.  Turn you on your side.  Not hold you down.  Not put anything into your mouth.  Know whether or not you need emergency care.  Stay with you until you are better.  General  instructions  Contact your doctor each time you have a seizure.  Avoid anything that gives you seizures.  Keep a seizure diary. Write down: ? What you think caused each seizure. ? What you remember about each seizure.  Keep all follow-up visits as told by your doctor. This is important. Contact a doctor if:  You have another seizure.  You have seizures more often.  There is any change in what happens during your seizures.  You keep having seizures with treatment.  You have symptoms of being sick or having an infection. Get help right away if:  You have a seizure that: ? Lasts longer than 5 minutes. ? Is different than seizures you had before. ? Makes it harder to breathe. ? Happens after you hurt your head.  You have any of these symptoms after a seizure: ? Not being able to speak. ? Not being able to use a part of your body. ? Confusion. ? A bad headache.  You have two or more seizures in a row.  You do not wake up right after a seizure.  You get hurt during a seizure. These symptoms may be an emergency. Do not wait to see if the symptoms will go away. Get medical help right away. Call your local emergency services (911 in the [place] Do not drive yourself to the hospital. Summary  Seizures usually last from 30 seconds to 2 minutes. Usually, they are not harmful unless they last a long time.  Do not eat or drink anything that may keep your medicine from working, such as alcohol.  Teach friends and family what to do when you have a seizure.  Contact your doctor each time you have a seizure. This information is not intended to replace advice given to you by your health care provider. Make sure you discuss any questions you have with your health care provider. Document Revised: 08/12/2018 Document Reviewed: 08/12/2018 Elsevier Patient Education  2020 [employer].

## 2020-06-19 ENCOUNTER — Inpatient Hospital Stay (HOSPITAL_COMMUNITY)
Admission: EM | Admit: 2020-06-19 | Discharge: 2020-06-26 | DRG: 871 | Disposition: A | Discharge status: Home Health | Payer: Medicare HMO | Attending: Internal Medicine | Admitting: Internal Medicine

## 2020-06-19 ENCOUNTER — Emergency Department (HOSPITAL_COMMUNITY): Payer: Medicare HMO

## 2020-06-19 ENCOUNTER — Other Ambulatory Visit: Payer: Self-pay

## 2020-06-19 ENCOUNTER — Inpatient Hospital Stay (HOSPITAL_COMMUNITY): Payer: Medicare HMO

## 2020-06-19 DIAGNOSIS — I442 Atrioventricular block, complete: Secondary | ICD-10-CM | POA: Diagnosis present

## 2020-06-19 DIAGNOSIS — I272 Pulmonary hypertension, unspecified: Secondary | ICD-10-CM | POA: Diagnosis present

## 2020-06-19 DIAGNOSIS — R569 Unspecified convulsions: Secondary | ICD-10-CM | POA: Diagnosis not present

## 2020-06-19 DIAGNOSIS — I5043 Acute on chronic combined systolic (congestive) and diastolic (congestive) heart failure: Secondary | ICD-10-CM | POA: Diagnosis present

## 2020-06-19 DIAGNOSIS — Z881 Allergy status to other antibiotic agents status: Secondary | ICD-10-CM

## 2020-06-19 DIAGNOSIS — G40909 Epilepsy, unspecified, not intractable, without status epilepticus: Secondary | ICD-10-CM | POA: Diagnosis present

## 2020-06-19 DIAGNOSIS — T17800S Unspecified foreign body in other parts of respiratory tract causing asphyxiation, sequela: Secondary | ICD-10-CM

## 2020-06-19 DIAGNOSIS — G928 Other toxic encephalopathy: Secondary | ICD-10-CM | POA: Diagnosis not present

## 2020-06-19 DIAGNOSIS — Z880 Allergy status to penicillin: Secondary | ICD-10-CM | POA: Diagnosis not present

## 2020-06-19 DIAGNOSIS — N179 Acute kidney failure, unspecified: Secondary | ICD-10-CM | POA: Diagnosis present

## 2020-06-19 DIAGNOSIS — Z885 Allergy status to narcotic agent status: Secondary | ICD-10-CM | POA: Diagnosis not present

## 2020-06-19 DIAGNOSIS — R6520 Severe sepsis without septic shock: Secondary | ICD-10-CM | POA: Diagnosis present

## 2020-06-19 DIAGNOSIS — J9600 Acute respiratory failure, unspecified whether with hypoxia or hypercapnia: Secondary | ICD-10-CM | POA: Diagnosis not present

## 2020-06-19 DIAGNOSIS — I420 Dilated cardiomyopathy: Secondary | ICD-10-CM | POA: Diagnosis not present

## 2020-06-19 DIAGNOSIS — I071 Rheumatic tricuspid insufficiency: Secondary | ICD-10-CM | POA: Diagnosis present

## 2020-06-19 DIAGNOSIS — I5020 Unspecified systolic (congestive) heart failure: Secondary | ICD-10-CM

## 2020-06-19 DIAGNOSIS — Z20822 Contact with and (suspected) exposure to covid-19: Secondary | ICD-10-CM | POA: Diagnosis not present

## 2020-06-19 DIAGNOSIS — R0902 Hypoxemia: Secondary | ICD-10-CM | POA: Diagnosis not present

## 2020-06-19 DIAGNOSIS — R4182 Altered mental status, unspecified: Secondary | ICD-10-CM | POA: Diagnosis not present

## 2020-06-19 DIAGNOSIS — J9601 Acute respiratory failure with hypoxia: Secondary | ICD-10-CM | POA: Diagnosis not present

## 2020-06-19 DIAGNOSIS — E873 Alkalosis: Secondary | ICD-10-CM | POA: Diagnosis not present

## 2020-06-19 DIAGNOSIS — J690 Pneumonitis due to inhalation of food and vomit: Secondary | ICD-10-CM | POA: Diagnosis not present

## 2020-06-19 DIAGNOSIS — Z886 Allergy status to analgesic agent status: Secondary | ICD-10-CM

## 2020-06-19 DIAGNOSIS — C439 Malignant melanoma of skin, unspecified: Secondary | ICD-10-CM | POA: Diagnosis present

## 2020-06-19 DIAGNOSIS — Z23 Encounter for immunization: Secondary | ICD-10-CM

## 2020-06-19 DIAGNOSIS — J9602 Acute respiratory failure with hypercapnia: Secondary | ICD-10-CM | POA: Diagnosis not present

## 2020-06-19 DIAGNOSIS — R918 Other nonspecific abnormal finding of lung field: Secondary | ICD-10-CM | POA: Diagnosis not present

## 2020-06-19 DIAGNOSIS — A419 Sepsis, unspecified organism: Secondary | ICD-10-CM | POA: Diagnosis not present

## 2020-06-19 DIAGNOSIS — R1110 Vomiting, unspecified: Secondary | ICD-10-CM

## 2020-06-19 DIAGNOSIS — I5042 Chronic combined systolic (congestive) and diastolic (congestive) heart failure: Secondary | ICD-10-CM | POA: Diagnosis not present

## 2020-06-19 DIAGNOSIS — R9431 Abnormal electrocardiogram [ECG] [EKG]: Secondary | ICD-10-CM | POA: Diagnosis not present

## 2020-06-19 DIAGNOSIS — Z950 Presence of cardiac pacemaker: Secondary | ICD-10-CM | POA: Diagnosis not present

## 2020-06-19 DIAGNOSIS — I447 Left bundle-branch block, unspecified: Secondary | ICD-10-CM | POA: Diagnosis present

## 2020-06-19 DIAGNOSIS — Z978 Presence of other specified devices: Secondary | ICD-10-CM

## 2020-06-19 DIAGNOSIS — R4020 Unspecified coma: Secondary | ICD-10-CM | POA: Diagnosis not present

## 2020-06-19 DIAGNOSIS — F17210 Nicotine dependence, cigarettes, uncomplicated: Secondary | ICD-10-CM | POA: Diagnosis present

## 2020-06-19 DIAGNOSIS — I361 Nonrheumatic tricuspid (valve) insufficiency: Secondary | ICD-10-CM | POA: Diagnosis not present

## 2020-06-19 DIAGNOSIS — J90 Pleural effusion, not elsewhere classified: Secondary | ICD-10-CM | POA: Diagnosis not present

## 2020-06-19 DIAGNOSIS — Z4682 Encounter for fitting and adjustment of non-vascular catheter: Secondary | ICD-10-CM | POA: Diagnosis not present

## 2020-06-19 DIAGNOSIS — I110 Hypertensive heart disease with heart failure: Secondary | ICD-10-CM | POA: Diagnosis present

## 2020-06-19 DIAGNOSIS — J9801 Acute bronchospasm: Secondary | ICD-10-CM | POA: Diagnosis not present

## 2020-06-19 DIAGNOSIS — Z6833 Body mass index (BMI) 33.0-33.9, adult: Secondary | ICD-10-CM

## 2020-06-19 DIAGNOSIS — R0603 Acute respiratory distress: Secondary | ICD-10-CM | POA: Diagnosis present

## 2020-06-19 DIAGNOSIS — R404 Transient alteration of awareness: Secondary | ICD-10-CM | POA: Diagnosis not present

## 2020-06-19 LAB — I-STAT ARTERIAL BLOOD GAS, ED
Acid-base deficit: 9 mmol/L — ABNORMAL HIGH (ref 0.0–2.0)
Acid-base deficit: 6 mmol/L — ABNORMAL HIGH (ref 0.0–2.0)
Bicarbonate: 21.8 mmol/L (ref 20.0–28.0)
Bicarbonate: 21.7 mmol/L (ref 20.0–28.0)
Calcium, Ion: 1.2 mmol/L (ref 1.15–1.40)
Calcium, Ion: 1.17 mmol/L (ref 1.15–1.40)
HCT: 42 % (ref 36.0–46.0)
HCT: 35 % — ABNORMAL LOW (ref 36.0–46.0)
Hemoglobin: 14.3 g/dL (ref 12.0–15.0)
Hemoglobin: 11.9 g/dL — ABNORMAL LOW (ref 12.0–15.0)
O2 Saturation: 93 %
O2 Saturation: 99 %
Patient temperature: 99.6
Patient temperature: 97.7
Potassium: 3 mmol/L — ABNORMAL LOW (ref 3.5–5.1)
Potassium: 3.7 mmol/L (ref 3.5–5.1)
Sodium: 142 mmol/L (ref 135–145)
Sodium: 140 mmol/L (ref 135–145)
TCO2: 24 mmol/L (ref 22–32)
TCO2: 23 mmol/L (ref 22–32)
pCO2 arterial: 71.3 mmHg (ref 32.0–48.0)
pCO2 arterial: 48.3 mmHg — ABNORMAL HIGH (ref 32.0–48.0)
pH, Arterial: 7.097 — CL (ref 7.350–7.450)
pH, Arterial: 7.257 — ABNORMAL LOW (ref 7.350–7.450)
pO2, Arterial: 94 mmHg (ref 83.0–108.0)
pO2, Arterial: 148 mmHg — ABNORMAL HIGH (ref 83.0–108.0)

## 2020-06-19 LAB — BASIC METABOLIC PANEL
Anion gap: 12 (ref 5–15)
Anion gap: 13 (ref 5–15)
Anion gap: 13 (ref 5–15)
BUN: 7 mg/dL (ref 6–20)
BUN: 8 mg/dL (ref 6–20)
BUN: 10 mg/dL (ref 6–20)
CO2: 19 mmol/L — ABNORMAL LOW (ref 22–32)
CO2: 19 mmol/L — ABNORMAL LOW (ref 22–32)
CO2: 16 mmol/L — ABNORMAL LOW (ref 22–32)
Calcium: 7.9 mg/dL — ABNORMAL LOW (ref 8.9–10.3)
Calcium: 7.7 mg/dL — ABNORMAL LOW (ref 8.9–10.3)
Calcium: 7.9 mg/dL — ABNORMAL LOW (ref 8.9–10.3)
Chloride: 111 mmol/L (ref 98–111)
Chloride: 108 mmol/L (ref 98–111)
Chloride: 110 mmol/L (ref 98–111)
Creatinine, Ser: 1.05 mg/dL — ABNORMAL HIGH (ref 0.44–1.00)
Creatinine, Ser: 1.23 mg/dL — ABNORMAL HIGH (ref 0.44–1.00)
Creatinine, Ser: 1.41 mg/dL — ABNORMAL HIGH (ref 0.44–1.00)
GFR, Estimated: >60 mL/min (ref >60)
GFR, Estimated: 50 mL/min — ABNORMAL LOW (ref >60)
GFR, Estimated: 43 mL/min — ABNORMAL LOW (ref >60)
Glucose, Bld: 132 mg/dL — ABNORMAL HIGH (ref 70–99)
Glucose, Bld: 113 mg/dL — ABNORMAL HIGH (ref 70–99)
Glucose, Bld: 119 mg/dL — ABNORMAL HIGH (ref 70–99)
Potassium: 3.3 mmol/L — ABNORMAL LOW (ref 3.5–5.1)
Potassium: 4.8 mmol/L (ref 3.5–5.1)
Potassium: 4.5 mmol/L (ref 3.5–5.1)
Sodium: 142 mmol/L (ref 135–145)
Sodium: 140 mmol/L (ref 135–145)
Sodium: 139 mmol/L (ref 135–145)

## 2020-06-19 LAB — CBC
HCT: 33.8 % — ABNORMAL LOW (ref 36.0–46.0)
Hemoglobin: 11 g/dL — ABNORMAL LOW (ref 12.0–15.0)
MCH: 31.3 pg (ref 26.0–34.0)
MCHC: 32.5 g/dL (ref 30.0–36.0)
MCV: 96 fL (ref 80.0–100.0)
Platelets: 166 10*3/uL (ref 150–400)
RBC: 3.52 MIL/uL — ABNORMAL LOW (ref 3.87–5.11)
RDW: 13.9 % (ref 11.5–15.5)
WBC: 9.5 10*3/uL (ref 4.0–10.5)
nRBC: 1.8 % — ABNORMAL HIGH (ref 0.0–0.2)

## 2020-06-19 LAB — CBC WITH DIFFERENTIAL/PLATELET
Abs Immature Granulocytes: 0 10*3/uL (ref 0.00–0.07)
Basophils Absolute: 0 10*3/uL (ref 0.0–0.1)
Basophils Relative: 0 %
Eosinophils Absolute: 0.2 10*3/uL (ref 0.0–0.5)
Eosinophils Relative: 1 %
HCT: 49.8 % — ABNORMAL HIGH (ref 36.0–46.0)
Hemoglobin: 15 g/dL (ref 12.0–15.0)
Lymphocytes Relative: 68 %
Lymphs Abs: 12.6 10*3/uL — ABNORMAL HIGH (ref 0.7–4.0)
MCH: 30.3 pg (ref 26.0–34.0)
MCHC: 30.1 g/dL (ref 30.0–36.0)
MCV: 100.6 fL — ABNORMAL HIGH (ref 80.0–100.0)
Monocytes Absolute: 0.6 10*3/uL (ref 0.1–1.0)
Monocytes Relative: 3 %
Neutro Abs: 5.2 10*3/uL (ref 1.7–7.7)
Neutrophils Relative %: 28 %
Platelets: 266 10*3/uL (ref 150–400)
RBC: 4.95 MIL/uL (ref 3.87–5.11)
RDW: 12.9 % (ref 11.5–15.5)
WBC: 18.6 10*3/uL — ABNORMAL HIGH (ref 4.0–10.5)
nRBC: 0 % (ref 0.0–0.2)
nRBC: 0 /100 WBC

## 2020-06-19 LAB — RAPID URINE DRUG SCREEN, HOSP PERFORMED
Amphetamines: NOT DETECTED
Barbiturates: NOT DETECTED
Benzodiazepines: POSITIVE — AB
Cocaine: NOT DETECTED
Opiates: NOT DETECTED
Tetrahydrocannabinol: NOT DETECTED

## 2020-06-19 LAB — URINALYSIS, ROUTINE W REFLEX MICROSCOPIC
Bacteria, UA: NONE SEEN
Bilirubin Urine: NEGATIVE
Glucose, UA: 150 mg/dL — AB
Ketones, ur: NEGATIVE mg/dL
Leukocytes,Ua: NEGATIVE
Nitrite: NEGATIVE
Protein, ur: 100 mg/dL — AB
Specific Gravity, Urine: 1.008 (ref 1.005–1.030)
pH: 8 (ref 5.0–8.0)

## 2020-06-19 LAB — COMPREHENSIVE METABOLIC PANEL
ALT: 18 U/L (ref 0–44)
AST: 28 U/L (ref 15–41)
Albumin: 3.5 g/dL (ref 3.5–5.0)
Alkaline Phosphatase: 153 U/L — ABNORMAL HIGH (ref 38–126)
Anion gap: 20 — ABNORMAL HIGH (ref 5–15)
BUN: 8 mg/dL (ref 6–20)
CO2: 15 mmol/L — ABNORMAL LOW (ref 22–32)
Calcium: 8.7 mg/dL — ABNORMAL LOW (ref 8.9–10.3)
Chloride: 105 mmol/L (ref 98–111)
Creatinine, Ser: 1.35 mg/dL — ABNORMAL HIGH (ref 0.44–1.00)
GFR, Estimated: 45 mL/min — ABNORMAL LOW (ref >60)
Glucose, Bld: 350 mg/dL — ABNORMAL HIGH (ref 70–99)
Potassium: 3.7 mmol/L (ref 3.5–5.1)
Sodium: 140 mmol/L (ref 135–145)
Total Bilirubin: 0.1 mg/dL — ABNORMAL LOW (ref 0.3–1.2)
Total Protein: 6.7 g/dL (ref 6.5–8.1)

## 2020-06-19 LAB — HEMOGLOBIN A1C
Hgb A1c MFr Bld: 4.9 % (ref 4.8–5.6)
Mean Plasma Glucose: 93.93 mg/dL

## 2020-06-19 LAB — PHENYTOIN LEVEL, TOTAL: Phenytoin Lvl: <2.5 ug/mL — ABNORMAL LOW (ref 10.0–20.0)

## 2020-06-19 LAB — PHOSPHORUS: Phosphorus: 4.3 mg/dL (ref 2.5–4.6)

## 2020-06-19 LAB — TROPONIN I (HIGH SENSITIVITY)
Troponin I (High Sensitivity): 24 ng/L — ABNORMAL HIGH (ref <18)
Troponin I (High Sensitivity): 87 ng/L — ABNORMAL HIGH (ref <18)

## 2020-06-19 LAB — GLUCOSE, CAPILLARY
Glucose-Capillary: 117 mg/dL — ABNORMAL HIGH (ref 70–99)
Glucose-Capillary: 121 mg/dL — ABNORMAL HIGH (ref 70–99)
Glucose-Capillary: 102 mg/dL — ABNORMAL HIGH (ref 70–99)
Glucose-Capillary: 106 mg/dL — ABNORMAL HIGH (ref 70–99)

## 2020-06-19 LAB — MRSA PCR SCREENING: MRSA by PCR: NEGATIVE

## 2020-06-19 LAB — MAGNESIUM: Magnesium: 1.9 mg/dL (ref 1.7–2.4)

## 2020-06-19 LAB — RESP PANEL BY RT-PCR (FLU A&B, COVID) ARPGX2
Influenza A by PCR: NEGATIVE
Influenza B by PCR: NEGATIVE
SARS Coronavirus 2 by RT PCR: NEGATIVE

## 2020-06-19 LAB — LACTIC ACID, PLASMA
Lactic Acid, Venous: >11 mmol/L (ref 0.5–1.9)
Lactic Acid, Venous: 2.8 mmol/L (ref 0.5–1.9)
Lactic Acid, Venous: 2.2 mmol/L (ref 0.5–1.9)

## 2020-06-19 LAB — HIV ANTIBODY (ROUTINE TESTING W REFLEX): HIV Screen 4th Generation wRfx: NONREACTIVE

## 2020-06-19 LAB — ETHANOL: Alcohol, Ethyl (B): <10 mg/dL (ref <10)

## 2020-06-19 LAB — VALPROIC ACID LEVEL: Valproic Acid Lvl: <10 ug/mL — ABNORMAL LOW (ref 50.0–100.0)

## 2020-06-19 LAB — BRAIN NATRIURETIC PEPTIDE: B Natriuretic Peptide: 287 pg/mL — ABNORMAL HIGH (ref 0.0–100.0)

## 2020-06-19 LAB — ACETAMINOPHEN LEVEL: Acetaminophen (Tylenol), Serum: 13 ug/mL (ref 10–30)

## 2020-06-19 LAB — SALICYLATE LEVEL: Salicylate Lvl: <7 mg/dL — ABNORMAL LOW (ref 7.0–30.0)

## 2020-06-19 LAB — AMMONIA: Ammonia: 147 umol/L — ABNORMAL HIGH (ref 9–35)

## 2020-06-19 LAB — CBG MONITORING, ED: Glucose-Capillary: 139 mg/dL — ABNORMAL HIGH (ref 70–99)

## 2020-06-19 MED ORDER — MIDAZOLAM HCL 2 MG/2ML IJ SOLN: 1.0000 mg | INTRAMUSCULAR | Status: DC | PRN

## 2020-06-19 MED ORDER — SODIUM CHLORIDE 0.9 % IV SOLN
2000.0000 mg | Freq: Once | INTRAVENOUS | Status: AC
  Administered 2020-06-19: 2000 mg via INTRAVENOUS
  Filled 2020-06-19: qty 20

## 2020-06-19 MED ORDER — SODIUM CHLORIDE 0.9 % IV SOLN
3.0000 g | Freq: Once | INTRAVENOUS | Status: AC
  Administered 2020-06-19: 3 g via INTRAVENOUS
  Filled 2020-06-19: qty 3

## 2020-06-19 MED ORDER — CHLORHEXIDINE GLUCONATE 0.12% ORAL RINSE (MEDLINE KIT)
15.0000 mL | Freq: Two times a day (BID) | OROMUCOSAL | Status: DC
  Administered 2020-06-19 – 2020-06-20 (×2): 15 mL via OROMUCOSAL

## 2020-06-19 MED ORDER — POLYETHYLENE GLYCOL 3350 17 G PO PACK: 17.0000 g | PACK | Freq: Every day | ORAL | Status: DC | PRN

## 2020-06-19 MED ORDER — DEXMEDETOMIDINE HCL IN NACL 400 MCG/100ML IV SOLN
0.4000 ug/kg/h | INTRAVENOUS | Status: DC
  Administered 2020-06-19: 1 ug/kg/h via INTRAVENOUS
  Filled 2020-06-19: qty 100

## 2020-06-19 MED ORDER — ROCURONIUM BROMIDE 100 MG/10ML IV SOLN
100.0000 mg | Freq: Once | INTRAVENOUS | Status: AC
  Administered 2020-06-19: 100 mg via INTRAVENOUS

## 2020-06-19 MED ORDER — LABETALOL HCL 5 MG/ML IV SOLN
5.0000 mg | Freq: Once | INTRAVENOUS | Status: AC
  Administered 2020-06-19: 5 mg via INTRAVENOUS
  Filled 2020-06-19: qty 4

## 2020-06-19 MED ORDER — INSULIN REGULAR(HUMAN) IN NACL 100-0.9 UT/100ML-% IV SOLN
INTRAVENOUS | Status: DC
  Filled 2020-06-19: qty 100

## 2020-06-19 MED ORDER — PROPOFOL 1000 MG/100ML IV EMUL
INTRAVENOUS | Status: AC
  Administered 2020-06-19: 5 ug/kg/min via INTRAVENOUS
  Filled 2020-06-19: qty 100

## 2020-06-19 MED ORDER — HEPARIN SODIUM (PORCINE) 5000 UNIT/ML IJ SOLN
5000.0000 [IU] | Freq: Three times a day (TID) | INTRAMUSCULAR | Status: DC
  Administered 2020-06-19 – 2020-06-26 (×21): 5000 [IU] via SUBCUTANEOUS
  Filled 2020-06-19 (×21): qty 1

## 2020-06-19 MED ORDER — CHLORHEXIDINE GLUCONATE CLOTH 2 % EX PADS
6.0000 | MEDICATED_PAD | Freq: Every day | CUTANEOUS | Status: DC
  Administered 2020-06-20 – 2020-06-26 (×7): 6 via TOPICAL

## 2020-06-19 MED ORDER — ORAL CARE MOUTH RINSE
15.0000 mL | OROMUCOSAL | Status: DC
  Administered 2020-06-19 – 2020-06-20 (×6): 15 mL via OROMUCOSAL

## 2020-06-19 MED ORDER — DOCUSATE SODIUM 100 MG PO CAPS: 100.0000 mg | ORAL_CAPSULE | Freq: Two times a day (BID) | ORAL | Status: DC | PRN

## 2020-06-19 MED ORDER — SODIUM CHLORIDE 0.9 % IV SOLN
3.0000 g | Freq: Four times a day (QID) | INTRAVENOUS | Status: DC
  Administered 2020-06-19 – 2020-06-20 (×5): 3 g via INTRAVENOUS
  Filled 2020-06-19: qty 3
  Filled 2020-06-19 (×6): qty 8

## 2020-06-19 MED ORDER — NOREPINEPHRINE 4 MG/250ML-% IV SOLN
INTRAVENOUS | Status: AC
  Administered 2020-06-19: 2 ug/min via INTRAVENOUS
  Filled 2020-06-19: qty 250

## 2020-06-19 MED ORDER — INSULIN ASPART 100 UNIT/ML ~~LOC~~ SOLN
0.0000 [IU] | Freq: Three times a day (TID) | SUBCUTANEOUS | Status: DC
  Administered 2020-06-19: 1 [IU] via SUBCUTANEOUS

## 2020-06-19 MED ORDER — SODIUM CHLORIDE 0.9 % IV SOLN: INTRAVENOUS | Status: DC

## 2020-06-19 MED ORDER — FENTANYL 2500MCG IN NS 250ML (10MCG/ML) PREMIX INFUSION
0.0000 ug/h | INTRAVENOUS | Status: DC
  Administered 2020-06-19: 50 ug/h via INTRAVENOUS
  Administered 2020-06-20: 175 ug/h via INTRAVENOUS
  Filled 2020-06-19 (×2): qty 250

## 2020-06-19 MED ORDER — PROPOFOL 1000 MG/100ML IV EMUL
5.0000 ug/kg/min | INTRAVENOUS | Status: DC
  Administered 2020-06-19: 50 ug/kg/min via INTRAVENOUS
  Filled 2020-06-19: qty 100

## 2020-06-19 MED ORDER — LACTATED RINGERS IV BOLUS
1000.0000 mL | Freq: Once | INTRAVENOUS | Status: AC
  Administered 2020-06-19: 1000 mL via INTRAVENOUS

## 2020-06-19 MED ORDER — PANTOPRAZOLE SODIUM 40 MG IV SOLR
40.0000 mg | Freq: Every day | INTRAVENOUS | Status: DC
  Administered 2020-06-19: 40 mg via INTRAVENOUS
  Filled 2020-06-19: qty 40

## 2020-06-19 MED ORDER — ETOMIDATE 2 MG/ML IV SOLN
20.0000 mg | Freq: Once | INTRAVENOUS | Status: AC
  Administered 2020-06-19: 20 mg via INTRAVENOUS

## 2020-06-19 MED ORDER — FENTANYL CITRATE (PF) 100 MCG/2ML IJ SOLN
25.0000 ug | INTRAMUSCULAR | Status: DC | PRN
  Administered 2020-06-19: 25 ug via INTRAVENOUS
  Filled 2020-06-19: qty 2

## 2020-06-19 MED ORDER — SODIUM CHLORIDE 0.9 % IV SOLN
250.0000 mL | INTRAVENOUS | Status: DC
  Administered 2020-06-19: 250 mL via INTRAVENOUS

## 2020-06-19 MED ORDER — NOREPINEPHRINE 4 MG/250ML-% IV SOLN: 2.0000 ug/min | INTRAVENOUS | Status: DC

## 2020-06-19 MED ORDER — DEXTROSE 50 % IV SOLN: 0.0000 mL | INTRAVENOUS | Status: DC | PRN

## 2020-06-19 MED ORDER — PHENYLEPHRINE 40 MCG/ML (10ML) SYRINGE FOR IV PUSH (FOR BLOOD PRESSURE SUPPORT)
80.0000 ug | PREFILLED_SYRINGE | Freq: Once | INTRAVENOUS | Status: AC | PRN
  Administered 2020-06-19: 200 ug via INTRAVENOUS
  Filled 2020-06-19: qty 10

## 2020-06-19 MED ORDER — RINGERS IV SOLN: INTRAVENOUS | Status: DC

## 2020-06-19 NOTE — Progress Notes (Addendum)
Pharmacy Antibiotic Note  [name] [name] is a 60 y.o. female admitted on 06/19/2020 with respiratory distress and possible aspiration pneumonia.  Pharmacy has been consulted for Unasyn dosing.  Pt has remote h/o mild PCN allergy; d/w CCM who would like to continue w/ Unasyn and monitor for reaction.  Plan: Unasyn 3g IV Q6H.  Height: 5\' 3"  (160 cm) Weight: 86.2 kg (190 lb) IBW/kg (Calculated) : 52.4  Temp (24hrs), Avg:99 F (37.2 C), Min:97.7 F (36.5 C), Max:99.6 F (37.6 C)  Recent Labs  Lab 06/19/20 0110  WBC 18.6*  CREATININE 1.35*  LATICACIDVEN >11.0*    Estimated Creatinine Clearance: 46.1 mL/min (A) (by C-G formula based on SCr of 1.35 mg/dL (H)).      Thank you for allowing pharmacy to be a part of this patient's care.  Wynona Neat, PharmD, BCPS  06/19/2020 3:34 AM

## 2020-06-19 NOTE — ED Notes (Signed)
Pts twin sister states that she took a cold medicine right before she went to bed tonight, Coricidin BBP. She wasn't sure if that was important. Twin sister is [place], [phone].

## 2020-06-19 NOTE — Progress Notes (Signed)
NAME:  [name] [name], MRN:  [id number], DOB:  10-Jun-1960, LOS: 0 ADMISSION DATE:  06/19/2020, CONSULTATION DATE: 06/19/2020 REFERRING MD:  Orpah Greek, MD, CHIEF COMPLAINT: Shortness of breath and altered mental status  Brief History:  60 year old female with unknown past medical history who was brought into the emergency department by EMS with shortness of breath, cough.  Apparently had 1 episode of seizure, intubated on mechanical ventilation  History of Present Illness:  Patient is intubated and sedated so most of the history is taken from chart review and limited information from patient's sister 60 year old female with unknown past medical history who was brought into the emergency department with a complaint of shortness of breath, cough and hypoxemia.  As per patient's sister she has not been feeling well for the last few days because of cough and nasal congestion, she took cough syrup before going into bed, after few hours she was noted to be having difficulty breathing so EMS was summoned, upon EMS arrival patient was noted to be hypoxic into 60s, she was started on bag valve ventilation and was brought into the emergency department in the ED patient apparently had left-sided shaking and then became unresponsive.  She was intubated and was placed on mechanical ventilation.  Past Medical History:  Seizure disorder Possible diabetes type 2  Significant Hospital Events:    Consults:  PCCM  Procedures:  ETT 1/12 >  Significant Diagnostic Tests:  1/12 CT head: No acute intracranial abnormality. 1/12 chest x-ray: Dense right upper lobe infiltrate/opacity  Micro Data:  1/12 influenza/COVID PCR negative  Antimicrobials:  Unasyn 1/12->  Interim History / Subjective:  1/12: admitted this am. On vent and propofol. Awaiting eeg will attempt to change sedation. Family at bedside andprovided some med history. No h/o dm so  Objective   Blood pressure (!) 149/92, pulse  79, temperature 97.7 F (36.5 C), resp. rate (!) 28, height 5\' 3"  (1.6 m), weight 86.2 kg, SpO2 100 %.    Vent Mode: PRVC FiO2 (%):  [100 %] 100 % Set Rate:  [16 bmp-28 bmp] 28 bmp Vt Set:  [420 mL] 420 mL PEEP:  [5 cmH20-10 cmH20] 10 cmH20 Plateau Pressure:  [22 cmH20] 22 cmH20   Intake/Output Summary (Last 24 hours) at 06/19/2020 [id number] Last data filed at 06/19/2020 0800 Gross per 24 hour  Intake 158.32 ml  Output --  Net 158.32 ml   Filed Weights   06/19/20 0101  Weight: 86.2 kg    Examination:   Physical exam: General: Acutely ill-appearing obese female, orally intubated HEENT: [place]/AT, eyes anicteric.  ETT and OGT in place Neuro: Sedated, not following commands.  Eyes are closed.  Pupils 4 mm bilateral reactive to light Chest: Coarse breath sounds, crackles heard on right side, no wheezes or rhonchi Heart: Tachycardic, regular rhythm, no murmurs or gallops Abdomen: Soft, nontender, nondistended, bowel sounds present Skin: No rash  Resolved Hospital Problem list     Assessment & Plan:  Acute hypoxic/hypercapnic respiratory failure likely due to aspiration pneumonia -titrate vent -hopefully will be able to wean -sats 100% and on 100% fio2 -cont abx for now.  -resp cx ordered Sepsis (POA) due to aspiration pneumonia -monitor wbc -lactate improved with fluid -cont abx  Possible seizure activity with known sz hx -await home meds -eeg pending -minimize sedation as able -loaded with keppra  Diabetic ketoacidosis: less likely, no history of dm -gluc was elevated but perhaps was given d50 in ems -urine negative for ketones -a1c pending -  gap resolved and pt did have lactic acidosis that could have resuled in gap -insulin infusion never started -monitor bs  Acute kidney injury -unclear baseline -follow uop and indices Lactic acidosis:  -improving  Acute metabolic/toxic encephalopathy -? Post ictal -will follow once sedation off     Best practice  (evaluated daily)  Diet: NPO Pain/Anxiety/Delirium protocol (if indicated): precedex, fentanyl VAP protocol (if indicated): Ordered DVT prophylaxis: Subcu heparin GI prophylaxis: Protonix Glucose control: Insulin sq Mobility: Bedrest for now Disposition: ICU  Goals of Care:  Last date of multidisciplinary goals of care discussion: sister 1/12 Family and staff present: pharmacy, self, sister, RN Summary of discussion: full code Follow up goals of care discussion due: 1/19 Code Status: Full code  Labs   CBC: Recent Labs  Lab 06/19/20 0110 06/19/20 0240 06/19/20 0353  WBC 18.6*  --   --   NEUTROABS 5.2  --   --   HGB 15.0 14.3 11.9*  HCT 49.8* 42.0 35.0*  MCV 100.6*  --   --   PLT 266  --   --     Basic Metabolic Panel: Recent Labs  Lab 06/19/20 0110 06/19/20 0240 06/19/20 0304 06/19/20 0332 06/19/20 0353  NA 140 142  --  142 140  K 3.7 3.0*  --  3.3* 3.7  CL 105  --   --  111  --   CO2 15*  --   --  19*  --   GLUCOSE 350*  --   --  132*  --   BUN 8  --   --  7  --   CREATININE 1.35*  --   --  1.05*  --   CALCIUM 8.7*  --   --  7.9*  --   MG  --   --  1.9  --   --   PHOS  --   --  4.3  --   --    GFR: Estimated Creatinine Clearance: 59.3 mL/min (A) (by C-G formula based on SCr of 1.05 mg/dL (H)). Recent Labs  Lab 06/19/20 0110  WBC 18.6*  LATICACIDVEN >11.0*    Liver Function Tests: Recent Labs  Lab 06/19/20 0110  AST 28  ALT 18  ALKPHOS 153*  BILITOT 0.1*  PROT 6.7  ALBUMIN 3.5   No results for input(s): LIPASE, AMYLASE in the last 168 hours. Recent Labs  Lab 06/19/20 0110  AMMONIA 147*    ABG    Component Value Date/Time   PHART 7.257 (L) 06/19/2020 0353   PCO2ART 48.3 (H) 06/19/2020 0353   PO2ART 148 (H) 06/19/2020 0353   HCO3 21.7 06/19/2020 0353   TCO2 23 06/19/2020 0353   ACIDBASEDEF 6.0 (H) 06/19/2020 0353   O2SAT 99.0 06/19/2020 0353     Coagulation Profile: No results for input(s): INR, PROTIME in the last 168  hours.  Cardiac Enzymes: No results for input(s): CKTOTAL, CKMB, CKMBINDEX, TROPONINI in the last 168 hours.  HbA1C: No results found for: HGBA1C  CBG: Recent Labs  Lab 06/19/20 0407  GLUCAP 139*    Additional Critical care time: The patient is critically ill with multiple organ systems failure and requires high complexity decision making for assessment and support, frequent evaluation and titration of therapies, application of advanced monitoring technologies and extensive interpretation of multiple databases.  Critical care time 32 mins. This represents my time independent of the NPs time taking care of the pt. This is excluding procedures.    Audria Nine DO [facility] 06/19/2020, 8:42 AM

## 2020-06-19 NOTE — ED Notes (Signed)
Paged dr Marlowe Sax to [facility]

## 2020-06-19 NOTE — ED Provider Notes (Signed)
[facility] EMERGENCY DEPARTMENT Provider Note   CSN: [id number] Arrival date & time: 06/19/20  0054     History Chief Complaint  Patient presents with  . Respiratory Distress  . Seizures    KEEYA DYCKMAN is a 60 y.o. female.  Patient brought to the emergency department by ambulance from home.  EMS reportedly was called to the home for difficulty breathing.  They report that the patient was exhibiting difficulty breathing and hypoxia upon their arrival.  Patient then had a generalized tonic-clonic seizure.  She was given 5 mg of Versed IV for the seizure.  Patient has been persistently hypoxic and postictal, arrives with medic assisting breathing by bag-valve-mask.        No past medical history on file.  Patient Active Problem List   Diagnosis Date Noted  . Acute respiratory failure ([place]) 06/19/2020       OB History   No obstetric history on file.     No family history on file.     Home Medications Prior to Admission medications   Not on File    Allergies    Patient has no allergy information on record.  Review of Systems   Review of Systems  Unable to perform ROS: Acuity of condition    Physical Exam Updated Vital Signs BP (!) 204/112 (BP Location: Left Arm)   Pulse (!) 123   Temp 97.7 F (36.5 C) (Axillary)   Resp 16   Ht 5\' 3"  (1.6 m)   Wt 86.2 kg   SpO2 94%   BMI 33.66 kg/m   Physical Exam Constitutional:      General: She is in acute distress.     Appearance: She is obese. She is ill-appearing.  HENT:     Head: Atraumatic.  Eyes:     Pupils: Pupils are equal, round, and reactive to light.  Cardiovascular:     Rate and Rhythm: Normal rate and regular rhythm.  Pulmonary:     Comments: Assisted ventilations by bag-valve-mask - coarse bilateral BS Abdominal:     Palpations: Abdomen is soft.  Musculoskeletal:        General: No deformity.     Cervical back: Neck supple.  Skin:    General: Skin is warm.   Neurological:     GCS: GCS eye subscore is 1. GCS verbal subscore is 1. GCS motor subscore is 1.     ED Results / Procedures / Treatments   Labs (all labs ordered are listed, but only abnormal results are displayed) Labs Reviewed  CBC WITH DIFFERENTIAL/PLATELET - Abnormal; Notable for the following components:      Result Value   WBC 18.6 (*)    HCT 49.8 (*)    MCV 100.6 (*)    Lymphs Abs 12.6 (*)    All other components within normal limits  COMPREHENSIVE METABOLIC PANEL - Abnormal; Notable for the following components:   CO2 15 (*)    Glucose, Bld 350 (*)    Creatinine, Ser 1.35 (*)    Calcium 8.7 (*)    Alkaline Phosphatase 153 (*)    Total Bilirubin 0.1 (*)    GFR, Estimated 45 (*)    Anion gap 20 (*)    All other components within normal limits  LACTIC ACID, PLASMA - Abnormal; Notable for the following components:   Lactic Acid, Venous >11.0 (*)    All other components within normal limits  AMMONIA - Abnormal; Notable for the following components:  Ammonia 147 (*)    All other components within normal limits  VALPROIC ACID LEVEL - Abnormal; Notable for the following components:   Valproic Acid Lvl <10 (*)    All other components within normal limits  PHENYTOIN LEVEL, TOTAL - Abnormal; Notable for the following components:   Phenytoin Lvl <2.5 (*)    All other components within normal limits  SALICYLATE LEVEL - Abnormal; Notable for the following components:   Salicylate Lvl <1.6 (*)    All other components within normal limits  I-STAT ARTERIAL BLOOD GAS, ED - Abnormal; Notable for the following components:   pH, Arterial 7.097 (*)    pCO2 arterial 71.3 (*)    Acid-base deficit 9.0 (*)    Potassium 3.0 (*)    All other components within normal limits  TROPONIN I (HIGH SENSITIVITY) - Abnormal; Notable for the following components:   Troponin I (High Sensitivity) 24 (*)    All other components within normal limits  RESP PANEL BY RT-PCR (FLU A&B, COVID) [id number]   ETHANOL  ACETAMINOPHEN LEVEL  BRAIN NATRIURETIC PEPTIDE  URINALYSIS, ROUTINE W REFLEX MICROSCOPIC  RAPID URINE DRUG SCREEN, HOSP PERFORMED  PATHOLOGIST SMEAR REVIEW  HIV ANTIBODY (ROUTINE TESTING W REFLEX)  CBC  CREATININE, SERUM  CBC  BASIC METABOLIC PANEL  BLOOD GAS, ARTERIAL  MAGNESIUM  PHOSPHORUS  LACTIC ACID, PLASMA  LACTIC ACID, PLASMA  I-STAT CHEM 8, ED  TROPONIN I (HIGH SENSITIVITY)    EKG None  Radiology CT HEAD WO CONTRAST  Result Date: 06/19/2020 CLINICAL DATA:  Altered mental status EXAM: CT HEAD WITHOUT CONTRAST TECHNIQUE: Contiguous axial images were obtained from the base of the skull through the vertex without intravenous contrast. COMPARISON:  10/22/2014 FINDINGS: Brain: No acute intracranial abnormality. Specifically, no hemorrhage, hydrocephalus, mass lesion, acute infarction, or significant intracranial injury. Vascular: No hyperdense vessel or unexpected calcification. Skull: No acute calvarial abnormality. Sinuses/Orbits: Visualized paranasal sinuses and mastoids clear. Orbital soft tissues unremarkable. Other: None IMPRESSION: No acute intracranial abnormality. Electronically Signed   By: Rolm Baptise M.D.   On: 06/19/2020 01:47   DG Chest Port 1 View  Result Date: 06/19/2020 CLINICAL DATA:  ET placement EXAM: PORTABLE CHEST 1 VIEW COMPARISON:  Radiograph 12/14/2014 FINDINGS: Endotracheal tube tip low in the trachea, 2.8 cm from carina. Consider retraction 1-2 cm to the mid trachea. Transesophageal tube tip and side port distal to the GE junction, beyond the margins of imaging. Telemetry leads and external support devices overlie the chest. Left chest wall battery pack with pacer leads at the right atrium and cardiac apex. Dense airspace opacities seen focally in the right upper lobe. Additional hazy interstitial opacities with fissural and septal thickening and pulmonary vascular congestion are seen elsewhere. No pneumothorax or effusion. Cardiomediastinal  contours are similar to priors accounting for differences in technique. The aorta is calcified. The remaining cardiomediastinal contours are unremarkable. No acute osseous or soft tissue abnormality. Degenerative changes are present in the imaged spine and shoulders. IMPRESSION: 1. Endotracheal tube tip low in the trachea, 2.8 cm from carina. Consider retraction 1-2 cm to the mid trachea. 2. Transesophageal tube tip and side port distal to the GE junction, beyond the margins of imaging. 3. Dense opacity within the right upper lobe, could reflect infection or asymmetric pulmonary edema with more diffuse interstitial edematous changes elsewhere in the lungs. Electronically Signed   By: Lovena Le M.D.   On: 06/19/2020 01:20    Procedures Procedures (including critical care time)  Medications Ordered in  ED Medications  propofol (DIPRIVAN) 1000 MG/100ML infusion (10 mcg/kg/min  86.2 kg Intravenous Rate/Dose Change 06/19/20 0119)  levETIRAcetam (KEPPRA) 2,000 mg in sodium chloride 0.9 % 250 mL IVPB (has no administration in time range)  Ampicillin-Sulbactam (UNASYN) 3 g in sodium chloride 0.9 % 100 mL IVPB (has no administration in time range)  lactated ringers bolus 1,000 mL (has no administration in time range)    And  lactated ringers bolus 1,000 mL (has no administration in time range)  docusate sodium (COLACE) capsule 100 mg (has no administration in time range)  polyethylene glycol (MIRALAX / GLYCOLAX) packet 17 g (has no administration in time range)  heparin injection 5,000 Units (has no administration in time range)  pantoprazole (PROTONIX) injection 40 mg (has no administration in time range)  insulin regular, human (MYXREDLIN) 100 units/ 100 mL infusion (has no administration in time range)  dextrose 50 % solution 0-50 mL (has no administration in time range)  labetalol (NORMODYNE) injection 5 mg (has no administration in time range)  etomidate (AMIDATE) injection 20 mg (20 mg  Intravenous Given 06/19/20 0058)  rocuronium (ZEMURON) injection 100 mg (100 mg Intravenous Given 06/19/20 0058)    ED Course  I have reviewed the triage vital signs and the nursing notes.  Pertinent labs & imaging results that were available during my care of the patient were reviewed by me and considered in my medical decision making (see chart for details).    MDM Rules/Calculators/A&P                          Patient presented to the emergency department from home. Patient had reportedly called EMS for shortness of breath. No family is present in the department, information is limited. EMS reports that the patient was short of breath upon their arrival but shortly after they arrived had a seizure. She does have a known seizure disorder. Patient's sister had told EMS that she did have some shaking episodes prior to their arrival as well, sounds as though she has had multiple seizures tonight. It is unclear what seizure medication she uses at home. Administered IV Keppra bolus.  Patient was given 5 mg of Versed IV push by EMS for the seizure. At arrival to the emergency department she is unresponsive. This is likely multifactorial, as she is probably postictal and also under the effects of Versed. Ventilation is being assisted by bag-valve-mask and she is hypoxic despite this. Oxygen saturations were in the 70s on arrival. She was somewhat difficult to bag. Decision was made to intubate and oxygenation has significantly improved. Intubation performed by Kennith Maes, PA-C under my direct supervision. Please see separate documentation.  Chest x-ray with right upper lobe pneumonia. Would suspect aspiration given the current clinical setting. Treated with Unasyn.  Patient with significant lactic acidosis, also likely multifactorial including seizure and infection. Blood gas shows mixed metabolic and respiratory acidosis.  Consultation to critical care to further evaluate and admit the  patient.   CRITICAL CARE Performed by: Orpah Greek   Total critical care time: 35 minutes  Critical care time was exclusive of separately billable procedures and treating other patients.  Critical care was necessary to treat or prevent imminent or life-threatening deterioration.  Critical care was time spent personally by me on the following activities: development of treatment plan with patient and/or surrogate as well as nursing, discussions with consultants, evaluation of patient's response to treatment, examination of patient, obtaining history from  patient or surrogate, ordering and performing treatments and interventions, ordering and review of laboratory studies, ordering and review of radiographic studies, pulse oximetry and re-evaluation of patient's condition.   Final Clinical Impression(s) / ED Diagnoses Final diagnoses:  Seizure ([place])  Acute respiratory failure with hypoxia and hypercapnia ([place])    Rx / DC Orders ED Discharge Orders    None       Chief Walkup, Gwenyth Allegra, MD 06/19/20 [phone]

## 2020-06-19 NOTE — ED Notes (Signed)
Attempted report x 2

## 2020-06-19 NOTE — ED Notes (Signed)
Attempted report x1. 

## 2020-06-19 NOTE — Progress Notes (Signed)
EEG completed, results pending. 

## 2020-06-19 NOTE — ED Notes (Signed)
Paged attending for RN 

## 2020-06-19 NOTE — H&P (Signed)
NAME:  [name] [name], MRN:  [id number], DOB:  August 14, 1960, LOS: 0 ADMISSION DATE:  06/19/2020, CONSULTATION DATE: 06/19/2020 REFERRING MD:  Orpah Greek, MD, CHIEF COMPLAINT: Shortness of breath and altered mental status  Brief History:  60 year old female with unknown past medical history who was brought into the emergency department by EMS with shortness of breath, cough.  Apparently had 1 episode of seizure, intubated on mechanical ventilation  History of Present Illness:  Patient is intubated and sedated so most of the history is taken from chart review and limited information from patient's sister 60 year old female with unknown past medical history who was brought into the emergency department with a complaint of shortness of breath, cough and hypoxemia.  As per patient's sister she has not been feeling well for the last few days because of cough and nasal congestion, she took cough syrup before going into bed, after few hours she was noted to be having difficulty breathing so EMS was summoned, upon EMS arrival patient was noted to be hypoxic into 60s, she was started on bag valve ventilation and was brought into the emergency department in the ED patient apparently had left-sided shaking and then became unresponsive.  She was intubated and was placed on mechanical ventilation.  Past Medical History:  Seizure disorder Possible diabetes type 2  Significant Hospital Events:    Consults:  PCCM  Procedures:  ETT 1/12 >  Significant Diagnostic Tests:  1/12 CT head: No acute intracranial abnormality. 1/12 chest x-ray: Dense right upper lobe infiltrate/opacity  Micro Data:  1/12 influenza/COVID PCR negative  Antimicrobials:  Unasyn 1/12  Interim History / Subjective:    Objective   Blood pressure (!) 204/112, pulse (!) 123, temperature 97.7 F (36.5 C), temperature source Axillary, resp. rate 16, height 5\' 3"  (1.6 m), weight 86.2 kg, SpO2 94 %.    Vent Mode:  PRVC FiO2 (%):  [100 %] 100 % Set Rate:  [16 bmp-24 bmp] 24 bmp Vt Set:  [420 mL] 420 mL PEEP:  [5 cmH20-10 cmH20] 10 cmH20 Plateau Pressure:  [22 cmH20] 22 cmH20  No intake or output data in the 24 hours ending 06/19/20 0315 Filed Weights   06/19/20 0101  Weight: 86.2 kg    Examination:   Physical exam: General: Acutely ill-appearing obese female, orally intubated HEENT: [place]/AT, eyes anicteric.  ETT and OGT in place Neuro: Sedated, not following commands.  Eyes are closed.  Pupils 4 mm bilateral reactive to light Chest: Coarse breath sounds, crackles heard on right side, no wheezes or rhonchi Heart: Tachycardic, regular rhythm, no murmurs or gallops Abdomen: Soft, nontender, nondistended, bowel sounds present Skin: No rash  Resolved Hospital Problem list     Assessment & Plan:  Acute hypoxic/hypercapnic respiratory failure likely due to aspiration pneumonia Sepsis (POA) due to aspiration pneumonia Possible seizure activity Diabetic ketoacidosis Lactic acidosis due to combination of seizure activity and sepsis Acute kidney injury Acute metabolic/toxic encephalopathy  Continue lung protective ventilation Plateau/driving pressure are at goal Elevate head end of the bed Continue IV fluid per sepsis bundle Patient looks clinically dry, even after receiving 3 L of IV fluid Bedside echocardiogram confirmed hyperdynamic heart Send urine Legionella and pneumococcal antigen Continue IV Unasyn Send respiratory, urine and blood cultures  titrate FiO2 to maintain O2 sat 90-95% Per ED patient apparently had left-sided shaking, she has history of seizure disorder Phenytoin, valproic acid levels are subtherapeutic We do know what medication patient takes to control her seizure She was loaded with  Keppra we will get EEG Trend lactic acid and serum creatinine Minimize sedation with RASS goal -1--2 We will start him on insulin infusion per DKA protocol Monitor fingersticks   Best  practice (evaluated daily)  Diet: NPO Pain/Anxiety/Delirium protocol (if indicated): Propofol VAP protocol (if indicated): Ordered DVT prophylaxis: Subcu heparin GI prophylaxis: Protonix Glucose control: Insulin infusion Mobility: Bedrest for now Disposition: ICU  Goals of Care:  Last date of multidisciplinary goals of care discussion: Pending 1/12 Family and staff present:  Summary of discussion:  Follow up goals of care discussion due:  Code Status: Full code  Labs   CBC: Recent Labs  Lab 06/19/20 0110 06/19/20 0240  WBC 18.6*  --   NEUTROABS 5.2  --   HGB 15.0 14.3  HCT 49.8* 42.0  MCV 100.6*  --   PLT 266  --     Basic Metabolic Panel: Recent Labs  Lab 06/19/20 0110 06/19/20 0240  NA 140 142  K 3.7 3.0*  CL 105  --   CO2 15*  --   GLUCOSE 350*  --   BUN 8  --   CREATININE 1.35*  --   CALCIUM 8.7*  --    GFR: Estimated Creatinine Clearance: 46.1 mL/min (A) (by C-G formula based on SCr of 1.35 mg/dL (H)). Recent Labs  Lab 06/19/20 0110  WBC 18.6*  LATICACIDVEN >11.0*    Liver Function Tests: Recent Labs  Lab 06/19/20 0110  AST 28  ALT 18  ALKPHOS 153*  BILITOT 0.1*  PROT 6.7  ALBUMIN 3.5   No results for input(s): LIPASE, AMYLASE in the last 168 hours. Recent Labs  Lab 06/19/20 0110  AMMONIA 147*    ABG    Component Value Date/Time   PHART 7.097 (LL) 06/19/2020 0240   PCO2ART 71.3 (HH) 06/19/2020 0240   PO2ART 94 06/19/2020 0240   HCO3 21.8 06/19/2020 0240   TCO2 24 06/19/2020 0240   ACIDBASEDEF 9.0 (H) 06/19/2020 0240   O2SAT 93.0 06/19/2020 0240     Coagulation Profile: No results for input(s): INR, PROTIME in the last 168 hours.  Cardiac Enzymes: No results for input(s): CKTOTAL, CKMB, CKMBINDEX, TROPONINI in the last 168 hours.  HbA1C: No results found for: HGBA1C  CBG: No results for input(s): GLUCAP in the last 168 hours.  Review of Systems:   Unable to obtain as patient is intubated and sedated  Past Medical  History:  She,  has no past medical history on file.   Surgical History:  Unable to obtain as patient is intubated and sedated  Social History:    Unable to obtain as patient is intubated and sedated  Family History:  Her family history is not on file.   Allergies Not on File   Home Medications  Prior to Admission medications   Not on File     Total critical care time: 48 minutes  Performed by: [place] care time was exclusive of separately billable procedures and treating other patients.   Critical care was necessary to treat or prevent imminent or life-threatening deterioration.   Critical care was time spent personally by me on the following activities: development of treatment plan with patient and/or surrogate as well as nursing, discussions with consultants, evaluation of patient's response to treatment, examination of patient, obtaining history from patient or surrogate, ordering and performing treatments and interventions, ordering and review of laboratory studies, ordering and review of radiographic studies, pulse oximetry and re-evaluation of patient's condition.  Jacky Kindle MD [facility] Pager: [phone] Mobile: [phone]

## 2020-06-19 NOTE — Progress Notes (Signed)
Recruitment maneuver preformed on patient due to oxygen desaturation to 88%. Patient tolerated well.

## 2020-06-19 NOTE — ED Provider Notes (Signed)
Please see attending Dr. Rosalene Billings note for full H&P.  Intubation performed with supervising physician @ bedside.   Procedure Name: Intubation Date/Time: 06/19/2020 1:00 AM Performed by: Amaryllis Dyke, PA-C Pre-anesthesia Checklist: Patient identified, Patient being monitored, Emergency Drugs available and Suction available Oxygen Delivery Method: Ambu bag Preoxygenation: Pre-oxygenation 92%  Induction Type: Rapid sequence Ventilation: Mask ventilation without difficulty Laryngoscope Size: Glidescope, Mac and 3 Tube size: 7.5 mm Placement Confirmation: ETT inserted through vocal cords under direct vision,  CO2 detector and Breath sounds checked- equal and bilateral Secured at: 21 (@ the lip) cm Tube secured with: ETT holder Dental Injury: Teeth and Oropharynx as per pre-operative assessment            Amaryllis Dyke, PA-C 06/19/20 0245    Orpah Greek, MD 06/19/20 [phone]

## 2020-06-19 NOTE — Progress Notes (Signed)
Patient transported to CT and back without complications.  

## 2020-06-19 NOTE — Procedures (Signed)
Patient Name: DIANTHA PAXSON  MRN: [id number]  Epilepsy Attending: Lora Havens  Referring Physician/Provider: Rudene Re, PA Date: 06/19/2020 Duration: 25.53 mins  Patient history: 60 year old female with history of epilepsy, noted to have an episode of seizure.  EEG to evaluate for seizures.  Level of alertness: Awake, asleep  AEDs during EEG study: None  Technical aspects: This EEG study was done with scalp electrodes positioned according to the 10-20 International system of electrode placement. Electrical activity was acquired at a sampling rate of 500Hz  and reviewed with a high frequency filter of 70Hz  and a low frequency filter of 1Hz . EEG data were recorded continuously and digitally stored.   Description: The posterior dominant rhythm consists of 9 Hz activity of moderate voltage (25-35 uV) seen predominantly in posterior head regions, symmetric and reactive to eye opening and eye closing. Sleep was characterized by vertex waves, sleep spindles (12 to 14 Hz), maximal frontocentral region.  Hyperventilation and photic stimulation were not performed.     IMPRESSION: This study is within normal limits. No seizures or epileptiform discharges were seen throughout the recording.  Jacque Byron Barbra Sarks

## 2020-06-19 NOTE — Progress Notes (Signed)
eLink Physician-Brief Progress Note Patient Name: [name] [name] DOB: 1960/07/07 MRN: [id number]   Date of Service  06/19/2020  HPI/Events of Note  Ringers solution IV fluid not on hospital formulary.   eICU Interventions  Plan: 1. D/C Rindgers Solution. 2. 0.9 NaCl to run IV at 100 mL/hour.      Intervention Category Major Interventions: Other:  Lysle Dingwall 06/19/2020, 11:14 PM

## 2020-06-20 ENCOUNTER — Inpatient Hospital Stay (HOSPITAL_COMMUNITY): Payer: Medicare HMO

## 2020-06-20 DIAGNOSIS — R569 Unspecified convulsions: Secondary | ICD-10-CM | POA: Diagnosis not present

## 2020-06-20 LAB — BASIC METABOLIC PANEL
Anion gap: 14 (ref 5–15)
BUN: 13 mg/dL (ref 6–20)
CO2: 22 mmol/L (ref 22–32)
Calcium: 8.4 mg/dL — ABNORMAL LOW (ref 8.9–10.3)
Chloride: 104 mmol/L (ref 98–111)
Creatinine, Ser: 1.21 mg/dL — ABNORMAL HIGH (ref 0.44–1.00)
GFR, Estimated: 51 mL/min — ABNORMAL LOW (ref >60)
Glucose, Bld: 97 mg/dL (ref 70–99)
Potassium: 4.6 mmol/L (ref 3.5–5.1)
Sodium: 140 mmol/L (ref 135–145)

## 2020-06-20 LAB — PATHOLOGIST SMEAR REVIEW

## 2020-06-20 LAB — GLUCOSE, CAPILLARY
Glucose-Capillary: 116 mg/dL — ABNORMAL HIGH (ref 70–99)
Glucose-Capillary: 126 mg/dL — ABNORMAL HIGH (ref 70–99)
Glucose-Capillary: 109 mg/dL — ABNORMAL HIGH (ref 70–99)
Glucose-Capillary: 94 mg/dL (ref 70–99)

## 2020-06-20 LAB — CK: Total CK: 169 U/L (ref 38–234)

## 2020-06-20 MED ORDER — FUROSEMIDE 10 MG/ML IJ SOLN
20.0000 mg | Freq: Once | INTRAMUSCULAR | Status: AC
  Administered 2020-06-20: 20 mg via INTRAVENOUS
  Filled 2020-06-20: qty 2

## 2020-06-20 MED ORDER — LAMOTRIGINE 100 MG PO TABS
200.0000 mg | ORAL_TABLET | Freq: Two times a day (BID) | ORAL | Status: DC
  Administered 2020-06-20 – 2020-06-21 (×4): 200 mg via ORAL
  Filled 2020-06-20 (×5): qty 2

## 2020-06-20 MED ORDER — STERILE WATER FOR INJECTION IV SOLN
INTRAVENOUS | Status: DC
  Filled 2020-06-20 (×2): qty 850

## 2020-06-20 MED ORDER — OXCARBAZEPINE 300 MG PO TABS
300.0000 mg | ORAL_TABLET | Freq: Two times a day (BID) | ORAL | Status: DC
  Administered 2020-06-20 – 2020-06-21 (×4): 300 mg via ORAL
  Filled 2020-06-20 (×5): qty 1

## 2020-06-20 MED ORDER — LAMOTRIGINE 25 MG PO TABS
50.0000 mg | ORAL_TABLET | Freq: Every day | ORAL | Status: DC
  Administered 2020-06-20 – 2020-06-21 (×2): 50 mg via ORAL
  Filled 2020-06-20 (×2): qty 2

## 2020-06-20 MED ORDER — SODIUM CHLORIDE 0.9 % IV SOLN
1.0000 g | Freq: Two times a day (BID) | INTRAVENOUS | Status: DC
  Administered 2020-06-21: 1 g via INTRAVENOUS
  Filled 2020-06-20 (×4): qty 10

## 2020-06-20 NOTE — Progress Notes (Signed)
NAME:  [name] [name], MRN:  [id number], DOB:  August 14, 1960, LOS: 1 ADMISSION DATE:  06/19/2020, CONSULTATION DATE: 06/19/2020 REFERRING MD:  Orpah Greek, MD, CHIEF COMPLAINT: Shortness of breath and altered mental status  Brief History:  59 year old female with unknown past medical history who was brought into the emergency department by EMS with shortness of breath, cough.  Apparently had 1 episode of seizure, intubated on mechanical ventilation  History of Present Illness:  Patient is intubated and sedated so most of the history is taken from chart review and limited information from patient's sister 60 year old female with unknown past medical history who was brought into the emergency department with a complaint of shortness of breath, cough and hypoxemia.  As per patient's sister she has not been feeling well for the last few days because of cough and nasal congestion, she took cough syrup before going into bed, after few hours she was noted to be having difficulty breathing so EMS was summoned, upon EMS arrival patient was noted to be hypoxic into 60s, she was started on bag valve ventilation and was brought into the emergency department in the ED patient apparently had left-sided shaking and then became unresponsive.  She was intubated and was placed on mechanical ventilation.  Past Medical History:  Seizure disorder Possible diabetes type 2  Significant Hospital Events:    Consults:  PCCM  Procedures:  ETT 1/12 > anticipate 1/13  Significant Diagnostic Tests:  1/12 CT head: No acute intracranial abnormality. 1/12 chest x-ray: Dense right upper lobe infiltrate/opacity  Micro Data:  1/12 influenza/COVID PCR negative  Antimicrobials:  Unasyn 1/12->1/13  Interim History / Subjective:   1/13: awake and following commands on sbt. Placed on 5/5 with good results. Resume home meds. Afebrile, wbc down, stopping abx 1/12: admitted this am. On vent and propofol.  Awaiting eeg will attempt to change sedation. Family at bedside andprovided some med history. No h/o dm so  Objective   Blood pressure (!) 161/82, pulse (!) 109, temperature 98.8 F (37.1 C), resp. rate (!) 25, height 5\' 3"  (1.6 m), weight 83.5 kg, SpO2 99 %.    Vent Mode: PSV;CPAP FiO2 (%):  [40 %-50 %] 40 % Set Rate:  [28 bmp] 28 bmp Vt Set:  [420 mL] 420 mL PEEP:  [8 cmH20-10 cmH20] 8 cmH20 Pressure Support:  [8 cmH20] 8 cmH20 Plateau Pressure:  [21 cmH20-23 cmH20] 21 cmH20   Intake/Output Summary (Last 24 hours) at 06/20/2020 1320 Last data filed at 06/20/2020 0800 Gross per 24 hour  Intake 1604.29 ml  Output 450 ml  Net 1154.29 ml   Filed Weights   06/19/20 0101 06/20/20 0441  Weight: 86.2 kg 83.5 kg    Examination:   Physical exam: General: Acutely ill-appearing obese female, orally intubated, sitting up in bed in nad HEENT: [place]/AT, eyes anicteric.  ETT and OGT in place Neuro: awake able to write notes. No focal deficits Chest: clear Heart: Tachycardic, regular rhythm, no murmurs or gallops Abdomen: Soft, nontender, nondistended, bowel sounds present Skin: No rash  Resolved Hospital Problem list     Assessment & Plan:  Acute hypoxic/hypercapnic respiratory failure likely due to aspiration pneumonia -on sbt, anticipate extubation in near future -stop abx -resp cx : ngtd Sepsis (POA) due to ?aspiration event -monitor wbc, downtrending -lactate improved with fluid -resolved   Possible seizure activity with known sz hx -resume home meds -eeg without sz -pt with increased stress due to recent dx of melanoma -consider neuro eval in  hospital for appropriate aed dosing once out of icu  Hyperglycemic on presentation -gluc was elevated but perhaps was given d50 in ems -urine negative for ketones -a1c 4.9 -gap resolved and pt did have lactic acidosis that could have resuled in gap -insulin infusion never started -monitor bs  Acute kidney injury -unclear  baseline -follow uop and indices -bmp in am -bicarb infuslin -ck level Lactic acidosis:  -improving  Acute metabolic/toxic encephalopathy -resolved     Best practice (evaluated daily)  Diet: advance as tolerated Pain/Anxiety/Delirium protocol (if indicated):  VAP protocol (if indicated):  DVT prophylaxis: Subcu heparin GI prophylaxis:  Glucose control: Insulin sq Mobility: as tolerated once out of icu Disposition: if remains stable after extubation will plan for transfer from ICU. Will have [facility] assume care on 1/14 and ccm will sign off at that time  Goals of Care:  Last date of multidisciplinary goals of care discussion: sister 1/12 Family and staff present: pharmacy, self, sister, RN Summary of discussion: full code Follow up goals of care discussion due: 1/19 Code Status: Full code  Labs   CBC: Recent Labs  Lab 06/19/20 0110 06/19/20 0240 06/19/20 0353 06/19/20 0847  WBC 18.6*  --   --  9.5  NEUTROABS 5.2  --   --   --   HGB 15.0 14.3 11.9* 11.0*  HCT 49.8* 42.0 35.0* 33.8*  MCV 100.6*  --   --  96.0  PLT 266  --   --  163    Basic Metabolic Panel: Recent Labs  Lab 06/19/20 0110 06/19/20 0240 06/19/20 0304 06/19/20 0332 06/19/20 0353 06/19/20 0847 06/19/20 1302  NA 140 142  --  142 140 140 139  K 3.7 3.0*  --  3.3* 3.7 4.8 4.5  CL 105  --   --  111  --  108 110  CO2 15*  --   --  19*  --  19* 16*  GLUCOSE 350*  --   --  132*  --  113* 119*  BUN 8  --   --  7  --  8 10  CREATININE 1.35*  --   --  1.05*  --  1.23* 1.41*  CALCIUM 8.7*  --   --  7.9*  --  7.7* 7.9*  MG  --   --  1.9  --   --   --   --   PHOS  --   --  4.3  --   --   --   --    GFR: Estimated Creatinine Clearance: 43.4 mL/min (A) (by C-G formula based on SCr of 1.41 mg/dL (H)). Recent Labs  Lab 06/19/20 0110 06/19/20 0847 06/19/20 1050  WBC 18.6* 9.5  --   LATICACIDVEN >11.0* 2.8* 2.2*    Liver Function Tests: Recent Labs  Lab 06/19/20 0110  AST 28  ALT 18  ALKPHOS  153*  BILITOT 0.1*  PROT 6.7  ALBUMIN 3.5   No results for input(s): LIPASE, AMYLASE in the last 168 hours. Recent Labs  Lab 06/19/20 0110  AMMONIA 147*    ABG    Component Value Date/Time   PHART 7.257 (L) 06/19/2020 0353   PCO2ART 48.3 (H) 06/19/2020 0353   PO2ART 148 (H) 06/19/2020 0353   HCO3 21.7 06/19/2020 0353   TCO2 23 06/19/2020 0353   ACIDBASEDEF 6.0 (H) 06/19/2020 0353   O2SAT 99.0 06/19/2020 0353     Coagulation Profile: No results for input(s): INR, PROTIME in the last 168 hours.  Cardiac Enzymes: No results for input(s): CKTOTAL, CKMB, CKMBINDEX, TROPONINI in the last 168 hours.  HbA1C: Hgb A1c MFr Bld  Date/Time Value Ref Range Status  06/19/2020 08:47 AM 4.9 4.8 - 5.6 % Final    Comment:    (NOTE) Pre diabetes:          5.7%-6.4%  Diabetes:              >6.4%  Glycemic control for   <7.0% adults with diabetes     CBG: Recent Labs  Lab 06/19/20 1138 06/19/20 1629 06/19/20 1939 06/20/20 0657 06/20/20 1156  GLUCAP 121* 102* 106* 116* 126*    Additional Critical care time: The patient is critically ill with multiple organ systems failure and requires high complexity decision making for assessment and support, frequent evaluation and titration of therapies, application of advanced monitoring technologies and extensive interpretation of multiple databases.  Critical care time 38 mins. This represents my time independent of the NPs time taking care of the pt. This is excluding procedures.    Audria Nine DO [facility] 06/20/2020, 1:20 PM

## 2020-06-20 NOTE — Plan of Care (Signed)
  Problem: Education: Goal: Knowledge of General Education information will improve Description: Including pain rating scale, medication(s)/side effects and non-pharmacologic comfort measures Outcome: Progressing   Problem: Clinical Measurements: Goal: Will remain free from infection Outcome: Progressing   

## 2020-06-20 NOTE — Procedures (Signed)
Extubation Procedure Note  Patient Details:   Name: [name] [name] DOB: 07/05/1960 MRN: [id number]   Airway Documentation:    Vent end date: 06/20/20 Vent end time: 1125   Evaluation  O2 sats: stable throughout Complications: No apparent complications Patient did tolerate procedure well. Bilateral Breath Sounds: Diminished   Yes   Patient extubated to 5L nasal cannula.  Positive cuff leak noted.  No evidence of stridor.  Patient able to speak post extubation.  Sats currently 95%.  Vitals are stable.  No complications noted.   Judith Part 06/20/2020, 11:38 AM

## 2020-06-20 NOTE — Progress Notes (Signed)
Pt had emesis this evening after drinking some clear liquids.   Changed to npo at this time. kub without ileus  cxr with worsening edema no definitive  Infiltrates noted and certain clearing of RUL density that was present at admission.   Will give dose of lasix. Check stat bmp/ck  Will also place back on ceftriaxone for cap coverage although feel like less likely in setting of decreased wbc, afebrile etc.

## 2020-06-21 ENCOUNTER — Inpatient Hospital Stay (HOSPITAL_COMMUNITY): Payer: Medicare HMO

## 2020-06-21 DIAGNOSIS — J9601 Acute respiratory failure with hypoxia: Secondary | ICD-10-CM | POA: Diagnosis not present

## 2020-06-21 DIAGNOSIS — I5042 Chronic combined systolic (congestive) and diastolic (congestive) heart failure: Secondary | ICD-10-CM

## 2020-06-21 DIAGNOSIS — J9602 Acute respiratory failure with hypercapnia: Secondary | ICD-10-CM | POA: Diagnosis not present

## 2020-06-21 DIAGNOSIS — I361 Nonrheumatic tricuspid (valve) insufficiency: Secondary | ICD-10-CM

## 2020-06-21 DIAGNOSIS — I5020 Unspecified systolic (congestive) heart failure: Secondary | ICD-10-CM | POA: Diagnosis not present

## 2020-06-21 LAB — URINALYSIS, ROUTINE W REFLEX MICROSCOPIC
Bacteria, UA: NONE SEEN
Bilirubin Urine: NEGATIVE
Glucose, UA: NEGATIVE mg/dL
Hgb urine dipstick: NEGATIVE
Ketones, ur: 5 mg/dL — AB
Leukocytes,Ua: NEGATIVE
Nitrite: NEGATIVE
Protein, ur: 30 mg/dL — AB
Specific Gravity, Urine: 1.014 (ref 1.005–1.030)
pH: 9 — ABNORMAL HIGH (ref 5.0–8.0)

## 2020-06-21 LAB — POCT I-STAT 7, (LYTES, BLD GAS, ICA,H+H)
Acid-Base Excess: 2 mmol/L (ref 0.0–2.0)
Bicarbonate: 24.6 mmol/L (ref 20.0–28.0)
Calcium, Ion: 1.16 mmol/L (ref 1.15–1.40)
HCT: 30 % — ABNORMAL LOW (ref 36.0–46.0)
Hemoglobin: 10.2 g/dL — ABNORMAL LOW (ref 12.0–15.0)
O2 Saturation: 97 %
Patient temperature: 97.7
Potassium: 3.5 mmol/L (ref 3.5–5.1)
Sodium: 140 mmol/L (ref 135–145)
TCO2: 26 mmol/L (ref 22–32)
pCO2 arterial: 31.4 mmHg — ABNORMAL LOW (ref 32.0–48.0)
pH, Arterial: 7.501 — ABNORMAL HIGH (ref 7.350–7.450)
pO2, Arterial: 78 mmHg — ABNORMAL LOW (ref 83.0–108.0)

## 2020-06-21 LAB — BASIC METABOLIC PANEL
Anion gap: 13 (ref 5–15)
BUN: 11 mg/dL (ref 6–20)
CO2: 25 mmol/L (ref 22–32)
Calcium: 8.5 mg/dL — ABNORMAL LOW (ref 8.9–10.3)
Chloride: 100 mmol/L (ref 98–111)
Creatinine, Ser: 1.09 mg/dL — ABNORMAL HIGH (ref 0.44–1.00)
GFR, Estimated: 58 mL/min — ABNORMAL LOW (ref >60)
Glucose, Bld: 92 mg/dL (ref 70–99)
Potassium: 3.3 mmol/L — ABNORMAL LOW (ref 3.5–5.1)
Sodium: 138 mmol/L (ref 135–145)

## 2020-06-21 LAB — GLUCOSE, CAPILLARY
Glucose-Capillary: 77 mg/dL (ref 70–99)
Glucose-Capillary: 88 mg/dL (ref 70–99)
Glucose-Capillary: 86 mg/dL (ref 70–99)
Glucose-Capillary: 95 mg/dL (ref 70–99)
Glucose-Capillary: 107 mg/dL — ABNORMAL HIGH (ref 70–99)
Glucose-Capillary: 116 mg/dL — ABNORMAL HIGH (ref 70–99)

## 2020-06-21 LAB — CBC
HCT: 35.6 % — ABNORMAL LOW (ref 36.0–46.0)
Hemoglobin: 11.4 g/dL — ABNORMAL LOW (ref 12.0–15.0)
MCH: 30.1 pg (ref 26.0–34.0)
MCHC: 32 g/dL (ref 30.0–36.0)
MCV: 93.9 fL (ref 80.0–100.0)
Platelets: 157 10*3/uL (ref 150–400)
RBC: 3.79 MIL/uL — ABNORMAL LOW (ref 3.87–5.11)
RDW: 13.4 % (ref 11.5–15.5)
WBC: 14.1 10*3/uL — ABNORMAL HIGH (ref 4.0–10.5)
nRBC: 0 % (ref 0.0–0.2)

## 2020-06-21 LAB — CK: Total CK: 97 U/L (ref 38–234)

## 2020-06-21 LAB — ECHOCARDIOGRAM COMPLETE
Height: 63 in
S' Lateral: 3.6 cm
Single Plane A2C EF: 36.2 %
Weight: 2786.61 oz

## 2020-06-21 LAB — PROCALCITONIN: Procalcitonin: 1.03 ng/mL

## 2020-06-21 MED ORDER — CEFTRIAXONE SODIUM 2 G IJ SOLR
2.0000 g | INTRAMUSCULAR | Status: DC
  Administered 2020-06-21: 2 g via INTRAVENOUS
  Filled 2020-06-21: qty 20

## 2020-06-21 MED ORDER — CARVEDILOL 6.25 MG PO TABS
6.2500 mg | ORAL_TABLET | Freq: Two times a day (BID) | ORAL | Status: DC
  Administered 2020-06-21: 6.25 mg via ORAL
  Filled 2020-06-21 (×2): qty 1

## 2020-06-21 MED ORDER — SODIUM CHLORIDE 0.9 % IV SOLN
3.0000 g | Freq: Four times a day (QID) | INTRAVENOUS | Status: AC
  Administered 2020-06-21 – 2020-06-22 (×5): 3 g via INTRAVENOUS
  Filled 2020-06-21: qty 3
  Filled 2020-06-21: qty 8
  Filled 2020-06-21 (×2): qty 3
  Filled 2020-06-21: qty 8
  Filled 2020-06-21: qty 3

## 2020-06-21 MED ORDER — SODIUM CHLORIDE 0.9% FLUSH
10.0000 mL | Freq: Two times a day (BID) | INTRAVENOUS | Status: DC
  Administered 2020-06-22 – 2020-06-26 (×6): 10 mL

## 2020-06-21 MED ORDER — METOPROLOL TARTRATE 5 MG/5ML IV SOLN: 5.0000 mg | Freq: Once | INTRAVENOUS | Status: AC

## 2020-06-21 MED ORDER — ETOMIDATE 2 MG/ML IV SOLN: 20.0000 mg | Freq: Once | INTRAVENOUS | Status: AC

## 2020-06-21 MED ORDER — DOCUSATE SODIUM 50 MG/5ML PO LIQD
100.0000 mg | Freq: Two times a day (BID) | ORAL | Status: DC
  Administered 2020-06-21 – 2020-06-22 (×3): 100 mg
  Filled 2020-06-21 (×4): qty 10

## 2020-06-21 MED ORDER — FUROSEMIDE 10 MG/ML IJ SOLN
40.0000 mg | Freq: Once | INTRAMUSCULAR | Status: AC
  Administered 2020-06-21: 40 mg via INTRAVENOUS
  Filled 2020-06-21: qty 4

## 2020-06-21 MED ORDER — SODIUM CHLORIDE 0.9 % IV SOLN
3.0000 g | Freq: Three times a day (TID) | INTRAVENOUS | Status: DC
  Filled 2020-06-21 (×2): qty 8

## 2020-06-21 MED ORDER — POLYETHYLENE GLYCOL 3350 17 G PO PACK
17.0000 g | PACK | Freq: Every day | ORAL | Status: DC
  Administered 2020-06-22: 17 g
  Filled 2020-06-21: qty 1

## 2020-06-21 MED ORDER — ROCURONIUM BROMIDE 10 MG/ML (PF) SYRINGE
PREFILLED_SYRINGE | INTRAVENOUS | Status: AC
  Filled 2020-06-21: qty 10

## 2020-06-21 MED ORDER — LOSARTAN POTASSIUM 25 MG PO TABS
12.5000 mg | ORAL_TABLET | Freq: Every day | ORAL | Status: DC
  Administered 2020-06-21 – 2020-06-26 (×6): 12.5 mg via ORAL
  Filled 2020-06-21 (×6): qty 1

## 2020-06-21 MED ORDER — PROPOFOL 1000 MG/100ML IV EMUL
INTRAVENOUS | Status: AC
  Administered 2020-06-21: 25 ug/kg/min via INTRAVENOUS
  Filled 2020-06-21: qty 100

## 2020-06-21 MED ORDER — SODIUM CHLORIDE 0.9 % IV SOLN: 500.0000 mg | INTRAVENOUS | Status: DC

## 2020-06-21 MED ORDER — FENTANYL 2500MCG IN NS 250ML (10MCG/ML) PREMIX INFUSION
50.0000 ug/h | INTRAVENOUS | Status: DC
  Administered 2020-06-21: 50 ug/h via INTRAVENOUS
  Administered 2020-06-22: 100 ug/h via INTRAVENOUS
  Filled 2020-06-21 (×2): qty 250

## 2020-06-21 MED ORDER — ROCURONIUM BROMIDE 50 MG/5ML IV SOLN: 80.0000 mg | Freq: Once | INTRAVENOUS | Status: DC

## 2020-06-21 MED ORDER — METOPROLOL TARTRATE 5 MG/5ML IV SOLN
INTRAVENOUS | Status: AC
  Administered 2020-06-21: 5 mg via INTRAVENOUS
  Filled 2020-06-21: qty 5

## 2020-06-21 MED ORDER — METHYLPREDNISOLONE SODIUM SUCC 40 MG IJ SOLR
40.0000 mg | Freq: Two times a day (BID) | INTRAMUSCULAR | Status: DC
  Administered 2020-06-21 – 2020-06-25 (×8): 40 mg via INTRAVENOUS
  Filled 2020-06-21 (×8): qty 1

## 2020-06-21 MED ORDER — SODIUM CHLORIDE 0.9% FLUSH
10.0000 mL | INTRAVENOUS | Status: DC | PRN
  Administered 2020-06-21: 10 mL

## 2020-06-21 MED ORDER — FENTANYL BOLUS VIA INFUSION
50.0000 ug | INTRAVENOUS | Status: DC | PRN
  Administered 2020-06-22: 50 ug via INTRAVENOUS
  Filled 2020-06-21: qty 50

## 2020-06-21 MED ORDER — FENTANYL CITRATE (PF) 100 MCG/2ML IJ SOLN
100.0000 ug | Freq: Once | INTRAMUSCULAR | Status: AC
Start: 2020-06-21 — End: 2020-06-21

## 2020-06-21 MED ORDER — PROPOFOL 1000 MG/100ML IV EMUL
0.0000 ug/kg/min | INTRAVENOUS | Status: DC
  Administered 2020-06-21: 35 ug/kg/min via INTRAVENOUS
  Administered 2020-06-22 (×2): 45 ug/kg/min via INTRAVENOUS
  Administered 2020-06-22: 30 ug/kg/min via INTRAVENOUS
  Administered 2020-06-22: 50 ug/kg/min via INTRAVENOUS
  Administered 2020-06-22: 44.937 ug/kg/min via INTRAVENOUS
  Administered 2020-06-23 (×2): 50 ug/kg/min via INTRAVENOUS
  Filled 2020-06-21 (×8): qty 100

## 2020-06-21 MED ORDER — MIDAZOLAM HCL 2 MG/2ML IJ SOLN: 3.0000 mg | Freq: Once | INTRAMUSCULAR | Status: AC

## 2020-06-21 MED ORDER — MIDAZOLAM HCL 2 MG/2ML IJ SOLN
INTRAMUSCULAR | Status: AC
  Administered 2020-06-21: 3 mg via INTRAVENOUS
  Filled 2020-06-21: qty 4

## 2020-06-21 MED ORDER — POTASSIUM CHLORIDE CRYS ER 20 MEQ PO TBCR
40.0000 meq | EXTENDED_RELEASE_TABLET | Freq: Once | ORAL | Status: AC
  Administered 2020-06-21: 40 meq via ORAL
  Filled 2020-06-21: qty 2

## 2020-06-21 MED ORDER — BUDESONIDE 0.25 MG/2ML IN SUSP
0.2500 mg | Freq: Two times a day (BID) | RESPIRATORY_TRACT | Status: DC
  Administered 2020-06-21 – 2020-06-25 (×8): 0.25 mg via RESPIRATORY_TRACT
  Filled 2020-06-21 (×8): qty 2

## 2020-06-21 MED ORDER — FENTANYL CITRATE (PF) 100 MCG/2ML IJ SOLN
50.0000 ug | Freq: Once | INTRAMUSCULAR | Status: AC
Start: 2020-06-21 — End: 2020-06-21
  Administered 2020-06-21: 50 ug via INTRAVENOUS

## 2020-06-21 MED ORDER — FENTANYL CITRATE (PF) 100 MCG/2ML IJ SOLN
INTRAMUSCULAR | Status: AC
  Administered 2020-06-21: 100 ug via INTRAVENOUS
  Filled 2020-06-21: qty 2

## 2020-06-21 MED ORDER — LEVALBUTEROL HCL 0.63 MG/3ML IN NEBU
INHALATION_SOLUTION | RESPIRATORY_TRACT | Status: AC
  Administered 2020-06-21: 0.63 mg via RESPIRATORY_TRACT
  Filled 2020-06-21: qty 3

## 2020-06-21 MED ORDER — LEVALBUTEROL HCL 0.63 MG/3ML IN NEBU: 0.6300 mg | INHALATION_SOLUTION | Freq: Four times a day (QID) | RESPIRATORY_TRACT | Status: DC | PRN

## 2020-06-21 MED ORDER — ETOMIDATE 2 MG/ML IV SOLN
INTRAVENOUS | Status: AC
  Administered 2020-06-21: 20 mg via INTRAVENOUS
  Filled 2020-06-21: qty 20

## 2020-06-21 MED ORDER — ARFORMOTEROL TARTRATE 15 MCG/2ML IN NEBU
15.0000 ug | INHALATION_SOLUTION | Freq: Two times a day (BID) | RESPIRATORY_TRACT | Status: DC
  Administered 2020-06-21 – 2020-06-25 (×8): 15 ug via RESPIRATORY_TRACT
  Filled 2020-06-21 (×8): qty 2

## 2020-06-21 MED ORDER — REVEFENACIN 175 MCG/3ML IN SOLN
175.0000 ug | Freq: Every day | RESPIRATORY_TRACT | Status: DC
  Administered 2020-06-22 – 2020-06-25 (×4): 175 ug via RESPIRATORY_TRACT
  Filled 2020-06-21 (×6): qty 3

## 2020-06-21 NOTE — Procedures (Signed)
Intubation Procedure Note  [name] [name]  [id number]  December 16, 1960  Date:06/21/20  Time:7:06 PM   Provider Performing:Kolbi Tofte C Tamala Julian    Procedure: Intubation (31500)  Indication(s) Respiratory Failure  Consent Risks of the procedure as well as the alternatives and risks of each were explained to the patient and/or caregiver.  Consent for the procedure was obtained and is signed in the bedside chart   Anesthesia Etomidate, Versed, Fentanyl and Rocuronium   Time Out Verified patient identification, verified procedure, site/side was marked, verified correct patient position, special equipment/implants available, medications/allergies/relevant history reviewed, required imaging and test results available.   Sterile Technique Usual hand hygeine, masks, and gloves were used   Procedure Description Patient positioned in bed supine.  Sedation given as noted above.  Patient was intubated with endotracheal tube using Glidescope.  View was Grade 2 only posterior commissure .  Number of attempts was 1.  Colorimetric CO2 detector was consistent with tracheal placement.   Complications/Tolerance None; patient tolerated the procedure well. Chest X-ray is ordered to verify placement.   EBL Minimal   Specimen(s) None

## 2020-06-21 NOTE — Progress Notes (Signed)
  Echocardiogram 2D Echocardiogram has been performed.  [name] [name] 06/21/2020, 1:50 PM

## 2020-06-21 NOTE — Progress Notes (Signed)
PROGRESS NOTE    [name] [name]  [id number] DOB: 09-26-1960 DOA: 06/19/2020 PCP: Aretta Nip, MD   Chief Complaint  Patient presents with   Respiratory Distress   Seizures  Brief Narrative: 60 year old female with history of seizure disorder, diabetes mellitus,(other history unknown admission due to AMS) brought to the ED with shortness of breath, cough and apparently had 1 episode of seizure and intubated on mechanical ventilator and admitted to ICU.  Reportedly patient not feeling well for last few days because of cough and nasal congestion and took cough syrup before going to bed and noted to have difficulty breathing for EMS she was hypoxic into 61 EXTR and back valve ventilation and brought to the ED where she had left-sided shaking and then became unresponsive and was intubated. Admitted to ICU 1/12.Extubated 1/13. 1/14-transferred to [place].  Subjective: Patient is alert awake, sister at the bedside. Mild neck pain. Events noted overnight. Complains of some dysuria  Assessment & Plan:  Acute hypoxic/hypercapnic respiratory failure likely due to aspiration: Patient has been extubated. Cont [place] supplemental oxygen- this am on 6l - chest x-ray in ICU showed borderline cardiomegaly with findings of CHF but no hx of chf, bnp was 287 on admission, no priot echo- we willll obtain TTE. Check procal.  Sepsis POA, ?  Aspiration.  Patient has clinically improved, initially on Unasyn stopped 1/13 in ICU-due to ongoing leukocytosis and hypoxia repeating chest x-ray, check procalcitonin if it is high we will add back on antibiotics.  Sputum culture with gram-positive cocci in gram stain, culture recommended  Leukocytosis:18k on admission- down to 9k- no labs on 1/13- but today up at 14k- afebrile, ?etiology-Check UA, procalcitonin, cxr. Recent Labs  Lab 06/19/20 0110 06/19/20 0847 06/19/20 1050 06/21/20 0231  WBC 18.6* 9.5  --  14.1*  LATICACIDVEN >11.0* 2.8* 2.2*  --     Lactic acidosis on admission donwtrended  Hypokalemia will replete orally.  Possible seizure while history of seizure disorder with possible seizure with left hand shaking in the ED.  No recurrence.  Patient is on Lamictal and Trileptal CT head unremarkable on admission.  EEG did not show seizure.  Ammonia was high 147.  Repeat.  Urine drug screen positive for benzodiazepine.  Discussed with Dr. Rory Percy from neurology, recommends to get MRI brain which if unremarkable then okay to continue current medication and no need for meds adjustment and advised outpatient follow-up with her neurologist.  Acute metabolic toxic encephalopathy - resolved.  She is alert and oriented x3.  Dysuria we will check her UA.Urinalysis was unremarkable on admission.  Hyperglycemia on presentation hemoglobin A1c is normal 4.9.Anion gap on admission-lactic acidosis.  Stop bicarb.  Acute kidney injury it has resolved.  DC bicarb. Recent Labs  Lab 06/19/20 0332 06/19/20 0847 06/19/20 1302 06/20/20 1941 06/21/20 0231  BUN 7 8 10 13 11   CREATININE 1.05* 1.23* 1.41* 1.21* 1.09*   Morbid obesity BMI 30.8. will benefit outpatient PCP follow-up  Nutrition: Diet Order            Diet clear liquid Room service appropriate? Yes; Fluid consistency: Thin  Diet effective now                 DVT prophylaxis: heparin injection 5,000 Units Start: 06/19/20 0600 SCDs Start: 06/19/20 0318 Code Status:   Code Status: Full Code  Family Communication: plan of care discussed with patient at bedside.  Status is: Inpatient  Remains inpatient appropriate because:Inpatient level of care appropriate due to  severity of illness and Ongoing work-up on hypoxia,need mri brain, ptot   Dispo: The patient is from: Home              Anticipated d/c is to: Home              Anticipated d/c date is: 1 day              Patient currently is not medically stable to d/c.  Consultants:see note  Procedures:see  note  Culture/Microbiology    Component Value Date/Time   SDES TRACHEAL ASPIRATE 06/19/2020 1352   SPECREQUEST NONE 06/19/2020 1352   CULT  06/19/2020 1352    CULTURE REINCUBATED FOR BETTER GROWTH Performed at [facility] [address], [place], [place] [address]    REPTSTATUS PENDING 06/19/2020 1352    Other culture-see note  Medications: Scheduled Meds:  Chlorhexidine Gluconate Cloth  6 each Topical Daily   heparin  5,000 Units Subcutaneous Q8H   insulin aspart  0-9 Units Subcutaneous TID WC   lamoTRIgine  200 mg Oral BID   And   lamoTRIgine  50 mg Oral QHS   OXcarbazepine  300 mg Oral BID   sodium chloride flush  10-40 mL Intracatheter Q12H   Continuous Infusions:  sodium chloride Stopped (06/19/20 2317)   cefTRIAXone (ROCEPHIN)  IV Stopped (06/21/20 0709)    sodium bicarbonate (isotonic) infusion in sterile water 75 mL/hr at 06/21/20 0800    Antimicrobials: Anti-infectives (From admission, onward)   Start     Dose/Rate Route Frequency Ordered Stop   06/20/20 1830  cefTRIAXone (ROCEPHIN) 1 g in sodium chloride 0.9 % 100 mL IVPB        1 g 200 mL/hr over 30 Minutes Intravenous Every 12 hours 06/20/20 1752 06/27/20 1829   06/19/20 1000  Ampicillin-Sulbactam (UNASYN) 3 g in sodium chloride 0.9 % 100 mL IVPB  Status:  Discontinued        3 g 200 mL/hr over 30 Minutes Intravenous Every 6 hours 06/19/20 0331 06/20/20 1007   06/19/20 0300  Ampicillin-Sulbactam (UNASYN) 3 g in sodium chloride 0.9 % 100 mL IVPB        3 g 200 mL/hr over 30 Minutes Intravenous  Once 06/19/20 0255 06/19/20 0432     Objective: Vitals: Today's Vitals   06/21/20 0500 06/21/20 0600 06/21/20 0700 06/21/20 0742  BP: (!) 157/77 (!) 155/74 (!) 145/71 (!) 145/71  Pulse: 78 84 85 89  Resp: (!) 23 (!) 21 (!) 25 14  Temp:      TempSrc:      SpO2: 100% 100% 100% 94%  Weight:  79 kg    Height:      PainSc:   0-No pain     Intake/Output Summary (Last 24 hours) at 06/21/2020  1121 Last data filed at 06/21/2020 0800 Gross per 24 hour  Intake 1833.37 ml  Output 2500 ml  Net -666.63 ml   Filed Weights   06/19/20 0101 06/20/20 0441 06/21/20 0600  Weight: 86.2 kg 83.5 kg 79 kg   Weight change: -4.5 kg  Intake/Output from previous day: 01/13 0701 - 01/14 0700 In: 1268.4 [I.V.:1268.4] Out: 2550 [Urine:2500; Emesis/NG output:50] Intake/Output this shift: Total I/O In: 565 [P.O.:240; I.V.:225; IV Piggyback:100] Out: -   Examination: General exam: AAOx3 ,NAD, weak appearing. HEENT:Oral mucosa moist, Ear/Nose WNL grossly,dentition normal. Respiratory system: bilaterally diminished,no wheezing or crackles,no use of accessory muscle, non tender. Cardiovascular system: S1 & S2 +, regular, No JVD. Gastrointestinal system: Abdomen  soft, NT,ND, BS+. Nervous System:Alert, awake, moving extremities and grossly nonfocal Extremities: No edema, distal peripheral pulses palpable.  Skin: No rashes,no icterus. MSK: Normal muscle bulk,tone, power  Data Reviewed: I have personally reviewed following labs and imaging studies CBC: Recent Labs  Lab 06/19/20 0110 06/19/20 0240 06/19/20 0353 06/19/20 0847 06/21/20 0231  WBC 18.6*  --   --  9.5 14.1*  NEUTROABS 5.2  --   --   --   --   HGB 15.0 14.3 11.9* 11.0* 11.4*  HCT 49.8* 42.0 35.0* 33.8* 35.6*  MCV 100.6*  --   --  96.0 93.9  PLT 266  --   --  166 [id number]   Basic Metabolic Panel: Recent Labs  Lab 06/19/20 0304 06/19/20 0332 06/19/20 0353 06/19/20 0847 06/19/20 1302 06/20/20 1941 06/21/20 0231  NA  --  142 140 140 139 140 138  K  --  3.3* 3.7 4.8 4.5 4.6 3.3*  CL  --  111  --  108 110 104 100  CO2  --  19*  --  19* 16* 22 25  GLUCOSE  --  132*  --  113* 119* 97 92  BUN  --  7  --  8 10 13 11   CREATININE  --  1.05*  --  1.23* 1.41* 1.21* 1.09*  CALCIUM  --  7.9*  --  7.7* 7.9* 8.4* 8.5*  MG 1.9  --   --   --   --   --   --   PHOS 4.3  --   --   --   --   --   --    GFR: Estimated Creatinine Clearance:  54.6 mL/min (A) (by C-G formula based on SCr of 1.09 mg/dL (H)). Liver Function Tests: Recent Labs  Lab 06/19/20 0110  AST 28  ALT 18  ALKPHOS 153*  BILITOT 0.1*  PROT 6.7  ALBUMIN 3.5   No results for input(s): LIPASE, AMYLASE in the last 168 hours. Recent Labs  Lab 06/19/20 0110  AMMONIA 147*   Coagulation Profile: No results for input(s): INR, PROTIME in the last 168 hours. Cardiac Enzymes: Recent Labs  Lab 06/20/20 1941 06/21/20 0231  CKTOTAL 169 97   BNP (last 3 results) No results for input(s): PROBNP in the last 8760 hours. HbA1C: Recent Labs    06/19/20 0847  HGBA1C 4.9   CBG: Recent Labs  Lab 06/20/20 1156 06/20/20 1547 06/20/20 2250 06/21/20 0641 06/21/20 0657  GLUCAP 126* 109* 94 77 88   Lipid Profile: No results for input(s): CHOL, HDL, LDLCALC, TRIG, CHOLHDL, LDLDIRECT in the last 72 hours. Thyroid Function Tests: No results for input(s): TSH, T4TOTAL, FREET4, T3FREE, THYROIDAB in the last 72 hours. Anemia Panel: No results for input(s): VITAMINB12, FOLATE, FERRITIN, TIBC, IRON, RETICCTPCT in the last 72 hours. Sepsis Labs: Recent Labs  Lab 06/19/20 0110 06/19/20 0847 06/19/20 1050  LATICACIDVEN >11.0* 2.8* 2.2*    Recent Results (from the past 240 hour(s))  Resp Panel by RT-PCR (Flu A&B, Covid) Nasopharyngeal Swab     Status: None   Collection Time: 06/19/20  1:43 AM   Specimen: Nasopharyngeal Swab; Nasopharyngeal(NP) swabs in vial transport medium  Result Value Ref Range Status   SARS Coronavirus 2 by RT PCR NEGATIVE NEGATIVE Final    Comment: (NOTE) SARS-CoV-2 target nucleic acids are NOT DETECTED.  The SARS-CoV-2 RNA is generally detectable in upper respiratory specimens during the acute phase of infection. The lowest concentration of SARS-CoV-2 viral copies this  assay can detect is 138 copies/mL. A negative result does not preclude SARS-Cov-2 infection and should not be used as the sole basis for treatment or other patient  management decisions. A negative result may occur with  improper specimen collection/handling, submission of specimen other than nasopharyngeal swab, presence of viral mutation(s) within the areas targeted by this assay, and inadequate number of viral copies(<138 copies/mL). A negative result must be combined with clinical observations, patient history, and epidemiological information. The expected result is Negative.  Fact Sheet for Patients:  EntrepreneurPulse.com.au  Fact Sheet for Healthcare Providers:  IncredibleEmployment.be  This test is no t yet approved or cleared by the [place] FDA and  has been authorized for detection and/or diagnosis of SARS-CoV-2 by FDA under an Emergency Use Authorization (EUA). This EUA will remain  in effect (meaning this test can be used) for the duration of the COVID-19 declaration under Section 564(b)(1) of the Act, 21 U.S.C.section 360bbb-3(b)(1), unless the authorization is terminated  or revoked sooner.       Influenza A by PCR NEGATIVE NEGATIVE Final   Influenza B by PCR NEGATIVE NEGATIVE Final    Comment: (NOTE) The Xpert Xpress SARS-CoV-2/FLU/RSV plus assay is intended as an aid in the diagnosis of influenza from Nasopharyngeal swab specimens and should not be used as a sole basis for treatment. Nasal washings and aspirates are unacceptable for Xpert Xpress SARS-CoV-2/FLU/RSV testing.  Fact Sheet for Patients: EntrepreneurPulse.com.au  Fact Sheet for Healthcare Providers: IncredibleEmployment.be  This test is not yet approved or cleared by the [place] FDA and has been authorized for detection and/or diagnosis of SARS-CoV-2 by FDA under an Emergency Use Authorization (EUA). This EUA will remain in effect (meaning this test can be used) for the duration of the COVID-19 declaration under Section 564(b)(1) of the Act, 21 U.S.C. section 360bbb-3(b)(1),  unless the authorization is terminated or revoked.  Performed at [facility], [place] [address], [place], [place] [address]   MRSA PCR Screening     Status: None   Collection Time: 06/19/20  8:08 AM   Specimen: Nasal Mucosa; Nasopharyngeal  Result Value Ref Range Status   MRSA by PCR NEGATIVE NEGATIVE Final    Comment:        The GeneXpert MRSA Assay (FDA approved for NASAL specimens only), is one component of a comprehensive MRSA colonization surveillance program. It is not intended to diagnose MRSA infection nor to guide or monitor treatment for MRSA infections. Performed at [facility], [place] [address], [place], [place] [address]   Culture, respiratory     Status: None (Preliminary result)   Collection Time: 06/19/20  1:52 PM   Specimen: Tracheal Aspirate; Respiratory  Result Value Ref Range Status   Specimen Description TRACHEAL ASPIRATE  Final   Special Requests NONE  Final   Gram Stain   Final    FEW WBC PRESENT, PREDOMINANTLY PMN RARE GRAM POSITIVE COCCI    Culture   Final    CULTURE REINCUBATED FOR BETTER GROWTH Performed at [facility], [place] [address] [place], [place] [address]    Report Status PENDING  Incomplete     Radiology Studies: DG Abd 1 View  Result Date: 06/20/2020 CLINICAL DATA:  Vomiting EXAM: ABDOMEN - 1 VIEW COMPARISON:  None. FINDINGS: The bowel gas pattern is normal. No radio-opaque calculi or other significant radiographic abnormality are seen. IMPRESSION: No evidence of bowel obstruction. Electronically Signed   By: Dahlia Bailiff MD   On:  06/20/2020 17:38   DG CHEST PORT 1 VIEW  Result Date: 06/20/2020 CLINICAL DATA:  60 year old female with acute respiratory failure. EXAM: PORTABLE CHEST 1 VIEW COMPARISON:  Chest radiograph dated 06/19/2020. FINDINGS: There is borderline cardiomegaly with vascular congestion and mild edema. Pneumonia is not excluded. Clinical correlation is recommended. Trace pleural effusions may be  present. No pneumothorax. Left pectoral pacemaker device. No acute osseous pathology. IMPRESSION: Borderline cardiomegaly with findings of CHF. Pneumonia is not excluded. Electronically Signed   By: Anner Crete M.D.   On: 06/20/2020 16:26   EEG adult  Result Date: 06/19/2020 Lora Havens, MD     06/19/2020 11:34 AM Patient Name: VALTA AYDT MRN: [id number] Epilepsy Attending: Lora Havens Referring Physician/Provider: Rudene Re, PA Date: 06/19/2020 Duration: 25.53 mins Patient history: 60 year old female with history of epilepsy, noted to have an episode of seizure.  EEG to evaluate for seizures. Level of alertness: Awake, asleep AEDs during EEG study: None Technical aspects: This EEG study was done with scalp electrodes positioned according to the 10-20 International system of electrode placement. Electrical activity was acquired at a sampling rate of 500Hz  and reviewed with a high frequency filter of 70Hz  and a low frequency filter of 1Hz . EEG data were recorded continuously and digitally stored. Description: The posterior dominant rhythm consists of 9 Hz activity of moderate voltage (25-35 uV) seen predominantly in posterior head regions, symmetric and reactive to eye opening and eye closing. Sleep was characterized by vertex waves, sleep spindles (12 to 14 Hz), maximal frontocentral region.  Hyperventilation and photic stimulation were not performed.   IMPRESSION: This study is within normal limits. No seizures or epileptiform discharges were seen throughout the recording. Lora Havens     LOS: 2 days   Antonieta Pert, MD [facility]  06/21/2020, 11:21 AM

## 2020-06-21 NOTE — Progress Notes (Signed)
Pharmacy Antibiotic Note  [name] [name] is a 60 y.o. female admitted on 06/19/2020 with aspiration pneumonia.  Pharmacy has been consulted for Unasyn dosing. Pt has remote h/o mild PCN allergy, but tolerated Unasyn a few days ago.  Plan: Unasyn 3 g IV every 6 hours Monitor clinical progress, cultures/sensitivities, renal function, abx plan   Height: 5\' 3"  (160 cm) Weight: 79 kg (174 lb 2.6 oz) IBW/kg (Calculated) : 52.4  Temp (24hrs), Avg:99.8 F (37.7 C), Min:99.8 F (37.7 C), Max:99.8 F (37.7 C)  Recent Labs  Lab 06/19/20 0110 06/19/20 0332 06/19/20 0847 06/19/20 1050 06/19/20 1302 06/20/20 1941 06/21/20 0231  WBC 18.6*  --  9.5  --   --   --  14.1*  CREATININE 1.35* 1.05* 1.23*  --  1.41* 1.21* 1.09*  LATICACIDVEN >11.0*  --  2.8* 2.2*  --   --   --     Estimated Creatinine Clearance: 54.6 mL/min (A) (by C-G formula based on SCr of 1.09 mg/dL (H)).    Allergies  Allergen Reactions  . Aspirin     UNK reaction  . Ciprofloxacin     UNK reaction  . Codeine Hives  . Hydrocodone Hives  . Oxycodone Hives  . Penicillins Hives and Swelling    Pt tolerated ampicillin/sulbactam without issues 06/2020    Antimicrobials this admission: 1/14 CTX >> 1/14 1/12 Unasyn >> 1/13 1/14 Unasyn >>  Dose adjustments this admission:  Microbiology results: 1/12 Tracheal Aspirate: rare GPC: reincubated for better growth  1/12 MRSA PCR: negative    Thank you for allowing [place] to participate in this patients care.   Jens Som, PharmD Please see amion for complete clinical pharmacist phone list. 06/21/2020 6:44 PM

## 2020-06-21 NOTE — Consult Note (Addendum)
Cardiology Consultation:   Patient ID: [name] [name] MRN: [id number]; DOB: [birth year]  Admit date: 06/19/2020 Date of Consult: 06/21/2020  Primary Care Provider: Aretta Nip, MD [facility] Cardiologist: [name] [name] Electrophysiologist: Dr. Caryl [name]   Patient Profile:   [name] [name] is a 60 y.o. female with a hx of complete heart block status post MDT dual chamber PPM implanted 2016, left bundle blanch block, hypertension, diabetes mellitus seizure disorder, who is being seen today for the evaluation of abnormal echocardiogram at the request of [name] Pert, MD.   Low risk pharmacological stress nuclear study 10/2014 with LBBB-related fixed perfusion artifact, otherwise normal perfusion and normal left ventricular regional and global systolic function.  Echocardiogram in 12/2014 showed LV function of 55 to 60%, no wall motion abnormality and grade 1 diastolic dysfunction.  History of Present Illness:   [name] [name] admitted June 19, 2020 for acute hypoxic hypercarbic respiratory failure likely secondary to aspiration.  EMS was called for shortness of breath, cough, nasal congestion.  Found hypoxic at 60%.  Brought to ED where patient had seizure-like episode and then become unresponsive requiring intubation on mechanical ventilator.  Treated with Zosyn initially. Hydrated for AKI.  Extubated 1/13.  Patient has clinically improved.  Echocardiogram today showed LV function of 40 to 45%, global hypokinesis, mild LVH and grade 2 diastolic dysfunction.  Normal RV function.  Moderately elevated pulmonary artery systolic pressure.  Small pericardial effusion.  Mild to moderate tricuspid regurgitation. Cardiology is asked for further evaluation.  K 3.3. Scr 1.09 Procalcitonin normal Hemoglobin 11.4 Chest X-ray yesterday showed stable bilateral pleural effusion  Patient reported 2 to 3 days history of cough and congestion which progressively worsened.  She was not able  to lay flat consistent with orthopnea and PND.  Then she had a sudden onset shortness of breath requiring EMS activation.  Currently, patient reports improved shortness of breath.  Feels palpitation.  Reports tobacco smoking for greater than 40 years.  Currently smoking less than half a pack a per day, used to smoke greater than 1 pack/day.   As listed above.  Reviewed other medical record.   Inpatient Medications: Scheduled Meds: . Chlorhexidine Gluconate Cloth  6 each Topical Daily  . heparin  5,000 Units Subcutaneous Q8H  . insulin aspart  0-9 Units Subcutaneous TID WC  . lamoTRIgine  200 mg Oral BID   And  . lamoTRIgine  50 mg Oral QHS  . OXcarbazepine  300 mg Oral BID  . sodium chloride flush  10-40 mL Intracatheter Q12H   Continuous Infusions: . sodium chloride Stopped (06/19/20 2317)  . cefTRIAXone (ROCEPHIN)  IV     PRN Meds: dextrose, docusate sodium, polyethylene glycol, sodium chloride flush  Allergies:    Allergies  Allergen Reactions  . Aspirin     UNK reaction  . Ciprofloxacin     UNK reaction  . Codeine Hives  . Hydrocodone Hives  . Oxycodone Hives  . Penicillins Hives and Swelling    Pt tolerated ampicillin/sulbactam without issues 06/2020    Social History:   Social History   Socioeconomic History  . Marital status: Single    Spouse name: Not on file  . Number of children: Not on file  . Years of education: Not on file  . Highest education level: Not on file  Occupational History  . Not on file  Tobacco Use  . Smoking status: Not on file  . Smokeless tobacco: Not on file  Substance and Sexual Activity  .  Alcohol use: Not on file  . Drug use: Not on file  . Sexual activity: Not on file  Other Topics Concern  . Not on file  Social History Narrative  . Not on file   Social Determinants of Health   Financial Resource Strain: Not on file  Food Insecurity: Not on file  Transportation Needs: Not on file  Physical Activity: Not on file   Stress: Not on file  Social Connections: Not on file  Intimate Partner Violence: Not on file    Family History:   Reviewed from another medical record  ROS:  Please see the history of present illness.  All other ROS reviewed and negative.     Physical Exam/Data:   Vitals:   06/21/20 1200 06/21/20 1300 06/21/20 1508 06/21/20 1606  BP: (!) 146/77 (!) 143/73 (!) 165/84   Pulse: 81 87 93   Resp: (!) 22 (!) 22 (!) 23   Temp:    99.8 F (37.7 C)  TempSrc:    Oral  SpO2: 97% 99% 98%   Weight:      Height:        Intake/Output Summary (Last 24 hours) at 06/21/2020 1619 Last data filed at 06/21/2020 1400 Gross per 24 hour  Intake 1987.05 ml  Output 2851 ml  Net -863.95 ml   Last 3 Weights 06/21/2020 06/20/2020 06/19/2020  Weight (lbs) 174 lb 2.6 oz 184 lb 1.4 oz 190 lb  Weight (kg) 79 kg 83.5 kg 86.183 kg     Body mass index is 30.85 kg/m.  General:  Severe respiratory distress  HEENT: normal Lymph: no adenopathy Neck:JVD difficult to assess Endocrine:  No thryomegaly Vascular: No carotid bruits; FA pulses 2+ bilaterally without bruits  Cardiac:  normal S1, S2; regular tachycardic; no murmur  Lungs:  Tight bilateral wheezing ,  Forced expiratory wheezing .   Mod - severe respiratory distress  Abd: soft, nontender, no hepatomegaly  Ext: Trace RLE Musculoskeletal:  No deformities, BUE and BLE strength normal and equal Skin: warm and dry  Neuro:  CNs 2-12 intact, no focal abnormalities noted Psych:  Normal affect   EKG:  The EKG was personally reviewed and demonstrates: Rhythm at rate of 94 bpm, left bundle branch block Telemetry:  Telemetry was personally reviewed and demonstrates, Sinus rhythm at rate of 90 bpm, intermittently rate elevation to 130s>> Rhythm looks like sinus tachycardia  Relevant CV Studies:  Echo  06/21/20 1. Left ventricular ejection fraction, by estimation, is 40 to 45%. The  left ventricle has mildly decreased function. The left ventricle   demonstrates global hypokinesis. There is mild concentric left ventricular  hypertrophy. Left ventricular diastolic  parameters are consistent with Grade II diastolic dysfunction  (pseudonormalization).  2. Right ventricular systolic function is normal. The right ventricular  size is normal. There is moderately elevated pulmonary artery systolic  pressure. The estimated right ventricular systolic pressure is 51.0 mmHg.  3. A small pericardial effusion is present.  4. The mitral valve is normal in structure. Trivial mitral valve  regurgitation.  5. Tricuspid valve regurgitation is mild to moderate.  6. The aortic valve is tricuspid. Aortic valve regurgitation is not  visualized. No aortic stenosis is present.  7. The inferior vena cava is dilated in size with >50% respiratory  variability, suggesting right atrial pressure of 8 mmHg.   Echo 2016 -------------------------------------------------------------------  Study Conclusions   - Left ventricle: The cavity size was normal. Wall thickness was  increased in a pattern of mild  LVH. Systolic function was normal.  The estimated ejection fraction was in the range of 55% to 60%.  Wall motion was normal; there were no regional wall motion  abnormalities. Doppler parameters are consistent with abnormal  left ventricular relaxation (grade 1 diastolic dysfunction).  - Pericardium, extracardiac: A trivial pericardial effusion was  identified. Features were not consistent with tamponade  physiology.   Laboratory Data:  High Sensitivity Troponin:   Recent Labs  Lab 06/19/20 0110 06/19/20 0304  TROPONINIHS 24* 87*     Chemistry Recent Labs  Lab 06/19/20 1302 06/20/20 1941 06/21/20 0231  NA 139 140 138  K 4.5 4.6 3.3*  CL 110 104 100  CO2 16* 22 25  GLUCOSE 119* 97 92  BUN 10 13 11   CREATININE 1.41* 1.21* 1.09*  CALCIUM 7.9* 8.4* 8.5*  GFRNONAA 43* 51* 58*  ANIONGAP 13 14 13     Recent Labs  Lab  06/19/20 0110  PROT 6.7  ALBUMIN 3.5  AST 28  ALT 18  ALKPHOS 153*  BILITOT 0.1*   Hematology Recent Labs  Lab 06/19/20 0110 06/19/20 0240 06/19/20 0353 06/19/20 0847 06/21/20 0231  WBC 18.6*  --   --  9.5 14.1*  RBC 4.95  --   --  3.52* 3.79*  HGB 15.0   < > 11.9* 11.0* 11.4*  HCT 49.8*   < > 35.0* 33.8* 35.6*  MCV 100.6*  --   --  96.0 93.9  MCH 30.3  --   --  31.3 30.1  MCHC 30.1  --   --  32.5 32.0  RDW 12.9  --   --  13.9 13.4  PLT 266  --   --  166 157   < > = values in this interval not displayed.   BNP Recent Labs  Lab 06/19/20 0110  BNP 287.0*    DDimer No results for input(s): DDIMER in the last 168 hours.   Radiology/Studies:  DG Abd 1 View  Result Date: 06/20/2020 CLINICAL DATA:  Vomiting EXAM: ABDOMEN - 1 VIEW COMPARISON:  None. FINDINGS: The bowel gas pattern is normal. No radio-opaque calculi or other significant radiographic abnormality are seen. IMPRESSION: No evidence of bowel obstruction. Electronically Signed   By: Dahlia Bailiff MD   On: 06/20/2020 17:38   CT HEAD WO CONTRAST  Result Date: 06/19/2020 CLINICAL DATA:  Altered mental status EXAM: CT HEAD WITHOUT CONTRAST TECHNIQUE: Contiguous axial images were obtained from the base of the skull through the vertex without intravenous contrast. COMPARISON:  10/22/2014 FINDINGS: Brain: No acute intracranial abnormality. Specifically, no hemorrhage, hydrocephalus, mass lesion, acute infarction, or significant intracranial injury. Vascular: No hyperdense vessel or unexpected calcification. Skull: No acute calvarial abnormality. Sinuses/Orbits: Visualized paranasal sinuses and mastoids clear. Orbital soft tissues unremarkable. Other: None IMPRESSION: No acute intracranial abnormality. Electronically Signed   By: Rolm Baptise M.D.   On: 06/19/2020 01:47   DG Chest Port 1 View  Result Date: 06/21/2020 CLINICAL DATA:  Hypoxia.  Recent congestion. EXAM: PORTABLE CHEST 1 VIEW COMPARISON:  06/20/2020 FINDINGS:  Left chest wall pacer device is noted with leads in the right atrial appendage and right ventricle. The heart size is normal. Small pleural effusions and pulmonary vascular congestion is again noted. Interval increased opacification of the right lower lobe. IMPRESSION: 1. Stable small bilateral pleural effusions and pulmonary vascular congestion. 2. Worsening aeration to the right lower lobe. Electronically Signed   By: Kerby Moors M.D.   On: 06/21/2020 12:15   DG CHEST  PORT 1 VIEW  Result Date: 06/20/2020 CLINICAL DATA:  60 year old female with acute respiratory failure. EXAM: PORTABLE CHEST 1 VIEW COMPARISON:  Chest radiograph dated 06/19/2020. FINDINGS: There is borderline cardiomegaly with vascular congestion and mild edema. Pneumonia is not excluded. Clinical correlation is recommended. Trace pleural effusions may be present. No pneumothorax. Left pectoral pacemaker device. No acute osseous pathology. IMPRESSION: Borderline cardiomegaly with findings of CHF. Pneumonia is not excluded. Electronically Signed   By: Anner Crete M.D.   On: 06/20/2020 16:26   DG Chest Port 1 View  Result Date: 06/19/2020 CLINICAL DATA:  ET placement EXAM: PORTABLE CHEST 1 VIEW COMPARISON:  Radiograph 12/14/2014 FINDINGS: Endotracheal tube tip low in the trachea, 2.8 cm from carina. Consider retraction 1-2 cm to the mid trachea. Transesophageal tube tip and side port distal to the GE junction, beyond the margins of imaging. Telemetry leads and external support devices overlie the chest. Left chest wall battery pack with pacer leads at the right atrium and cardiac apex. Dense airspace opacities seen focally in the right upper lobe. Additional hazy interstitial opacities with fissural and septal thickening and pulmonary vascular congestion are seen elsewhere. No pneumothorax or effusion. Cardiomediastinal contours are similar to priors accounting for differences in technique. The aorta is calcified. The remaining  cardiomediastinal contours are unremarkable. No acute osseous or soft tissue abnormality. Degenerative changes are present in the imaged spine and shoulders. IMPRESSION: 1. Endotracheal tube tip low in the trachea, 2.8 cm from carina. Consider retraction 1-2 cm to the mid trachea. 2. Transesophageal tube tip and side port distal to the GE junction, beyond the margins of imaging. 3. Dense opacity within the right upper lobe, could reflect infection or asymmetric pulmonary edema with more diffuse interstitial edematous changes elsewhere in the lungs. Electronically Signed   By: Lovena Le M.D.   On: 06/19/2020 01:20   EEG adult  Result Date: 06/19/2020 Lora Havens, MD     06/19/2020 11:34 AM Patient Name: SHARITY FERRANTE MRN: [id number] Epilepsy Attending: Lora Havens Referring Physician/Provider: Rudene Re, PA Date: 06/19/2020 Duration: 25.53 mins Patient history: 60 year old female with history of epilepsy, noted to have an episode of seizure.  EEG to evaluate for seizures. Level of alertness: Awake, asleep AEDs during EEG study: None Technical aspects: This EEG study was done with scalp electrodes positioned according to the 10-20 International system of electrode placement. Electrical activity was acquired at a sampling rate of 500Hz  and reviewed with a high frequency filter of 70Hz  and a low frequency filter of 1Hz . EEG data were recorded continuously and digitally stored. Description: The posterior dominant rhythm consists of 9 Hz activity of moderate voltage (25-35 uV) seen predominantly in posterior head regions, symmetric and reactive to eye opening and eye closing. Sleep was characterized by vertex waves, sleep spindles (12 to 14 Hz), maximal frontocentral region.  Hyperventilation and photic stimulation were not performed.   IMPRESSION: This study is within normal limits. No seizures or epileptiform discharges were seen throughout the recording. Lora Havens   ECHOCARDIOGRAM  COMPLETE  Result Date: 06/21/2020    ECHOCARDIOGRAM REPORT   Patient Name:   [name] [name] Date of Exam: 06/21/2020 Medical Rec #:  [id number]         Height:       63.0 in Accession #:    [id number]        Weight:       174.2 lb Date of Birth:  13-May-1961  BSA:          1.823 m Patient Age:    59 years          BP:           146/77 mmHg Patient Gender: F                 HR:           90 bpm. Exam Location:  Inpatient Procedure: 2D Echo Indications:    congestive heart failure  History:        Patient has no prior history of Echocardiogram examinations.                 Pacemaker, Signs/Symptoms:Shortness of Breath; Risk                 Factors:Diabetes.  Sonographer:    Johny Chess Referring Phys: [id number] [place] IMPRESSIONS  1. Left ventricular ejection fraction, by estimation, is 40 to 45%. The left ventricle has mildly decreased function. The left ventricle demonstrates global hypokinesis. There is mild concentric left ventricular hypertrophy. Left ventricular diastolic parameters are consistent with Grade II diastolic dysfunction (pseudonormalization).  2. Right ventricular systolic function is normal. The right ventricular size is normal. There is moderately elevated pulmonary artery systolic pressure. The estimated right ventricular systolic pressure is 65.6 mmHg.  3. A small pericardial effusion is present.  4. The mitral valve is normal in structure. Trivial mitral valve regurgitation.  5. Tricuspid valve regurgitation is mild to moderate.  6. The aortic valve is tricuspid. Aortic valve regurgitation is not visualized. No aortic stenosis is present.  7. The inferior vena cava is dilated in size with >50% respiratory variability, suggesting right atrial pressure of 8 mmHg. Comparison(s): No prior Echocardiogram. FINDINGS  Left Ventricle: Left ventricular ejection fraction, by estimation, is 40 to 45%. The left ventricle has mildly decreased function. The left ventricle demonstrates global  hypokinesis. The left ventricular internal cavity size was normal in size. There is  mild concentric left ventricular hypertrophy. Left ventricular diastolic parameters are consistent with Grade II diastolic dysfunction (pseudonormalization). Right Ventricle: The right ventricular size is normal. No increase in right ventricular wall thickness. Right ventricular systolic function is normal. There is moderately elevated pulmonary artery systolic pressure. The tricuspid regurgitant velocity is 3.39 m/s, and with an assumed right atrial pressure of 8 mmHg, the estimated right ventricular systolic pressure is 81.2 mmHg. Left Atrium: Left atrial size was normal in size. Right Atrium: Right atrial size was normal in size. Pericardium: A small pericardial effusion is present. Mitral Valve: The mitral valve is normal in structure. Trivial mitral valve regurgitation. Tricuspid Valve: The tricuspid valve is grossly normal. Tricuspid valve regurgitation is mild to moderate. Aortic Valve: The aortic valve is tricuspid. Aortic valve regurgitation is not visualized. No aortic stenosis is present. Pulmonic Valve: The pulmonic valve was not well visualized. Pulmonic valve regurgitation is not visualized. Aorta: The aortic root and ascending aorta are structurally normal, with no evidence of dilitation. Venous: The inferior vena cava is dilated in size with greater than 50% respiratory variability, suggesting right atrial pressure of 8 mmHg. IAS/Shunts: The atrial septum is grossly normal. Additional Comments: A pacer wire is visualized.  LEFT VENTRICLE PLAX 2D LVIDd:         4.00 cm     Diastology LVIDs:         3.60 cm     LV e' medial:    5.98 cm/s LV PW:  1.20 cm     LV E/e' medial:  20.9 LV IVS:        1.00 cm     LV e' lateral:   4.90 cm/s LVOT diam:     1.80 cm     LV E/e' lateral: 25.5 LV SV:         42 LV SV Index:   23 LVOT Area:     2.54 cm  LV Volumes (MOD) LV vol d, MOD A2C: 89.5 ml LV vol s, MOD A2C: 57.1 ml LV  SV MOD A2C:     32.4 ml RIGHT VENTRICLE             IVC RV S prime:     12.30 cm/s  IVC diam: 2.10 cm TAPSE (M-mode): 1.7 cm LEFT ATRIUM             Index       RIGHT ATRIUM           Index LA diam:        3.50 cm 1.92 cm/m  RA Area:     10.00 cm LA Vol (A2C):   55.1 ml 30.22 ml/m RA Volume:   19.90 ml  10.91 ml/m LA Vol (A4C):   41.3 ml 22.65 ml/m LA Biplane Vol: 48.6 ml 26.66 ml/m  AORTIC VALVE LVOT Vmax:   86.60 cm/s LVOT Vmean:  58.300 cm/s LVOT VTI:    0.166 m  AORTA Ao Root diam: 2.70 cm Ao Asc diam:  2.90 cm MV E velocity: 125.00 cm/s  TRICUSPID VALVE MV A velocity: 114.00 cm/s  TR Peak grad:   46.0 mmHg MV E/A ratio:  1.10         TR Vmax:        339.00 cm/s                              SHUNTS                             Systemic VTI:  0.17 m                             Systemic Diam: 1.80 cm Rudean Haskell MD Electronically signed by Rudean Haskell MD Signature Date/Time: 06/21/2020/4:10:56 PM    Final      Assessment and Plan:   1. Acute systolic heart failure - Echocardiogram today showed LV function of 40 to 45%, global hypokinesis, mild LVH and grade 2 diastolic dysfunction.  Normal RV function.  Moderately elevated pulmonary artery systolic pressure.  Small pericardial effusion.  Mild to moderate tricuspid regurgitation. -Likely low EF secondary to acute aspiration. -She does have a cardiac risk factors including hypertension, diabetes and ongoing tobacco smoking for underlying CAD.  May require outpatient stress test. -Start carvedilol 6.25 mg twice daily -Losartan 12.5 mg daily  2.  Tachycardia -Heart rate stable at 90s but at times close to up to 130s.  Seems underlying heart rhythm is sinus tachycardia but will get EKG when tachycardic. -IV metoprolol 5 mg x 1  3. acute hypoxic respiratory failure -Per primary team  4. AKI -Resolved -Watch renal function closely on ARB  5. Hypertension -No antihypertensive listed on home regimen -Patient is hypertensive at  194/90s during my evaluation -Start medication as above  6. Tobacco smoking - Recommended cessation.  New York Heart Association (NYHA) Functional Class NYHA Class I {      For questions or updates, please contact [facility] Please consult [email or web address] for contact info under    Jarrett Soho, [place]  06/21/2020 4:19 PM   Attending Note:   The patient was seen and examined.  Agree with assessment and plan as noted above.  Changes made to the above note as needed.  Patient seen and independently examined with  Robbie Lis, PA .   We discussed all aspects of the encounter. I agree with the assessment and plan as stated above.  1.  Respiratory distress:    Pt has gone into severe respiratory distress.  Profound expiratory wheezing .   I have called PCCM who is on the way over .  She will need BIPAP or intubation urgently . Getting a ABG   She was intubated when she was admitted.   Was extubated 1-2 days ago  Has had progressive dyspnea since then   PCCM arrived- She was urgently intubated and placed on a vent Its not clear at this time what exactly caused the intense , severe bronchospasm. She did receive IV metoprolol 5 mg  but I doubt this caused the bronchospams Discussed with Dr. Tamala Julian of PCCM, we can continue Coreg 6.25 BID   2.  Chronic combined CHF EF 40-45%. Grade 2 DD  She has moderate pulmonary HTN with est PA pressures of 54 mmHg.  This willl need some additional work up    I have spent a total of 40 minutes with patient reviewing hospital  notes , telemetry, EKGs, labs and examining patient as well as establishing an assessment and plan that was discussed with the patient. > 50% of time was spent in direct patient care.  Patients brother - Randall Hiss is listed as primary  contact  Also has a sister, Lorna Few [phone] who wants to be kept in the loop   Thayer Headings, Brooke Bonito., MD, [facility] 06/21/2020, 6:28 PM [address]  [place] Pager [phone]

## 2020-06-21 NOTE — Progress Notes (Signed)
NAME:  [name] [name], MRN:  [id number], DOB:  [birth year], LOS: 2 ADMISSION DATE:  06/19/2020, CONSULTATION DATE: 06/19/2020 REFERRING MD:  Orpah Greek, MD, CHIEF COMPLAINT: Shortness of breath and altered mental status  Brief History:  60 year old female with unknown past medical history who was brought into the emergency department by EMS with shortness of breath, cough.  Apparently had 1 episode of seizure, intubated on mechanical ventilation  History of Present Illness:  Patient is intubated and sedated so most of the history is taken from chart review and limited information from patient's sister  60 year old female with unknown past medical history who was brought into the emergency department with a complaint of shortness of breath, cough and hypoxemia.  As per patient's sister she has not been feeling well for the last few days because of cough and nasal congestion, she took cough syrup before going into bed, after few hours she was noted to be having difficulty breathing so EMS was summoned, upon EMS arrival patient was noted to be hypoxic into 60s, she was started on bag valve ventilation and was brought into the emergency department in the ED patient apparently had left-sided shaking and then became unresponsive.  She was intubated and was placed on mechanical ventilation.  Past Medical History:  Seizure disorder Possible diabetes type 2  Significant Hospital Events:    Consults:  PCCM  Procedures:  ETT 1/12 > anticipate 1/13  Significant Diagnostic Tests:  1/12 CT head: No acute intracranial abnormality. 1/12 chest x-ray: Dense right upper lobe infiltrate/opacity  Micro Data:  1/12 influenza/COVID PCR negative  Antimicrobials:  Unasyn 1/12->1/13  Interim History / Subjective:   1/13: awake and following commands on sbt. Placed on 5/5 with good results. Resume home meds. Afebrile, wbc down, stopping abx 1/12: admitted this am. On vent and propofol.  Awaiting eeg will attempt to change sedation. Family at bedside andprovided some med history. No h/o dm so  06/21/2020: Called back to see patient by cardiology service.  Patient in respiratory failure at bedside.  Patient wheezing.  Placed on 15 L O2 sats in the high 80s low 90s.  Intolerant of BiPAP due to claustrophobia concern for need of intubation.  Objective   Blood pressure (!) 162/105, pulse (!) 101, temperature 99.8 F (37.7 C), temperature source Oral, resp. rate (!) 37, height 5\' 3"  (1.6 m), weight 79 kg, SpO2 94 %.        Intake/Output Summary (Last 24 hours) at 06/21/2020 1843 Last data filed at 06/21/2020 1600 Gross per 24 hour  Intake 1987.05 ml  Output 2951 ml  Net -963.95 ml   Filed Weights   06/19/20 0101 06/20/20 0441 06/21/20 0600  Weight: 86.2 kg 83.5 kg 79 kg    Examination:   Physical exam: General: Elderly female obese, anxious HEENT: NCAT Neuro: Alert, oriented following commands Chest: Wheezing bilaterally, minimal air movement, using respiratory accessory muscles Heart: Tachycardic, regular rhythm, S1-S2 Abdomen: Soft, nontender nondistended Skin: No rash  Resolved Hospital Problem list     Assessment & Plan:    Acute hypoxic/hypercapnic respiratory failure likely due to aspiration pneumonia Possible acute exacerbation of underlying COPD, smoker No PFTs on file Acute bronchospasm CXR with RLL infiltrate  Plan: Solu-Medrol 40 mg every 12 hours Brovana, Pulmicort, Yupelri Levalbuterol as needed Continue Unasyn Adult mechanical vent support. Orders placed.  Sedation needs well mechanical vent Plan: Propofol Fentanyl PAD guidelines  Possible seizure activity with known sz hx Plan: Prior EEG without seizure Continue  home meds Lamictal plus ox Cabeza pain  Hyperglycemic on presentation -Continue to observe with CBGs  Acute kidney injury Lactic acidosis Plan: Continue to follow urine output BMP  Acute metabolic/toxic  encephalopathy -resolved   Best practice (evaluated daily)  Diet: advance as tolerated Pain/Anxiety/Delirium protocol (if indicated):  VAP protocol (if indicated):  DVT prophylaxis: Subcu heparin GI prophylaxis:  Glucose control: Insulin sq Mobility: as tolerated once out of icu Disposition: ICU   Goals of Care:  Last date of multidisciplinary goals of care discussion: Sister at bedside 06/21/2020 Family and staff present: pharmacy, self, sister, RN Summary of discussion: full code Follow up goals of care discussion due: 1/19 Code Status: Full code  Labs   CBC: Recent Labs  Lab 06/19/20 0110 06/19/20 0240 06/19/20 0353 06/19/20 0847 06/21/20 0231  WBC 18.6*  --   --  9.5 14.1*  NEUTROABS 5.2  --   --   --   --   HGB 15.0 14.3 11.9* 11.0* 11.4*  HCT 49.8* 42.0 35.0* 33.8* 35.6*  MCV 100.6*  --   --  96.0 93.9  PLT 266  --   --  166 938    Basic Metabolic Panel: Recent Labs  Lab 06/19/20 0304 06/19/20 0332 06/19/20 0353 06/19/20 0847 06/19/20 1302 06/20/20 1941 06/21/20 0231  NA  --  142 140 140 139 140 138  K  --  3.3* 3.7 4.8 4.5 4.6 3.3*  CL  --  111  --  108 110 104 100  CO2  --  19*  --  19* 16* 22 25  GLUCOSE  --  132*  --  113* 119* 97 92  BUN  --  7  --  8 10 13 11   CREATININE  --  1.05*  --  1.23* 1.41* 1.21* 1.09*  CALCIUM  --  7.9*  --  7.7* 7.9* 8.4* 8.5*  MG 1.9  --   --   --   --   --   --   PHOS 4.3  --   --   --   --   --   --    GFR: Estimated Creatinine Clearance: 54.6 mL/min (A) (by C-G formula based on SCr of 1.09 mg/dL (H)). Recent Labs  Lab 06/19/20 0110 06/19/20 0847 06/19/20 1050 06/21/20 0231 06/21/20 1200  PROCALCITON  --   --   --   --  1.03  WBC 18.6* 9.5  --  14.1*  --   LATICACIDVEN >11.0* 2.8* 2.2*  --   --     Liver Function Tests: Recent Labs  Lab 06/19/20 0110  AST 28  ALT 18  ALKPHOS 153*  BILITOT 0.1*  PROT 6.7  ALBUMIN 3.5   No results for input(s): LIPASE, AMYLASE in the last 168 hours. Recent Labs   Lab 06/19/20 0110  AMMONIA 147*    ABG    Component Value Date/Time   PHART 7.257 (L) 06/19/2020 0353   PCO2ART 48.3 (H) 06/19/2020 0353   PO2ART 148 (H) 06/19/2020 0353   HCO3 21.7 06/19/2020 0353   TCO2 23 06/19/2020 0353   ACIDBASEDEF 6.0 (H) 06/19/2020 0353   O2SAT 99.0 06/19/2020 0353     Coagulation Profile: No results for input(s): INR, PROTIME in the last 168 hours.  Cardiac Enzymes: Recent Labs  Lab 06/20/20 1941 06/21/20 0231  CKTOTAL 169 97    HbA1C: Hgb A1c MFr Bld  Date/Time Value Ref Range Status  06/19/2020 08:47 AM 4.9 4.8 - 5.6 %  Final    Comment:    (NOTE) Pre diabetes:          5.7%-6.4%  Diabetes:              >6.4%  Glycemic control for   <7.0% adults with diabetes     CBG: Recent Labs  Lab 06/20/20 2250 06/21/20 0641 06/21/20 0657 06/21/20 1129 06/21/20 1559  GLUCAP 94 77 88 86 95    This patient is critically ill with multiple organ system failure; which, requires frequent high complexity decision making, assessment, support, evaluation, and titration of therapies. This was completed through the application of advanced monitoring technologies and extensive interpretation of multiple databases. During this encounter critical care time was devoted to patient care services described in this note for 32 minutes.  [facility] 06/21/2020 6:43 PM

## 2020-06-21 NOTE — Progress Notes (Signed)
Patient has tolerated sips of water with med's and ice chips throughout the night. CBG was 77 this morning. Patient given cranberry juice. Will continue to monitor and recheck CBG.

## 2020-06-22 ENCOUNTER — Other Ambulatory Visit: Payer: Self-pay

## 2020-06-22 ENCOUNTER — Inpatient Hospital Stay (HOSPITAL_COMMUNITY): Payer: Medicare HMO

## 2020-06-22 ENCOUNTER — Encounter (HOSPITAL_COMMUNITY): Payer: Self-pay | Admitting: Internal Medicine

## 2020-06-22 DIAGNOSIS — J9601 Acute respiratory failure with hypoxia: Secondary | ICD-10-CM | POA: Diagnosis not present

## 2020-06-22 DIAGNOSIS — Z950 Presence of cardiac pacemaker: Secondary | ICD-10-CM | POA: Diagnosis not present

## 2020-06-22 DIAGNOSIS — R569 Unspecified convulsions: Secondary | ICD-10-CM | POA: Diagnosis not present

## 2020-06-22 DIAGNOSIS — I420 Dilated cardiomyopathy: Secondary | ICD-10-CM | POA: Diagnosis not present

## 2020-06-22 DIAGNOSIS — J9602 Acute respiratory failure with hypercapnia: Secondary | ICD-10-CM | POA: Diagnosis not present

## 2020-06-22 LAB — CULTURE, RESPIRATORY W GRAM STAIN: Culture: NORMAL

## 2020-06-22 LAB — CBC
HCT: 33.7 % — ABNORMAL LOW (ref 36.0–46.0)
Hemoglobin: 11.1 g/dL — ABNORMAL LOW (ref 12.0–15.0)
MCH: 30.2 pg (ref 26.0–34.0)
MCHC: 32.9 g/dL (ref 30.0–36.0)
MCV: 91.8 fL (ref 80.0–100.0)
Platelets: 142 10*3/uL — ABNORMAL LOW (ref 150–400)
RBC: 3.67 MIL/uL — ABNORMAL LOW (ref 3.87–5.11)
RDW: 13.2 % (ref 11.5–15.5)
WBC: 11.9 10*3/uL — ABNORMAL HIGH (ref 4.0–10.5)
nRBC: 0 % (ref 0.0–0.2)

## 2020-06-22 LAB — GLUCOSE, CAPILLARY
Glucose-Capillary: 112 mg/dL — ABNORMAL HIGH (ref 70–99)
Glucose-Capillary: 113 mg/dL — ABNORMAL HIGH (ref 70–99)
Glucose-Capillary: 117 mg/dL — ABNORMAL HIGH (ref 70–99)
Glucose-Capillary: 122 mg/dL — ABNORMAL HIGH (ref 70–99)
Glucose-Capillary: 153 mg/dL — ABNORMAL HIGH (ref 70–99)
Glucose-Capillary: 98 mg/dL (ref 70–99)

## 2020-06-22 LAB — BRAIN NATRIURETIC PEPTIDE: B Natriuretic Peptide: 477.8 pg/mL — ABNORMAL HIGH (ref 0.0–100.0)

## 2020-06-22 LAB — BASIC METABOLIC PANEL
Anion gap: 13 (ref 5–15)
BUN: 13 mg/dL (ref 6–20)
CO2: 23 mmol/L (ref 22–32)
Calcium: 8.6 mg/dL — ABNORMAL LOW (ref 8.9–10.3)
Chloride: 103 mmol/L (ref 98–111)
Creatinine, Ser: 1.17 mg/dL — ABNORMAL HIGH (ref 0.44–1.00)
GFR, Estimated: 53 mL/min — ABNORMAL LOW (ref >60)
Glucose, Bld: 113 mg/dL — ABNORMAL HIGH (ref 70–99)
Potassium: 3.8 mmol/L (ref 3.5–5.1)
Sodium: 139 mmol/L (ref 135–145)

## 2020-06-22 LAB — TRIGLYCERIDES: Triglycerides: 188 mg/dL — ABNORMAL HIGH (ref <150)

## 2020-06-22 LAB — PROCALCITONIN: Procalcitonin: 1.15 ng/mL

## 2020-06-22 MED ORDER — MONTELUKAST SODIUM 10 MG PO TABS
10.0000 mg | ORAL_TABLET | Freq: Every day | ORAL | Status: DC
  Administered 2020-06-22: 10 mg
  Filled 2020-06-22: qty 1

## 2020-06-22 MED ORDER — LAMOTRIGINE 25 MG PO TABS
50.0000 mg | ORAL_TABLET | Freq: Every day | ORAL | Status: DC
  Administered 2020-06-22: 50 mg
  Filled 2020-06-22 (×2): qty 2

## 2020-06-22 MED ORDER — ORAL CARE MOUTH RINSE
15.0000 mL | OROMUCOSAL | Status: DC
  Administered 2020-06-22 – 2020-06-23 (×7): 15 mL via OROMUCOSAL

## 2020-06-22 MED ORDER — MONTELUKAST SODIUM 10 MG PO TABS: 10.0000 mg | ORAL_TABLET | Freq: Every day | ORAL | Status: DC

## 2020-06-22 MED ORDER — PROSOURCE TF PO LIQD
90.0000 mL | Freq: Three times a day (TID) | ORAL | Status: DC
  Administered 2020-06-22 – 2020-06-23 (×3): 90 mL
  Filled 2020-06-22 (×4): qty 90

## 2020-06-22 MED ORDER — CHLORHEXIDINE GLUCONATE 0.12% ORAL RINSE (MEDLINE KIT)
15.0000 mL | Freq: Two times a day (BID) | OROMUCOSAL | Status: DC
  Administered 2020-06-22 – 2020-06-23 (×2): 15 mL via OROMUCOSAL

## 2020-06-22 MED ORDER — CARVEDILOL 6.25 MG PO TABS
6.2500 mg | ORAL_TABLET | Freq: Two times a day (BID) | ORAL | Status: DC
  Administered 2020-06-22 – 2020-06-23 (×3): 6.25 mg
  Filled 2020-06-22 (×3): qty 1

## 2020-06-22 MED ORDER — PANTOPRAZOLE SODIUM 40 MG PO TBEC: 40.0000 mg | DELAYED_RELEASE_TABLET | Freq: Every day | ORAL | Status: DC

## 2020-06-22 MED ORDER — LORATADINE 10 MG PO TABS
10.0000 mg | ORAL_TABLET | Freq: Every day | ORAL | Status: DC
  Administered 2020-06-22 – 2020-06-26 (×5): 10 mg via ORAL
  Filled 2020-06-22 (×5): qty 1

## 2020-06-22 MED ORDER — HYDROXYZINE HCL 10 MG PO TABS
10.0000 mg | ORAL_TABLET | Freq: Three times a day (TID) | ORAL | Status: DC
  Filled 2020-06-22: qty 1

## 2020-06-22 MED ORDER — VITAL AF 1.2 CAL PO LIQD
1000.0000 mL | ORAL | Status: DC
  Administered 2020-06-22: 1000 mL

## 2020-06-22 MED ORDER — PANTOPRAZOLE SODIUM 40 MG PO PACK
40.0000 mg | PACK | Freq: Every day | ORAL | Status: DC
  Administered 2020-06-22 – 2020-06-23 (×2): 40 mg
  Filled 2020-06-22 (×2): qty 20

## 2020-06-22 MED ORDER — LAMOTRIGINE 100 MG PO TABS
200.0000 mg | ORAL_TABLET | Freq: Two times a day (BID) | ORAL | Status: DC
  Administered 2020-06-22 – 2020-06-23 (×3): 200 mg
  Filled 2020-06-22 (×4): qty 2

## 2020-06-22 MED ORDER — ESCITALOPRAM OXALATE 10 MG PO TABS
20.0000 mg | ORAL_TABLET | Freq: Every day | ORAL | Status: DC
  Administered 2020-06-22: 20 mg
  Filled 2020-06-22: qty 2

## 2020-06-22 MED ORDER — PROSOURCE TF PO LIQD
45.0000 mL | Freq: Two times a day (BID) | ORAL | Status: DC
  Administered 2020-06-22: 45 mL
  Filled 2020-06-22: qty 45

## 2020-06-22 MED ORDER — OXCARBAZEPINE 300 MG PO TABS
300.0000 mg | ORAL_TABLET | Freq: Two times a day (BID) | ORAL | Status: DC
  Administered 2020-06-22 – 2020-06-23 (×3): 300 mg
  Filled 2020-06-22 (×4): qty 1

## 2020-06-22 MED ORDER — ESCITALOPRAM OXALATE 10 MG PO TABS: 20.0000 mg | ORAL_TABLET | Freq: Every day | ORAL | Status: DC

## 2020-06-22 MED ORDER — HYDROXYZINE HCL 10 MG PO TABS
10.0000 mg | ORAL_TABLET | Freq: Three times a day (TID) | ORAL | Status: DC
  Administered 2020-06-22 – 2020-06-23 (×5): 10 mg
  Filled 2020-06-22 (×7): qty 1

## 2020-06-22 MED ORDER — INSULIN ASPART 100 UNIT/ML ~~LOC~~ SOLN
0.0000 [IU] | SUBCUTANEOUS | Status: DC
  Administered 2020-06-22: 2 [IU] via SUBCUTANEOUS
  Administered 2020-06-23: 1 [IU] via SUBCUTANEOUS
  Administered 2020-06-23: 2 [IU] via SUBCUTANEOUS
  Administered 2020-06-23 – 2020-06-24 (×3): 1 [IU] via SUBCUTANEOUS

## 2020-06-22 MED ORDER — FUROSEMIDE 10 MG/ML IJ SOLN
40.0000 mg | Freq: Four times a day (QID) | INTRAMUSCULAR | Status: AC
  Administered 2020-06-22 (×2): 40 mg via INTRAVENOUS
  Filled 2020-06-22 (×2): qty 4

## 2020-06-22 MED ORDER — FLUTICASONE PROPIONATE 50 MCG/ACT NA SUSP
2.0000 | Freq: Every day | NASAL | Status: DC
  Administered 2020-06-24 – 2020-06-25 (×2): 2 via NASAL
  Filled 2020-06-22: qty 16

## 2020-06-22 MED ORDER — VITAL HIGH PROTEIN PO LIQD: 1000.0000 mL | ORAL | Status: DC

## 2020-06-22 MED ORDER — POTASSIUM CHLORIDE 20 MEQ PO PACK
40.0000 meq | PACK | Freq: Two times a day (BID) | ORAL | Status: AC
  Administered 2020-06-22 (×2): 40 meq
  Filled 2020-06-22 (×2): qty 2

## 2020-06-22 NOTE — Progress Notes (Signed)
Primary Cardiologist:  [name] Subjective:  Intubated spoke with brother at bedside   Objective:  Vitals:   06/22/20 0500 06/22/20 0600 06/22/20 0700 06/22/20 0737  BP: (!) 119/47 (!) 115/49 (!) 122/57   Pulse: 60 60 64   Resp: (!) 22 18 16    Temp:    98 F (36.7 C)  TempSrc:    Axillary  SpO2: 100% 97% 99%   Weight:      Height:        Intake/Output from previous day:  Intake/Output Summary (Last 24 hours) at 06/22/2020 0801 Last data filed at 06/22/2020 0700 Gross per 24 hour  Intake 1090.01 ml  Output 1606 ml  Net -515.99 ml    Physical Exam: Intubated PPM under left clavicle Bilateral rhonchi No murmur  Abdomen soft Trace edeam  Lab Results: Basic Metabolic Panel: Recent Labs    06/21/20 0231 06/21/20 2018 06/22/20 0105  NA 138 140 139  K 3.3* 3.5 3.8  CL 100  --  103  CO2 25  --  23  GLUCOSE 92  --  113*  BUN 11  --  13  CREATININE 1.09*  --  1.17*  CALCIUM 8.5*  --  8.6*   Liver Function Tests: No results for input(s): AST, ALT, ALKPHOS, BILITOT, PROT, ALBUMIN in the last 72 hours. No results for input(s): LIPASE, AMYLASE in the last 72 hours. CBC: Recent Labs    06/21/20 0231 06/21/20 2018 06/22/20 0105  WBC 14.1*  --  11.9*  HGB 11.4* 10.2* 11.1*  HCT 35.6* 30.0* 33.7*  MCV 93.9  --  91.8  PLT 157  --  142*   Cardiac Enzymes: Recent Labs    06/20/20 1941 06/21/20 0231  CKTOTAL 169 97   BNP: Invalid input(s): POCBNP D-Dimer: No results for input(s): DDIMER in the last 72 hours. Hemoglobin A1C: Recent Labs    06/19/20 0847  HGBA1C 4.9   Fasting Lipid Panel: Recent Labs    06/22/20 0105  TRIG 188*   Thyroid Function Tests: No results for input(s): TSH, T4TOTAL, T3FREE, THYROIDAB in the last 72 hours.  Invalid input(s): FREET3 Anemia Panel: No results for input(s): VITAMINB12, FOLATE, FERRITIN, TIBC, IRON, RETICCTPCT in the last 72 hours.  Imaging: DG Abd 1 View  Result Date: 06/20/2020 CLINICAL DATA:   Vomiting EXAM: ABDOMEN - 1 VIEW COMPARISON:  None. FINDINGS: The bowel gas pattern is normal. No radio-opaque calculi or other significant radiographic abnormality are seen. IMPRESSION: No evidence of bowel obstruction. Electronically Signed   By: Dahlia Bailiff MD   On: 06/20/2020 17:38   DG CHEST PORT 1 VIEW  Result Date: 06/22/2020 CLINICAL DATA:  Endotracheal tube placement EXAM: PORTABLE CHEST 1 VIEW COMPARISON:  June 21, 2020 FINDINGS: The endotracheal tube terminates above the carina by approximately 2.9 cm. There is a dual chamber left-sided pacemaker in place. The enteric tube extends below the left hemidiaphragm and terminates over the gastric body. There is a dense retrocardiac opacity which has worsened since the prior study. There are bibasilar airspace opacities, right worse than left which have worsened since the prior study. There is a growing right-sided pleural effusion. There is no pneumothorax. IMPRESSION: 1. Lines and tubes as above. 2. Worsening aeration bilaterally. Electronically Signed   By: Constance Holster M.D.   On: 06/22/2020 00:49   DG Chest Port 1 View  Result Date: 06/21/2020 CLINICAL DATA:  Hypoxia.  Recent congestion. EXAM: PORTABLE CHEST 1 VIEW COMPARISON:  06/20/2020 FINDINGS: Left chest  wall pacer device is noted with leads in the right atrial appendage and right ventricle. The heart size is normal. Small pleural effusions and pulmonary vascular congestion is again noted. Interval increased opacification of the right lower lobe. IMPRESSION: 1. Stable small bilateral pleural effusions and pulmonary vascular congestion. 2. Worsening aeration to the right lower lobe. Electronically Signed   By: Kerby Moors M.D.   On: 06/21/2020 12:15   DG CHEST PORT 1 VIEW  Result Date: 06/20/2020 CLINICAL DATA:  60 year old female with acute respiratory failure. EXAM: PORTABLE CHEST 1 VIEW COMPARISON:  Chest radiograph dated 06/19/2020. FINDINGS: There is borderline cardiomegaly  with vascular congestion and mild edema. Pneumonia is not excluded. Clinical correlation is recommended. Trace pleural effusions may be present. No pneumothorax. Left pectoral pacemaker device. No acute osseous pathology. IMPRESSION: Borderline cardiomegaly with findings of CHF. Pneumonia is not excluded. Electronically Signed   By: Anner Crete M.D.   On: 06/20/2020 16:26   ECHOCARDIOGRAM COMPLETE  Result Date: 06/21/2020    ECHOCARDIOGRAM REPORT   Patient Name:   [name] [name] Date of Exam: 06/21/2020 Medical Rec #:  [id number]         Height:       63.0 in Accession #:    [id number]        Weight:       174.2 lb Date of Birth:  11/17/1960         BSA:          1.823 m Patient Age:    60 years          BP:           146/77 mmHg Patient Gender: F                 HR:           90 bpm. Exam Location:  Inpatient Procedure: 2D Echo Indications:    congestive heart failure  History:        Patient has no prior history of Echocardiogram examinations.                 Pacemaker, Signs/Symptoms:Shortness of Breath; Risk                 Factors:Diabetes.  Sonographer:    Johny Chess Referring Phys: [id number] [place] IMPRESSIONS  1. Left ventricular ejection fraction, by estimation, is 40 to 45%. The left ventricle has mildly decreased function. The left ventricle demonstrates global hypokinesis. There is mild concentric left ventricular hypertrophy. Left ventricular diastolic parameters are consistent with Grade II diastolic dysfunction (pseudonormalization).  2. Right ventricular systolic function is normal. The right ventricular size is normal. There is moderately elevated pulmonary artery systolic pressure. The estimated right ventricular systolic pressure is 50.0 mmHg.  3. A small pericardial effusion is present.  4. The mitral valve is normal in structure. Trivial mitral valve regurgitation.  5. Tricuspid valve regurgitation is mild to moderate.  6. The aortic valve is tricuspid. Aortic valve  regurgitation is not visualized. No aortic stenosis is present.  7. The inferior vena cava is dilated in size with >50% respiratory variability, suggesting right atrial pressure of 8 mmHg. Comparison(s): No prior Echocardiogram. FINDINGS  Left Ventricle: Left ventricular ejection fraction, by estimation, is 40 to 45%. The left ventricle has mildly decreased function. The left ventricle demonstrates global hypokinesis. The left ventricular internal cavity size was normal in size. There is  mild concentric left ventricular hypertrophy. Left ventricular diastolic parameters are  consistent with Grade II diastolic dysfunction (pseudonormalization). Right Ventricle: The right ventricular size is normal. No increase in right ventricular wall thickness. Right ventricular systolic function is normal. There is moderately elevated pulmonary artery systolic pressure. The tricuspid regurgitant velocity is 3.39 m/s, and with an assumed right atrial pressure of 8 mmHg, the estimated right ventricular systolic pressure is 0000000 mmHg. Left Atrium: Left atrial size was normal in size. Right Atrium: Right atrial size was normal in size. Pericardium: A small pericardial effusion is present. Mitral Valve: The mitral valve is normal in structure. Trivial mitral valve regurgitation. Tricuspid Valve: The tricuspid valve is grossly normal. Tricuspid valve regurgitation is mild to moderate. Aortic Valve: The aortic valve is tricuspid. Aortic valve regurgitation is not visualized. No aortic stenosis is present. Pulmonic Valve: The pulmonic valve was not well visualized. Pulmonic valve regurgitation is not visualized. Aorta: The aortic root and ascending aorta are structurally normal, with no evidence of dilitation. Venous: The inferior vena cava is dilated in size with greater than 50% respiratory variability, suggesting right atrial pressure of 8 mmHg. IAS/Shunts: The atrial septum is grossly normal. Additional Comments: A pacer wire is  visualized.  LEFT VENTRICLE PLAX 2D LVIDd:         4.00 cm     Diastology LVIDs:         3.60 cm     LV e' medial:    5.98 cm/s LV PW:         1.20 cm     LV E/e' medial:  20.9 LV IVS:        1.00 cm     LV e' lateral:   4.90 cm/s LVOT diam:     1.80 cm     LV E/e' lateral: 25.5 LV SV:         42 LV SV Index:   23 LVOT Area:     2.54 cm  LV Volumes (MOD) LV vol d, MOD A2C: 89.5 ml LV vol s, MOD A2C: 57.1 ml LV SV MOD A2C:     32.4 ml RIGHT VENTRICLE             IVC RV S prime:     12.30 cm/s  IVC diam: 2.10 cm TAPSE (M-mode): 1.7 cm LEFT ATRIUM             Index       RIGHT ATRIUM           Index LA diam:        3.50 cm 1.92 cm/m  RA Area:     10.00 cm LA Vol (A2C):   55.1 ml 30.22 ml/m RA Volume:   19.90 ml  10.91 ml/m LA Vol (A4C):   41.3 ml 22.65 ml/m LA Biplane Vol: 48.6 ml 26.66 ml/m  AORTIC VALVE LVOT Vmax:   86.60 cm/s LVOT Vmean:  58.300 cm/s LVOT VTI:    0.166 m  AORTA Ao Root diam: 2.70 cm Ao Asc diam:  2.90 cm MV E velocity: 125.00 cm/s  TRICUSPID VALVE MV A velocity: 114.00 cm/s  TR Peak grad:   46.0 mmHg MV E/A ratio:  1.10         TR Vmax:        339.00 cm/s                              SHUNTS  Systemic VTI:  0.17 m                             Systemic Diam: 1.80 cm Rudean Haskell MD Electronically signed by Rudean Haskell MD Signature Date/Time: 06/21/2020/4:10:56 PM    Final     Cardiac Studies:  ECG: Pacing LBBB   Telemetry:AV pacing   Echo: EF 40-45%   Medications:   . arformoterol  15 mcg Nebulization BID  . budesonide (PULMICORT) nebulizer solution  0.25 mg Nebulization BID  . carvedilol  6.25 mg Oral BID WC  . Chlorhexidine Gluconate Cloth  6 each Topical Daily  . docusate  100 mg Per Tube BID  . heparin  5,000 Units Subcutaneous Q8H  . insulin aspart  0-9 Units Subcutaneous TID WC  . lamoTRIgine  200 mg Oral BID   And  . lamoTRIgine  50 mg Oral QHS  . losartan  12.5 mg Oral Daily  . methylPREDNISolone (SOLU-MEDROL) injection  40  mg Intravenous Q12H  . OXcarbazepine  300 mg Oral BID  . polyethylene glycol  17 g Per Tube Daily  . revefenacin  175 mcg Nebulization Daily  . rocuronium  80 mg Intravenous Once  . sodium chloride flush  10-40 mL Intracatheter Q12H     . sodium chloride Stopped (06/19/20 2317)  . ampicillin-sulbactam (UNASYN) IV Stopped (06/22/20 0238)  . fentaNYL infusion INTRAVENOUS 50 mcg/hr (06/22/20 0700)  . propofol (DIPRIVAN) infusion 45 mcg/kg/min (06/22/20 0700)    Assessment/Plan:   1. DCM:  Admitted with respiratory distress and aspiration. CXR worse this am BNP only 477. No evidence of acute Ischemic event. Continue coreg and losartan Lasix as needed to keep I/O's negative Outpatient evaluation planned  2. Aspiration pneumonia: Intubated x 2 with severe bronchospasm CXR worsening bilateral infiltrates this am ? ARDS Continue vent settings , Unasyn , steroids  and sedation per CCM   3. PPM:  Normal function   Jenkins Rouge 06/22/2020, 8:01 AM

## 2020-06-22 NOTE — Progress Notes (Signed)
Initial Nutrition Assessment  DOCUMENTATION CODES:   Obesity unspecified  INTERVENTION:   Initiate Vital AF 1.2 @ 25 ml/hr via OGT (657ml daily)  90 ml Prostat TID.    Tube feeding regimen provides 960 kcal, 111 grams of protein, and 487 ml of H2O.   Total TF regimen (including propofol) provides 1522 kcals daily.   NUTRITION DIAGNOSIS:   Inadequate oral intake related to inability to eat as evidenced by NPO status.  GOAL:   Patient will meet greater than or equal to 90% of their needs  MONITOR:   Vent status,Labs,Weight trends,TF tolerance,Skin,I & O's  REASON FOR ASSESSMENT:   Consult Assessment of nutrition requirement/status  ASSESSMENT:   60 year old female with unknown past medical history who was brought into the emergency department by EMS with shortness of breath, cough.  Apparently had 1 episode of seizure, intubated on mechanical ventilation  Pt admitted with acute hypoxic/ hypercapnic respiratory failure likely due to aspiration pneumonia.   1/12- intubated 1/13- extubated, emesis after drinking liquids, NPO, KUB without ileus 1/14- reintubated  Patient is currently intubated on ventilator support. OGT placement verified by x-ray.  MV: 7.9 L/min Temp (24hrs), Avg:98.5 F (36.9 C), Min:97.7 F (36.5 C), Max:99.8 F (37.7 C)  Propofol: 21.3 ml/hr (provides 562 kcals daily)  Reviewed I/O's: +49 ml x 24 hours and +376 ml since admission  UOP: 1.5 L x 24 hours  MAP: 76  Per MD notes, pt with severe bronchospasm and CXR with worsening bilateral infiltrates.   Medications reviewed and include colace, lasix, solu-medrol, and miralax.  Lab Results  Component Value Date   HGBA1C 4.9 06/19/2020   PTA DM medications are none.   Labs reviewed: CBGS: 98-116 (inpatient orders for glycemic control are 0-9 units insulin aspart TID with meals).   NUTRITION - FOCUSED PHYSICAL EXAM:  Flowsheet Row Most Recent Value  Orbital Region No depletion  Upper  Arm Region No depletion  Thoracic and Lumbar Region Unable to assess  Buccal Region Unable to assess  Temple Region No depletion  Clavicle Bone Region No depletion  Clavicle and Acromion Bone Region No depletion  Scapular Bone Region Unable to assess  Dorsal Hand No depletion  Patellar Region No depletion  Anterior Thigh Region No depletion  Posterior Calf Region No depletion  Edema (RD Assessment) Mild  Hair Reviewed  Eyes Unable to assess  Mouth Unable to assess  Skin Reviewed  Nails Unable to assess       Diet Order:   Diet Order            Diet clear liquid Room service appropriate? Yes; Fluid consistency: Thin  Diet effective now                 EDUCATION NEEDS:   Not appropriate for education at this time  Skin:  Skin Assessment: Reviewed RN Assessment  Last BM:  06/20/20  Height:   Ht Readings from Last 1 Encounters:  06/19/20 5\' 3"  (1.6 m)    Weight:   Wt Readings from Last 1 Encounters:  06/22/20 81.3 kg    Ideal Body Weight:  52.3 kg  BMI:  Body mass index is 31.75 kg/m.  Estimated Nutritional Needs:   Kcal:  299-2426  Protein:  105-120 grams  Fluid:  > 1.2 L    Loistine Chance, RD, LDN, [place] Registered Dietitian II Certified Diabetes Care and Education Specialist Please refer to [facility] for RD and/or RD on-call/weekend/after hours pager

## 2020-06-22 NOTE — Progress Notes (Signed)
NAME:  [name] [name], MRN:  [id number], DOB:  06-25-1960, LOS: 3 ADMISSION DATE:  06/19/2020, CONSULTATION DATE: 06/19/2020 REFERRING MD:  Orpah Greek, MD, CHIEF COMPLAINT: Shortness of breath and altered mental status  Brief History:  60 year old female with unknown past medical history who was brought into the emergency department by EMS with shortness of breath, cough.  Apparently had 1 episode of seizure, intubated on mechanical ventilation  History of Present Illness:  Patient is intubated and sedated so most of the history is taken from chart review and limited information from patient's sister  60 year old female with unknown past medical history who was brought into the emergency department with a complaint of shortness of breath, cough and hypoxemia.  As per patient's sister she has not been feeling well for the last few days because of cough and nasal congestion, she took cough syrup before going into bed, after few hours she was noted to be having difficulty breathing so EMS was summoned, upon EMS arrival patient was noted to be hypoxic into 60s, she was started on bag valve ventilation and was brought into the emergency department in the ED patient apparently had left-sided shaking and then became unresponsive.  She was intubated and was placed on mechanical ventilation.  Past Medical History:  Seizure disorder Possible diabetes type 2  Significant Hospital Events:    Consults:  PCCM  Procedures:  ETT 1/12 > anticipate 1/13  Significant Diagnostic Tests:  1/12 CT head: No acute intracranial abnormality. 1/12 chest x-ray: Dense right upper lobe infiltrate/opacity  Micro Data:  1/12 influenza/COVID PCR negative  Antimicrobials:  Unasyn 1/12->1/13  Interim History / Subjective:   Brother at bedside. No events, O2 needs improved.  Objective   Blood pressure (!) 126/59, pulse 66, temperature 98 F (36.7 C), temperature source Axillary, resp. rate 18,  height 5\' 3"  (1.6 m), weight 81.3 kg, SpO2 100 %.    Vent Mode: PRVC FiO2 (%):  [60 %-100 %] 60 % Set Rate:  [18 bmp-22 bmp] 18 bmp Vt Set:  [420 mL] 420 mL PEEP:  [10 cmH20] 10 cmH20 Plateau Pressure:  [14 cmH20-28 cmH20] 14 cmH20   Intake/Output Summary (Last 24 hours) at 06/22/2020 0859 Last data filed at 06/22/2020 0800 Gross per 24 hour  Intake 1116.39 ml  Output 1606 ml  Net -489.61 ml   Filed Weights   06/20/20 0441 06/21/20 0600 06/22/20 0239  Weight: 83.5 kg 79 kg 81.3 kg    Examination: Constitutional: no acute distress, intubated/sedated  Eyes: pupils pinpoint, reactive, not tracking Ears, nose, mouth, and throat: ETT in place, minimal secretions Cardiovascular: RRR, ext warm Respiratory: now clear, no wheezing Gastrointestinal: Soft, +BS Skin: No rashes, normal turgor Neurologic: heavily sedated but withdraws x 4 Psychiatric: RASS -5  Cr okay WBC improved Pct stable Plts stable CXR worsened lower lobe airspace disease   Resolved Hospital Problem list     Assessment & Plan:  Acute hypoxemic and hypercapneic respiratory failure- brother at bedside is helping clarify, apparently patient has done this before where she gets into coughing fits, panics, sister exacerbates panicking leading to respiratory distress.  She may very well have aspirated yesterday but what would also explain presentation (or at least have contributed) would be panic attack + VCD + progressive atelectasis.  Will tx as aspiration, bronchospasm, VCD, and panic disorder concurrently. - PPI, singulair, claritin, flonase, atarax, lexapro - IV Steroids and triple neb therapy (ICS/LABA/LAMA) - Unasyn x 5 days - Lasix x 2 -  Let rest today, brother suggests extubating tomorrow when sister will not be around to exacerbate anxiety - May take a look at pleura to see if any large contributing effusion - Needs SLP eval  Questionable seizure history- I do not see any objective evidence of this in her  past neurology notes (she has two MRNs in our system) including a hospitalization to epilepsy unit in which they tried to provoke these seizures.  Nonetheless she remains on AEDs.  Question somatization. - Continue AEDs, OP neuro f/u  CHB s/p pacemaker, dilated cardiomyopathy question stress related - GDMT per cardiology - Diureses as renal function tolerates  Best practice (evaluated daily)  Diet: TF Pain/Anxiety/Delirium protocol (if indicated):  VAP protocol (if indicated): in place DVT prophylaxis: Subcu heparin GI prophylaxis: PPI Glucose control: Insulin sq Mobility: BR Disposition: ICU   Goals of Care:  Last date of multidisciplinary goals of care discussion: Sister at bedside 06/21/2020 Family and staff present: pharmacy, self, sister, RN Summary of discussion: full code Follow up goals of care discussion due: 1/19 Code Status: Full code   Patient critically ill due to respiratory failure Interventions to address this today mechanical ventilation titration Risk of deterioration without these interventions is high  I personally spent 35 minutes providing critical care not including any separately billable procedures  Erskine Emery MD [facility] 06/22/2020 9:17 AM Personal pager: [phone] If unanswered, please page CCM On-call: [phone]

## 2020-06-22 NOTE — Progress Notes (Signed)
[facility] Progress Note Patient Name: [name] [name] DOB: Jul 11, 1960 MRN: [id number]   Date of Service  06/22/2020  HPI/Events of Note  Patient with respiratory alkalosis on ABG.  eICU Interventions  Respiratory rate reduced to 18 from 22. ABG will be ordered to be done at 7:30 a.m.        [name] [name] 06/22/2020, 5:28 AM

## 2020-06-23 ENCOUNTER — Inpatient Hospital Stay (HOSPITAL_COMMUNITY): Payer: Medicare HMO

## 2020-06-23 DIAGNOSIS — J9601 Acute respiratory failure with hypoxia: Secondary | ICD-10-CM | POA: Diagnosis not present

## 2020-06-23 LAB — BASIC METABOLIC PANEL
Anion gap: 14 (ref 5–15)
BUN: 28 mg/dL — ABNORMAL HIGH (ref 6–20)
CO2: 25 mmol/L (ref 22–32)
Calcium: 9 mg/dL (ref 8.9–10.3)
Chloride: 102 mmol/L (ref 98–111)
Creatinine, Ser: 1.13 mg/dL — ABNORMAL HIGH (ref 0.44–1.00)
GFR, Estimated: 56 mL/min — ABNORMAL LOW (ref >60)
Glucose, Bld: 145 mg/dL — ABNORMAL HIGH (ref 70–99)
Potassium: 3.7 mmol/L (ref 3.5–5.1)
Sodium: 141 mmol/L (ref 135–145)

## 2020-06-23 LAB — GLUCOSE, CAPILLARY
Glucose-Capillary: 139 mg/dL — ABNORMAL HIGH (ref 70–99)
Glucose-Capillary: 123 mg/dL — ABNORMAL HIGH (ref 70–99)
Glucose-Capillary: 110 mg/dL — ABNORMAL HIGH (ref 70–99)
Glucose-Capillary: 131 mg/dL — ABNORMAL HIGH (ref 70–99)
Glucose-Capillary: 119 mg/dL — ABNORMAL HIGH (ref 70–99)
Glucose-Capillary: 132 mg/dL — ABNORMAL HIGH (ref 70–99)

## 2020-06-23 LAB — PROCALCITONIN: Procalcitonin: 0.86 ng/mL

## 2020-06-23 LAB — CBC
HCT: 33.5 % — ABNORMAL LOW (ref 36.0–46.0)
Hemoglobin: 11.1 g/dL — ABNORMAL LOW (ref 12.0–15.0)
MCH: 30.1 pg (ref 26.0–34.0)
MCHC: 33.1 g/dL (ref 30.0–36.0)
MCV: 90.8 fL (ref 80.0–100.0)
Platelets: 166 10*3/uL (ref 150–400)
RBC: 3.69 MIL/uL — ABNORMAL LOW (ref 3.87–5.11)
RDW: 12.7 % (ref 11.5–15.5)
WBC: 8 10*3/uL (ref 4.0–10.5)
nRBC: 0 % (ref 0.0–0.2)

## 2020-06-23 LAB — TRIGLYCERIDES: Triglycerides: 294 mg/dL — ABNORMAL HIGH (ref <150)

## 2020-06-23 LAB — MAGNESIUM: Magnesium: 2.1 mg/dL (ref 1.7–2.4)

## 2020-06-23 LAB — PHOSPHORUS: Phosphorus: 3.9 mg/dL (ref 2.5–4.6)

## 2020-06-23 MED ORDER — METOLAZONE 5 MG PO TABS
5.0000 mg | ORAL_TABLET | Freq: Once | ORAL | Status: AC
  Administered 2020-06-23: 5 mg via ORAL
  Filled 2020-06-23: qty 1

## 2020-06-23 MED ORDER — FUROSEMIDE 10 MG/ML IJ SOLN
40.0000 mg | Freq: Once | INTRAMUSCULAR | Status: AC
  Administered 2020-06-23: 40 mg via INTRAVENOUS
  Filled 2020-06-23: qty 4

## 2020-06-23 MED ORDER — PANTOPRAZOLE SODIUM 40 MG PO TBEC
40.0000 mg | DELAYED_RELEASE_TABLET | Freq: Every day | ORAL | Status: DC
  Administered 2020-06-23 – 2020-06-26 (×4): 40 mg via ORAL
  Filled 2020-06-23 (×4): qty 1

## 2020-06-23 MED ORDER — SODIUM CHLORIDE 0.9 % IV SOLN
3.0000 g | Freq: Four times a day (QID) | INTRAVENOUS | Status: DC
  Filled 2020-06-23 (×3): qty 8

## 2020-06-23 MED ORDER — LAMOTRIGINE 25 MG PO TABS
50.0000 mg | ORAL_TABLET | Freq: Every day | ORAL | Status: DC
Start: 2020-06-23 — End: 2020-06-26
  Administered 2020-06-23 – 2020-06-25 (×3): 50 mg via ORAL
  Filled 2020-06-23 (×4): qty 2

## 2020-06-23 MED ORDER — LAMOTRIGINE 100 MG PO TABS
200.0000 mg | ORAL_TABLET | Freq: Two times a day (BID) | ORAL | Status: DC
  Administered 2020-06-23 – 2020-06-26 (×6): 200 mg via ORAL
  Filled 2020-06-23 (×7): qty 2

## 2020-06-23 MED ORDER — DOCUSATE SODIUM 50 MG/5ML PO LIQD
100.0000 mg | Freq: Two times a day (BID) | ORAL | Status: DC
  Filled 2020-06-23: qty 10

## 2020-06-23 MED ORDER — HYDROXYZINE HCL 10 MG PO TABS
10.0000 mg | ORAL_TABLET | Freq: Three times a day (TID) | ORAL | Status: DC
  Administered 2020-06-23 – 2020-06-26 (×8): 10 mg via ORAL
  Filled 2020-06-23 (×9): qty 1

## 2020-06-23 MED ORDER — MONTELUKAST SODIUM 10 MG PO TABS
10.0000 mg | ORAL_TABLET | Freq: Every day | ORAL | Status: DC
Start: 2020-06-23 — End: 2020-06-26
  Administered 2020-06-23 – 2020-06-25 (×3): 10 mg via ORAL
  Filled 2020-06-23 (×3): qty 1

## 2020-06-23 MED ORDER — DEXMEDETOMIDINE HCL IN NACL 400 MCG/100ML IV SOLN
0.4000 ug/kg/h | INTRAVENOUS | Status: DC
  Administered 2020-06-23: 0.6 ug/kg/h via INTRAVENOUS
  Administered 2020-06-23: 0.4 ug/kg/h via INTRAVENOUS
  Filled 2020-06-23 (×3): qty 100

## 2020-06-23 MED ORDER — SODIUM CHLORIDE 0.9 % IV SOLN
3.0000 g | Freq: Four times a day (QID) | INTRAVENOUS | Status: AC
  Administered 2020-06-23 (×2): 3 g via INTRAVENOUS
  Filled 2020-06-23 (×2): qty 8

## 2020-06-23 MED ORDER — POTASSIUM CHLORIDE 20 MEQ PO PACK
40.0000 meq | PACK | Freq: Once | ORAL | Status: AC
  Administered 2020-06-23: 40 meq
  Filled 2020-06-23: qty 2

## 2020-06-23 MED ORDER — ESCITALOPRAM OXALATE 20 MG PO TABS
20.0000 mg | ORAL_TABLET | Freq: Every day | ORAL | Status: DC
  Administered 2020-06-23 – 2020-06-25 (×3): 20 mg via ORAL
  Filled 2020-06-23: qty 2
  Filled 2020-06-23: qty 1
  Filled 2020-06-23: qty 2

## 2020-06-23 MED ORDER — CARVEDILOL 6.25 MG PO TABS
6.2500 mg | ORAL_TABLET | Freq: Two times a day (BID) | ORAL | Status: DC
Start: 2020-06-24 — End: 2020-06-26
  Administered 2020-06-24 – 2020-06-26 (×5): 6.25 mg via ORAL
  Filled 2020-06-23 (×5): qty 1

## 2020-06-23 MED ORDER — OXCARBAZEPINE 300 MG PO TABS
300.0000 mg | ORAL_TABLET | Freq: Two times a day (BID) | ORAL | Status: DC
  Administered 2020-06-23 – 2020-06-26 (×6): 300 mg via ORAL
  Filled 2020-06-23 (×7): qty 1

## 2020-06-23 NOTE — Evaluation (Signed)
Clinical/Bedside Swallow Evaluation Patient Details  Name: [name] [name] MRN: [id number] Date of Birth: April 25, 1961  Today's Date: 06/23/2020 Time: SLP Start Time (ACUTE ONLY): 7829 SLP Stop Time (ACUTE ONLY): 1429 SLP Time Calculation (min) (ACUTE ONLY): 17 min  Past Medical History: History reviewed. No pertinent past medical history. Past Surgical History: History reviewed. No pertinent surgical history. HPI:  Pt is a 60 year old female with unknown past medical history who was brought into the ED by EMS with shortness of breath, cough. and 1 episode of seizure. Pt was intubated in the ED on 1/12 and extubated 1/16 at 12:24. CXR 1/16: Continued left lower lobe consolidation which could be due to aspiration or pneumonia. Right lung base ventilation mildly improved, perhaps due to regressed pleural effusion. CT head 1/12 was negative.   Assessment / Plan / Recommendation Clinical Impression  Pt was seen for bedside swallow evaluation and she denied a history of dysphagia. Oral mechanism exam was [facility]. Dentition was reduced with only limited mandibular teeth. Pt reported that she wears a full maxillary plate and partial mandibular dentures, but that they are not at the hospital. Per the pt, she prefers soft solids when her dentures are not in place, but can consume some harder solids. Vocal intensity was reduced and she reported that her voice sounds "deep" compared to her baseline, but her voice was otherwise WNL. She exhibited coughing with 1/14 boluses of thin liquids via straw, but no other s/sx of aspiration were noted with solids or liquids. Mastication was prolonged with regular texture solids secondary to reduced dentition, but no significant oral residue was noted. A dysphagia 2 diet with thin liquids is recommended at this time. SLP will follow to assess diet tolerance and for potential diet advancement. SLP Visit Diagnosis: Dysphagia, unspecified (R13.10)    Aspiration Risk  Mild  aspiration risk    Diet Recommendation Dysphagia 2 (Fine chop);Thin liquid   Liquid Administration via: Cup;Straw Medication Administration: Whole meds with liquid Supervision: Staff to assist with self feeding Compensations: Slow rate    Other  Recommendations Oral Care Recommendations: Oral care BID   Follow up Recommendations None      Frequency and Duration min 2x/week  1 week       Prognosis Prognosis for Safe Diet Advancement: Good      Swallow Study   General Date of Onset: 06/23/20 HPI: Pt is a 60 year old female with unknown past medical history who was brought into the ED by EMS with shortness of breath, cough. and 1 episode of seizure. Pt was intubated in the ED on 1/12 and extubated 1/16 at 12:24. CXR 1/16: Continued left lower lobe consolidation which could be due to aspiration or pneumonia. Right lung base ventilation mildly improved, perhaps due to regressed pleural effusion. CT head 1/12 was negative. Type of Study: Bedside Swallow Evaluation Previous Swallow Assessment: none Diet Prior to this Study: NPO Temperature Spikes Noted: No Respiratory Status: Nasal cannula History of Recent Intubation: Yes Length of Intubations (days): 4 days Date extubated: 06/23/20 Behavior/Cognition: Alert;Pleasant mood;Cooperative Oral Cavity Assessment: Within Functional Limits Oral Care Completed by SLP: No Oral Cavity - Dentition: Missing dentition;Dentures, not available (Pt reported that her dentures have neem left at home.; absent maxillary teeth, reduced mandibular) Vision: Functional for self-feeding Self-Feeding Abilities: Able to feed self Patient Positioning: Upright in bed;Postural control adequate for testing Baseline Vocal Quality: Low vocal intensity Volitional Cough: Weak (Mildly weak) Volitional Swallow: Able to elicit    Oral/Motor/Sensory Function Overall Oral Motor/Sensory  Function: Within functional limits   Ice Chips Ice chips: Within functional  limits Presentation: Spoon   Thin Liquid Thin Liquid: Impaired Presentation: Straw;Cup Pharyngeal  Phase Impairments: Throat Clearing - Immediate Other Comments:  (with 1/4 boluses of thin liquids via straw)    Nectar Thick Nectar Thick Liquid: Not tested   Honey Thick Honey Thick Liquid: Not tested   Puree Puree: Within functional limits   Solid     Solid: Impaired Presentation: Self Fed Oral Phase Impairments: Impaired mastication Oral Phase Functional Implications: Impaired mastication     Anab Vivar I. Hardin Negus, [place], [place] Office number [phone] Pager [phone]  Horton Marshall 06/23/2020,2:34 PM

## 2020-06-23 NOTE — Procedures (Signed)
Extubation Procedure Note  Patient Details:   Name: [name] [name] DOB: 1960/07/17 MRN: [id number]   Airway Documentation:    Vent end date: 06/23/20 Vent end time: 1224   Evaluation  O2 sats: stable throughout Complications: No apparent complications Patient did tolerate procedure well. Bilateral Breath Sounds: Diminished (course)   Yes   Pt extubated to 4L [place] without any stridor or any complications. Vitals are stable at this time. RT will continue to monitor.   Tobi Bastos 06/23/2020, 12:25 PM

## 2020-06-23 NOTE — Progress Notes (Signed)
Pt. has a pacemaker and will have to be scanned on Monday, RN is aware.

## 2020-06-23 NOTE — Progress Notes (Signed)
NAME:  [name] [name], MRN:  [id number], DOB:  [birth year], LOS: 4 ADMISSION DATE:  06/19/2020, CONSULTATION DATE: 06/19/2020 REFERRING MD:  Orpah Greek, MD, CHIEF COMPLAINT: Shortness of breath and altered mental status  Brief History:  60 year old female with unknown past medical history who was brought into the emergency department by EMS with shortness of breath, cough.  Apparently had 1 episode of seizure, intubated on mechanical ventilation  History of Present Illness:  Patient is intubated and sedated so most of the history is taken from chart review and limited information from patient's sister  60 year old female with unknown past medical history who was brought into the emergency department with a complaint of shortness of breath, cough and hypoxemia.  As per patient's sister she has not been feeling well for the last few days because of cough and nasal congestion, she took cough syrup before going into bed, after few hours she was noted to be having difficulty breathing so EMS was summoned, upon EMS arrival patient was noted to be hypoxic into 60s, she was started on bag valve ventilation and was brought into the emergency department in the ED patient apparently had left-sided shaking and then became unresponsive.  She was intubated and was placed on mechanical ventilation.  Past Medical History:  Seizure disorder Possible diabetes type 2  Significant Hospital Events:    Consults:  PCCM  Procedures:  ETT 1/12 > anticipate 1/13  Significant Diagnostic Tests:  1/12 CT head: No acute intracranial abnormality. 1/12 chest x-ray: Dense right upper lobe infiltrate/opacity  Micro Data:  1/12 influenza/COVID PCR negative  Antimicrobials:  Unasyn 1/12->1/13  Interim History / Subjective:   No events, pretty sleepy on vent, diuresing well.  Objective   Blood pressure 134/73, pulse (!) 59, temperature 97.7 F (36.5 C), temperature source Oral, resp. rate 18,  height 5\' 3"  (1.6 m), weight 73.9 kg, SpO2 96 %.    Vent Mode: PRVC FiO2 (%):  [40 %-60 %] 40 % Set Rate:  [18 bmp] 18 bmp Vt Set:  [420 mL] 420 mL PEEP:  [8 cmH20-10 cmH20] 8 cmH20 Plateau Pressure:  [14 cmH20-26 cmH20] 23 cmH20   Intake/Output Summary (Last 24 hours) at 06/23/2020 0804 Last data filed at 06/23/2020 0600 Gross per 24 hour  Intake 1871.69 ml  Output 4575 ml  Net -2703.31 ml   Filed Weights   06/21/20 0600 06/22/20 0239 06/23/20 0200  Weight: 79 kg 81.3 kg 73.9 kg    Examination: Constitutional: no acute distress  Eyes: EOMI, pupils equal Ears, nose, mouth, and throat: ETT in place, small thick secretions, strong cough Cardiovascular: RRR, paced on monitor, ext warm Respiratory: diminished bases, US showing volume loss and atelectasis without much fluid Gastrointestinal: soft, +BS Skin: No rashes, normal turgor Neurologic: moves all 4 ext to command Psychiatric: RASS -1  Cr okay WBC improved Pct improved Plts stable CXR stable bibasilar aispace disease  Resolved Hospital Problem list     Assessment & Plan:  Acute hypoxemic and hypercapneic respiratory failure- aspiration vs. Panic attack + VCD + atelectasis vs. Volume overload - Continue PPI, singulair, claritin, flonase, atarax, lexapro - IV Steroids and triple neb therapy (ICS/LABA/LAMA) - Unasyn x 5 days - Lasix + metolazone today - Switch to precedex to facilitate SAT/SBT, apneic with current regimen; would like to do extubation trial today if she can wake up enough - Needs SLP eval  Questionable seizure history- I do not see any objective evidence of this in her past neurology  notes (she has two MRNs in our system) including a hospitalization to epilepsy unit in which they tried to provoke these seizures.  Nonetheless she remains on AEDs.  Question somatization. - Continue AEDs, OP neuro f/u, I do not see need for MRI at present  CHB s/p pacemaker, dilated cardiomyopathy question stress  related - GDMT per cardiology - Diureses as renal function tolerates  Best practice (evaluated daily)  Diet: TF Pain/Anxiety/Delirium protocol (if indicated): as above VAP protocol (if indicated): in place DVT prophylaxis: Subcu heparin GI prophylaxis: PPI Glucose control: Insulin sq Mobility: BR Disposition: ICU   Goals of Care:  Last date of multidisciplinary goals of care discussion: Sister at bedside 06/21/2020 Family and staff present: pharmacy, self, sister, RN Summary of discussion: full code Follow up goals of care discussion due: 1/19 Code Status: Full code   Patient critically ill due to respiratory failure Interventions to address this today mechanical ventilation titration Risk of deterioration without these interventions is high  I personally spent 33 minutes providing critical care not including any separately billable procedures  Erskine Emery MD [facility] 06/23/2020 8:04 AM Personal pager: [phone] If unanswered, please page CCM On-call: [phone]

## 2020-06-24 DIAGNOSIS — I420 Dilated cardiomyopathy: Secondary | ICD-10-CM

## 2020-06-24 DIAGNOSIS — I442 Atrioventricular block, complete: Secondary | ICD-10-CM | POA: Diagnosis not present

## 2020-06-24 DIAGNOSIS — Z950 Presence of cardiac pacemaker: Secondary | ICD-10-CM

## 2020-06-24 DIAGNOSIS — I5020 Unspecified systolic (congestive) heart failure: Secondary | ICD-10-CM

## 2020-06-24 DIAGNOSIS — J9601 Acute respiratory failure with hypoxia: Secondary | ICD-10-CM | POA: Diagnosis not present

## 2020-06-24 LAB — GLUCOSE, CAPILLARY
Glucose-Capillary: 115 mg/dL — ABNORMAL HIGH (ref 70–99)
Glucose-Capillary: 122 mg/dL — ABNORMAL HIGH (ref 70–99)
Glucose-Capillary: 109 mg/dL — ABNORMAL HIGH (ref 70–99)

## 2020-06-24 LAB — CBC
HCT: 36.8 % (ref 36.0–46.0)
Hemoglobin: 12.5 g/dL (ref 12.0–15.0)
MCH: 30.9 pg (ref 26.0–34.0)
MCHC: 34 g/dL (ref 30.0–36.0)
MCV: 91.1 fL (ref 80.0–100.0)
Platelets: 188 10*3/uL (ref 150–400)
RBC: 4.04 MIL/uL (ref 3.87–5.11)
RDW: 12.7 % (ref 11.5–15.5)
WBC: 10.2 10*3/uL (ref 4.0–10.5)
nRBC: 0 % (ref 0.0–0.2)

## 2020-06-24 LAB — BASIC METABOLIC PANEL
Anion gap: 14 (ref 5–15)
BUN: 40 mg/dL — ABNORMAL HIGH (ref 6–20)
CO2: 26 mmol/L (ref 22–32)
Calcium: 9.7 mg/dL (ref 8.9–10.3)
Chloride: 98 mmol/L (ref 98–111)
Creatinine, Ser: 1.28 mg/dL — ABNORMAL HIGH (ref 0.44–1.00)
GFR, Estimated: 48 mL/min — ABNORMAL LOW (ref >60)
Glucose, Bld: 112 mg/dL — ABNORMAL HIGH (ref 70–99)
Potassium: 4 mmol/L (ref 3.5–5.1)
Sodium: 138 mmol/L (ref 135–145)

## 2020-06-24 LAB — MAGNESIUM: Magnesium: 2.3 mg/dL (ref 1.7–2.4)

## 2020-06-24 LAB — PHOSPHORUS: Phosphorus: 6.2 mg/dL — ABNORMAL HIGH (ref 2.5–4.6)

## 2020-06-24 LAB — TRIGLYCERIDES: Triglycerides: 261 mg/dL — ABNORMAL HIGH (ref <150)

## 2020-06-24 MED ORDER — ADULT MULTIVITAMIN W/MINERALS CH
1.0000 | ORAL_TABLET | Freq: Every day | ORAL | Status: DC
  Administered 2020-06-25 – 2020-06-26 (×2): 1 via ORAL
  Filled 2020-06-24 (×2): qty 1

## 2020-06-24 MED ORDER — FUROSEMIDE 40 MG PO TABS
40.0000 mg | ORAL_TABLET | Freq: Every day | ORAL | Status: DC
  Administered 2020-06-24: 40 mg via ORAL
  Filled 2020-06-24: qty 1

## 2020-06-24 MED ORDER — ENSURE ENLIVE PO LIQD
237.0000 mL | Freq: Two times a day (BID) | ORAL | Status: DC
  Administered 2020-06-25 – 2020-06-26 (×3): 237 mL via ORAL

## 2020-06-24 MED ORDER — LOPERAMIDE HCL 2 MG PO CAPS
2.0000 mg | ORAL_CAPSULE | ORAL | Status: DC | PRN
  Administered 2020-06-24: 2 mg via ORAL
  Filled 2020-06-24: qty 1

## 2020-06-24 NOTE — Progress Notes (Signed)
Progress Note  Patient Name: [name] [name] Date of Encounter: 06/24/2020  Primary Cardiologist: No primary care provider on file.   Subjective   60 yo F with hx of CHB ss/p MDT, DM with HTN, HFmrEF EF 40-45%, seizure disorder seen for acute hypoxic hypercarbic respiratory failure in the setting of aspiration PNA.    Since day prior patient has been extubate.  No chest pain or pressure .  No SOB/DOE and no PND/Orthopnea.  No weight gain or leg swelling.  No palpitations or syncope.   Inpatient Medications    Scheduled Meds:  arformoterol  15 mcg Nebulization BID   budesonide (PULMICORT) nebulizer solution  0.25 mg Nebulization BID   carvedilol  6.25 mg Oral BID WC   Chlorhexidine Gluconate Cloth  6 each Topical Daily   docusate  100 mg Oral BID   escitalopram  20 mg Oral QHS   fluticasone  2 spray Each Nare Daily   furosemide  40 mg Oral Daily   heparin  5,000 Units Subcutaneous Q8H   hydrOXYzine  10 mg Oral TID   lamoTRIgine  200 mg Oral BID   And   lamoTRIgine  50 mg Oral QHS   loratadine  10 mg Oral Daily   losartan  12.5 mg Oral Daily   methylPREDNISolone (SOLU-MEDROL) injection  40 mg Intravenous Q12H   montelukast  10 mg Oral QHS   OXcarbazepine  300 mg Oral BID   pantoprazole  40 mg Oral Daily   polyethylene glycol  17 g Per Tube Daily   revefenacin  175 mcg Nebulization Daily   sodium chloride flush  10-40 mL Intracatheter Q12H   Continuous Infusions:  sodium chloride Stopped (06/19/20 2317)   dexmedetomidine (PRECEDEX) IV infusion Stopped (06/24/20 1028)   PRN Meds: fentaNYL, levalbuterol, sodium chloride flush   Vital Signs    Vitals:   06/24/20 0825 06/24/20 0827 06/24/20 0900 06/24/20 1000  BP:   112/60 (!) 117/56  Pulse:   (!) 59 (!) 59  Resp:   15 15  Temp:      TempSrc:      SpO2: 93% 93% 92% 93%  Weight:      Height:        Intake/Output Summary (Last 24 hours) at 06/24/2020 1028 Last data filed at 06/24/2020 0900 Gross per 24 hour   Intake 920.07 ml  Output 2700 ml  Net -1779.93 ml   Filed Weights   06/22/20 0239 06/23/20 0200 06/24/20 0433  Weight: 81.3 kg 73.9 kg 73.9 kg    Telemetry    APVP rate 60 - Personally Reviewed  ECG    06/20/20:  SR 94 LBBB - Personally Reviewed  Physical Exam   GEN: No acute distress.   Neck: No JVD Cardiac: RRR, no murmurs, rubs, or gallops.  Respiratory: Clear to auscultation bilaterally. GI: Soft, nontender, non-distended  MS: No edema; No deformity. Neuro:  Nonfocal  Psych: Normal affect   Labs    Chemistry Recent Labs  Lab 06/19/20 0110 06/19/20 0240 06/22/20 0105 06/23/20 0115 06/24/20 0112  NA 140   < > 139 141 138  K 3.7   < > 3.8 3.7 4.0  CL 105   < > 103 102 98  CO2 15*   < > 23 25 26   GLUCOSE 350*   < > 113* 145* 112*  BUN 8   < > 13 28* 40*  CREATININE 1.35*   < > 1.17* 1.13* 1.28*  CALCIUM 8.7*   < >  8.6* 9.0 9.7  PROT 6.7  --   --   --   --   ALBUMIN 3.5  --   --   --   --   AST 28  --   --   --   --   ALT 18  --   --   --   --   ALKPHOS 153*  --   --   --   --   BILITOT 0.1*  --   --   --   --   GFRNONAA 45*   < > 53* 56* 48*  ANIONGAP 20*   < > 13 14 14    < > = values in this interval not displayed.     Hematology Recent Labs  Lab 06/22/20 0105 06/23/20 0115 06/24/20 0112  WBC 11.9* 8.0 10.2  RBC 3.67* 3.69* 4.04  HGB 11.1* 11.1* 12.5  HCT 33.7* 33.5* 36.8  MCV 91.8 90.8 91.1  MCH 30.2 30.1 30.9  MCHC 32.9 33.1 34.0  RDW 13.2 12.7 12.7  PLT 142* 166 188    Cardiac EnzymesNo results for input(s): TROPONINI in the last 168 hours. No results for input(s): TROPIPOC in the last 168 hours.   BNP Recent Labs  Lab 06/19/20 0110 06/22/20 0105  BNP 287.0* 477.8*     DDimer No results for input(s): DDIMER in the last 168 hours.   Radiology    DG Chest Port 1 View  Result Date: 06/23/2020 CLINICAL DATA:  60 year old female with seizure. Altered mental status. "Aspiration". EXAM: PORTABLE CHEST 1 VIEW COMPARISON:  Portable  chest 06/22/2020 and earlier. FINDINGS: Portable AP semi upright view at 0618 hours. Intubated. Endotracheal tube tip in good position between the clavicles and carina. Enteric tube loops in the stomach. Mediastinal contours remain normal. Stable left chest dual lead cardiac pacemaker. Continued dense retrocardiac opacity obscuring the left hemidiaphragm and with air bronchograms. Veiling opacity at the right lung base has regressed. But there remains streaky and indistinct right lower lung opacity. No pneumothorax. No definite edema. Paucity of bowel gas in the upper abdomen. IMPRESSION: 1.  Stable lines and tubes. 2. Continued left lower lobe consolidation which could be due to aspiration or pneumonia. Right lung base ventilation mildly improved, perhaps due to regressed pleural effusion. 3. No new cardiopulmonary abnormality. Electronically Signed   By: Genevie Ann M.D.   On: 06/23/2020 08:16    Cardiac Studies   Transthoracic Echocardiogram: Date: 06/24/20 Results:  1. Left ventricular ejection fraction, by estimation, is 40 to 45%. The  left ventricle has mildly decreased function. The left ventricle  demonstrates global hypokinesis. There is mild concentric left ventricular  hypertrophy. Left ventricular diastolic  parameters are consistent with Grade II diastolic dysfunction  (pseudonormalization).   2. Right ventricular systolic function is normal. The right ventricular  size is normal. There is moderately elevated pulmonary artery systolic  pressure. The estimated right ventricular systolic pressure is 86.7 mmHg.   3. A small pericardial effusion is present.   4. The mitral valve is normal in structure. Trivial mitral valve  regurgitation.   5. Tricuspid valve regurgitation is mild to moderate.   6. The aortic valve is tricuspid. Aortic valve regurgitation is not  visualized. No aortic stenosis is present.   7. The inferior vena cava is dilated in size with >50% respiratory  variability,  suggesting right atrial pressure of 8 mmHg.   Patient Profile     60 y.o. female new mrEF in the setting  of aspiration PNA and CHB  Assessment & Plan   Heart Failure mildly reduced Ejection Fraction  CHB - NYHA class I, Stage B, euvolemic, etiology from stress vs Chronic Pacing highest on DDX - Diuretic regimen: Lasix 40 mg PO Daily reasonable - Strict I/Os, daily weights, and fluid restriction of < 2 L  - Replace electrolytes PRN and keep K>4 and Mg>2. - Coreg 6.25 mg PO BID - Losartan 12.5 mg PO Daily; will look to uptitrate - Ischemic work up as outpatient - MRA/SGLT2i as oupatient - Device Indications: will need Outpatient EP follow up   Has 2nd MRN in our system:  Would keep 07/06/20 cardiology follow up appointment (Dr. Caryl Comes) currently scheduled Discussed with patient and nursing team  For questions or updates, please contact [facility] Please consult [email or web address] for contact info under Cardiology/STEMI.      Signed, Werner Lean, MD  06/24/2020, 10:28 AM

## 2020-06-24 NOTE — Progress Notes (Signed)
°  Speech Language Pathology Treatment: Dysphagia  Patient Details Name: [name] [name] MRN: [id number] DOB: 11-21-1960 Today's Date: 06/24/2020 Time: 1660-6301 SLP Time Calculation (min) (ACUTE ONLY): 10 min  Assessment / Plan / Recommendation Clinical Impression  Pt was observed consuming breakfast of ground sausage and thin liquids. Pt demonstrated an immediate cough following initial intake of thin liquids. Coughing was not observed following more consecutive consumption of thin liquids or during any trials when consuming sausage. Pt continues to demonstrate prolonged mastication without her dentures and benefited from smaller bites. Recommend continuing dysphagia 2 diet with thin liquids per pt report that this diet is easier without her dentures. Pt states that family member could bring her dentures but that she may be discharged within a day or two. Discussed recommendations and strategies for resuming her diet at home (e.g. slowed rate of eating) if she is discharged before f/u.  HPI HPI: Pt is a 60 year old female with unknown past medical history who was brought into the ED by EMS with shortness of breath, cough. and 1 episode of seizure. Pt was intubated in the ED on 1/12 and extubated 1/16 at 12:24. CXR 1/16: Continued left lower lobe consolidation which could be due to aspiration or pneumonia. Right lung base ventilation mildly improved, perhaps due to regressed pleural effusion. CT head 1/12 was negative.      SLP Plan  Continue with current plan of care       Recommendations  Diet recommendations: Dysphagia 2 (fine chop);Thin liquid Liquids provided via: Straw Medication Administration: Whole meds with liquid Supervision: Patient able to self feed Compensations: Slow rate;Small sips/bites Postural Changes and/or Swallow Maneuvers: Seated upright 90 degrees                Oral Care Recommendations: Oral care BID Follow up Recommendations: None SLP Visit Diagnosis:  Dysphagia, unspecified (R13.10) Plan: Continue with current plan of care       GO                Lebron Conners 06/24/2020, 11:37 AM

## 2020-06-24 NOTE — Plan of Care (Signed)
  Problem: Education: Goal: Knowledge of General Education information will improve Description: Including pain rating scale, medication(s)/side effects and non-pharmacologic comfort measures Outcome: Progressing   Problem: Health Behavior/Discharge Planning: Goal: Ability to manage health-related needs will improve Outcome: Progressing   Problem: Clinical Measurements: Goal: Ability to maintain clinical measurements within normal limits will improve Outcome: Progressing Goal: Will remain free from infection Outcome: Progressing Goal: Diagnostic test results will improve Outcome: Progressing Goal: Respiratory complications will improve Outcome: Progressing Goal: Cardiovascular complication will be avoided Outcome: Progressing   Problem: Activity: Goal: Risk for activity intolerance will decrease Outcome: Progressing   Problem: Nutrition: Goal: Adequate nutrition will be maintained Outcome: Progressing   Problem: Coping: Goal: Level of anxiety will decrease Outcome: Progressing   Problem: Elimination: Goal: Will not experience complications related to bowel motility Outcome: Progressing Goal: Will not experience complications related to urinary retention Outcome: Progressing   Problem: Pain Managment: Goal: General experience of comfort will improve Outcome: Progressing   Problem: Safety: Goal: Ability to remain free from injury will improve Outcome: Progressing   Problem: Skin Integrity: Goal: Risk for impaired skin integrity will decrease Outcome: Progressing   Problem: Activity: Goal: Ability to tolerate increased activity will improve Outcome: Progressing   Problem: Respiratory: Goal: Ability to maintain a clear airway and adequate ventilation will improve Outcome: Progressing   

## 2020-06-24 NOTE — Evaluation (Signed)
Physical Therapy Evaluation Patient Details Name: [name] [name] MRN: [id number] DOB: 1961-04-20 Today's Date: 06/24/2020   History of Present Illness  60 yo F with hx of CHB ss/p MDT, DM with HTN, HFmrEF EF 40-45%, seizure disorder seen for acute hypoxic hypercarbic respiratory failure in the setting of aspiration PNA. Pt intubated 1/12-1/13 and then 1/14 -1/16.  Clinical Impression  Pt admitted with above diagnosis. Pt was able to ambulate in room and walked to bathroom. Needed assist to clean herself. She states her sister did nothave to help her PTA but she will be there when pt goes home and she can assist pt. Pt has equipment. Will follow acutely.  Pt currently with functional limitations due to the deficits listed below (see PT Problem List). Pt will benefit from skilled PT to increase their independence and safety with mobility to allow discharge to the venue listed below.      Follow Up Recommendations Home health PT;Supervision/Assistance - 24 hour    Equipment Recommendations  None recommended by PT    Recommendations for Other Services       Precautions / Restrictions Precautions Precautions: Fall Restrictions Weight Bearing Restrictions: No      Mobility  Bed Mobility Overal bed mobility: Needs Assistance Bed Mobility: Supine to Sit     Supine to sit: Min assist     General bed mobility comments: A little assist to come to EOB.    Transfers Overall transfer level: Needs assistance Equipment used: Rolling walker (2 wheeled) Transfers: Sit to/from Stand Sit to Stand: Min guard         General transfer comment: guard assist to steady once up.  Ambulation/Gait Ambulation/Gait assistance: Min assist;Min guard Gait Distance (Feet): 40 Feet Assistive device: Rolling walker (2 wheeled) Gait Pattern/deviations: Step-through pattern;Decreased stride length;Drifts right/left;Wide base of support;Trunk flexed   Gait velocity interpretation: <1.31 ft/sec,  indicative of household ambulator General Gait Details: Pt flexes at trunk somewhat and needed cues to stay close to RW as well as assist to steer RW. Pt unsteady due to not staying close enough to the RW. Noted stool on the pad and walked pt to the bathroom to clean her of BM.  Pt needed assist to be cleaned.  Stairs            Wheelchair Mobility    Modified Rankin (Stroke Patients Only)       Balance Overall balance assessment: Needs assistance Sitting-balance support: No upper extremity supported;Feet supported Sitting balance-Leahy Scale: Good     Standing balance support: Bilateral upper extremity supported;During functional activity Standing balance-Leahy Scale: Poor Standing balance comment: relies on UE support for balance                             Pertinent Vitals/Pain Pain Assessment: No/denies pain    Home Living Family/patient expects to be discharged to:: Private residence Living Arrangements: Other relatives (sister) Available Help at Discharge: Family;Available 24 hours/day Type of Home: House Home Access: Stairs to enter   [employer] of Steps: 1 Home Layout: One level Home Equipment: Cane - single point;Walker - 4 wheels;Bedside commode;Shower seat      Prior Function Level of Independence: Independent               Hand Dominance   Dominant Hand: Left    Extremity/Trunk Assessment   Upper Extremity Assessment Upper Extremity Assessment: Defer to OT evaluation    Lower Extremity Assessment  Lower Extremity Assessment: Generalized weakness    Cervical / Trunk Assessment Cervical / Trunk Assessment: Normal  Communication   Communication: No difficulties  Cognition Arousal/Alertness: Awake/alert Behavior During Therapy: WFL for tasks assessed/performed Overall Cognitive Status: Within Functional Limits for tasks assessed                                        General Comments General  comments (skin integrity, edema, etc.): 60 bpm, 13    Exercises     Assessment/Plan    PT Assessment Patient needs continued PT services  PT Problem List Decreased mobility;Decreased balance;Decreased activity tolerance;Decreased knowledge of use of DME;Decreased safety awareness;Decreased knowledge of precautions;Obesity       PT Treatment Interventions DME instruction;Gait training;Functional mobility training;Therapeutic activities;Therapeutic exercise;Balance training;Patient/family education    PT Goals (Current goals can be found in the Care Plan section)  Acute Rehab PT Goals Patient Stated Goal: to go home PT Goal Formulation: With patient Time For Goal Achievement: 07/08/20 Potential to Achieve Goals: Good    Frequency Min 3X/week   Barriers to discharge        Co-evaluation               AM-PAC PT "6 Clicks" Mobility  Outcome Measure Help needed turning from your back to your side while in a flat bed without using bedrails?: None Help needed moving from lying on your back to sitting on the side of a flat bed without using bedrails?: A Little Help needed moving to and from a bed to a chair (including a wheelchair)?: A Little Help needed standing up from a chair using your arms (e.g., wheelchair or bedside chair)?: A Little Help needed to walk in hospital room?: A Little Help needed climbing 3-5 steps with a railing? : A Little 6 Click Score: 19    End of Session Equipment Utilized During Treatment: Gait belt Activity Tolerance: Patient limited by fatigue Patient left: in chair;with call bell/phone within reach;with chair alarm set Nurse Communication: Mobility status PT Visit Diagnosis: Muscle weakness (generalized) (M62.81)    Time: 2229-7989 PT Time Calculation (min) (ACUTE ONLY): 35 min   Charges:   PT Evaluation $PT Eval Moderate Complexity: 1 Mod PT Treatments $Gait Training: 8-22 mins        Ulyess Muto W,PT Acute Rehabilitation  Services Pager:  [phone]  Office:  [phone]    Denice Paradise 06/24/2020, 3:37 PM

## 2020-06-24 NOTE — Progress Notes (Signed)
Nutrition Follow Up  DOCUMENTATION CODES:   Not applicable  INTERVENTION:    Ensure Enlive po BID, each supplement provides 350 kcal and 20 grams of protein  MVI daily   NUTRITION DIAGNOSIS:   Increased nutrient needs related to acute illness as evidenced by estimated needs.  Ongoing  GOAL:   Patient will meet greater than or equal to 90% of their needs   Progressing   MONITOR:   PO intake,Supplement acceptance,Weight trends,Diet advancement,Labs,I & O's,Skin  REASON FOR ASSESSMENT:   Consult Assessment of nutrition requirement/status  ASSESSMENT:   Patient with PMH significant for CHB s/p MDT, DM, HTN, CHF, and seizure disorder. Presents this admission with acute hypoxic/ hypercapnic respiratory failure likely due to aspiration pneumonia.   1/12- intubated 1/13- extubated, emesis after drinking liquids, NPO, KUB without ileus 1/14- reintubated 1/16- extubated   Diet advanced to DYS 2 with thin liquids yesterday. Noted poor dentition with a history of wearing dentures. Plan to keep on this diet as its easier to manage. Last meal completion charted as 25%. Consumed sausage this am. RD to provide supplementation to maximize kcal and protein this admission.   Admission weight: 83.5 kg  Current weight: 73.9 kg   UOP: 3050 ml x 24 hrs   Medications: colace, 40 mg lasix daily, solumedrol, miralax Labs: Phosphorus 6.2 (H) CBG 109-132  Diet Order:  Dysphagia 2 with thin liquids   EDUCATION NEEDS:   Not appropriate for education at this time  Skin:  Skin Assessment: Reviewed RN Assessment  Last BM:  1/16  Height:   Ht Readings from Last 1 Encounters:  06/19/20 5\' 3"  (1.6 m)    Weight:   Wt Readings from Last 1 Encounters:  06/24/20 73.9 kg    Ideal Body Weight:  52.3 kg  BMI:  Body mass index is 28.86 kg/m.  Estimated Nutritional Needs:   Kcal:  1850-2050 kcal  Protein:  90-105 grams  Fluid:  >/= 1.8 L/day  Mariana Single RD, LDN Clinical  Nutrition Pager listed in [place]

## 2020-06-24 NOTE — Progress Notes (Signed)
NAME:  WILLIS KUIPERS, MRN:  [id number], DOB:  Dec 07, 1960, LOS: 5 ADMISSION DATE:  06/19/2020, CONSULTATION DATE: 06/19/2020 REFERRING MD:  Orpah Greek, MD, CHIEF COMPLAINT: Shortness of breath and altered mental status  Brief History:  60 year old female with unknown past medical history who was brought into the emergency department by EMS with shortness of breath, cough.  Apparently had 1 episode of seizure, intubated on mechanical ventilation  History of Present Illness:  Patient is intubated and sedated so most of the history is taken from chart review and limited information from patient's sister  60 year old female with unknown past medical history who was brought into the emergency department with a complaint of shortness of breath, cough and hypoxemia.  As per patient's sister she has not been feeling well for the last few days because of cough and nasal congestion, she took cough syrup before going into bed, after few hours she was noted to be having difficulty breathing so EMS was summoned, upon EMS arrival patient was noted to be hypoxic into 60s, she was started on bag valve ventilation and was brought into the emergency department in the ED patient apparently had left-sided shaking and then became unresponsive.  She was intubated and was placed on mechanical ventilation.  Past Medical History:  Seizure disorder Possible diabetes type 2  Significant Hospital Events:    Consults:  PCCM  Procedures:  ETT 1/12 > 1/13, 1/14 > 1/16  Significant Diagnostic Tests:  1/12 CT head: No acute intracranial abnormality. 1/12 chest x-ray: Dense right upper lobe infiltrate/opacity  Micro Data:  1/12 influenza/COVID PCR negative  Antimicrobials:  Unasyn 1/12->1/13  Interim History / Subjective:  Extubated, diuresing well, feels better.  Objective   Blood pressure 112/88, pulse 60, temperature 98.2 F (36.8 C), temperature source Oral, resp. rate 20, height 5\' 3"  (1.6  m), weight 73.9 kg, SpO2 93 %.    Vent Mode: PSV;CPAP FiO2 (%):  [40 %] 40 % Set Rate:  [18 bmp] 18 bmp Vt Set:  [420 mL] 420 mL PEEP:  [8 cmH20] 8 cmH20 Pressure Support:  [12 cmH20] 12 cmH20 Plateau Pressure:  [23 cmH20] 23 cmH20   Intake/Output Summary (Last 24 hours) at 06/24/2020 0841 Last data filed at 06/24/2020 0700 Gross per 24 hour  Intake 796.88 ml  Output 2950 ml  Net -2153.12 ml   Filed Weights   06/22/20 0239 06/23/20 0200 06/24/20 0433  Weight: 81.3 kg 73.9 kg 73.9 kg    Examination: Constitutional: no acute distress  Eyes: EOMI, pupils equal Ears, nose, mouth, and throat: MMM, trachea midline Cardiovascular: RRR, paced on monitor, ext warm Respiratory: diminished bases, normal RR Gastrointestinal: soft, +BS Skin: No rashes, normal turgor Neurologic: moves all 4 ext to command Psychiatric: RASS 0  Labs look okay Will stop CBGs  Resolved Hospital Problem list     Assessment & Plan:  Acute hypoxemic and hypercapneic respiratory failure- aspiration vs. Panic attack + VCD + atelectasis vs. Volume overload - Continue PPI, singulair, claritin, flonase, atarax, lexapro - IV Steroids and triple neb therapy (ICS/LABA/LAMA) - Unasyn x 5 days - Wean precedex - Dysphagia 2 diet - PT/OT, OOB to chair  Questionable seizure history- I do not see any objective evidence of this in her past neurology notes (she has two MRNs in our system) including a hospitalization to epilepsy unit in which they tried to provoke these seizures.  Nonetheless she remains on AEDs.  Question somatization. - Continue AEDs, OP neuro f/u, I do  not see need for MRI at present  CHB s/p pacemaker, dilated cardiomyopathy question stress related - GDMT per cardiology - Lasix to PO  Best practice (evaluated daily)  Diet: TF Pain/Anxiety/Delirium protocol (if indicated): as above VAP protocol (if indicated): in place DVT prophylaxis: Subcu heparin GI prophylaxis: PPI Glucose control:  DC Mobility: BR Disposition: Keep in ICU 1 more day given her history and to make sure stable off precedex, transfer to floor in AM  Goals of Care:  Last date of multidisciplinary goals of care discussion: Sister at bedside 06/21/2020 Family and staff present: pharmacy, self, sister, RN Summary of discussion: full code Follow up goals of care discussion due: 1/19 Code Status: Full code    Erskine Emery MD [facility] 06/24/2020 8:41 AM Personal pager: [phone] If unanswered, please page CCM On-call: [phone]

## 2020-06-25 DIAGNOSIS — I5020 Unspecified systolic (congestive) heart failure: Secondary | ICD-10-CM | POA: Diagnosis not present

## 2020-06-25 DIAGNOSIS — I442 Atrioventricular block, complete: Secondary | ICD-10-CM | POA: Diagnosis not present

## 2020-06-25 DIAGNOSIS — I420 Dilated cardiomyopathy: Secondary | ICD-10-CM | POA: Diagnosis not present

## 2020-06-25 DIAGNOSIS — J9601 Acute respiratory failure with hypoxia: Secondary | ICD-10-CM | POA: Diagnosis not present

## 2020-06-25 DIAGNOSIS — Z950 Presence of cardiac pacemaker: Secondary | ICD-10-CM | POA: Diagnosis not present

## 2020-06-25 LAB — BASIC METABOLIC PANEL
Anion gap: 14 (ref 5–15)
BUN: 39 mg/dL — ABNORMAL HIGH (ref 6–20)
CO2: 28 mmol/L (ref 22–32)
Calcium: 9.5 mg/dL (ref 8.9–10.3)
Chloride: 93 mmol/L — ABNORMAL LOW (ref 98–111)
Creatinine, Ser: 1.38 mg/dL — ABNORMAL HIGH (ref 0.44–1.00)
GFR, Estimated: 44 mL/min — ABNORMAL LOW (ref >60)
Glucose, Bld: 106 mg/dL — ABNORMAL HIGH (ref 70–99)
Potassium: 3.7 mmol/L (ref 3.5–5.1)
Sodium: 135 mmol/L (ref 135–145)

## 2020-06-25 LAB — CBC
HCT: 37.2 % (ref 36.0–46.0)
Hemoglobin: 12.9 g/dL (ref 12.0–15.0)
MCH: 30.9 pg (ref 26.0–34.0)
MCHC: 34.7 g/dL (ref 30.0–36.0)
MCV: 89 fL (ref 80.0–100.0)
Platelets: 229 10*3/uL (ref 150–400)
RBC: 4.18 MIL/uL (ref 3.87–5.11)
RDW: 12.9 % (ref 11.5–15.5)
WBC: 10.7 10*3/uL — ABNORMAL HIGH (ref 4.0–10.5)
nRBC: 0 % (ref 0.0–0.2)

## 2020-06-25 LAB — MAGNESIUM: Magnesium: 2.1 mg/dL (ref 1.7–2.4)

## 2020-06-25 LAB — PHOSPHORUS: Phosphorus: 4.9 mg/dL — ABNORMAL HIGH (ref 2.5–4.6)

## 2020-06-25 LAB — TRIGLYCERIDES: Triglycerides: 304 mg/dL — ABNORMAL HIGH (ref <150)

## 2020-06-25 MED ORDER — FUROSEMIDE 20 MG PO TABS
20.0000 mg | ORAL_TABLET | Freq: Every day | ORAL | Status: DC
  Administered 2020-06-26: 20 mg via ORAL
  Filled 2020-06-25: qty 1

## 2020-06-25 MED ORDER — DOCUSATE SODIUM 50 MG/5ML PO LIQD: 100.0000 mg | Freq: Two times a day (BID) | ORAL | Status: DC | PRN

## 2020-06-25 MED ORDER — COVID-19 MRNA VAC-TRIS(PFIZER) 30 MCG/0.3ML IM SUSP
0.3000 mL | Freq: Once | INTRAMUSCULAR | Status: AC
  Administered 2020-06-26: 0.3 mL via INTRAMUSCULAR
  Filled 2020-06-25 (×2): qty 0.3

## 2020-06-25 MED ORDER — POLYETHYLENE GLYCOL 3350 17 G PO PACK: 17.0000 g | PACK | Freq: Every day | ORAL | Status: DC | PRN

## 2020-06-25 MED ORDER — POLYETHYLENE GLYCOL 3350 17 G PO PACK: 17.0000 g | PACK | Freq: Every day | ORAL | Status: DC

## 2020-06-25 MED ORDER — RAMELTEON 8 MG PO TABS
8.0000 mg | ORAL_TABLET | Freq: Every day | ORAL | Status: DC
  Administered 2020-06-25: 8 mg via ORAL
  Filled 2020-06-25 (×2): qty 1

## 2020-06-25 MED ORDER — FLUTICASONE FUROATE-VILANTEROL 100-25 MCG/INH IN AEPB
1.0000 | INHALATION_SPRAY | Freq: Every day | RESPIRATORY_TRACT | Status: DC
  Administered 2020-06-25 – 2020-06-26 (×2): 1 via RESPIRATORY_TRACT
  Filled 2020-06-25: qty 28

## 2020-06-25 MED ORDER — POTASSIUM CHLORIDE CRYS ER 20 MEQ PO TBCR
40.0000 meq | EXTENDED_RELEASE_TABLET | Freq: Once | ORAL | Status: AC
  Administered 2020-06-25: 40 meq via ORAL
  Filled 2020-06-25: qty 2

## 2020-06-25 NOTE — Evaluation (Signed)
Occupational Therapy Evaluation Patient Details Name: [name] [name] MRN: [id number] DOB: [birth year] Today's Date: 06/25/2020    History of Present Illness 60 yo F with hx of CHB ss/p MDT, DM with HTN, HFmrEF EF 40-45%, seizure disorder seen for acute hypoxic hypercarbic respiratory failure in the setting of aspiration PNA. Pt intubated 1/12-1/13 and then 1/14 -1/16.   Clinical Impression   Pt PTA: Pt living with sister, but did not require assist from her. Pt was independent prior. Pt currently, standing for toilet hygiene and transferring on/off commode with no physical assist. Pt standing at sink for 5 mins for light ADL tasks. Pt reaching LB for ADL tasks with figure four technique. Pt education for energy conservation completed. Pt's HR 60 BPM, BP prior to  mobility 150/73, 94% O2 on RA with mobility; left on 1L >93% O2. Pt does not require continued OT skilled services. OT signing off.      Follow Up Recommendations  No OT follow up;Supervision - Intermittent    Equipment Recommendations  None recommended by OT    Recommendations for Other Services       Precautions / Restrictions Precautions Precautions: Fall Restrictions Weight Bearing Restrictions: No      Mobility Bed Mobility Overal bed mobility: Needs Assistance Bed Mobility: Supine to Sit     Supine to sit: Supervision     General bed mobility comments: no physical assist    Transfers Overall transfer level: Needs assistance Equipment used: Rolling walker (2 wheeled) Transfers: Sit to/from Stand Sit to Stand: Supervision         General transfer comment: no LOB    Balance Overall balance assessment: Needs assistance Sitting-balance support: No upper extremity supported;Feet supported Sitting balance-Leahy Scale: Good     Standing balance support: No upper extremity supported;During functional activity Standing balance-Leahy Scale: Fair Standing balance comment: Pt performing toilet hygiene  with no physical assist;  and BUEs without support of RW.                           ADL either performed or assessed with clinical judgement   ADL Overall ADL's : At baseline                                       General ADL Comments: Pt performing ADL at EOB with no physical assist and figure 4 technique. Pt standing for toilet hygiene and transferring on/off commode with no physical assist. Pt standing at sink for 5 mins for light ADL tasks.     Vision Baseline Vision/History: Wears glasses;No visual deficits Wears Glasses: Reading only Patient Visual Report: No change from baseline Vision Assessment?: No apparent visual deficits     Perception     Praxis      Pertinent Vitals/Pain Pain Assessment: No/denies pain     Hand Dominance Left   Extremity/Trunk Assessment Upper Extremity Assessment Upper Extremity Assessment: Defer to OT evaluation   Lower Extremity Assessment Lower Extremity Assessment: Generalized weakness   Cervical / Trunk Assessment Cervical / Trunk Assessment: Normal   Communication Communication Communication: No difficulties   Cognition Arousal/Alertness: Awake/alert Behavior During Therapy: WFL for tasks assessed/performed Overall Cognitive Status: Within Functional Limits for tasks assessed  General Comments  HR 60 BPM, BP prior to  mobility 150/73, 94% O2 on RA with mobility; left on 1L >93% O2    Exercises     Shoulder Instructions      Home Living Family/patient expects to be discharged to:: Private residence Living Arrangements: Other relatives Available Help at Discharge: Family;Available 24 hours/day Type of Home: House Home Access: Stairs to enter [employer] of Steps: 1   Home Layout: One level     Bathroom Shower/Tub: Teacher, early years/pre: Standard     Home Equipment: Cane - single point;Walker - 4 wheels;Bedside  commode;Shower seat          Prior Functioning/Environment Level of Independence: Independent                 OT Problem List:        OT Treatment/Interventions:      OT Goals(Current goals can be found in the care plan section) Acute Rehab OT Goals Patient Stated Goal: to go home OT Goal Formulation: All assessment and education complete, DC therapy  OT Frequency:     Barriers to D/C:            Co-evaluation              AM-PAC OT "6 Clicks" Daily Activity     Outcome Measure Help from another person eating meals?: None Help from another person taking care of personal grooming?: None Help from another person toileting, which includes using toliet, bedpan, or urinal?: None Help from another person bathing (including washing, rinsing, drying)?: A Little Help from another person to put on and taking off regular upper body clothing?: None Help from another person to put on and taking off regular lower body clothing?: None 6 Click Score: 23   End of Session Equipment Utilized During Treatment: Rolling walker;Oxygen Nurse Communication: Mobility status  Activity Tolerance: Patient tolerated treatment well Patient left: in chair;with call bell/phone within reach  OT Visit Diagnosis: Unsteadiness on feet (R26.81)                Time: 6283-6629 OT Time Calculation (min): 32 min Charges:  OT General Charges $OT Visit: 1 Visit OT Evaluation $OT Eval Moderate Complexity: 1 Mod OT Treatments $Self Care/Home Management : 8-22 mins  Jefferey Pica, OTR/L Acute Rehabilitation Services Pager: [phone] Office: [phone]   Norwood Quezada C 06/25/2020, 1:25 PM

## 2020-06-25 NOTE — Progress Notes (Signed)
NAME:  [name] [name], MRN:  [id number], DOB:  1960-12-15, LOS: 6 ADMISSION DATE:  06/19/2020, CONSULTATION DATE: 06/19/2020 REFERRING MD:  Orpah Greek, MD, CHIEF COMPLAINT: Shortness of breath and altered mental status  Brief History:  60 year old female with unknown past medical history who was brought into the emergency department by EMS with shortness of breath, cough.  Apparently had 1 episode of seizure, intubated on mechanical ventilation  History of Present Illness:  Patient is intubated and sedated so most of the history is taken from chart review and limited information from patient's sister  60 year old female with unknown past medical history who was brought into the emergency department with a complaint of shortness of breath, cough and hypoxemia.  As per patient's sister she has not been feeling well for the last few days because of cough and nasal congestion, she took cough syrup before going into bed, after few hours she was noted to be having difficulty breathing so EMS was summoned, upon EMS arrival patient was noted to be hypoxic into 60s, she was started on bag valve ventilation and was brought into the emergency department in the ED patient apparently had left-sided shaking and then became unresponsive.  She was intubated and was placed on mechanical ventilation.  Past Medical History:  Seizure disorder Possible diabetes type 2  Significant Hospital Events:    Consults:  PCCM  Procedures:  ETT 1/12 > 1/13, 1/14 > 1/16  Significant Diagnostic Tests:  1/12 CT head: No acute intracranial abnormality. 1/12 chest x-ray: Dense right upper lobe infiltrate/opacity  Micro Data:  1/12 influenza/COVID PCR negative  Antimicrobials:  Unasyn 1/12->1/13  Interim History / Subjective:  No events. Down to 2L Did not sleep well. Not eating well with new dysphagia diet, hoping this can be liberated.  Objective   Blood pressure (!) 119/59, pulse 60,  temperature 98.2 F (36.8 C), temperature source Oral, resp. rate 17, height 5\' 3"  (1.6 m), weight 72.8 kg, SpO2 95 %.        Intake/Output Summary (Last 24 hours) at 06/25/2020 0949 Last data filed at 06/25/2020 0500 Gross per 24 hour  Intake 405.24 ml  Output 2070 ml  Net -1664.76 ml   Filed Weights   06/23/20 0200 06/24/20 0433 06/25/20 0500  Weight: 73.9 kg 73.9 kg 72.8 kg    Examination: Constitutional: no acute distress   Eyes: EOMI Ears, nose, mouth, and throat: MMM, trachea midline Cardiovascular: RRR, ext warm Respiratory: clear, no wheezing or accessory muscle use Gastrointestinal: Soft, +BS Skin: No rashes, normal turgor Neurologic: moves all 4 ext Psychiatric: AOx3  Labs look okay   Resolved Hospital Problem list     Assessment & Plan:  Acute hypoxemic and hypercapneic respiratory failure- aspiration vs. Panic attack + VCD + atelectasis vs. Volume overload; response to diuretics argues mostly for volume overload.  Weight down 20lbs this admission. - Continue PPI, singulair, claritin, flonase, atarax, lexapro - Stop steroids - s/p unasyn - Liberate diet, stop if any s/s of aspiration - PT/OT, OOB to chair - Hold lasix today - Start lasix 20mg  PO tomorrow  Questionable seizure history- I do not see any objective evidence of this in her past neurology notes (she has two MRNs in our system) including a hospitalization to epilepsy unit in which they tried to provoke these seizures.  Nonetheless she remains on AEDs.  Question somatization. - Continue AEDs, OP neuro f/u, I do not see need for MRI  CHB s/p pacemaker, dilated cardiomyopathy question  stress related vs. pacemaker - GDMT per cardiology - Needs OP cards f/u and consider repeat echo in 2-3 mo  Best practice (evaluated daily)  Diet: TF Pain/Anxiety/Delirium protocol (if indicated): as above VAP protocol (if indicated): in place DVT prophylaxis: Subcu heparin GI prophylaxis: PPI Glucose control:  DC Mobility: BR Disposition: okay for floor, appreciate TRH help  Goals of Care:  Last date of multidisciplinary goals of care discussion: Sister at bedside 06/21/2020 Family and staff present: pharmacy, self, sister, RN Summary of discussion: full code Follow up goals of care discussion due: 1/19 Code Status: Full code    Erskine Emery MD [facility] 06/25/2020 9:49 AM Personal pager: [phone] If unanswered, please page CCM On-call: [phone]

## 2020-06-25 NOTE — Progress Notes (Addendum)
Progress Note  Patient Name: [name] [name] Date of Encounter: 06/25/2020  Primary Cardiologist: No primary care provider on file.   Subjective   60 yo F with hx of CHB ss/p MDT, DM with HTN, HFmrEF EF 40-45%, seizure disorder seen for acute hypoxic hypercarbic respiratory failure in the setting of aspiration PNA.  Extubation 06/23/20  Patient notes that she is doing well- has diverticulitis and can't eat the food she is being given.    No chest pain or pressure .  No SOB/DOE and no PND/Orthopnea.  No weight gain or leg swelling.  No palpitations or syncope .   Inpatient Medications    Scheduled Meds:  arformoterol  15 mcg Nebulization BID   budesonide (PULMICORT) nebulizer solution  0.25 mg Nebulization BID   carvedilol  6.25 mg Oral BID WC   Chlorhexidine Gluconate Cloth  6 each Topical Daily   docusate  100 mg Oral BID   escitalopram  20 mg Oral QHS   feeding supplement  237 mL Oral BID BM   fluticasone  2 spray Each Nare Daily   furosemide  40 mg Oral Daily   heparin  5,000 Units Subcutaneous Q8H   hydrOXYzine  10 mg Oral TID   lamoTRIgine  200 mg Oral BID   And   lamoTRIgine  50 mg Oral QHS   loratadine  10 mg Oral Daily   losartan  12.5 mg Oral Daily   methylPREDNISolone (SOLU-MEDROL) injection  40 mg Intravenous Q12H   montelukast  10 mg Oral QHS   multivitamin with minerals  1 tablet Oral Daily   OXcarbazepine  300 mg Oral BID   pantoprazole  40 mg Oral Daily   polyethylene glycol  17 g Oral Daily   revefenacin  175 mcg Nebulization Daily   sodium chloride flush  10-40 mL Intracatheter Q12H   Continuous Infusions:  sodium chloride Stopped (06/19/20 2317)   dexmedetomidine (PRECEDEX) IV infusion Stopped (06/24/20 1025)   PRN Meds: fentaNYL, levalbuterol, loperamide, sodium chloride flush   Vital Signs    Vitals:   06/25/20 0500 06/25/20 0600 06/25/20 0700 06/25/20 0746  BP: (!) 149/72 (!) 152/61 (!) 161/98 (!) 156/61  Pulse:  60 60 (!) 59 60  Resp: 16 15 16    Temp:      TempSrc:      SpO2: 95% 94% 97%   Weight: 72.8 kg     Height:        Intake/Output Summary (Last 24 hours) at 06/25/2020 0827 Last data filed at 06/25/2020 0500 Gross per 24 hour  Intake 612.63 ml  Output 2220 ml  Net -1607.37 ml   Filed Weights   06/23/20 0200 06/24/20 0433 06/25/20 0500  Weight: 73.9 kg 73.9 kg 72.8 kg    Telemetry    APVP 60 - Personally Reviewed  ECG    No new - Personally Reviewed  Physical Exam   GEN: No acute distress.   Neck: No JVD Cardiac: RRR, no murmurs, rubs, or gallops.  Respiratory: Clear to auscultation bilaterally. GI: Soft, nontender, non-distended  MS: No edema; No deformity. Neuro:  Nonfocal  Psych: Normal affect   Labs    Chemistry Recent Labs  Lab 06/19/20 0110 06/19/20 0240 06/23/20 0115 06/24/20 0112 06/25/20 0154  NA 140   < > 141 138 135  K 3.7   < > 3.7 4.0 3.7  CL 105   < > 102 98 93*  CO2 15*   < > 25 26 28  GLUCOSE 350*   < > 145* 112* 106*  BUN 8   < > 28* 40* 39*  CREATININE 1.35*   < > 1.13* 1.28* 1.38*  CALCIUM 8.7*   < > 9.0 9.7 9.5  PROT 6.7  --   --   --   --   ALBUMIN 3.5  --   --   --   --   AST 28  --   --   --   --   ALT 18  --   --   --   --   ALKPHOS 153*  --   --   --   --   BILITOT 0.1*  --   --   --   --   GFRNONAA 45*   < > 56* 48* 44*  ANIONGAP 20*   < > 14 14 14    < > = values in this interval not displayed.     Hematology Recent Labs  Lab 06/23/20 0115 06/24/20 0112 06/25/20 0154  WBC 8.0 10.2 10.7*  RBC 3.69* 4.04 4.18  HGB 11.1* 12.5 12.9  HCT 33.5* 36.8 37.2  MCV 90.8 91.1 89.0  MCH 30.1 30.9 30.9  MCHC 33.1 34.0 34.7  RDW 12.7 12.7 12.9  PLT 166 188 229    Cardiac EnzymesNo results for input(s): TROPONINI in the last 168 hours. No results for input(s): TROPIPOC in the last 168 hours.   BNP Recent Labs  Lab 06/19/20 0110 06/22/20 0105  BNP 287.0* 477.8*     DDimer No results for input(s): DDIMER in the last 168  hours.   Radiology    No results found.  Cardiac Studies   Transthoracic Echocardiogram: Date: 06/24/20 Results:  1. Left ventricular ejection fraction, by estimation, is 40 to 45%. The  left ventricle has mildly decreased function. The left ventricle  demonstrates global hypokinesis. There is mild concentric left ventricular  hypertrophy. Left ventricular diastolic  parameters are consistent with Grade II diastolic dysfunction  (pseudonormalization).   2. Right ventricular systolic function is normal. The right ventricular  size is normal. There is moderately elevated pulmonary artery systolic  pressure. The estimated right ventricular systolic pressure is 17.4 mmHg.   3. A small pericardial effusion is present.   4. The mitral valve is normal in structure. Trivial mitral valve  regurgitation.   5. Tricuspid valve regurgitation is mild to moderate.   6. The aortic valve is tricuspid. Aortic valve regurgitation is not  visualized. No aortic stenosis is present.   7. The inferior vena cava is dilated in size with >50% respiratory  variability, suggesting right atrial pressure of 8 mmHg.   Patient Profile     60 y.o. female new mrEF in the setting of aspiration PNA and CHB  Assessment & Plan   Heart Failure mildly reduced Ejection Fraction  CHB - NYHA class I, Stage B, euvolemic, etiology from stress vs Chronic Pacing highest on DDX - Diuretic regimen: Lasix 40 mg PO Daily reasonable - Strict I/Os, daily weights, and fluid restriction of < 2 L  - Replace electrolytes PRN and keep K>4 and Mg>2. - Coreg 6.25 mg PO BID - Losartan 12.5 mg PO Daily; will look to uptitrate - Ischemic work up as outpatient - MRA/SGLT2i as oupatient - Device Indications: has Outpatient EP follow up   Has 2nd MRN in our system:  Would keep 07/08/20 cardiology follow up appointment Chalmers Cater) currently scheduled in her actual MRN  [facility] will sign off.  Medication Recommendations:  Low  dose lasix reasonable to continue  Other recommendations (labs, testing, etc):  would need one week post DC BMP Follow up as an outpatient:  Patient to keep her 07/08/20 f/u APPT   For questions or updates, please contact [facility] Please consult [email or web address] for contact info under Cardiology/STEMI.      Signed, Werner Lean, MD  06/25/2020, 8:27 AM

## 2020-06-26 LAB — CBC
HCT: 38.4 % (ref 36.0–46.0)
Hemoglobin: 13.1 g/dL (ref 12.0–15.0)
MCH: 30.5 pg (ref 26.0–34.0)
MCHC: 34.1 g/dL (ref 30.0–36.0)
MCV: 89.3 fL (ref 80.0–100.0)
Platelets: 244 10*3/uL (ref 150–400)
RBC: 4.3 MIL/uL (ref 3.87–5.11)
RDW: 13 % (ref 11.5–15.5)
WBC: 10.6 10*3/uL — ABNORMAL HIGH (ref 4.0–10.5)
nRBC: 0 % (ref 0.0–0.2)

## 2020-06-26 LAB — BASIC METABOLIC PANEL
Anion gap: 11 (ref 5–15)
BUN: 32 mg/dL — ABNORMAL HIGH (ref 6–20)
CO2: 28 mmol/L (ref 22–32)
Calcium: 8.9 mg/dL (ref 8.9–10.3)
Chloride: 97 mmol/L — ABNORMAL LOW (ref 98–111)
Creatinine, Ser: 1.29 mg/dL — ABNORMAL HIGH (ref 0.44–1.00)
GFR, Estimated: 48 mL/min — ABNORMAL LOW (ref >60)
Glucose, Bld: 91 mg/dL (ref 70–99)
Potassium: 3.3 mmol/L — ABNORMAL LOW (ref 3.5–5.1)
Sodium: 136 mmol/L (ref 135–145)

## 2020-06-26 LAB — PHOSPHORUS: Phosphorus: 3.8 mg/dL (ref 2.5–4.6)

## 2020-06-26 LAB — MAGNESIUM: Magnesium: 2.2 mg/dL (ref 1.7–2.4)

## 2020-06-26 MED ORDER — LOSARTAN POTASSIUM 25 MG PO TABS: 12.5000 mg | ORAL_TABLET | Freq: Every day | ORAL | 2 refills | Status: DC

## 2020-06-26 MED ORDER — POTASSIUM CHLORIDE CRYS ER 20 MEQ PO TBCR
40.0000 meq | EXTENDED_RELEASE_TABLET | Freq: Every day | ORAL | Status: DC
  Administered 2020-06-26: 40 meq via ORAL
  Filled 2020-06-26: qty 2

## 2020-06-26 MED ORDER — ESCITALOPRAM OXALATE 20 MG PO TABS: 20.0000 mg | ORAL_TABLET | Freq: Every day | ORAL | 2 refills | Status: DC

## 2020-06-26 MED ORDER — ALBUTEROL SULFATE HFA 108 (90 BASE) MCG/ACT IN AERS: 2.0000 | INHALATION_SPRAY | Freq: Four times a day (QID) | RESPIRATORY_TRACT | 2 refills | Status: DC | PRN

## 2020-06-26 MED ORDER — FUROSEMIDE 20 MG PO TABS: 20.0000 mg | ORAL_TABLET | Freq: Every day | ORAL | 2 refills | Status: DC

## 2020-06-26 MED ORDER — PANTOPRAZOLE SODIUM 40 MG PO TBEC: 40.0000 mg | DELAYED_RELEASE_TABLET | Freq: Every day | ORAL | 0 refills | Status: DC

## 2020-06-26 MED ORDER — POTASSIUM CHLORIDE CRYS ER 20 MEQ PO TBCR: 20.0000 meq | EXTENDED_RELEASE_TABLET | Freq: Every day | ORAL | 0 refills | Status: DC

## 2020-06-26 MED ORDER — CARVEDILOL 6.25 MG PO TABS: 6.2500 mg | ORAL_TABLET | Freq: Two times a day (BID) | ORAL | 2 refills | Status: DC

## 2020-06-26 NOTE — Discharge Summary (Addendum)
Physician Discharge Summary  [name] [name] [id number] DOB: 1960/08/14 DOA: 06/19/2020  PCP: Aretta Nip, MD  Admit date: 06/19/2020 Discharge date: 06/26/2020  Admitted From: Home Disposition: Home  Recommendations for Outpatient Follow-up:  1. Follow up with PCP in 1-2 weeks 2. Please obtain BMP/CBC in one week 3. Keep up the schedule follow-up with cardiology.  Home Health: Offered.  Patient declined Equipment/Devices: None  Discharge Condition: Stable CODE STATUS: Full code Diet recommendation: Regular diet.  Discharge summary:  60 year old female with history of complete heart block status post pacemaker, hypertension, heart failure with mildly reduced ejection fraction 40-45% and seizure disorder who was admitted to the intensive care unit on 06/19/2020 with shortness of breath and cough, episode of seizure with EMS prompting intubation and mechanical ventilation.  As per report, she was complaining of shortness of breath and cough for some time, took some cough syrup before going to bed and after few hours she was noted to be struggling to breathe.  Sister called EMS.  EMS found her to be hypoxic with oxygen 60%, they started her on bag valve ventilation and brought into the emergency department where she was unresponsive and was immediately intubated.  Remained on mechanical ventilation 1/12-1/13.  Transfer to medical floor on 1/14. Severe shortness of breath and hypoxia on 1/14, back to ICU on BiPAP and treated for congestive heart failure with improvement. Influenza and COVID-19 negative. CT head normal. Chest x-ray with dense right upper lobe infiltrate or opacity. Treated as heart failure and aspiration pneumonia, stabilized and transferred to [facility] care on 1/19 morning.  1/19 morning, patient is walking in the hallway on room air.  Some fatigue but no shortness of breath.  Asymptomatic.  Eager to go home.  # 1. Acute hypoxemic and hypercapnic  respiratory failure: Secondary to aspiration pneumonia and volume overload.  More likely volume overload and aspiration pneumonia.  No evidence of infection. Treated as heart failure exacerbation with diuretics and 20 pound negative balance. Currently on room air.  Ambulated with no symptoms. As per cardiology recommendations, she is going home on carvedilol, Lasix 20 mg daily, losartan 12.5, potassium supplements. She has a normal functioning pacemaker, she has a scheduled follow-up at device clinic within 2 weeks. Has some debility but no deficits, home health PT offered but she declines. Her shortness of breath and wheezing was likely due to fluid overload, pulmonary recommended discharge with PPI, Singulair and will also prescribe albuterol as needed. Started on citalopram in ICU, will prescribe as there was evidence of panic attacks.  #2 .seizure disorder: Previous extensive investigations.  Currently on Lamictal and Trileptal.  Unlikely recurrent seizure.  #3 .acute metabolic toxic encephalopathy: Due to hypoxia.  Resolved and normalized.  #4 .dilated cardiomyopathy with mildly reduced ejection fraction: As above.  Currently euvolemic and going home on heart failure regimen.  Sepsis present on admission due to aspiration pneumonia , resolved.    Discharge Diagnoses:  Active Problems:   Acute respiratory failure (HCC)   Seizure (HCC)   CHB (complete heart block) (HCC)   HFrEF (heart failure with reduced ejection fraction) [facility])    Discharge Instructions  Discharge Instructions    Call MD for:  difficulty breathing, headache or visual disturbances   Complete by: As directed    Call MD for:  extreme fatigue   Complete by: As directed    Call MD for:  persistant dizziness or light-headedness   Complete by: As directed    Diet - low  sodium heart healthy   Complete by: As directed    Increase activity slowly   Complete by: As directed    No wound care   Complete by: As  directed      Allergies as of 06/26/2020      Reactions   Aspirin    UNK reaction   Ciprofloxacin    UNK reaction   Codeine Hives   Hydrocodone Hives   Oxycodone Hives   Penicillins Hives, Swelling   Pt tolerated ampicillin/sulbactam without issues 06/2020      Medication List    TAKE these medications   acetaminophen 500 MG tablet Commonly known as: TYLENOL Take 1,000 mg by mouth every 6 (six) hours as needed for mild pain.   albuterol 108 (90 Base) MCG/ACT inhaler Commonly known as: VENTOLIN HFA Inhale 2 puffs into the lungs every 6 (six) hours as needed for wheezing or shortness of breath.   atorvastatin 20 MG tablet Commonly known as: LIPITOR Take 20 mg by mouth daily.   carvedilol 6.25 MG tablet Commonly known as: COREG Take 1 tablet (6.25 mg total) by mouth 2 (two) times daily with a meal.   CORICIDIN HBP CONGESTION/COUGH PO Take 1 tablet by mouth every 4 (four) hours as needed (cold symptoms).   escitalopram 20 MG tablet Commonly known as: LEXAPRO Take 1 tablet (20 mg total) by mouth at bedtime.   furosemide 20 MG tablet Commonly known as: LASIX Take 1 tablet (20 mg total) by mouth daily. Start taking on: June 27, 2020   lamoTRIgine 25 MG tablet Commonly known as: LAMICTAL Take 50 mg by mouth at bedtime.   lamoTRIgine 200 MG tablet Commonly known as: LAMICTAL Take 200 mg by mouth 2 (two) times daily.   losartan 25 MG tablet Commonly known as: COZAAR Take 0.5 tablets (12.5 mg total) by mouth daily. Start taking on: June 27, 2020   Oxcarbazepine 300 MG tablet Commonly known as: TRILEPTAL Take 300 mg by mouth 2 (two) times daily.   pantoprazole 40 MG tablet Commonly known as: PROTONIX Take 1 tablet (40 mg total) by mouth daily. Start taking on: June 27, 2020   potassium chloride SA 20 MEQ tablet Commonly known as: KLOR-CON Take 1 tablet (20 mEq total) by mouth daily. Start taking on: June 27, 2020   Vitamin D 125 MCG (5000 UT)  Caps Take 5,000 Units by mouth daily.       Follow-up Information    Shirley Friar, PA-C Follow up on 07/08/2020.   Specialty: Physician Assistant Why: Please arrive 15 minutes early for you 10:30 am post-hospital cardiology follow-up.  Contact information: [address] [place] [address] [phone]              Allergies  Allergen Reactions  . Aspirin     UNK reaction  . Ciprofloxacin     UNK reaction  . Codeine Hives  . Hydrocodone Hives  . Oxycodone Hives  . Penicillins Hives and Swelling    Pt tolerated ampicillin/sulbactam without issues 06/2020    Consultations:  Cardiology  PCCM   Procedures/Studies: DG Abd 1 View  Result Date: 06/20/2020 CLINICAL DATA:  Vomiting EXAM: ABDOMEN - 1 VIEW COMPARISON:  None. FINDINGS: The bowel gas pattern is normal. No radio-opaque calculi or other significant radiographic abnormality are seen. IMPRESSION: No evidence of bowel obstruction. Electronically Signed   By: Dahlia Bailiff MD   On: 06/20/2020 17:38   CT HEAD WO CONTRAST  Result Date:  06/19/2020 CLINICAL DATA:  Altered mental status EXAM: CT HEAD WITHOUT CONTRAST TECHNIQUE: Contiguous axial images were obtained from the base of the skull through the vertex without intravenous contrast. COMPARISON:  10/22/2014 FINDINGS: Brain: No acute intracranial abnormality. Specifically, no hemorrhage, hydrocephalus, mass lesion, acute infarction, or significant intracranial injury. Vascular: No hyperdense vessel or unexpected calcification. Skull: No acute calvarial abnormality. Sinuses/Orbits: Visualized paranasal sinuses and mastoids clear. Orbital soft tissues unremarkable. Other: None IMPRESSION: No acute intracranial abnormality. Electronically Signed   By: Rolm Baptise M.D.   On: 06/19/2020 01:47   DG Chest Port 1 View  Result Date: 06/23/2020 CLINICAL DATA:  60 year old female with seizure. Altered mental status. "Aspiration". EXAM: PORTABLE CHEST 1  VIEW COMPARISON:  Portable chest 06/22/2020 and earlier. FINDINGS: Portable AP semi upright view at 0618 hours. Intubated. Endotracheal tube tip in good position between the clavicles and carina. Enteric tube loops in the stomach. Mediastinal contours remain normal. Stable left chest dual lead cardiac pacemaker. Continued dense retrocardiac opacity obscuring the left hemidiaphragm and with air bronchograms. Veiling opacity at the right lung base has regressed. But there remains streaky and indistinct right lower lung opacity. No pneumothorax. No definite edema. Paucity of bowel gas in the upper abdomen. IMPRESSION: 1.  Stable lines and tubes. 2. Continued left lower lobe consolidation which could be due to aspiration or pneumonia. Right lung base ventilation mildly improved, perhaps due to regressed pleural effusion. 3. No new cardiopulmonary abnormality. Electronically Signed   By: Genevie Ann M.D.   On: 06/23/2020 08:16   DG CHEST PORT 1 VIEW  Result Date: 06/22/2020 CLINICAL DATA:  Endotracheal tube placement EXAM: PORTABLE CHEST 1 VIEW COMPARISON:  June 21, 2020 FINDINGS: The endotracheal tube terminates above the carina by approximately 2.9 cm. There is a dual chamber left-sided pacemaker in place. The enteric tube extends below the left hemidiaphragm and terminates over the gastric body. There is a dense retrocardiac opacity which has worsened since the prior study. There are bibasilar airspace opacities, right worse than left which have worsened since the prior study. There is a growing right-sided pleural effusion. There is no pneumothorax. IMPRESSION: 1. Lines and tubes as above. 2. Worsening aeration bilaterally. Electronically Signed   By: Constance Holster M.D.   On: 06/22/2020 00:49   DG Chest Port 1 View  Result Date: 06/21/2020 CLINICAL DATA:  Hypoxia.  Recent congestion. EXAM: PORTABLE CHEST 1 VIEW COMPARISON:  06/20/2020 FINDINGS: Left chest wall pacer device is noted with leads in the right  atrial appendage and right ventricle. The heart size is normal. Small pleural effusions and pulmonary vascular congestion is again noted. Interval increased opacification of the right lower lobe. IMPRESSION: 1. Stable small bilateral pleural effusions and pulmonary vascular congestion. 2. Worsening aeration to the right lower lobe. Electronically Signed   By: Kerby Moors M.D.   On: 06/21/2020 12:15   DG CHEST PORT 1 VIEW  Result Date: 06/20/2020 CLINICAL DATA:  60 year old female with acute respiratory failure. EXAM: PORTABLE CHEST 1 VIEW COMPARISON:  Chest radiograph dated 06/19/2020. FINDINGS: There is borderline cardiomegaly with vascular congestion and mild edema. Pneumonia is not excluded. Clinical correlation is recommended. Trace pleural effusions may be present. No pneumothorax. Left pectoral pacemaker device. No acute osseous pathology. IMPRESSION: Borderline cardiomegaly with findings of CHF. Pneumonia is not excluded. Electronically Signed   By: Anner Crete M.D.   On: 06/20/2020 16:26   DG Chest Port 1 View  Result Date: 06/19/2020 CLINICAL DATA:  ET placement EXAM:  PORTABLE CHEST 1 VIEW COMPARISON:  Radiograph 12/14/2014 FINDINGS: Endotracheal tube tip low in the trachea, 2.8 cm from carina. Consider retraction 1-2 cm to the mid trachea. Transesophageal tube tip and side port distal to the GE junction, beyond the margins of imaging. Telemetry leads and external support devices overlie the chest. Left chest wall battery pack with pacer leads at the right atrium and cardiac apex. Dense airspace opacities seen focally in the right upper lobe. Additional hazy interstitial opacities with fissural and septal thickening and pulmonary vascular congestion are seen elsewhere. No pneumothorax or effusion. Cardiomediastinal contours are similar to priors accounting for differences in technique. The aorta is calcified. The remaining cardiomediastinal contours are unremarkable. No acute osseous or soft  tissue abnormality. Degenerative changes are present in the imaged spine and shoulders. IMPRESSION: 1. Endotracheal tube tip low in the trachea, 2.8 cm from carina. Consider retraction 1-2 cm to the mid trachea. 2. Transesophageal tube tip and side port distal to the GE junction, beyond the margins of imaging. 3. Dense opacity within the right upper lobe, could reflect infection or asymmetric pulmonary edema with more diffuse interstitial edematous changes elsewhere in the lungs. Electronically Signed   By: Lovena Le M.D.   On: 06/19/2020 01:20   EEG adult  Result Date: 06/19/2020 Lora Havens, MD     06/19/2020 11:34 AM Patient Name: [name] [name] MRN: [id number] Epilepsy Attending: Lora Havens Referring Physician/Provider: Rudene Re, PA Date: 06/19/2020 Duration: 25.53 mins Patient history: 60 year old female with history of epilepsy, noted to have an episode of seizure.  EEG to evaluate for seizures. Level of alertness: Awake, asleep AEDs during EEG study: None Technical aspects: This EEG study was done with scalp electrodes positioned according to the 10-20 International system of electrode placement. Electrical activity was acquired at a sampling rate of 500Hz  and reviewed with a high frequency filter of 70Hz  and a low frequency filter of 1Hz . EEG data were recorded continuously and digitally stored. Description: The posterior dominant rhythm consists of 9 Hz activity of moderate voltage (25-35 uV) seen predominantly in posterior head regions, symmetric and reactive to eye opening and eye closing. Sleep was characterized by vertex waves, sleep spindles (12 to 14 Hz), maximal frontocentral region.  Hyperventilation and photic stimulation were not performed.   IMPRESSION: This study is within normal limits. No seizures or epileptiform discharges were seen throughout the recording. Lora Havens   ECHOCARDIOGRAM COMPLETE  Result Date: 06/21/2020    ECHOCARDIOGRAM REPORT   Patient  Name:   [name] [name] Date of Exam: 06/21/2020 Medical Rec #:  [id number]         Height:       63.0 in Accession #:    [id number]        Weight:       174.2 lb Date of Birth:  1961/06/07         BSA:          1.823 m Patient Age:    63 years          BP:           146/77 mmHg Patient Gender: F                 HR:           90 bpm. Exam Location:  Inpatient Procedure: 2D Echo Indications:    congestive heart failure  History:        Patient has no prior history  of Echocardiogram examinations.                 Pacemaker, Signs/Symptoms:Shortness of Breath; Risk                 Factors:Diabetes.  Sonographer:    Johny Chess Referring Phys: [id number] [place] IMPRESSIONS  1. Left ventricular ejection fraction, by estimation, is 40 to 45%. The left ventricle has mildly decreased function. The left ventricle demonstrates global hypokinesis. There is mild concentric left ventricular hypertrophy. Left ventricular diastolic parameters are consistent with Grade II diastolic dysfunction (pseudonormalization).  2. Right ventricular systolic function is normal. The right ventricular size is normal. There is moderately elevated pulmonary artery systolic pressure. The estimated right ventricular systolic pressure is 0000000 mmHg.  3. A small pericardial effusion is present.  4. The mitral valve is normal in structure. Trivial mitral valve regurgitation.  5. Tricuspid valve regurgitation is mild to moderate.  6. The aortic valve is tricuspid. Aortic valve regurgitation is not visualized. No aortic stenosis is present.  7. The inferior vena cava is dilated in size with >50% respiratory variability, suggesting right atrial pressure of 8 mmHg. Comparison(s): No prior Echocardiogram. FINDINGS  Left Ventricle: Left ventricular ejection fraction, by estimation, is 40 to 45%. The left ventricle has mildly decreased function. The left ventricle demonstrates global hypokinesis. The left ventricular internal cavity size was normal in  size. There is  mild concentric left ventricular hypertrophy. Left ventricular diastolic parameters are consistent with Grade II diastolic dysfunction (pseudonormalization). Right Ventricle: The right ventricular size is normal. No increase in right ventricular wall thickness. Right ventricular systolic function is normal. There is moderately elevated pulmonary artery systolic pressure. The tricuspid regurgitant velocity is 3.39 m/s, and with an assumed right atrial pressure of 8 mmHg, the estimated right ventricular systolic pressure is 0000000 mmHg. Left Atrium: Left atrial size was normal in size. Right Atrium: Right atrial size was normal in size. Pericardium: A small pericardial effusion is present. Mitral Valve: The mitral valve is normal in structure. Trivial mitral valve regurgitation. Tricuspid Valve: The tricuspid valve is grossly normal. Tricuspid valve regurgitation is mild to moderate. Aortic Valve: The aortic valve is tricuspid. Aortic valve regurgitation is not visualized. No aortic stenosis is present. Pulmonic Valve: The pulmonic valve was not well visualized. Pulmonic valve regurgitation is not visualized. Aorta: The aortic root and ascending aorta are structurally normal, with no evidence of dilitation. Venous: The inferior vena cava is dilated in size with greater than 50% respiratory variability, suggesting right atrial pressure of 8 mmHg. IAS/Shunts: The atrial septum is grossly normal. Additional Comments: A pacer wire is visualized.  LEFT VENTRICLE PLAX 2D LVIDd:         4.00 cm     Diastology LVIDs:         3.60 cm     LV e' medial:    5.98 cm/s LV PW:         1.20 cm     LV E/e' medial:  20.9 LV IVS:        1.00 cm     LV e' lateral:   4.90 cm/s LVOT diam:     1.80 cm     LV E/e' lateral: 25.5 LV SV:         42 LV SV Index:   23 LVOT Area:     2.54 cm  LV Volumes (MOD) LV vol d, MOD A2C: 89.5 ml LV vol s, MOD A2C: 57.1 ml LV SV  MOD A2C:     32.4 ml RIGHT VENTRICLE             IVC RV S prime:      12.30 cm/s  IVC diam: 2.10 cm TAPSE (M-mode): 1.7 cm LEFT ATRIUM             Index       RIGHT ATRIUM           Index LA diam:        3.50 cm 1.92 cm/m  RA Area:     10.00 cm LA Vol (A2C):   55.1 ml 30.22 ml/m RA Volume:   19.90 ml  10.91 ml/m LA Vol (A4C):   41.3 ml 22.65 ml/m LA Biplane Vol: 48.6 ml 26.66 ml/m  AORTIC VALVE LVOT Vmax:   86.60 cm/s LVOT Vmean:  58.300 cm/s LVOT VTI:    0.166 m  AORTA Ao Root diam: 2.70 cm Ao Asc diam:  2.90 cm MV E velocity: 125.00 cm/s  TRICUSPID VALVE MV A velocity: 114.00 cm/s  TR Peak grad:   46.0 mmHg MV E/A ratio:  1.10         TR Vmax:        339.00 cm/s                              SHUNTS                             Systemic VTI:  0.17 m                             Systemic Diam: 1.80 cm Rudean Haskell MD Electronically signed by Rudean Haskell MD Signature Date/Time: 06/21/2020/4:10:56 PM    Final     (Echo, Carotid, EGD, Colonoscopy, ERCP)    Subjective: Patient seen and examined.  She was walking in the hallway with physical therapy.  Denies any complaints.  Denies any chest pain or shortness of breath.  She was on 1 to 2 L of oxygen since yesterday morning, however today morning she is walked with adequate saturations and without shortness of breath. Eager to go home.  She will exercise on her own and does not need therapist to go home.  Lives at home with sister and feels safe.   Discharge Exam: Vitals:   06/26/20 0510 06/26/20 0800  BP: 116/66   Pulse: 82   Resp: 20   Temp: 97.9 F (36.6 C)   SpO2: 96% 94%   Vitals:   06/25/20 1935 06/26/20 0510 06/26/20 0556 06/26/20 0800  BP: 96/67 116/66    Pulse: 69 82    Resp: 18 20    Temp: 98.2 F (36.8 C) 97.9 F (36.6 C)    TempSrc: Oral Oral    SpO2: 97% 96%  94%  Weight:   74 kg   Height:        General: Pt is alert, awake, not in acute distress Cardiovascular: RRR, S1/S2 +, no rubs, no gallops Respiratory: CTA bilaterally, no wheezing, no rhonchi Abdominal: Soft, NT,  ND, bowel sounds + Extremities: no edema, no cyanosis    The results of significant diagnostics from this hospitalization (including imaging, microbiology, ancillary and laboratory) are listed below for reference.     Microbiology: Recent Results (from the past 240 hour(s))  Resp Panel by RT-PCR (Flu  A&B, Covid) Nasopharyngeal Swab     Status: None   Collection Time: 06/19/20  1:43 AM   Specimen: Nasopharyngeal Swab; Nasopharyngeal(NP) swabs in vial transport medium  Result Value Ref Range Status   SARS Coronavirus 2 by RT PCR NEGATIVE NEGATIVE Final    Comment: (NOTE) SARS-CoV-2 target nucleic acids are NOT DETECTED.  The SARS-CoV-2 RNA is generally detectable in upper respiratory specimens during the acute phase of infection. The lowest concentration of SARS-CoV-2 viral copies this assay can detect is 138 copies/mL. A negative result does not preclude SARS-Cov-2 infection and should not be used as the sole basis for treatment or other patient management decisions. A negative result may occur with  improper specimen collection/handling, submission of specimen other than nasopharyngeal swab, presence of viral mutation(s) within the areas targeted by this assay, and inadequate number of viral copies(<138 copies/mL). A negative result must be combined with clinical observations, patient history, and epidemiological information. The expected result is Negative.  Fact Sheet for Patients:  EntrepreneurPulse.com.au  Fact Sheet for Healthcare Providers:  IncredibleEmployment.be  This test is no t yet approved or cleared by the [place] FDA and  has been authorized for detection and/or diagnosis of SARS-CoV-2 by FDA under an Emergency Use Authorization (EUA). This EUA will remain  in effect (meaning this test can be used) for the duration of the COVID-19 declaration under Section 564(b)(1) of the Act, 21 U.S.C.section 360bbb-3(b)(1), unless  the authorization is terminated  or revoked sooner.       Influenza A by PCR NEGATIVE NEGATIVE Final   Influenza B by PCR NEGATIVE NEGATIVE Final    Comment: (NOTE) The Xpert Xpress SARS-CoV-2/FLU/RSV plus assay is intended as an aid in the diagnosis of influenza from Nasopharyngeal swab specimens and should not be used as a sole basis for treatment. Nasal washings and aspirates are unacceptable for Xpert Xpress SARS-CoV-2/FLU/RSV testing.  Fact Sheet for Patients: EntrepreneurPulse.com.au  Fact Sheet for Healthcare Providers: IncredibleEmployment.be  This test is not yet approved or cleared by the [place] FDA and has been authorized for detection and/or diagnosis of SARS-CoV-2 by FDA under an Emergency Use Authorization (EUA). This EUA will remain in effect (meaning this test can be used) for the duration of the COVID-19 declaration under Section 564(b)(1) of the Act, 21 U.S.C. section 360bbb-3(b)(1), unless the authorization is terminated or revoked.  Performed at [facility], [place] [address] [place], [place] [address]   MRSA PCR Screening     Status: None   Collection Time: 06/19/20  8:08 AM   Specimen: Nasal Mucosa; Nasopharyngeal  Result Value Ref Range Status   MRSA by PCR NEGATIVE NEGATIVE Final    Comment:        The GeneXpert MRSA Assay (FDA approved for NASAL specimens only), is one component of a comprehensive MRSA colonization surveillance program. It is not intended to diagnose MRSA infection nor to guide or monitor treatment for MRSA infections. Performed at [facility], [place] [address] [place], [place] [address]   Culture, respiratory     Status: None   Collection Time: 06/19/20  1:52 PM   Specimen: Tracheal Aspirate; Respiratory  Result Value Ref Range Status   Specimen Description TRACHEAL ASPIRATE  Final   Special Requests NONE  Final   Gram Stain   Final    FEW WBC PRESENT, PREDOMINANTLY  PMN RARE GRAM POSITIVE COCCI    Culture   Final    RARE Normal respiratory flora-no Staph aureus  or Pseudomonas seen Performed at [facility], [place] [address] [place], [place] [address]    Report Status 06/22/2020 FINAL  Final     Labs: BNP (last 3 results) Recent Labs    06/19/20 0110 06/22/20 0105  BNP 287.0* [id number]*   Basic Metabolic Panel: Recent Labs  Lab 06/22/20 0105 06/23/20 0115 06/24/20 0112 06/25/20 0154 06/26/20 0532  NA 139 141 138 135 136  K 3.8 3.7 4.0 3.7 3.3*  CL 103 102 98 93* 97*  CO2 23 25 26 28 28   GLUCOSE 113* 145* 112* 106* 91  BUN 13 28* 40* 39* 32*  CREATININE 1.17* 1.13* 1.28* 1.38* 1.29*  CALCIUM 8.6* 9.0 9.7 9.5 8.9  MG  --  2.1 2.3 2.1 2.2  PHOS  --  3.9 6.2* 4.9* 3.8   Liver Function Tests: No results for input(s): AST, ALT, ALKPHOS, BILITOT, PROT, ALBUMIN in the last 168 hours. No results for input(s): LIPASE, AMYLASE in the last 168 hours. No results for input(s): AMMONIA in the last 168 hours. CBC: Recent Labs  Lab 06/22/20 0105 06/23/20 0115 06/24/20 0112 06/25/20 0154 06/26/20 0532  WBC 11.9* 8.0 10.2 10.7* 10.6*  HGB 11.1* 11.1* 12.5 12.9 13.1  HCT 33.7* 33.5* 36.8 37.2 38.4  MCV 91.8 90.8 91.1 89.0 89.3  PLT 142* 166 188 229 244   Cardiac Enzymes: Recent Labs  Lab 06/20/20 1941 06/21/20 0231  CKTOTAL 169 97   BNP: Invalid input(s): POCBNP CBG: Recent Labs  Lab 06/23/20 2017 06/23/20 2327 06/24/20 0428 06/24/20 0641 06/24/20 1122  GLUCAP 119* 132* 115* 122* 109*   D-Dimer No results for input(s): DDIMER in the last 72 hours. Hgb A1c No results for input(s): HGBA1C in the last 72 hours. Lipid Profile Recent Labs    06/24/20 0112 06/25/20 0154  TRIG 261* 304*   Thyroid function studies No results for input(s): TSH, T4TOTAL, T3FREE, THYROIDAB in the last 72 hours.  Invalid input(s): FREET3 Anemia work up No results for input(s): VITAMINB12, FOLATE, FERRITIN, TIBC, IRON, RETICCTPCT in the  last 72 hours. Urinalysis    Component Value Date/Time   COLORURINE YELLOW 06/21/2020 0922   APPEARANCEUR CLEAR 06/21/2020 0922   LABSPEC 1.014 06/21/2020 0922   PHURINE 9.0 (H) 06/21/2020 0922   GLUCOSEU NEGATIVE 06/21/2020 0922   HGBUR NEGATIVE 06/21/2020 0922   BILIRUBINUR NEGATIVE 06/21/2020 0922   KETONESUR 5 (A) 06/21/2020 0922   PROTEINUR 30 (A) 06/21/2020 0922   NITRITE NEGATIVE 06/21/2020 0922   LEUKOCYTESUR NEGATIVE 06/21/2020 0922   Sepsis Labs Invalid input(s): PROCALCITONIN,  WBC,  LACTICIDVEN Microbiology Recent Results (from the past 240 hour(s))  Resp Panel by RT-PCR (Flu A&B, Covid) Nasopharyngeal Swab     Status: None   Collection Time: 06/19/20  1:43 AM   Specimen: Nasopharyngeal Swab; Nasopharyngeal(NP) swabs in vial transport medium  Result Value Ref Range Status   SARS Coronavirus 2 by RT PCR NEGATIVE NEGATIVE Final    Comment: (NOTE) SARS-CoV-2 target nucleic acids are NOT DETECTED.  The SARS-CoV-2 RNA is generally detectable in upper respiratory specimens during the acute phase of infection. The lowest concentration of SARS-CoV-2 viral copies this assay can detect is 138 copies/mL. A negative result does not preclude SARS-Cov-2 infection and should not be used as the sole basis for treatment or other patient management decisions. A negative result may occur with  improper specimen collection/handling, submission of specimen other than nasopharyngeal swab, presence of viral mutation(s) within the areas targeted by this assay, and inadequate  number of viral copies(<138 copies/mL). A negative result must be combined with clinical observations, patient history, and epidemiological information. The expected result is Negative.  Fact Sheet for Patients:  EntrepreneurPulse.com.au  Fact Sheet for Healthcare Providers:  IncredibleEmployment.be  This test is no t yet approved or cleared by the [place] FDA and  has  been authorized for detection and/or diagnosis of SARS-CoV-2 by FDA under an Emergency Use Authorization (EUA). This EUA will remain  in effect (meaning this test can be used) for the duration of the COVID-19 declaration under Section 564(b)(1) of the Act, 21 U.S.C.section 360bbb-3(b)(1), unless the authorization is terminated  or revoked sooner.       Influenza A by PCR NEGATIVE NEGATIVE Final   Influenza B by PCR NEGATIVE NEGATIVE Final    Comment: (NOTE) The Xpert Xpress SARS-CoV-2/FLU/RSV plus assay is intended as an aid in the diagnosis of influenza from Nasopharyngeal swab specimens and should not be used as a sole basis for treatment. Nasal washings and aspirates are unacceptable for Xpert Xpress SARS-CoV-2/FLU/RSV testing.  Fact Sheet for Patients: EntrepreneurPulse.com.au  Fact Sheet for Healthcare Providers: IncredibleEmployment.be  This test is not yet approved or cleared by the [place] FDA and has been authorized for detection and/or diagnosis of SARS-CoV-2 by FDA under an Emergency Use Authorization (EUA). This EUA will remain in effect (meaning this test can be used) for the duration of the COVID-19 declaration under Section 564(b)(1) of the Act, 21 U.S.C. section 360bbb-3(b)(1), unless the authorization is terminated or revoked.  Performed at [facility], [place] [address], [place], [place] [address]   MRSA PCR Screening     Status: None   Collection Time: 06/19/20  8:08 AM   Specimen: Nasal Mucosa; Nasopharyngeal  Result Value Ref Range Status   MRSA by PCR NEGATIVE NEGATIVE Final    Comment:        The GeneXpert MRSA Assay (FDA approved for NASAL specimens only), is one component of a comprehensive MRSA colonization surveillance program. It is not intended to diagnose MRSA infection nor to guide or monitor treatment for MRSA infections. Performed at [facility], [place] [address], [place], [place]  [address]   Culture, respiratory     Status: None   Collection Time: 06/19/20  1:52 PM   Specimen: Tracheal Aspirate; Respiratory  Result Value Ref Range Status   Specimen Description TRACHEAL ASPIRATE  Final   Special Requests NONE  Final   Gram Stain   Final    FEW WBC PRESENT, PREDOMINANTLY PMN RARE GRAM POSITIVE COCCI    Culture   Final    RARE Normal respiratory flora-no Staph aureus or Pseudomonas seen Performed at [facility], [address], [place], [place] [address]    Report Status 06/22/2020 FINAL  Final     Time coordinating discharge: 40 minutes  SIGNED:   Barb Merino, MD  [facility] 06/26/2020, 10:21 AM

## 2020-06-26 NOTE — Progress Notes (Signed)
Physical Therapy Treatment Patient Details Name: [name] [name] MRN: [id number] DOB: 29-Sep-1960 Today's Date: 06/26/2020    History of Present Illness 60 yo F with hx of CHB ss/p MDT, DM with HTN, HFmrEF EF 40-45%, seizure disorder seen for acute hypoxic hypercarbic respiratory failure in the setting of aspiration PNA. Pt intubated 1/12-1/13 and then 1/14 -1/16.    PT Comments    Pt tolerates treatment well although remains limited by fatigue compared to baseline. Pt is able to transfer and ambulate without physical assistance and has good awareness of her deficits, electing to utilize a RW due to weakness and for energy conservation purposes. Pt will continue to benefit from acute PT POC to improve balance and activity tolerance. PT continues to recommend HHPT however the pt may decline these services.   Follow Up Recommendations  Home health PT;Supervision/Assistance - 24 hour (pt may decline HHPT services)     Equipment Recommendations  None recommended by PT    Recommendations for Other Services       Precautions / Restrictions Precautions Precautions: Fall Restrictions Weight Bearing Restrictions: No    Mobility  Bed Mobility Overal bed mobility: Modified Independent Bed Mobility: Sit to Supine;Supine to Sit     Supine to sit: Modified independent (Device/Increase time) Sit to supine: Modified independent (Device/Increase time)   General bed mobility comments: increased time  Transfers Overall transfer level: Needs assistance Equipment used: None Transfers: Sit to/from Stand Sit to Stand: Supervision            Ambulation/Gait Ambulation/Gait assistance: Supervision Gait Distance (Feet): 80 Feet Assistive device: Rolling walker (2 wheeled) Gait Pattern/deviations: Step-through pattern Gait velocity: reduced Gait velocity interpretation: <1.8 ft/sec, indicate of risk for recurrent falls General Gait Details: pt with slowed step-through gait, no LOB  noted   Stairs             Wheelchair Mobility    Modified Rankin (Stroke Patients Only)       Balance Overall balance assessment: Needs assistance Sitting-balance support: No upper extremity supported;Feet supported Sitting balance-Leahy Scale: Good     Standing balance support: No upper extremity supported;During functional activity Standing balance-Leahy Scale: Poor Standing balance comment: able to don mask without UE support, otherwise dependent on UE support of RW                            Cognition Arousal/Alertness: Awake/alert Behavior During Therapy: WFL for tasks assessed/performed Overall Cognitive Status: Within Functional Limits for tasks assessed                                        Exercises      General Comments General comments (skin integrity, edema, etc.): VSS on RA      Pertinent Vitals/Pain Pain Assessment: No/denies pain    Home Living                      Prior Function            PT Goals (current goals can now be found in the care plan section) Acute Rehab PT Goals Patient Stated Goal: to go home Progress towards PT goals: Progressing toward goals    Frequency    Min 3X/week      PT Plan Current plan remains appropriate    Co-evaluation  AM-PAC PT "6 Clicks" Mobility   Outcome Measure  Help needed turning from your back to your side while in a flat bed without using bedrails?: None Help needed moving from lying on your back to sitting on the side of a flat bed without using bedrails?: None Help needed moving to and from a bed to a chair (including a wheelchair)?: A Little Help needed standing up from a chair using your arms (e.g., wheelchair or bedside chair)?: A Little Help needed to walk in hospital room?: A Little Help needed climbing 3-5 steps with a railing? : A Little 6 Click Score: 20    End of Session   Activity Tolerance: Patient tolerated  treatment well Patient left: in bed;with call bell/phone within reach;with bed alarm set Nurse Communication: Mobility status PT Visit Diagnosis: Muscle weakness (generalized) (M62.81)     Time: 7989-2119 PT Time Calculation (min) (ACUTE ONLY): 10 min  Charges:  $Gait Training: 8-22 mins                     Zenaida Niece, PT, DPT Acute Rehabilitation Pager: [phone]    Zenaida Niece 06/26/2020, 10:45 AM

## 2020-06-26 NOTE — TOC Transition Note (Signed)
Transition of Care [facility]) - CM/SW Discharge Note   Patient Details  Name: [name] [name] MRN: [id number] Date of Birth: 1960/11/28  [facility] CM/SW Contact:  Geralynn Ochs, LCSW Phone Number: 06/26/2020, 11:47 AM   Clinical Narrative:   CSW met with patient to discuss recommendation for home health PT. Patient not interested, already has a walker at home, and brother here to pick her up. No other needs identified, CSW signing off.    Final next level of care: Home/Self Care Barriers to Discharge: Barriers Resolved   Patient Goals and CMS Choice Patient states their goals for this hospitalization and ongoing recovery are:: to get home CMS Medicare.gov Compare Post Acute Care list provided to:: Patient Choice offered to / list presented to : Patient  Discharge Placement                       Discharge Plan and Services                                     Social Determinants of Health (SDOH) Interventions     Readmission Risk Interventions No flowsheet data found.

## 2020-06-26 NOTE — Progress Notes (Signed)
PROGRESS NOTE    [name] [name]  RUE:[id number] DOB: 02/03/1961 DOA: 06/19/2020 PCP: Aretta Nip, MD    Brief Narrative:  60 year old female with history of complete heart block status post pacemaker, type 2 diabetes, hypertension, heart failure with mildly reduced ejection fraction 40-45% and seizure disorder who was admitted to the intensive care unit on 06/19/2020 with shortness of breath and cough, episode of seizure with EMS prompting intubation and mechanical ventilation.  As per report, she was complaining of shortness of breath and cough for some time, took some cough syrup before going to bed and after few hours she was noted to be struggling to breathe.  Sister called EMS.  EMS found her to be hypoxic with oxygen 60%, they started her on bag valve ventilation and brought into the emergency department where she was unresponsive and was immediately intubated. Mechanical ventilation 1/12-1/13 and transport to medical bed. Reintubated 1/14- 1/16, weaned to nasal cannula oxygen. Influenza and COVID-19 negative. CT head normal. Chest x-ray with dense right upper lobe infiltrate or opacity. Treated as heart failure and aspiration pneumonia, stabilized and [facility].   Assessment & Plan:   Active Problems:   Acute respiratory failure (HCC)   Seizure (HCC)   CHB (complete heart block) (HCC)   HFrEF (heart failure with reduced ejection fraction) (HCC)  Acute hypoxemic and hypercapnic respiratory failure: Secondary to aspiration pneumonia and volume overload. Reintubation.  Seizure disorder: Previous extensive investigations.  Currently on Lamictal and Trileptal.  Acute metabolic toxic encephalopathy:  Dilated cardiomyopathy with mildly reduced ejection fraction:  Complete heart block status post pacemaker:  Patient is discharged home.  See discharge summary.    DVT prophylaxis: heparin injection 5,000 Units Start: 06/19/20  0600 SCDs Start: 06/19/20 0318   Code Status: Full code Family Communication: None.  Patient will talk to her sister. Disposition Plan: Status is: Inpatient         Consultants:   PCCM  Cardiology  Procedures:   Mechanical intubation  Antimicrobials:   Augmentin 1/12-1/13   Subjective: Walked around.  Wants to go home.  Objective: Vitals:   06/25/20 1713 06/25/20 1935 06/26/20 0510 06/26/20 0556  BP: 108/60 96/67 116/66   Pulse: 60 69 82   Resp:  18 20   Temp: 98.8 F (37.1 C) 98.2 F (36.8 C) 97.9 F (36.6 C)   TempSrc:  Oral Oral   SpO2: 98% 97% 96%   Weight:    74 kg  Height:       No intake or output data in the 24 hours ending 06/26/20 0746 Filed Weights   06/24/20 0433 06/25/20 0500 06/26/20 0556  Weight: 73.9 kg 72.8 kg 74 kg    Examination:  Comfortably walking in the hallway.    Data Reviewed: I have personally reviewed following labs and imaging studies  CBC: Recent Labs  Lab 06/22/20 0105 06/23/20 0115 06/24/20 0112 06/25/20 0154 06/26/20 0532  WBC 11.9* 8.0 10.2 10.7* 10.6*  HGB 11.1* 11.1* 12.5 12.9 13.1  HCT 33.7* 33.5* 36.8 37.2 38.4  MCV 91.8 90.8 91.1 89.0 89.3  PLT 142* 166 188 229 147   Basic Metabolic Panel: Recent Labs  Lab 06/22/20 0105 06/23/20 0115 06/24/20 0112 06/25/20 0154 06/26/20 0532  NA 139 141 138 135 136  K 3.8 3.7 4.0 3.7 3.3*  CL 103 102 98 93* 97*  CO2 23 25 26 28 28   GLUCOSE 113* 145* 112* 106* 91  BUN 13 28* 40*  39* 32*  CREATININE 1.17* 1.13* 1.28* 1.38* 1.29*  CALCIUM 8.6* 9.0 9.7 9.5 8.9  MG  --  2.1 2.3 2.1 2.2  PHOS  --  3.9 6.2* 4.9* 3.8   GFR: Estimated Creatinine Clearance: 44.7 mL/min (A) (by C-G formula based on SCr of 1.29 mg/dL (H)). Liver Function Tests: No results for input(s): AST, ALT, ALKPHOS, BILITOT, PROT, ALBUMIN in the last 168 hours. No results for input(s): LIPASE, AMYLASE in the last 168 hours. No results for input(s): AMMONIA in the last 168  hours. Coagulation Profile: No results for input(s): INR, PROTIME in the last 168 hours. Cardiac Enzymes: Recent Labs  Lab 06/20/20 1941 06/21/20 0231  CKTOTAL 169 97   BNP (last 3 results) No results for input(s): PROBNP in the last 8760 hours. HbA1C: No results for input(s): HGBA1C in the last 72 hours. CBG: Recent Labs  Lab 06/23/20 2017 06/23/20 2327 06/24/20 0428 06/24/20 0641 06/24/20 1122  GLUCAP 119* 132* 115* 122* 109*   Lipid Profile: Recent Labs    06/24/20 0112 06/25/20 0154  TRIG 261* 304*   Thyroid Function Tests: No results for input(s): TSH, T4TOTAL, FREET4, T3FREE, THYROIDAB in the last 72 hours. Anemia Panel: No results for input(s): VITAMINB12, FOLATE, FERRITIN, TIBC, IRON, RETICCTPCT in the last 72 hours. Sepsis Labs: Recent Labs  Lab 06/19/20 0847 06/19/20 1050 06/21/20 1200 06/22/20 0105 06/23/20 0115  PROCALCITON  --   --  1.03 1.15 0.86  LATICACIDVEN 2.8* 2.2*  --   --   --     Recent Results (from the past 240 hour(s))  Resp Panel by RT-PCR (Flu A&B, Covid) Nasopharyngeal Swab     Status: None   Collection Time: 06/19/20  1:43 AM   Specimen: Nasopharyngeal Swab; Nasopharyngeal(NP) swabs in vial transport medium  Result Value Ref Range Status   SARS Coronavirus 2 by RT PCR NEGATIVE NEGATIVE Final    Comment: (NOTE) SARS-CoV-2 target nucleic acids are NOT DETECTED.  The SARS-CoV-2 RNA is generally detectable in upper respiratory specimens during the acute phase of infection. The lowest concentration of SARS-CoV-2 viral copies this assay can detect is 138 copies/mL. A negative result does not preclude SARS-Cov-2 infection and should not be used as the sole basis for treatment or other patient management decisions. A negative result may occur with  improper specimen collection/handling, submission of specimen other than nasopharyngeal swab, presence of viral mutation(s) within the areas targeted by this assay, and inadequate number  of viral copies(<138 copies/mL). A negative result must be combined with clinical observations, patient history, and epidemiological information. The expected result is Negative.  Fact Sheet for Patients:  EntrepreneurPulse.com.au  Fact Sheet for Healthcare Providers:  IncredibleEmployment.be  This test is no t yet approved or cleared by the [place] FDA and  has been authorized for detection and/or diagnosis of SARS-CoV-2 by FDA under an Emergency Use Authorization (EUA). This EUA will remain  in effect (meaning this test can be used) for the duration of the COVID-19 declaration under Section 564(b)(1) of the Act, 21 U.S.C.section 360bbb-3(b)(1), unless the authorization is terminated  or revoked sooner.       Influenza A by PCR NEGATIVE NEGATIVE Final   Influenza B by PCR NEGATIVE NEGATIVE Final    Comment: (NOTE) The Xpert Xpress SARS-CoV-2/FLU/RSV plus assay is intended as an aid in the diagnosis of influenza from Nasopharyngeal swab specimens and should not be used as a sole basis for treatment. Nasal washings and aspirates are unacceptable for Xpert Xpress  SARS-CoV-2/FLU/RSV testing.  Fact Sheet for Patients: EntrepreneurPulse.com.au  Fact Sheet for Healthcare Providers: IncredibleEmployment.be  This test is not yet approved or cleared by the [place] FDA and has been authorized for detection and/or diagnosis of SARS-CoV-2 by FDA under an Emergency Use Authorization (EUA). This EUA will remain in effect (meaning this test can be used) for the duration of the COVID-19 declaration under Section 564(b)(1) of the Act, 21 U.S.C. section 360bbb-3(b)(1), unless the authorization is terminated or revoked.  Performed at [facility], [place] [address], [place], [place] [address]   MRSA PCR Screening     Status: None   Collection Time: 06/19/20  8:08 AM   Specimen: Nasal Mucosa;  Nasopharyngeal  Result Value Ref Range Status   MRSA by PCR NEGATIVE NEGATIVE Final    Comment:        The GeneXpert MRSA Assay (FDA approved for NASAL specimens only), is one component of a comprehensive MRSA colonization surveillance program. It is not intended to diagnose MRSA infection nor to guide or monitor treatment for MRSA infections. Performed at [facility], [place] [address], [place], [place] [address]   Culture, respiratory     Status: None   Collection Time: 06/19/20  1:52 PM   Specimen: Tracheal Aspirate; Respiratory  Result Value Ref Range Status   Specimen Description TRACHEAL ASPIRATE  Final   Special Requests NONE  Final   Gram Stain   Final    FEW WBC PRESENT, PREDOMINANTLY PMN RARE GRAM POSITIVE COCCI    Culture   Final    RARE Normal respiratory flora-no Staph aureus or Pseudomonas seen Performed at [facility], [address] [place], [place] [address]    Report Status 06/22/2020 FINAL  Final         Radiology Studies: No results found.      Scheduled Meds:  carvedilol  6.25 mg Oral BID WC   Chlorhexidine Gluconate Cloth  6 each Topical Daily   COVID-19 mRNA Vac-TriS ([employer])  0.3 mL Intramuscular Once   escitalopram  20 mg Oral QHS   feeding supplement  237 mL Oral BID BM   fluticasone  2 spray Each Nare Daily   fluticasone furoate-vilanterol  1 puff Inhalation Daily   furosemide  20 mg Oral Daily   heparin  5,000 Units Subcutaneous Q8H   hydrOXYzine  10 mg Oral TID   lamoTRIgine  200 mg Oral BID   And   lamoTRIgine  50 mg Oral QHS   loratadine  10 mg Oral Daily   losartan  12.5 mg Oral Daily   montelukast  10 mg Oral QHS   multivitamin with minerals  1 tablet Oral Daily   OXcarbazepine  300 mg Oral BID   pantoprazole  40 mg Oral Daily   potassium chloride  40 mEq Oral Daily   ramelteon  8 mg Oral QHS   revefenacin  175 mcg Nebulization Daily   sodium chloride flush  10-40 mL Intracatheter Q12H    Continuous Infusions:  sodium chloride Stopped (06/19/20 2317)     LOS: 7 days    Time spent: 30 minutes    Barb Merino, MD [facility] Pager [phone]

## 2020-06-27 ENCOUNTER — Encounter (HOSPITAL_COMMUNITY): Payer: Self-pay

## 2020-07-03 DIAGNOSIS — C439 Malignant melanoma of skin, unspecified: Secondary | ICD-10-CM | POA: Diagnosis not present

## 2020-07-03 DIAGNOSIS — J690 Pneumonitis due to inhalation of food and vomit: Secondary | ICD-10-CM | POA: Diagnosis not present

## 2020-07-03 DIAGNOSIS — Z09 Encounter for follow-up examination after completed treatment for conditions other than malignant neoplasm: Secondary | ICD-10-CM | POA: Diagnosis not present

## 2020-07-03 DIAGNOSIS — Z8679 Personal history of other diseases of the circulatory system: Secondary | ICD-10-CM | POA: Diagnosis not present

## 2020-07-03 DIAGNOSIS — I509 Heart failure, unspecified: Secondary | ICD-10-CM | POA: Diagnosis not present

## 2020-07-03 DIAGNOSIS — G40909 Epilepsy, unspecified, not intractable, without status epilepticus: Secondary | ICD-10-CM | POA: Diagnosis not present

## 2020-07-03 DIAGNOSIS — F410 Panic disorder [episodic paroxysmal anxiety] without agoraphobia: Secondary | ICD-10-CM | POA: Diagnosis not present

## 2020-07-04 ENCOUNTER — Ambulatory Visit (INDEPENDENT_AMBULATORY_CARE_PROVIDER_SITE_OTHER): Payer: Medicare HMO

## 2020-07-04 DIAGNOSIS — I442 Atrioventricular block, complete: Secondary | ICD-10-CM | POA: Diagnosis not present

## 2020-07-04 LAB — CUP PACEART REMOTE DEVICE CHECK
Battery Remaining Longevity: 67 mo
Battery Voltage: 3 V
Brady Statistic AP VP Percent: 4.58 %
Brady Statistic AP VS Percent: 6.16 %
Brady Statistic AS VP Percent: 1.41 %
Brady Statistic AS VS Percent: 87.85 %
Brady Statistic RA Percent Paced: 10.74 %
Brady Statistic RV Percent Paced: 5.99 %
Date Time Interrogation Session: 20220127093153
Implantable Lead Implant Date: 20160707
Implantable Lead Implant Date: 20160707
Implantable Lead Location: 753859
Implantable Lead Location: 753860
Implantable Lead Model: 5076
Implantable Lead Model: 5076
Implantable Pulse Generator Implant Date: 20160707
Lead Channel Impedance Value: 361 Ohm
Lead Channel Impedance Value: 380 Ohm
Lead Channel Impedance Value: 646 Ohm
Lead Channel Impedance Value: 684 Ohm
Lead Channel Pacing Threshold Amplitude: 0.5 V
Lead Channel Pacing Threshold Amplitude: 0.5 V
Lead Channel Pacing Threshold Pulse Width: 0.4 ms
Lead Channel Pacing Threshold Pulse Width: 0.4 ms
Lead Channel Sensing Intrinsic Amplitude: 22 mV
Lead Channel Sensing Intrinsic Amplitude: 22 mV
Lead Channel Sensing Intrinsic Amplitude: 3.625 mV
Lead Channel Sensing Intrinsic Amplitude: 3.625 mV
Lead Channel Setting Pacing Amplitude: 1.5 V
Lead Channel Setting Pacing Amplitude: 2.5 V
Lead Channel Setting Pacing Pulse Width: 0.4 ms
Lead Channel Setting Sensing Sensitivity: 1.2 mV

## 2020-07-08 ENCOUNTER — Encounter: Payer: Self-pay | Admitting: Neurology

## 2020-07-08 ENCOUNTER — Ambulatory Visit: Payer: Medicare HMO | Admitting: Neurology

## 2020-07-08 ENCOUNTER — Encounter: Payer: Medicare HMO | Admitting: Student

## 2020-07-08 VITALS — BP 132/61 | HR 60 | Ht 62.0 in | Wt 160.4 lb

## 2020-07-08 DIAGNOSIS — R569 Unspecified convulsions: Secondary | ICD-10-CM

## 2020-07-08 DIAGNOSIS — Z5181 Encounter for therapeutic drug level monitoring: Secondary | ICD-10-CM

## 2020-07-08 NOTE — Progress Notes (Signed)
Reason for visit: Seizures  Referring physician: Dr. Leonette Monarch is a 60 y.o. female  History of present illness:  [name] [name] is a 60 year old left-handed white female with a history of a seizure disorder.  [name] [name] has been treated with Trileptal and lamotrigine, she has not had a seizure since December 2019.  Unfortunately, [name] [name] developed a gradual onset of shortness of breath several days prior to a June 19, 2020 admission to [name] hospital.  [name] [name] began having severe shortness of breath on [name] day of admission, EMS was called and [name] [name] had a seizure prior to going in [name] hospital.  She was noted to have oxygen saturations in [name] 60% range at that time, her white blood count was 18.6 when she went in [name] hospital.  She was felt to have an aspiration pneumonia and required intubation.  During [name] hospitalization, she was extubated and then had another seizure following extubation and then had to be reintubated again.  [name] [name] never had blood levels of [name] Trileptal or lamotrigine done on admission.  Her ammonia level is 147 with [name] upper range of normal being 35.  [name] [name] has since been discharged from [name] hospital, she remains on her current dose of [name] lamotrigine taking 200 mg twice daily and 50 mg in [name] evening and she is on Trileptal 300 mg twice daily.  Higher levels of Trileptal in [name] past have resulted in increased gait instability.  [name] [name] has not had any seizures since coming out of [name] hospital.  She is not operating a motor vehicle.  She reports some gait instability and fatigue and drowsiness at times, she has not had any falls.  She has not had any further seizures coming out of [name] hospital.  She reports occasional migratory head pains consistent with "ice pick pains".  She denies any numbness or weakness of [name] extremities.  She has a pacemaker in place, she cannot have MRI.  A CT scan of [name] brain was done in [name] hospital and was  unremarkable.  Past Medical History:  Diagnosis Date  . Arthritis   . Chronic insomnia 03/07/2015  . Diverticulitis   . GERD (gastroesophageal reflux disease)   . Gout   . Hiatal hernia   . LBBB (left bundle branch block)   . Pacemaker    [employer]   . Panic disorder 09/07/2014  . Pneumonia    x 1 - Walking  . Seizures ([place])    Last Seizure in 2017 - controlled with meds   . Sleep apnea    does not use a CPAP  . Wrist tendonitis    R     Past Surgical History:  Procedure Laterality Date  . COLONOSCOPY  last 03/31/2016  . COLONOSCOPY WITH PROPOFOL N/A 04/04/2019   Procedure: COLONOSCOPY WITH PROPOFOL;  Surgeon: Mauri Pole, MD;  Location: [facility];  Service: Endoscopy;  Laterality: N/A;  . COLONOSCOPY WITH PROPOFOL N/A 06/15/2019   Procedure: COLONOSCOPY WITH PROPOFOL;  Surgeon: Rush Landmark Telford Nab., MD;  Location: [facility];  Service: Gastroenterology;  Laterality: N/A;  . COLONOSCOPY WITH PROPOFOL N/A 02/28/2020   Procedure: COLONOSCOPY WITH PROPOFOL;  Surgeon: Rush Landmark Telford Nab., MD;  Location: [facility];  Service: Gastroenterology;  Laterality: N/A;  . ENDOROTOR  02/28/2020   Procedure: ZOXWRUEAV;  Surgeon: Mansouraty, Telford Nab., MD;  Location: [facility];  Service: Gastroenterology;;  . ENDOSCOPIC MUCOSAL RESECTION N/A 06/15/2019   Procedure: ENDOSCOPIC MUCOSAL RESECTION;  Surgeon: Lemar Lofty., MD;  [facility]COPY;  Service: Gastroenterology;  Laterality: N/A;  . ENDOSCOPIC MUCOSAL RESECTION N/A 02/28/2020   Procedure: ENDOSCOPIC MUCOSAL RESECTION;  Surgeon: Meridee Score Netty Starring., MD;  Location: [facility];  Service: Gastroenterology;  Laterality: N/A;  . EP IMPLANTABLE DEVICE N/A 12/13/2014   Procedure: Pacemaker Implant;  Surgeon: Duke Salvia, MD;  Location: [facility] INVASIVE CV LAB;  Service: Cardiovascular;  Laterality: N/A;  . HAND SURGERY    . HEMOSTASIS CLIP PLACEMENT  06/15/2019   Procedure: HEMOSTASIS CLIP PLACEMENT;   Surgeon: Lemar Lofty., MD;  Location: [facility] ENDOSCOPY;  Service: Gastroenterology;;  . HEMOSTASIS CLIP PLACEMENT  02/28/2020   Procedure: HEMOSTASIS CLIP PLACEMENT;  Surgeon: Lemar Lofty., MD;  Location: [facility];  Service: Gastroenterology;;  . Shayne Alken CONTROL  02/28/2020   Procedure: HEMOSTASIS CONTROL;  Surgeon: Lemar Lofty., MD;  Location: [facility];  Service: Gastroenterology;;  . POLYPECTOMY    . POLYPECTOMY  04/04/2019   Procedure: POLYPECTOMY;  Surgeon: Napoleon Form, MD;  Location: [facility];  Service: Endoscopy;;  . POLYPECTOMY  06/15/2019   Procedure: POLYPECTOMY;  Surgeon: Lemar Lofty., MD;  Location: [facility] ENDOSCOPY;  Service: Gastroenterology;;  . Sunnie Nielsen LIFTING INJECTION  06/15/2019   Procedure: SUBMUCOSAL LIFTING INJECTION;  Surgeon: Lemar Lofty., MD;  Location: [facility] ENDOSCOPY;  Service: Gastroenterology;;  . TUBAL LIGATION    . UPPER GI ENDOSCOPY    . WISDOM TOOTH EXTRACTION      Family History  Problem Relation Age of Onset  . Alzheimer's disease Mother   . Hypertension Mother   . Colon polyps Mother   . Hypertension Father   . Stroke Father   . Leukemia Father   . Prostate cancer Father   . Colon polyps Sister   . Stroke Brother   . Hypertension Brother   . Sleep apnea Brother   . Colon polyps Brother   . Kidney failure Sister   . Diabetes Sister   . Colon polyps Sister   . Coronary artery disease Maternal Grandfather   . Parkinson's disease Maternal Grandmother   . Colon cancer Paternal Aunt   . Esophageal cancer Neg Hx   . Stomach cancer Neg Hx   . Rectal cancer Neg Hx   . Inflammatory bowel disease Neg Hx   . Liver disease Neg Hx   . Pancreatic cancer Neg Hx     Social history:  reports that she has quit smoking. Her smoking use included cigarettes. She has a 22.50 pack-year smoking history. She has never used smokeless tobacco. She reports that she does not use drugs. No history on file  for alcohol use.  Medications:  Prior to Admission medications   Medication Sig Start Date End Date Taking? Authorizing Provider  acetaminophen (TYLENOL) 500 MG tablet Take 500-1,000 mg by mouth every 6 (six) hours as needed for moderate pain (pain).    Yes [provider]  albuterol (VENTOLIN HFA) 108 (90 Base) MCG/ACT inhaler Inhale 2 puffs into [name] lungs every 6 (six) hours as needed for wheezing or shortness of breath. 06/26/20  Yes Dorcas Carrow, MD  atorvastatin (LIPITOR) 20 MG tablet Take 20 mg by mouth daily. 04/01/20  Yes [provider]  carvedilol (COREG) 6.25 MG tablet Take 1 tablet (6.25 mg total) by mouth 2 (two) times daily with a meal. 06/26/20 09/24/20 Yes Ghimire, Lyndel Safe, MD  Cholecalciferol (VITAMIN D) 125 MCG (5000 UT) CAPS Take 5,000 Units by mouth daily.   Yes [provider]  Dextromethorphan-guaiFENesin (CORICIDIN HBP CONGESTION/COUGH PO) Take 1 tablet by mouth every 4 (four) hours as needed (cold symptoms).   Yes [provider]  escitalopram (LEXAPRO) 20 MG tablet Take 1 tablet (20 mg total) by mouth at bedtime. 06/26/20 09/24/20 Yes Barb Merino, MD  lamoTRIgine (LAMICTAL) 200 MG tablet Take 1 tablet (200 mg total) by mouth 2 (two) times daily. [name] taking differently: Take 200 mg by mouth 2 (two) times daily. 8am and 8 pm 07/25/19  Yes Lomax, Amy, NP  lamoTRIgine (LAMICTAL) 200 MG tablet Take 200 mg by mouth 2 (two) times daily.   Yes [provider]  lamoTRIgine (LAMICTAL) 25 MG tablet Take 50 mg by mouth at bedtime. 04/02/20  Yes [provider]  losartan (COZAAR) 25 MG tablet Take 0.5 tablets (12.5 mg total) by mouth daily. 06/27/20 09/25/20 Yes Ghimire, Dante Gang, MD  Oxcarbazepine (TRILEPTAL) 300 MG tablet Take 1 tablet (300 mg total) by mouth 2 (two) times daily. 07/25/19  Yes Lomax, Amy, NP  pantoprazole (PROTONIX) 40 MG tablet Take 1 tablet (40 mg total) by mouth daily. 06/27/20 07/27/20 Yes Ghimire, Dante Gang, MD   potassium chloride SA (KLOR-CON) 20 MEQ tablet Take 1 tablet (20 mEq total) by mouth daily. 06/27/20 07/27/20 Yes Barb Merino, MD  acetaminophen (TYLENOL) 500 MG tablet Take 1,000 mg by mouth every 6 (six) hours as needed for mild pain.    [provider]  Cholecalciferol (VITAMIN D3) 2000 units capsule Take 1 capsule (2,000 Units total) by mouth daily. 03/17/16   Golden Circle, FNP  dicyclomine (BENTYL) 20 MG tablet Take 1 tablet (20 mg total) by mouth 2 (two) times daily. 03/24/20   Alroy Bailiff, Margaux, PA-C  furosemide (LASIX) 20 MG tablet Take 1 tablet (20 mg total) by mouth daily. 06/27/20 09/25/20  Barb Merino, MD  lamoTRIgine (LAMICTAL) 200 MG tablet Take 200 mg by mouth 2 (two) times daily. 04/26/20   [provider]  lamoTRIgine (LAMICTAL) 25 MG tablet TAKE 2 TABLETS EVERY DAY [name] taking differently: Take 50 mg by mouth at bedtime. 01/11/20   Lomax, Amy, NP  Nutritional Supplements (ESTROVEN PO) Take 1 tablet by mouth daily.     [provider]  omeprazole (PRILOSEC) 20 MG capsule Take 1 capsule (20 mg total) by mouth daily. 05/24/19   Evelina Dun A, FNP  ondansetron (ZOFRAN ODT) 4 MG disintegrating tablet Take 1 tablet (4 mg total) by mouth every 8 (eight) hours as needed for nausea or vomiting. 03/24/20   Eustaquio Maize, PA-C  Oxcarbazepine (TRILEPTAL) 300 MG tablet Take 300 mg by mouth 2 (two) times daily. 04/26/20   [provider]  polyethylene glycol powder (GLYCOLAX/MIRALAX) 17 GM/SCOOP powder Take 255 g by mouth daily. 02/19/20   Mansouraty, Telford Nab., MD      Allergies  Allergen Reactions  . Zonisamide Anxiety    Panic Attacks  . Aspirin     sensitivity  . Aspirin     UNK reaction  . Ciprofloxacin     Seizures & it keeps her awake  . Ciprofloxacin     UNK reaction  . Codeine Hives  . Hydrocodone Hives  . Oxycodone Hives  . Penicillins Hives and Swelling    Did it involve swelling of [name] face/tongue/throat, SOB, or low  BP? Yes Did it involve sudden or severe rash/hives, skin peeling, or any reaction on [name] inside of your mouth or nose? Yes Did you need to seek medical attention at a hospital or doctor's office?  No When did it last happen?1990's If all above answers are "NO", may proceed with cephalosporin use.   Marland Kitchen Penicillins Hives and Swelling    Pt tolerated ampicillin/sulbactam without issues 06/2020    ROS:  Out of a complete 14 system review of symptoms, [name] [name] complains only of [name] following symptoms, and all other reviewed systems are negative.  Seizures Shortness of breath Fatigue  Blood pressure 132/61, pulse 60, height 5\' 2"  (1.575 m), weight 160 lb 6.4 oz (72.8 kg).  Physical Exam  General: [name] [name] is alert and cooperative at [name] time of [name] examination.  Eyes: Pupils are equal, round, and reactive to light. Discs are flat bilaterally.  Neck: [name] neck is supple, no carotid bruits are noted.  Respiratory: [name] respiratory examination is clear.  Cardiovascular: [name] cardiovascular examination reveals a regular rate and rhythm, no obvious murmurs or rubs are noted.  Skin: Extremities are without significant edema.  Neurologic Exam  Mental status: [name] [name] is alert and oriented x 3 at [name] time of [name] examination. [name] [name] has apparent normal recent and remote memory, with an apparently normal attention span and concentration ability.  Cranial nerves: Facial symmetry is present. There is good sensation of [name] face to pinprick and soft touch bilaterally. [name] strength of [name] facial muscles and [name] muscles to head turning and shoulder shrug are normal bilaterally. Speech is well enunciated, no aphasia or dysarthria is noted. Extraocular movements are full. Visual fields are full. [name] tongue is midline, and [name] [name] has symmetric elevation of [name] soft palate. No obvious hearing deficits are noted.  Motor: [name] motor testing reveals 5 over 5 strength of all 4 extremities.  Good symmetric motor tone is noted throughout.  Sensory: Sensory testing is intact to pinprick, soft touch and vibration sensation on all 4 extremities. No evidence of extinction is noted.  Coordination: Cerebellar testing reveals good finger-nose-finger and heel-to-shin bilaterally.  Gait and station: Gait is normal. Tandem gait is slightly unsteady. Romberg is negative. No drift is seen.  Reflexes: Deep tendon reflexes are symmetric, but [name] reflexes in [name] lower extremities are somewhat brisk. Toes are downgoing bilaterally.   CT brain 06/19/20:  IMPRESSION: No acute intracranial abnormality.  * CT scan images were reviewed online. I agree with [name] written report.    Assessment/Plan:  1.  History of seizures with recent recurrence  2.  Recent admission with pneumonia requiring intubation  [name] [name] has had 2 recent seizures associated with an intercurrent illness associated with hypoxia and elevated blood glucose levels.  [name] [name] is back on her usual dose of medications, she is doing better.  [name] [name] will be sent for blood work to check [name] levels of [name] Trileptal and lamotrigine.  [name] [name] is not to operate a motor vehicle for 6 months following [name] January admission.  She will follow up in 4 months, she will let me know if any further episodes occur.  [name] seizures themselves may have been brought on by hypoxia primarily.  Jill Alexanders MD 07/08/2020 8:27 AM  [facility] [address] [place], [place] [address]  Phone [phone] Fax [phone]

## 2020-07-10 DIAGNOSIS — N189 Chronic kidney disease, unspecified: Secondary | ICD-10-CM | POA: Diagnosis not present

## 2020-07-10 DIAGNOSIS — N179 Acute kidney failure, unspecified: Secondary | ICD-10-CM | POA: Diagnosis not present

## 2020-07-10 LAB — LAMOTRIGINE LEVEL: Lamotrigine Lvl: 10.7 ug/mL (ref 2.0–20.0)

## 2020-07-10 LAB — 10-HYDROXYCARBAZEPINE: Oxcarbazepine SerPl-Mcnc: 17 ug/mL (ref 10–35)

## 2020-07-10 NOTE — Progress Notes (Deleted)
Cardiology Office Note Date:  07/10/2020  Patient ID:  [name], [name] 02-01-1961, MRN [id number] PCP:  Aretta Nip, MD  Electrophysiologist: Dr. Caryl Comes  ***refresh   Chief Complaint: *** assess increase in pacing %, post hospital  History of Present Illness: [name] [name] is a 60 y.o. female with history of LBBB, CHB w/PPM, seizure d/o (follows with neurology), OSA *** no CPAP, HTN.  She comes today to be seen for Dr. Caryl Comes, last seen by him Jan 2021, at that time had been very liberal with sodium intake (perhaps had been recommended by someone) and she was edematous, and instructed to reduce her Na intake.  Noted 1%VP w/MVP on.  She was admitted to [facility] 06/19/20 with respiratory failure suspect 2/2 aspiration, in the ER Found hypoxic at 60%.  Brought to ED where patient had seizure-like episode and then become unresponsive requiring intubation on mechanical ventilator.  Treated with Zosyn initially. Hydrated for AKI.  Extubated the next day. Cardiology was consulted for new reduction in LVEF to 40-45% Not felt to have been volume OL EF change suspect 2/2 stress vs RV pacing. Signed off 06/25/20 Discharged 06/26/20 Aspiration penuonia   *** last remote only 6% *** volume? *** repeat echo in a couple months, likely stress induced *** meds, CM *** CPAP?    Device information MDT dual chamber PPM, implanted 12/13/2014   Past Medical History:  Diagnosis Date  . Arthritis   . Chronic insomnia 03/07/2015  . Diverticulitis   . GERD (gastroesophageal reflux disease)   . Gout   . Hiatal hernia   . LBBB (left bundle branch block)   . Pacemaker    Medtronic   . Panic disorder 09/07/2014  . Pneumonia    x 1 - Walking  . Seizures ([place])    Last Seizure in 2017 - controlled with meds   . Sleep apnea    does not use a CPAP  . Wrist tendonitis    R     Past Surgical History:  Procedure Laterality Date  . COLONOSCOPY  last 03/31/2016  . COLONOSCOPY WITH PROPOFOL  N/A 04/04/2019   Procedure: COLONOSCOPY WITH PROPOFOL;  Surgeon: Mauri Pole, MD;  Location: [facility];  Service: Endoscopy;  Laterality: N/A;  . COLONOSCOPY WITH PROPOFOL N/A 06/15/2019   Procedure: COLONOSCOPY WITH PROPOFOL;  Surgeon: Rush Landmark Telford Nab., MD;  Location: [place];  Service: Gastroenterology;  Laterality: N/A;  . COLONOSCOPY WITH PROPOFOL N/A 02/28/2020   Procedure: COLONOSCOPY WITH PROPOFOL;  Surgeon: Rush Landmark Telford Nab., MD;  [facility];  Service: Gastroenterology;  Laterality: N/A;  . ENDOROTOR  02/28/2020   Procedure: [id number];  Surgeon: Mansouraty, Telford Nab., MD;  Location: [facility];  Service: Gastroenterology;;  . ENDOSCOPIC MUCOSAL RESECTION N/A 06/15/2019   Procedure: ENDOSCOPIC MUCOSAL RESECTION;  Surgeon: Irving Copas., MD;  Location: [place];  Service: Gastroenterology;  Laterality: N/A;  . ENDOSCOPIC MUCOSAL RESECTION N/A 02/28/2020   Procedure: ENDOSCOPIC MUCOSAL RESECTION;  Surgeon: Rush Landmark Telford Nab., MD;  [facility];  Service: Gastroenterology;  Laterality: N/A;  . EP IMPLANTABLE DEVICE N/A 12/13/2014   Procedure: Pacemaker Implant;  Surgeon: Deboraha Sprang, MD;  Location: [place] [facility];  Service: Cardiovascular;  Laterality: N/A;  . HAND SURGERY    . HEMOSTASIS CLIP PLACEMENT  06/15/2019   Procedure: HEMOSTASIS CLIP PLACEMENT;  Surgeon: Irving Copas., MD;  Location: [place];  Service: Gastroenterology;;  . HEMOSTASIS CLIP PLACEMENT  02/28/2020   Procedure: HEMOSTASIS CLIP  PLACEMENT;  Surgeon: Rush Landmark Telford Nab., MD;  Location: [facility];  Service: Gastroenterology;;  . Laretta Alstrom CONTROL  02/28/2020   Procedure: HEMOSTASIS CONTROL;  Surgeon: Irving Copas., MD;  Location: [facility];  Service: Gastroenterology;;  . POLYPECTOMY    . POLYPECTOMY  04/04/2019   Procedure: POLYPECTOMY;  Surgeon: Mauri Pole, MD;  [facility];  Service: Endoscopy;;   . POLYPECTOMY  06/15/2019   Procedure: POLYPECTOMY;  Surgeon: Irving Copas., MD;  Location: [place];  Service: Gastroenterology;;  . Lia Foyer LIFTING INJECTION  06/15/2019   Procedure: SUBMUCOSAL LIFTING INJECTION;  Surgeon: Irving Copas., MD;  Location: [place];  Service: Gastroenterology;;  . TUBAL LIGATION    . UPPER GI ENDOSCOPY    . WISDOM TOOTH EXTRACTION      Current Outpatient Medications  Medication Sig Dispense Refill  . acetaminophen (TYLENOL) 500 MG tablet Take 500-1,000 mg by mouth every 6 (six) hours as needed for moderate pain (pain).     Marland Kitchen acetaminophen (TYLENOL) 500 MG tablet Take 1,000 mg by mouth every 6 (six) hours as needed for mild pain.    Marland Kitchen albuterol (VENTOLIN HFA) 108 (90 Base) MCG/ACT inhaler Inhale 2 puffs into the lungs every 6 (six) hours as needed for wheezing or shortness of breath. 8 g 2  . atorvastatin (LIPITOR) 20 MG tablet Take 20 mg by mouth daily.    . carvedilol (COREG) 6.25 MG tablet Take 1 tablet (6.25 mg total) by mouth 2 (two) times daily with a meal. 60 tablet 2  . Cholecalciferol (VITAMIN D) 125 MCG (5000 UT) CAPS Take 5,000 Units by mouth daily.    . Cholecalciferol (VITAMIN D3) 2000 units capsule Take 1 capsule (2,000 Units total) by mouth daily. 90 capsule 0  . Dextromethorphan-guaiFENesin (CORICIDIN HBP CONGESTION/COUGH PO) Take 1 tablet by mouth every 4 (four) hours as needed (cold symptoms).    Marland Kitchen escitalopram (LEXAPRO) 20 MG tablet Take 1 tablet (20 mg total) by mouth at bedtime. 30 tablet 2  . furosemide (LASIX) 20 MG tablet Take 1 tablet (20 mg total) by mouth daily. 30 tablet 2  . lamoTRIgine (LAMICTAL) 200 MG tablet Take 1 tablet (200 mg total) by mouth 2 (two) times daily. (Patient taking differently: Take 200 mg by mouth 2 (two) times daily. 8am and 8 pm) 180 tablet 3  . lamoTRIgine (LAMICTAL) 200 MG tablet Take 200 mg by mouth 2 (two) times daily.    Marland Kitchen lamoTRIgine (LAMICTAL) 200 MG tablet Take 200 mg by  mouth 2 (two) times daily.    Marland Kitchen lamoTRIgine (LAMICTAL) 25 MG tablet TAKE 2 TABLETS EVERY DAY (Patient taking differently: Take 50 mg by mouth at bedtime.) 180 tablet 1  . lamoTRIgine (LAMICTAL) 25 MG tablet Take 50 mg by mouth at bedtime.    Marland Kitchen losartan (COZAAR) 25 MG tablet Take 0.5 tablets (12.5 mg total) by mouth daily. 15 tablet 2  . Nutritional Supplements (ESTROVEN PO) Take 1 tablet by mouth daily.     Marland Kitchen omeprazole (PRILOSEC) 20 MG capsule Take 1 capsule (20 mg total) by mouth daily. 90 capsule 1  . Oxcarbazepine (TRILEPTAL) 300 MG tablet Take 1 tablet (300 mg total) by mouth 2 (two) times daily. 180 tablet 3  . Oxcarbazepine (TRILEPTAL) 300 MG tablet Take 300 mg by mouth 2 (two) times daily.    . pantoprazole (PROTONIX) 40 MG tablet Take 1 tablet (40 mg total) by mouth daily. 30 tablet 0  . polyethylene glycol powder (GLYCOLAX/MIRALAX) 17 GM/SCOOP powder  Take 255 g by mouth daily. 255 g 3  . potassium chloride SA (KLOR-CON) 20 MEQ tablet Take 1 tablet (20 mEq total) by mouth daily. 30 tablet 0   No current facility-administered medications for this visit.    Allergies:   Zonisamide, Aspirin, Aspirin, Ciprofloxacin, Ciprofloxacin, Codeine, Hydrocodone, Oxycodone, Penicillins, and Penicillins   Social History:  The patient  reports that she has quit smoking. Her smoking use included cigarettes. She has a 22.50 pack-year smoking history. She has never used smokeless tobacco. She reports that she does not use drugs.   Family History:  The patient's family history includes Alzheimer's disease in her mother; Colon cancer in her paternal aunt; Colon polyps in her brother, mother, sister, and sister; Coronary artery disease in her maternal grandfather; Diabetes in her sister; Hypertension in her brother, father, and mother; Kidney failure in her sister; Leukemia in her father; Parkinson's disease in her maternal grandmother; Prostate cancer in her father; Sleep apnea in her brother; Stroke in her  brother and father.***  ROS:  Please see the history of present illness.    All other systems are reviewed and otherwise negative.   PHYSICAL EXAM:  VS:  LMP  (LMP Unknown)  BMI: There is no height or weight on file to calculate BMI. Well nourished, well developed, in no acute distress HEENT: normocephalic, atraumatic Neck: no JVD, carotid bruits or masses Cardiac:  *** RRR; no significant murmurs, no rubs, or gallops Lungs:  *** CTA b/l, no wheezing, rhonchi or rales Abd: soft, nontender MS: no deformity or *** atrophy Ext: *** no edema Skin: warm and dry, no rash Neuro:  No gross deficits appreciated Psych: euthymic mood, full affect  *** PPM site is stable, no tethering or discomfort   EKG:  Not done today  Device interrogation done today and reviewed by myself:  ***   Transthoracic Echocardiogram: Date: 06/24/20 Results: 1. Left ventricular ejection fraction, by estimation, is 40 to 45%. The  left ventricle has mildly decreased function. The left ventricle  demonstrates global hypokinesis. There is mild concentric left ventricular  hypertrophy. Left ventricular diastolic  parameters are consistent with Grade II diastolic dysfunction  (pseudonormalization).  2. Right ventricular systolic function is normal. The right ventricular  size is normal. There is moderately elevated pulmonary artery systolic  pressure. The estimated right ventricular systolic pressure is 0000000 mmHg.  3. A small pericardial effusion is present.  4. The mitral valve is normal in structure. Trivial mitral valve  regurgitation.  5. Tricuspid valve regurgitation is mild to moderate.  6. The aortic valve is tricuspid. Aortic valve regurgitation is not  visualized. No aortic stenosis is present.  7. The inferior vena cava is dilated in size with >50% respiratory  variability, suggesting right atrial pressure of 8 mmHg.    Recent Labs: 06/19/2020: ALT 18 06/22/2020: B Natriuretic Peptide  477.8 06/26/2020: BUN 32; Creatinine, Ser 1.29; Hemoglobin 13.1; Magnesium 2.2; Platelets 244; Potassium 3.3; Sodium 136  06/25/2020: Triglycerides 304   Estimated Creatinine Clearance: 43.3 mL/min (A) (by C-G formula based on SCr of 1.29 mg/dL (H)).   Wt Readings from Last 3 Encounters:  07/08/20 160 lb 6.4 oz (72.8 kg)  06/26/20 163 lb 3.2 oz (74 kg)  02/28/20 167 lb 15.9 oz (76.2 kg)     Other studies reviewed: Additional studies/records reviewed today include: summarized above  ASSESSMENT AND PLAN:  1. PPM     ***  2. NICM     New, suspect stress induced     ***  VP%     *** limted echo in ***  3. HTN     ***    Disposition: F/u with ***  Current medicines are reviewed at length with the patient today.  The patient did not have any concerns regarding medicines.  Venetia Night, PA-C 07/10/2020 3:01 PM     [place] [place]  [place] [address] [phone] (office)  [phone] (fax)

## 2020-07-15 ENCOUNTER — Encounter: Payer: Medicare HMO | Admitting: Physician Assistant

## 2020-07-15 NOTE — Progress Notes (Signed)
Remote pacemaker transmission.   

## 2020-07-24 ENCOUNTER — Other Ambulatory Visit: Payer: Self-pay | Admitting: Neurology

## 2020-07-24 ENCOUNTER — Other Ambulatory Visit: Payer: Self-pay | Admitting: Adult Health

## 2020-07-24 NOTE — Progress Notes (Signed)
Cardiology Office Note Date:  07/29/2020  Patient ID:  Aisa, Schoeppner Aug 19, 1960, MRN [id number] PCP:  Aretta Nip, MD  Electrophysiologist: Dr. Caryl Comes    Chief Complaint: assess increase in pacing %, post hospital  History of Present Illness: KEREN ALVERIO is a 60 y.o. female with history of LBBB, CHB w/PPM, seizure d/o (follows with neurology), OSA no CPAP, HTN.  She comes today to be seen for Dr. Caryl Comes, last seen by him Jan 2021, at that time had been very liberal with sodium intake (perhaps had been recommended by someone) and she was edematous, and instructed to reduce her Na intake.  Noted 1%VP w/MVP on.  She was admitted to [facility] 06/19/20 with respiratory failure suspect 2/2 aspiration, in the ER Found hypoxic at 60%.  Brought to ED where patient had seizure-like episode and then become unresponsive requiring intubation on mechanical ventilator.  Treated with Zosyn initially. Hydrated for AKI.  Extubated the next day. Cardiology was consulted for new reduction in LVEF to 40-45% Not felt to have been volume OL EF change suspect 2/2 stress vs RV pacing. Signed off 06/25/20 Discharged 06/26/20 Aspiration penuonia   TODAY She is much better since her hospital stay No further seizures, states her neurologist feklt the seizures were lkely provoked by hypoxia. Breathing is good, no DOE, SOB, no symptoms of orthopnea or PND. No CP, palpitations Tolerating the medications without side effects   Device information MDT dual chamber PPM, implanted 12/13/2014   Past Medical History:  Diagnosis Date  . Arthritis   . Chronic insomnia 03/07/2015  . Diverticulitis   . GERD (gastroesophageal reflux disease)   . Gout   . Hiatal hernia   . LBBB (left bundle branch block)   . Pacemaker    Medtronic   . Panic disorder 09/07/2014  . Pneumonia    x 1 - Walking  . Seizures ([place])    Last Seizure in 2017 - controlled with meds   . Sleep apnea    does not use a CPAP  .  Wrist tendonitis    R     Past Surgical History:  Procedure Laterality Date  . COLONOSCOPY  last 03/31/2016  . COLONOSCOPY WITH PROPOFOL N/A 04/04/2019   Procedure: COLONOSCOPY WITH PROPOFOL;  Surgeon: Mauri Pole, MD;  [facility];  Service: Endoscopy;  Laterality: N/A;  . COLONOSCOPY WITH PROPOFOL N/A 06/15/2019   Procedure: COLONOSCOPY WITH PROPOFOL;  Surgeon: Rush Landmark Telford Nab., MD;  Location: [place];  Service: Gastroenterology;  Laterality: N/A;  . COLONOSCOPY WITH PROPOFOL N/A 02/28/2020   Procedure: COLONOSCOPY WITH PROPOFOL;  Surgeon: Rush Landmark Telford Nab., MD;  [facility];  Service: Gastroenterology;  Laterality: N/A;  . ENDOROTOR  02/28/2020   Procedure: HERDEYCXK;  Surgeon: Mansouraty, Telford Nab., MD;  Location: [facility];  Service: Gastroenterology;;  . ENDOSCOPIC MUCOSAL RESECTION N/A 06/15/2019   Procedure: ENDOSCOPIC MUCOSAL RESECTION;  Surgeon: Irving Copas., MD;  Location: [place];  Service: Gastroenterology;  Laterality: N/A;  . ENDOSCOPIC MUCOSAL RESECTION N/A 02/28/2020   Procedure: ENDOSCOPIC MUCOSAL RESECTION;  Surgeon: Rush Landmark Telford Nab., MD;  [facility];  Service: Gastroenterology;  Laterality: N/A;  . EP IMPLANTABLE DEVICE N/A 12/13/2014   Procedure: Pacemaker Implant;  Surgeon: Deboraha Sprang, MD;  Location: [place] [facility];  Service: Cardiovascular;  Laterality: N/A;  . HAND SURGERY    . HEMOSTASIS CLIP PLACEMENT  06/15/2019   Procedure: HEMOSTASIS CLIP PLACEMENT;  Surgeon: Irving Copas., MD;  Location: [facility];  Service: Gastroenterology;;  . HEMOSTASIS CLIP PLACEMENT  02/28/2020   Procedure: HEMOSTASIS CLIP PLACEMENT;  Surgeon: Irving Copas., MD;  Location: [facility];  Service: Gastroenterology;;  . Laretta Alstrom CONTROL  02/28/2020   Procedure: HEMOSTASIS CONTROL;  Surgeon: Irving Copas., MD;  [facility];  Service: Gastroenterology;;  .  POLYPECTOMY    . POLYPECTOMY  04/04/2019   Procedure: POLYPECTOMY;  Surgeon: Mauri Pole, MD;  [facility];  Service: Endoscopy;;  . POLYPECTOMY  06/15/2019   Procedure: POLYPECTOMY;  Surgeon: Irving Copas., MD;  Location: [place];  Service: Gastroenterology;;  . Lia Foyer LIFTING INJECTION  06/15/2019   Procedure: SUBMUCOSAL LIFTING INJECTION;  Surgeon: Irving Copas., MD;  Location: [place];  Service: Gastroenterology;;  . TUBAL LIGATION    . UPPER GI ENDOSCOPY    . WISDOM TOOTH EXTRACTION      Current Outpatient Medications  Medication Sig Dispense Refill  . acetaminophen (TYLENOL) 500 MG tablet Take 500-1,000 mg by mouth every 6 (six) hours as needed for moderate pain (pain).     Marland Kitchen albuterol (VENTOLIN HFA) 108 (90 Base) MCG/ACT inhaler Inhale 2 puffs into the lungs every 6 (six) hours as needed for wheezing or shortness of breath. 8 g 2  . atorvastatin (LIPITOR) 20 MG tablet Take 20 mg by mouth daily.    . carvedilol (COREG) 6.25 MG tablet Take 1 tablet (6.25 mg total) by mouth 2 (two) times daily with a meal. 60 tablet 2  . Cholecalciferol (VITAMIN D) 125 MCG (5000 UT) CAPS Take 5,000 Units by mouth daily.    . Cholecalciferol (VITAMIN D3) 2000 units capsule Take 1 capsule (2,000 Units total) by mouth daily. 90 capsule 0  . Dextromethorphan-guaiFENesin (CORICIDIN HBP CONGESTION/COUGH PO) Take 1 tablet by mouth every 4 (four) hours as needed (cold symptoms).    Marland Kitchen escitalopram (LEXAPRO) 20 MG tablet Take 1 tablet (20 mg total) by mouth at bedtime. 30 tablet 2  . furosemide (LASIX) 20 MG tablet Take 1 tablet (20 mg total) by mouth daily. 30 tablet 2  . lamoTRIgine (LAMICTAL) 200 MG tablet Take 200 mg by mouth 2 (two) times daily.    Marland Kitchen lamoTRIgine (LAMICTAL) 25 MG tablet Take 50 mg by mouth at bedtime.    Marland Kitchen losartan (COZAAR) 25 MG tablet Take 0.5 tablets (12.5 mg total) by mouth daily. 15 tablet 2  . Nutritional Supplements (ESTROVEN PO) Take 1  tablet by mouth daily.     Marland Kitchen omeprazole (PRILOSEC) 20 MG capsule Take 1 capsule (20 mg total) by mouth daily. 90 capsule 1  . Oxcarbazepine (TRILEPTAL) 300 MG tablet Take 1 tablet (300 mg total) by mouth 2 (two) times daily. 180 tablet 3  . Oxcarbazepine (TRILEPTAL) 300 MG tablet Take 300 mg by mouth 2 (two) times daily.    . pantoprazole (PROTONIX) 40 MG tablet Take 1 tablet (40 mg total) by mouth daily. 30 tablet 0  . polyethylene glycol powder (GLYCOLAX/MIRALAX) 17 GM/SCOOP powder Take 255 g by mouth daily. 255 g 3  . potassium chloride SA (KLOR-CON) 20 MEQ tablet Take 1 tablet (20 mEq total) by mouth daily. 30 tablet 0   No current facility-administered medications for this visit.    Allergies:   Zonisamide, Aspirin, Aspirin, Ciprofloxacin, Ciprofloxacin, Codeine, Hydrocodone, Oxycodone, Penicillins, and Penicillins   Social History:  The patient  reports that she has quit smoking. Her smoking use included cigarettes. She has a 22.50 pack-year smoking history. She has never used  smokeless tobacco. She reports that she does not use drugs.   Family History:  The patient's family history includes Alzheimer's disease in her mother; Colon cancer in her paternal aunt; Colon polyps in her brother, mother, sister, and sister; Coronary artery disease in her maternal grandfather; Diabetes in her sister; Hypertension in her brother, father, and mother; Kidney failure in her sister; Leukemia in her father; Parkinson's disease in her maternal grandmother; Prostate cancer in her father; Sleep apnea in her brother; Stroke in her brother and father.  ROS:  Please see the history of present illness.    All other systems are reviewed and otherwise negative.   PHYSICAL EXAM:  VS:  BP (!) 108/58   Pulse 60   Ht 5\' 2"  (1.575 m)   Wt 160 lb (72.6 kg)   LMP  (LMP Unknown)   SpO2 97%   BMI 29.26 kg/m  BMI: Body mass index is 29.26 kg/m. Well nourished, well developed, in no acute distress HEENT:  normocephalic, atraumatic Neck: no JVD, carotid bruits or masses Cardiac:  RRR; no significant murmurs, no rubs, or gallops Lungs:  CTA b/l, no wheezing, rhonchi or rales Abd: soft, nontender MS: no deformity or atrophy Ext:  no edema Skin: warm and dry, no rash Neuro:  No gross deficits appreciated Psych: euthymic mood, full affect  PPM site is stable, no tethering or discomfort   EKG:  Not done today  Device interrogation done today and reviewed by myself:  Battery and lead measurements are good AP 8%, total VP 6.6% She has no R waves today at 30   Transthoracic Echocardiogram: Date: 06/24/20 Results: 1. Left ventricular ejection fraction, by estimation, is 40 to 45%. The  left ventricle has mildly decreased function. The left ventricle  demonstrates global hypokinesis. There is mild concentric left ventricular  hypertrophy. Left ventricular diastolic  parameters are consistent with Grade II diastolic dysfunction  (pseudonormalization).  2. Right ventricular systolic function is normal. The right ventricular  size is normal. There is moderately elevated pulmonary artery systolic  pressure. The estimated right ventricular systolic pressure is 63.7 mmHg.  3. A small pericardial effusion is present.  4. The mitral valve is normal in structure. Trivial mitral valve  regurgitation.  5. Tricuspid valve regurgitation is mild to moderate.  6. The aortic valve is tricuspid. Aortic valve regurgitation is not  visualized. No aortic stenosis is present.  7. The inferior vena cava is dilated in size with >50% respiratory  variability, suggesting right atrial pressure of 8 mmHg.    12/14/2014: TTE Study Conclusions  - Left ventricle: The cavity size was normal. Wall thickness was  increased in a pattern of mild LVH. Systolic function was normal.  The estimated ejection fraction was in the range of 55% to 60%.  Wall motion was normal; there were no regional wall motion   abnormalities. Doppler parameters are consistent with abnormal  left ventricular relaxation (grade 1 diastolic dysfunction).  - Pericardium, extracardiac: A trivial pericardial effusion was  identified. Features were not consistent with tamponade  physiology.    10/23/2014: lexiscan stress test Low risk pharmacological stress nuclear study with LBBB-related fixed perfusion artifact, otherwise normal perfusion and normal left ventricular regional and global systolic function.  Recent Labs: 06/19/2020: ALT 18 06/22/2020: B Natriuretic Peptide 477.8 06/26/2020: Hemoglobin 13.1; Magnesium 2.2; Platelets 244 07/29/2020: BUN 9; Creatinine, Ser 1.26; Potassium 4.3; Sodium 140  06/25/2020: Triglycerides 304   Estimated Creatinine Clearance: 43.7 mL/min (A) (by C-G formula based on SCr  of 1.26 mg/dL (H)).   Wt Readings from Last 3 Encounters:  07/29/20 160 lb (72.6 kg)  07/08/20 160 lb 6.4 oz (72.8 kg)  06/26/20 163 lb 3.2 oz (74 kg)     Other studies reviewed: Additional studies/records reviewed today include: summarized above  ASSESSMENT AND PLAN:  1. PPM     Intact function, no programming changes made  2. NICM     New, suspect stress induced      Increase in VP% suspect 2/2 to the additioin of coreg for her CM      Plan echo in a coupe months to asses LV recovery      No symptoms or exam findings to suggest volume OL  3. HTN     No changes     Not much room for med titration    Disposition: F/u with remotes as usual, plan echo about April?may and OV after that, sooner if needed  Current medicines are reviewed at length with the patient today.  The patient did not have any concerns regarding medicines.  Venetia Night, PA-C 07/29/2020 5:02 PM     [place] [address] [phone] (office)  [phone] (fax)

## 2020-07-29 ENCOUNTER — Encounter: Payer: Self-pay | Admitting: Physician Assistant

## 2020-07-29 ENCOUNTER — Ambulatory Visit: Payer: Medicare HMO | Admitting: Physician Assistant

## 2020-07-29 ENCOUNTER — Other Ambulatory Visit: Payer: Self-pay

## 2020-07-29 VITALS — BP 108/58 | HR 60 | Ht 62.0 in | Wt 160.0 lb

## 2020-07-29 DIAGNOSIS — I428 Other cardiomyopathies: Secondary | ICD-10-CM | POA: Diagnosis not present

## 2020-07-29 DIAGNOSIS — I10 Essential (primary) hypertension: Secondary | ICD-10-CM

## 2020-07-29 DIAGNOSIS — Z79899 Other long term (current) drug therapy: Secondary | ICD-10-CM | POA: Diagnosis not present

## 2020-07-29 DIAGNOSIS — I442 Atrioventricular block, complete: Secondary | ICD-10-CM | POA: Diagnosis not present

## 2020-07-29 DIAGNOSIS — Z950 Presence of cardiac pacemaker: Secondary | ICD-10-CM

## 2020-07-29 DIAGNOSIS — I429 Cardiomyopathy, unspecified: Secondary | ICD-10-CM | POA: Diagnosis not present

## 2020-07-29 LAB — CUP PACEART INCLINIC DEVICE CHECK
Date Time Interrogation Session: 20220221162915
Implantable Lead Implant Date: 20160707
Implantable Lead Implant Date: 20160707
Implantable Lead Location: 753859
Implantable Lead Location: 753860
Implantable Lead Model: 5076
Implantable Lead Model: 5076
Implantable Pulse Generator Implant Date: 20160707
Lead Channel Pacing Threshold Amplitude: 0.5 V
Lead Channel Pacing Threshold Amplitude: 0.75 V
Lead Channel Sensing Intrinsic Amplitude: 2.5 mV

## 2020-07-29 LAB — BASIC METABOLIC PANEL
BUN/Creatinine Ratio: 7 — ABNORMAL LOW (ref 12–28)
BUN: 9 mg/dL (ref 8–27)
CO2: 23 mmol/L (ref 20–29)
Calcium: 9.4 mg/dL (ref 8.7–10.3)
Chloride: 101 mmol/L (ref 96–106)
Creatinine, Ser: 1.26 mg/dL — ABNORMAL HIGH (ref 0.57–1.00)
GFR calc Af Amer: 53 mL/min/{1.73_m2} — ABNORMAL LOW (ref >59)
GFR calc non Af Amer: 46 mL/min/{1.73_m2} — ABNORMAL LOW (ref >59)
Glucose: 83 mg/dL (ref 65–99)
Potassium: 4.3 mmol/L (ref 3.5–5.2)
Sodium: 140 mmol/L (ref 134–144)

## 2020-07-29 NOTE — Patient Instructions (Signed)
Medication Instructions:    Your physician recommends that you continue on your current medications as directed. Please refer to the Current Medication list given to you today.] *If you need a refill on your cardiac medications before your next appointment, please call your pharmacy*   Lab Work: BMET TODAY    If you have labs (blood work) drawn today and your tests are completely normal, you will receive your results only by: Marland Kitchen MyChart Message (if you have MyChart) OR . A paper copy in the mail If you have any lab test that is abnormal or we need to change your treatment, we will call you to review the results.   Testing/Procedures: April or May Your physician has requested that you have an echocardiogram. Echocardiography is a painless test that uses sound waves to create images of your heart. It provides your doctor with information about the size and shape of your heart and how well your heart's chambers and valves are working. This procedure takes approximately one hour. There are no restrictions for this procedure.  Follow-Up: At [facility], you and your health needs are our priority.  As part of our continuing mission to provide you with exceptional heart care, we have created designated Provider Care Teams.  These Care Teams include your primary Cardiologist (physician) and Advanced Practice Providers (APPs -  Physician Assistants and Nurse Practitioners) who all work together to provide you with the care you need, when you need it.  We recommend signing up for the patient portal called "MyChart".  Sign up information is provided on this After Visit Summary.  MyChart is used to connect with patients for Virtual Visits (Telemedicine).  Patients are able to view lab/test results, encounter notes, upcoming appointments, etc.  Non-urgent messages can be sent to your provider as well.   To learn more about what you can do with MyChart, go to [email or web address].    Your next  appointment:   3 month(s)  The format for your next appointment:   In Person  Provider:   You may see Dr. Caryl Comes  or one of the following Advanced Practice Providers on your designated Care Team:    Tommye Standard, [place]     Other Instructions

## 2020-07-30 ENCOUNTER — Telehealth: Payer: Self-pay | Admitting: Physician Assistant

## 2020-07-30 MED ORDER — POTASSIUM CHLORIDE CRYS ER 20 MEQ PO TBCR: 20.0000 meq | EXTENDED_RELEASE_TABLET | Freq: Every day | ORAL | 3 refills | Status: DC

## 2020-07-30 MED ORDER — CARVEDILOL 6.25 MG PO TABS
6.2500 mg | ORAL_TABLET | Freq: Two times a day (BID) | ORAL | 3 refills | Status: DC
Start: 2020-07-30 — End: 2020-10-21

## 2020-07-30 NOTE — Telephone Encounter (Signed)
*  STAT* If patient is at the pharmacy, call can be transferred to refill team.   1. Which medications need to be refilled? (please list name of each medication and dose if known)  New prescription for Carvedilol and Potassium Chloride  2. Which pharmacy/location (including street and city if local pharmacy) is medication to be sent to? [place] [facility] [place]  3. Do they need a 30 day or 90 day supply? 90 days and refiils

## 2020-07-30 NOTE — Telephone Encounter (Signed)
Pt's medication was sent to pt's pharmacy as requested. Confirmation received.  °

## 2020-08-12 ENCOUNTER — Ambulatory Visit: Payer: Medicare HMO | Admitting: Family Medicine

## 2020-08-14 DIAGNOSIS — L988 Other specified disorders of the skin and subcutaneous tissue: Secondary | ICD-10-CM | POA: Diagnosis not present

## 2020-08-14 DIAGNOSIS — Z85820 Personal history of malignant melanoma of skin: Secondary | ICD-10-CM | POA: Diagnosis not present

## 2020-08-14 DIAGNOSIS — C4359 Malignant melanoma of other part of trunk: Secondary | ICD-10-CM | POA: Diagnosis not present

## 2020-08-28 DIAGNOSIS — I442 Atrioventricular block, complete: Secondary | ICD-10-CM | POA: Diagnosis not present

## 2020-08-28 DIAGNOSIS — I429 Cardiomyopathy, unspecified: Secondary | ICD-10-CM | POA: Diagnosis not present

## 2020-08-28 DIAGNOSIS — F410 Panic disorder [episodic paroxysmal anxiety] without agoraphobia: Secondary | ICD-10-CM | POA: Diagnosis not present

## 2020-08-28 DIAGNOSIS — G40909 Epilepsy, unspecified, not intractable, without status epilepticus: Secondary | ICD-10-CM | POA: Diagnosis not present

## 2020-08-28 DIAGNOSIS — Z950 Presence of cardiac pacemaker: Secondary | ICD-10-CM | POA: Diagnosis not present

## 2020-08-28 DIAGNOSIS — Z4802 Encounter for removal of sutures: Secondary | ICD-10-CM | POA: Diagnosis not present

## 2020-08-28 DIAGNOSIS — C439 Malignant melanoma of skin, unspecified: Secondary | ICD-10-CM | POA: Diagnosis not present

## 2020-08-28 DIAGNOSIS — E782 Mixed hyperlipidemia: Secondary | ICD-10-CM | POA: Diagnosis not present

## 2020-09-18 ENCOUNTER — Other Ambulatory Visit: Payer: Self-pay

## 2020-09-18 ENCOUNTER — Other Ambulatory Visit: Payer: Self-pay | Admitting: Physician Assistant

## 2020-09-18 ENCOUNTER — Ambulatory Visit (HOSPITAL_COMMUNITY): Payer: Medicare HMO | Attending: Cardiology

## 2020-09-18 DIAGNOSIS — I428 Other cardiomyopathies: Secondary | ICD-10-CM

## 2020-09-18 LAB — ECHOCARDIOGRAM LIMITED
Area-P 1/2: 2.99 cm2
S' Lateral: 3 cm

## 2020-10-03 ENCOUNTER — Ambulatory Visit (INDEPENDENT_AMBULATORY_CARE_PROVIDER_SITE_OTHER): Payer: Medicare HMO

## 2020-10-03 DIAGNOSIS — I442 Atrioventricular block, complete: Secondary | ICD-10-CM

## 2020-10-04 DIAGNOSIS — R059 Cough, unspecified: Secondary | ICD-10-CM | POA: Diagnosis not present

## 2020-10-04 DIAGNOSIS — Z03818 Encounter for observation for suspected exposure to other biological agents ruled out: Secondary | ICD-10-CM | POA: Diagnosis not present

## 2020-10-04 LAB — CUP PACEART REMOTE DEVICE CHECK
Battery Remaining Longevity: 61 mo
Battery Voltage: 3 V
Brady Statistic AP VP Percent: 58.31 %
Brady Statistic AP VS Percent: 0 %
Brady Statistic AS VP Percent: 41.69 %
Brady Statistic AS VS Percent: 0 %
Brady Statistic RA Percent Paced: 58.3 %
Brady Statistic RV Percent Paced: 100 %
Date Time Interrogation Session: 20220429105502
Implantable Lead Implant Date: 20160707
Implantable Lead Implant Date: 20160707
Implantable Lead Location: 753859
Implantable Lead Location: 753860
Implantable Lead Model: 5076
Implantable Lead Model: 5076
Implantable Pulse Generator Implant Date: 20160707
Lead Channel Impedance Value: 361 Ohm
Lead Channel Impedance Value: 380 Ohm
Lead Channel Impedance Value: 703 Ohm
Lead Channel Impedance Value: 722 Ohm
Lead Channel Pacing Threshold Amplitude: 0.5 V
Lead Channel Pacing Threshold Amplitude: 0.5 V
Lead Channel Pacing Threshold Pulse Width: 0.4 ms
Lead Channel Pacing Threshold Pulse Width: 0.4 ms
Lead Channel Sensing Intrinsic Amplitude: 2.5 mV
Lead Channel Sensing Intrinsic Amplitude: 2.5 mV
Lead Channel Sensing Intrinsic Amplitude: 24.25 mV
Lead Channel Sensing Intrinsic Amplitude: 24.25 mV
Lead Channel Setting Pacing Amplitude: 1.5 V
Lead Channel Setting Pacing Amplitude: 2.5 V
Lead Channel Setting Pacing Pulse Width: 0.4 ms
Lead Channel Setting Sensing Sensitivity: 1.2 mV

## 2020-10-14 ENCOUNTER — Telehealth: Payer: Self-pay | Admitting: Neurology

## 2020-10-14 ENCOUNTER — Other Ambulatory Visit: Payer: Self-pay | Admitting: Emergency Medicine

## 2020-10-14 DIAGNOSIS — R569 Unspecified convulsions: Secondary | ICD-10-CM

## 2020-10-14 MED ORDER — LAMOTRIGINE 25 MG PO TABS: 50.0000 mg | ORAL_TABLET | Freq: Every day | ORAL | 3 refills | Status: DC

## 2020-10-14 MED ORDER — OXCARBAZEPINE 300 MG PO TABS: 300.0000 mg | ORAL_TABLET | Freq: Two times a day (BID) | ORAL | 3 refills | Status: DC

## 2020-10-14 NOTE — Telephone Encounter (Signed)
Pt is requesting a refill for lamoTRIgine (LAMICTAL) 25 MG tablet &  Oxcarbazepine (TRILEPTAL) 300 MG tablet.  Pharmacy: [place] [phone]

## 2020-10-20 NOTE — Progress Notes (Signed)
Cardiology Office Note Date:  10/20/2020  Patient ID:  [name] [name], [name] [name] 1960/06/21, MRN [id number] PCP:  Aretta Nip, MD  Electrophysiologist: Dr. Caryl Comes    Chief Complaint:  f/u echo, VP%  History of Present Illness: [name] [name] is a 59 y.o. female with history of LBBB, CHB w/PPM, seizure d/o (follows with neurology), OSA no CPAP, HTN.  She comes today to be seen for Dr. Caryl Comes, last seen by him Jan 2021, at that time had been very liberal with sodium intake (perhaps had been recommended by someone) and she was edematous, and instructed to reduce her Na intake.  Noted 1%VP w/MVP on.  She was admitted to [facility] 06/19/20 with respiratory failure suspect 2/2 aspiration, in the ER Found hypoxic at 60%.  Brought to ED where patient had seizure-like episode and then become unresponsive requiring intubation on mechanical ventilator.  Treated with Zosyn initially. Hydrated for AKI.  Extubated the next day. Cardiology was consulted for new reduction in LVEF to 40-45% Not felt to have been volume OL EF change suspect 2/2 stress vs RV pacing. Signed off 06/25/20 Discharged 06/26/20 Aspiration penuonia   I saw her 07/29/20 She is much better since her hospital stay No further seizures, states her neurologist feklt the seizures were lkely provoked by hypoxia. Breathing is good, no DOE, SOB, no symptoms of orthopnea or PND. No CP, palpitations Tolerating the medications without side effects Did not think BP had room to titrate meds Planned for an echo in a few months with f/u afterwards  Echo noted LVEF 55%, grade I DD  TODAY She is doing well. No CP, palpitations or cardiac awareness. No dizzy spells, near syncope or syncope. No SOB No symptoms of PND or orthopnea She has gained a few pounds, her PMD stopped her lasix late Feb/early March, she continues to take K+   Device information MDT dual chamber PPM, implanted 12/13/2014   Past Medical History:  Diagnosis Date  .  Arthritis   . Chronic insomnia 03/07/2015  . Diverticulitis   . GERD (gastroesophageal reflux disease)   . Gout   . Hiatal hernia   . LBBB (left bundle branch block)   . Pacemaker    Medtronic   . Panic disorder 09/07/2014  . Pneumonia    x 1 - Walking  . Seizures ([place])    Last Seizure in 2017 - controlled with meds   . Sleep apnea    does not use a CPAP  . Wrist tendonitis    R     Past Surgical History:  Procedure Laterality Date  . COLONOSCOPY  last 03/31/2016  . COLONOSCOPY WITH PROPOFOL N/A 04/04/2019   Procedure: COLONOSCOPY WITH PROPOFOL;  Surgeon: Mauri Pole, MD;  Location: [facility];  Service: Endoscopy;  Laterality: N/A;  . COLONOSCOPY WITH PROPOFOL N/A 06/15/2019   Procedure: COLONOSCOPY WITH PROPOFOL;  Surgeon: Rush Landmark Telford Nab., MD;  Location: [place];  Service: Gastroenterology;  Laterality: N/A;  . COLONOSCOPY WITH PROPOFOL N/A 02/28/2020   Procedure: COLONOSCOPY WITH PROPOFOL;  Surgeon: Rush Landmark Telford Nab., MD;  Location: [facility];  Service: Gastroenterology;  Laterality: N/A;  . ENDOROTOR  02/28/2020   Procedure: CXKGYJEHU;  Surgeon: Mansouraty, Telford Nab., MD;  Location: [facility];  Service: Gastroenterology;;  . ENDOSCOPIC MUCOSAL RESECTION N/A 06/15/2019   Procedure: ENDOSCOPIC MUCOSAL RESECTION;  Surgeon: Irving Copas., MD;  Location: [place];  Service: Gastroenterology;  Laterality: N/A;  . ENDOSCOPIC MUCOSAL RESECTION N/A 02/28/2020   Procedure: ENDOSCOPIC  MUCOSAL RESECTION;  Surgeon: Rush Landmark Telford Nab., MD;  Location: [facility];  Service: Gastroenterology;  Laterality: N/A;  . EP IMPLANTABLE DEVICE N/A 12/13/2014   Procedure: Pacemaker Implant;  Surgeon: Deboraha Sprang, MD;  Location: [place] [facility];  Service: Cardiovascular;  Laterality: N/A;  . HAND SURGERY    . HEMOSTASIS CLIP PLACEMENT  06/15/2019   Procedure: HEMOSTASIS CLIP PLACEMENT;  Surgeon: Irving Copas., MD;  Location: [place];   Service: Gastroenterology;;  . HEMOSTASIS CLIP PLACEMENT  02/28/2020   Procedure: HEMOSTASIS CLIP PLACEMENT;  Surgeon: Irving Copas., MD;  Location: [facility];  Service: Gastroenterology;;  . Laretta Alstrom CONTROL  02/28/2020   Procedure: HEMOSTASIS CONTROL;  Surgeon: Irving Copas., MD;  Location: [facility];  Service: Gastroenterology;;  . POLYPECTOMY    . POLYPECTOMY  04/04/2019   Procedure: POLYPECTOMY;  Surgeon: Mauri Pole, MD;  Location: [facility];  Service: Endoscopy;;  . POLYPECTOMY  06/15/2019   Procedure: POLYPECTOMY;  Surgeon: Irving Copas., MD;  Location: [place];  Service: Gastroenterology;;  . Lia Foyer LIFTING INJECTION  06/15/2019   Procedure: SUBMUCOSAL LIFTING INJECTION;  Surgeon: Irving Copas., MD;  Location: [place];  Service: Gastroenterology;;  . TUBAL LIGATION    . UPPER GI ENDOSCOPY    . WISDOM TOOTH EXTRACTION      Current Outpatient Medications  Medication Sig Dispense Refill  . acetaminophen (TYLENOL) 500 MG tablet Take 500-1,000 mg by mouth every 6 (six) hours as needed for moderate pain (pain).     Marland Kitchen albuterol (VENTOLIN HFA) 108 (90 Base) MCG/ACT inhaler Inhale 2 puffs into the lungs every 6 (six) hours as needed for wheezing or shortness of breath. 8 g 2  . atorvastatin (LIPITOR) 20 MG tablet Take 20 mg by mouth daily.    . carvedilol (COREG) 6.25 MG tablet Take 1 tablet (6.25 mg total) by mouth 2 (two) times daily with a meal. 180 tablet 3  . Cholecalciferol (VITAMIN D) 125 MCG (5000 UT) CAPS Take 5,000 Units by mouth daily.    . Cholecalciferol (VITAMIN D3) 2000 units capsule Take 1 capsule (2,000 Units total) by mouth daily. 90 capsule 0  . Dextromethorphan-guaiFENesin (CORICIDIN HBP CONGESTION/COUGH PO) Take 1 tablet by mouth every 4 (four) hours as needed (cold symptoms).    Marland Kitchen escitalopram (LEXAPRO) 20 MG tablet Take 1 tablet (20 mg total) by mouth at bedtime. 30 tablet 2  . furosemide (LASIX) 20  MG tablet Take 1 tablet (20 mg total) by mouth daily. 30 tablet 2  . lamoTRIgine (LAMICTAL) 200 MG tablet Take 200 mg by mouth 2 (two) times daily.    Marland Kitchen lamoTRIgine (LAMICTAL) 25 MG tablet Take 2 tablets (50 mg total) by mouth at bedtime. 180 tablet 3  . losartan (COZAAR) 25 MG tablet Take 0.5 tablets (12.5 mg total) by mouth daily. 15 tablet 2  . Nutritional Supplements (ESTROVEN PO) Take 1 tablet by mouth daily.     Marland Kitchen omeprazole (PRILOSEC) 20 MG capsule Take 1 capsule (20 mg total) by mouth daily. 90 capsule 1  . Oxcarbazepine (TRILEPTAL) 300 MG tablet Take 1 tablet (300 mg total) by mouth 2 (two) times daily. 180 tablet 3  . pantoprazole (PROTONIX) 40 MG tablet Take 1 tablet (40 mg total) by mouth daily. 30 tablet 0  . polyethylene glycol powder (GLYCOLAX/MIRALAX) 17 GM/SCOOP powder Take 255 g by mouth daily. 255 g 3  . potassium chloride SA (KLOR-CON) 20 MEQ tablet Take 1 tablet (20 mEq total) by mouth  daily. 90 tablet 3   No current facility-administered medications for this visit.    Allergies:   Zonisamide, Aspirin, Aspirin, Ciprofloxacin, Ciprofloxacin, Codeine, Hydrocodone, Oxycodone, Penicillins, and Penicillins   Social History:  The patient  reports that she has quit smoking. Her smoking use included cigarettes. She has a 22.50 pack-year smoking history. She has never used smokeless tobacco. She reports that she does not use drugs.   Family History:  The patient's family history includes Alzheimer's disease in her mother; Colon cancer in her paternal aunt; Colon polyps in her brother, mother, sister, and sister; Coronary artery disease in her maternal grandfather; Diabetes in her sister; Hypertension in her brother, father, and mother; Kidney failure in her sister; Leukemia in her father; Parkinson's disease in her maternal grandmother; Prostate cancer in her father; Sleep apnea in her brother; Stroke in her brother and father.  ROS:  Please see the history of present illness.    All  other systems are reviewed and otherwise negative.   PHYSICAL EXAM:  VS:  LMP  (LMP Unknown)  BMI: There is no height or weight on file to calculate BMI. Well nourished, well developed, in no acute distress HEENT: normocephalic, atraumatic Neck: no JVD, carotid bruits or masses Cardiac:  RRR; no significant murmurs, no rubs, or gallops Lungs:  CTA b/l, no wheezing, rhonchi or rales Abd: soft, nontender MS: no deformity or atrophy Ext:  no edema Skin: warm and dry, no rash Neuro:  No gross deficits appreciated Psych: euthymic mood, full affect  PPM site is stable, no tethering or discomfort   EKG:  Not done today  Device interrogation done today and reviewed by myself:  Battery and lead measurements are good AP 54% VP 100% No arrhythmias   09/18/20: TTE IMPRESSIONS  1. Left ventricular ejection fraction, by estimation, is 55%. The left  ventricle has no regional wall motion abnormalities. Left ventricular  diastolic parameters are consistent with Grade I diastolic dysfunction  (impaired relaxation).  2. Right ventricular systolic function is normal. The right ventricular  size is normal. Peak RV-RA gradient 20 mmHg.  3. Left atrial size was mildly dilated.  4. The mitral valve is normal in structure. No evidence of mitral valve  regurgitation. No evidence of mitral stenosis.  5. The IVC was not well-visualized.  6. Echo is limited.    Transthoracic Echocardiogram: Date: 06/24/20 Results: 1. Left ventricular ejection fraction, by estimation, is 40 to 45%. The  left ventricle has mildly decreased function. The left ventricle  demonstrates global hypokinesis. There is mild concentric left ventricular  hypertrophy. Left ventricular diastolic  parameters are consistent with Grade II diastolic dysfunction  (pseudonormalization).  2. Right ventricular systolic function is normal. The right ventricular  size is normal. There is moderately elevated pulmonary artery  systolic  pressure. The estimated right ventricular systolic pressure is 78.2 mmHg.  3. A small pericardial effusion is present.  4. The mitral valve is normal in structure. Trivial mitral valve  regurgitation.  5. Tricuspid valve regurgitation is mild to moderate.  6. The aortic valve is tricuspid. Aortic valve regurgitation is not  visualized. No aortic stenosis is present.  7. The inferior vena cava is dilated in size with >50% respiratory  variability, suggesting right atrial pressure of 8 mmHg.    12/14/2014: TTE Study Conclusions  - Left ventricle: The cavity size was normal. Wall thickness was  increased in a pattern of mild LVH. Systolic function was normal.  The estimated ejection fraction was in  the range of 55% to 60%.  Wall motion was normal; there were no regional wall motion  abnormalities. Doppler parameters are consistent with abnormal  left ventricular relaxation (grade 1 diastolic dysfunction).  - Pericardium, extracardiac: A trivial pericardial effusion was  identified. Features were not consistent with tamponade  physiology.    10/23/2014: lexiscan stress test Low risk pharmacological stress nuclear study with LBBB-related fixed perfusion artifact, otherwise normal perfusion and normal left ventricular regional and global systolic function.  Recent Labs: 06/19/2020: ALT 18 06/22/2020: B Natriuretic Peptide 477.8 06/26/2020: Hemoglobin 13.1; Magnesium 2.2; Platelets 244 07/29/2020: BUN 9; Creatinine, Ser 1.26; Potassium 4.3; Sodium 140  06/25/2020: Triglycerides 304   CrCl cannot be calculated (Patient's most recent lab result is older than the maximum 21 days allowed.).   Wt Readings from Last 3 Encounters:  07/29/20 160 lb (72.6 kg)  07/08/20 160 lb 6.4 oz (72.8 kg)  06/26/20 163 lb 3.2 oz (74 kg)     Other studies reviewed: Additional studies/records reviewed today include: summarized above  ASSESSMENT AND PLAN:  1. PPM     Intact  function, no programming changes made  2. NICM     New, suspect stress induced     LVEF has had nice recovery       With the addition/titration of her coreg her VP% and markedly increased fro 6.6% > 100% with MVP on Will reduce her coreg to 3.125mg  BID in effort to avoid RV pacing so much  Stop K+ given she has been off lasix No exam findings or symptoms of volume OL BMET today to check on her K+   3. HTN     As above    Disposition: remotes as usual, in clinic in 78mo, sooner if needed  Current medicines are reviewed at length with the patient today.  The patient did not have any concerns regarding medicines.  Venetia Night, PA-C 10/20/2020 5:49 PM     [place] [address] [address] [phone] (office)  [phone] (fax)

## 2020-10-21 ENCOUNTER — Encounter: Payer: Self-pay | Admitting: Physician Assistant

## 2020-10-21 ENCOUNTER — Other Ambulatory Visit: Payer: Self-pay

## 2020-10-21 ENCOUNTER — Ambulatory Visit: Payer: Medicare HMO | Admitting: Physician Assistant

## 2020-10-21 VITALS — BP 134/74 | HR 76 | Ht 61.5 in | Wt 173.6 lb

## 2020-10-21 DIAGNOSIS — Z79899 Other long term (current) drug therapy: Secondary | ICD-10-CM

## 2020-10-21 DIAGNOSIS — I10 Essential (primary) hypertension: Secondary | ICD-10-CM

## 2020-10-21 DIAGNOSIS — Z950 Presence of cardiac pacemaker: Secondary | ICD-10-CM | POA: Diagnosis not present

## 2020-10-21 DIAGNOSIS — I442 Atrioventricular block, complete: Secondary | ICD-10-CM | POA: Diagnosis not present

## 2020-10-21 DIAGNOSIS — I428 Other cardiomyopathies: Secondary | ICD-10-CM | POA: Diagnosis not present

## 2020-10-21 MED ORDER — CARVEDILOL 3.125 MG PO TABS: 3.1250 mg | ORAL_TABLET | Freq: Two times a day (BID) | ORAL | 3 refills | Status: DC

## 2020-10-21 NOTE — Patient Instructions (Signed)
Medication Instructions:  Your physician has recommended you make the following change in your medication:  1. STOP POTASSIUM  2. DECREASE COREG TO 3.125 MG TWICE DAILY  *If you need a refill on your cardiac medications before your next appointment, please call your pharmacy*   Lab Work: TODAY: BMET If you have labs (blood work) drawn today and your tests are completely normal, you will receive your results only by: Marland Kitchen MyChart Message (if you have MyChart) OR . A paper copy in the mail If you have any lab test that is abnormal or we need to change your treatment, we will call you to review the results.   Testing/Procedures: NONE   Follow-Up: At [facility], you and your health needs are our priority.  As part of our continuing mission to provide you with exceptional heart care, we have created designated Provider Care Teams.  These Care Teams include your primary Cardiologist (physician) and Advanced Practice Providers (APPs -  Physician Assistants and Nurse Practitioners) who all work together to provide you with the care you need, when you need it.  We recommend signing up for the patient portal called "MyChart".  Sign up information is provided on this After Visit Summary.  MyChart is used to connect with patients for Virtual Visits (Telemedicine).  Patients are able to view lab/test results, encounter notes, upcoming appointments, etc.  Non-urgent messages can be sent to your provider as well.   To learn more about what you can do with MyChart, go to [email or web address].    Your next appointment:   6 month(s)  The format for your next appointment:   In Person  Provider:   Tommye Standard, PA-C

## 2020-10-22 LAB — BASIC METABOLIC PANEL
BUN/Creatinine Ratio: 11 — ABNORMAL LOW (ref 12–28)
BUN: 13 mg/dL (ref 8–27)
CO2: 18 mmol/L — ABNORMAL LOW (ref 20–29)
Calcium: 9.4 mg/dL (ref 8.7–10.3)
Chloride: 101 mmol/L (ref 96–106)
Creatinine, Ser: 1.18 mg/dL — ABNORMAL HIGH (ref 0.57–1.00)
Glucose: 96 mg/dL (ref 65–99)
Potassium: 4.5 mmol/L (ref 3.5–5.2)
Sodium: 140 mmol/L (ref 134–144)
eGFR: 53 mL/min/{1.73_m2} — ABNORMAL LOW (ref >59)

## 2020-10-23 NOTE — Progress Notes (Signed)
Remote pacemaker transmission.   

## 2020-10-28 ENCOUNTER — Telehealth: Payer: Self-pay | Admitting: Neurology

## 2020-10-28 ENCOUNTER — Other Ambulatory Visit: Payer: Self-pay | Admitting: Emergency Medicine

## 2020-10-28 MED ORDER — LAMOTRIGINE 200 MG PO TABS: 200.0000 mg | ORAL_TABLET | Freq: Two times a day (BID) | ORAL | 3 refills | Status: DC

## 2020-10-28 NOTE — Telephone Encounter (Signed)
Pt request refill lamoTRIgine (LAMICTAL) 200 MG tablet at [place] #[id number]

## 2020-11-06 DIAGNOSIS — L821 Other seborrheic keratosis: Secondary | ICD-10-CM | POA: Diagnosis not present

## 2020-11-06 DIAGNOSIS — D225 Melanocytic nevi of trunk: Secondary | ICD-10-CM | POA: Diagnosis not present

## 2020-11-06 DIAGNOSIS — D485 Neoplasm of uncertain behavior of skin: Secondary | ICD-10-CM | POA: Diagnosis not present

## 2020-11-06 DIAGNOSIS — L814 Other melanin hyperpigmentation: Secondary | ICD-10-CM | POA: Diagnosis not present

## 2020-11-06 DIAGNOSIS — Z85820 Personal history of malignant melanoma of skin: Secondary | ICD-10-CM | POA: Diagnosis not present

## 2020-11-11 ENCOUNTER — Encounter: Payer: Self-pay | Admitting: Neurology

## 2020-11-11 ENCOUNTER — Ambulatory Visit: Payer: Medicare HMO | Admitting: Neurology

## 2020-11-11 VITALS — BP 153/69 | HR 61 | Ht 61.0 in | Wt 174.0 lb

## 2020-11-11 DIAGNOSIS — R569 Unspecified convulsions: Secondary | ICD-10-CM

## 2020-11-11 NOTE — Progress Notes (Signed)
PATIENT: [name] [name] DOB: [birth year]  REASON FOR VISIT: follow up HISTORY FROM: patient  HISTORY OF PRESENT ILLNESS: Today 11/11/20 [name] [name] is a 60 year old female with history of seizure disorder.  Last seizure was in January 2022, when she had significant illness requiring intubation with hypoxia and elevated blood glucose.  She is on Trileptal and Lamictal.  No further seizure events. Both medications were therapeutic at last office visit.  She is living with her sister.  Is currently not driving a car.  Has a pacemaker, sees cardiology. Claims has been doing well. At times feels dizzy, no falls.  Here today for evaluation unaccompanied.  HISTORY 07/08/2020 [name]: [name] is a 60 year old left-handed white female with a history of a seizure disorder.  The patient has been treated with Trileptal and lamotrigine, she has not had a seizure since December 2019.  Unfortunately, the patient developed a gradual onset of shortness of breath several days prior to a June 19, 2020 admission to the hospital.  The patient began having severe shortness of breath on the day of admission, EMS was called and the patient had a seizure prior to going in the hospital.  She was noted to have oxygen saturations in the 60% range at that time, her white blood count was 18.6 when she went in the hospital.  She was felt to have an aspiration pneumonia and required intubation.  During the hospitalization, she was extubated and then had another seizure following extubation and then had to be reintubated again.  The patient never had blood levels of the Trileptal or lamotrigine done on admission.  Her ammonia level is 147 with the upper range of normal being 35.  The patient has since been discharged from the hospital, she remains on her current dose of the lamotrigine taking 200 mg twice daily and 50 mg in the evening and she is on Trileptal 300 mg twice daily.  Higher levels of Trileptal in the  past have resulted in increased gait instability.  The patient has not had any seizures since coming out of the hospital.  She is not operating a motor vehicle.  She reports some gait instability and fatigue and drowsiness at times, she has not had any falls.  She has not had any further seizures coming out of the hospital.  She reports occasional migratory head pains consistent with "ice pick pains".  She denies any numbness or weakness of the extremities.  She has a pacemaker in place, she cannot have MRI.  A CT scan of the brain was done in the hospital and was unremarkable.   REVIEW OF SYSTEMS: Out of a complete 14 system review of symptoms, the patient complains only of the following symptoms, and all other reviewed systems are negative.  Seizures  ALLERGIES: Allergies  Allergen Reactions  . Zonisamide Anxiety    Panic Attacks  . Aspirin     sensitivity  . Aspirin     UNK reaction  . Ciprofloxacin     Seizures & it keeps her awake  . Ciprofloxacin     UNK reaction  . Codeine Hives  . Hydrocodone Hives  . Oxycodone Hives  . Penicillins Hives and Swelling    Did it involve swelling of the face/tongue/throat, SOB, or low BP? Yes Did it involve sudden or severe rash/hives, skin peeling, or any reaction on the inside of your mouth or nose? Yes Did you need to seek medical attention at a hospital or doctor's office?  No When did it last happen?1990's If all above answers are "NO", may proceed with cephalosporin use.   Marland Kitchen Penicillins Hives and Swelling    Pt tolerated ampicillin/sulbactam without issues 06/2020    HOME MEDICATIONS: Outpatient Medications Prior to Visit  Medication Sig Dispense Refill  . acetaminophen (TYLENOL) 500 MG tablet Take 500-1,000 mg by mouth every 6 (six) hours as needed for moderate pain (pain).     Marland Kitchen albuterol (VENTOLIN HFA) 108 (90 Base) MCG/ACT inhaler Inhale 2 puffs into the lungs every 6 (six) hours as needed for wheezing or shortness of breath. 8 g  2  . atorvastatin (LIPITOR) 20 MG tablet Take 20 mg by mouth daily.    . benzonatate (TESSALON) 100 MG capsule Take 100 mg by mouth daily.    . carvedilol (COREG) 3.125 MG tablet Take 1 tablet (3.125 mg total) by mouth 2 (two) times daily. 180 tablet 3  . Cholecalciferol (VITAMIN D3) 2000 units capsule Take 1 capsule (2,000 Units total) by mouth daily. 90 capsule 0  . Dextromethorphan-guaiFENesin (CORICIDIN HBP CONGESTION/COUGH PO) Take 1 tablet by mouth every 4 (four) hours as needed (cold symptoms).    Marland Kitchen DM-GG & DM-APAP-CPM (CORICIDIN HBP DAY/NIGHT COLD) 10-20 &15-200-2 MG MISC Take 1 capsule by mouth as needed (for cold).    Marland Kitchen escitalopram (LEXAPRO) 20 MG tablet Take 1 tablet by mouth daily.    Marland Kitchen lamoTRIgine (LAMICTAL) 200 MG tablet Take 1 tablet (200 mg total) by mouth 2 (two) times daily. 180 tablet 3  . lamoTRIgine (LAMICTAL) 25 MG tablet Take 2 tablets (50 mg total) by mouth at bedtime. 180 tablet 3  . losartan (COZAAR) 25 MG tablet Take 0.5 tablets (12.5 mg total) by mouth daily    . Oxcarbazepine (TRILEPTAL) 300 MG tablet Take 1 tablet (300 mg total) by mouth 2 (two) times daily. 180 tablet 3  . pantoprazole (PROTONIX) 40 MG tablet Take 1 tablet by mouth daily.     No facility-administered medications prior to visit.    PAST MEDICAL HISTORY: Past Medical History:  Diagnosis Date  . Arthritis   . Chronic insomnia 03/07/2015  . Diverticulitis   . GERD (gastroesophageal reflux disease)   . Gout   . Hiatal hernia   . LBBB (left bundle branch block)   . Pacemaker    [employer]   . Panic disorder 09/07/2014  . Pneumonia    x 1 - Walking  . Seizures ([place])    Last Seizure in 2017 - controlled with meds   . Sleep apnea    does not use a CPAP  . Wrist tendonitis    R     PAST SURGICAL HISTORY: Past Surgical History:  Procedure Laterality Date  . COLONOSCOPY  last 03/31/2016  . COLONOSCOPY WITH PROPOFOL N/A 04/04/2019   Procedure: COLONOSCOPY WITH PROPOFOL;  Surgeon: Mauri Pole, MD;  Location: [facility];  Service: Endoscopy;  Laterality: N/A;  . COLONOSCOPY WITH PROPOFOL N/A 06/15/2019   Procedure: COLONOSCOPY WITH PROPOFOL;  Surgeon: Rush Landmark Telford Nab., MD;  Location: [facility];  Service: Gastroenterology;  Laterality: N/A;  . COLONOSCOPY WITH PROPOFOL N/A 02/28/2020   Procedure: COLONOSCOPY WITH PROPOFOL;  Surgeon: Rush Landmark Telford Nab., MD;  Location: [facility];  Service: Gastroenterology;  Laterality: N/A;  . ENDOROTOR  02/28/2020   Procedure: CVELFYBOF;  Surgeon: Mansouraty, Telford Nab., MD;  Location: [facility];  Service: Gastroenterology;;  . ENDOSCOPIC MUCOSAL RESECTION N/A 06/15/2019   Procedure: ENDOSCOPIC MUCOSAL RESECTION;  Surgeon: Irving Copas., MD;  Location: [facility];  Service: Gastroenterology;  Laterality: N/A;  . ENDOSCOPIC MUCOSAL RESECTION N/A 02/28/2020   Procedure: ENDOSCOPIC MUCOSAL RESECTION;  Surgeon: Rush Landmark Telford Nab., MD;  Location: [facility];  Service: Gastroenterology;  Laterality: N/A;  . EP IMPLANTABLE DEVICE N/A 12/13/2014   Procedure: Pacemaker Implant;  Surgeon: Deboraha Sprang, MD;  Location: [facility];  Service: Cardiovascular;  Laterality: N/A;  . HAND SURGERY    . HEMOSTASIS CLIP PLACEMENT  06/15/2019   Procedure: HEMOSTASIS CLIP PLACEMENT;  Surgeon: Irving Copas., MD;  Location: [facility];  Service: Gastroenterology;;  . HEMOSTASIS CLIP PLACEMENT  02/28/2020   Procedure: HEMOSTASIS CLIP PLACEMENT;  Surgeon: Irving Copas., MD;  Location: [facility];  Service: Gastroenterology;;  . Laretta Alstrom CONTROL  02/28/2020   Procedure: HEMOSTASIS CONTROL;  Surgeon: Irving Copas., MD;  Location: [facility];  Service: Gastroenterology;;  . POLYPECTOMY    . POLYPECTOMY  04/04/2019   Procedure: POLYPECTOMY;  Surgeon: Mauri Pole, MD;  Location: [facility];  Service: Endoscopy;;  . POLYPECTOMY  06/15/2019   Procedure: POLYPECTOMY;  Surgeon: Irving Copas., MD;  Location: [facility];  Service: Gastroenterology;;  . Lia Foyer LIFTING INJECTION  06/15/2019   Procedure: SUBMUCOSAL LIFTING INJECTION;  Surgeon: Irving Copas., MD;  Location: [facility];  Service: Gastroenterology;;  . TUBAL LIGATION    . UPPER GI ENDOSCOPY    . WISDOM TOOTH EXTRACTION      FAMILY HISTORY: Family History  Problem Relation Age of Onset  . Alzheimer's disease Mother   . Hypertension Mother   . Colon polyps Mother   . Hypertension Father   . Stroke Father   . Leukemia Father   . Prostate cancer Father   . Colon polyps Sister   . Stroke Brother   . Hypertension Brother   . Sleep apnea Brother   . Colon polyps Brother   . Kidney failure Sister   . Diabetes Sister   . Colon polyps Sister   . Coronary artery disease Maternal Grandfather   . Parkinson's disease Maternal Grandmother   . Colon cancer Paternal Aunt   . Esophageal cancer Neg Hx   . Stomach cancer Neg Hx   . Rectal cancer Neg Hx   . Inflammatory bowel disease Neg Hx   . Liver disease Neg Hx   . Pancreatic cancer Neg Hx     SOCIAL HISTORY: Social History   Socioeconomic History  . Marital status: Single    Spouse name: Not on file  . Number of children: 0  . Years of education: 39  . Highest education level: Not on file  Occupational History  . Not on file  Tobacco Use  . Smoking status: Former Smoker    Packs/day: 0.50    Years: 45.00    Pack years: 22.50    Types: Cigarettes  . Smokeless tobacco: Never Used  Vaping Use  . Vaping Use: Never used  Substance and Sexual Activity  . Alcohol use: Not on file    Comment: Quit 12/06/2009  . Drug use: Never  . Sexual activity: Not on file  Other Topics Concern  . Not on file  Social History Narrative   Lives with sister   Left handed   Drinks 1-2 cups caffeine daily   Social Determinants of Health   Financial Resource Strain: Not on file  Food Insecurity: Not on file  Transportation Needs: Not on  file  Physical Activity: Not on file  Stress: Not on  file  Social Connections: Not on file  Intimate Partner Violence: Not on file   PHYSICAL EXAM  Vitals:   11/11/20 1259  BP: (!) 153/69  Pulse: 61  Weight: 174 lb (78.9 kg)  Height: 5\' 1"  (1.549 m)   Body mass index is 32.88 kg/m.  Generalized: Well developed, in no acute distress  Neurological examination  Mentation: Alert oriented to time, place, history taking. Follows all commands speech and language fluent Cranial nerve II-XII: Pupils were equal round reactive to light. Extraocular movements were full, visual field were full on confrontational test. Facial sensation and strength were normal. Head turning and shoulder shrug  were normal and symmetric. Motor: The motor testing reveals 5 over 5 strength of all 4 extremities. Good symmetric motor tone is noted throughout.  Sensory: Sensory testing is intact to soft touch on all 4 extremities. No evidence of extinction is noted.  Coordination: Cerebellar testing reveals good finger-nose-finger and heel-to-shin bilaterally.  Gait and station: Gait is normal. Tandem gait is slightly unsteady. Reflexes: Deep tendon reflexes are symmetric and normal bilaterally.   DIAGNOSTIC DATA (LABS, IMAGING, TESTING) - I reviewed patient records, labs, notes, testing and imaging myself where available.  Lab Results  Component Value Date   WBC 10.6 (H) 06/26/2020   HGB 13.1 06/26/2020   HCT 38.4 06/26/2020   MCV 89.3 06/26/2020   PLT 244 06/26/2020      Component Value Date/Time   NA 140 10/21/2020 1430   K 4.5 10/21/2020 1430   CL 101 10/21/2020 1430   CO2 18 (L) 10/21/2020 1430   GLUCOSE 96 10/21/2020 1430   GLUCOSE 91 06/26/2020 0532   BUN 13 10/21/2020 1430   CREATININE 1.18 (H) 10/21/2020 1430   CALCIUM 9.4 10/21/2020 1430   PROT 6.7 06/19/2020 0110   PROT 6.8 12/07/2018 1417   ALBUMIN 3.5 06/19/2020 0110   ALBUMIN 4.3 12/07/2018 1417   AST 28 06/19/2020 0110   ALT 18  06/19/2020 0110   ALKPHOS 153 (H) 06/19/2020 0110   BILITOT 0.1 (L) 06/19/2020 0110   BILITOT 0.3 12/07/2018 1417   GFRNONAA 46 (L) 07/29/2020 0902   GFRNONAA 48 (L) 06/26/2020 0532   GFRAA 53 (L) 07/29/2020 0902   Lab Results  Component Value Date   CHOL 257 (H) 03/02/2016   HDL 41.90 03/02/2016   LDLDIRECT 199.0 03/02/2016   TRIG 304 (H) 06/25/2020   CHOLHDL 6 03/02/2016   Lab Results  Component Value Date   HGBA1C 4.9 06/19/2020   Lab Results  Component Value Date   VITAMINB12 177 (L) 04/11/2015   Lab Results  Component Value Date   TSH 1.399 12/12/2014   ASSESSMENT AND PLAN 60 y.o. year old female  has a past medical history of Arthritis, Chronic insomnia (03/07/2015), Diverticulitis, GERD (gastroesophageal reflux disease), Gout, Hiatal hernia, LBBB (left bundle branch block), Pacemaker, Panic disorder (09/07/2014), Pneumonia, Seizures ([place]), Sleep apnea, and Wrist tendonitis. here with:  1.  History of seizures, most recent seizure January 2022 during significant illness with pneumonia requiring intubation  -No recurrent seizures -Continue Trileptal and Lamictal at current doses -Drug levels were therapeutic at last office visit in January 2022 -Her seizures in general have been brought on by hypoxia primarily -Recent BMP reviewed, stable creatinine 1.18, sodium level 140 -Not to drive until seizure-free for 6 months -Call for seizure activity, follow-up in 6 to 8 months or sooner if needed  Butler Denmark, AGNP-C, DNP 11/11/2020, 1:35 PM [facility] [address], [address]  [place], [place] [address] [phone]

## 2020-11-11 NOTE — Patient Instructions (Signed)
Continue current medications  No driving until seizure free for 6 months  Call for any seizure activity  See you back in 6-8 months

## 2020-11-11 NOTE — Progress Notes (Signed)
I have read the note, and I agree with the clinical assessment and plan.  Caileigh Canche K Leocadio Heal   

## 2020-12-10 DIAGNOSIS — D485 Neoplasm of uncertain behavior of skin: Secondary | ICD-10-CM | POA: Diagnosis not present

## 2020-12-10 DIAGNOSIS — Z85820 Personal history of malignant melanoma of skin: Secondary | ICD-10-CM | POA: Diagnosis not present

## 2020-12-10 DIAGNOSIS — D225 Melanocytic nevi of trunk: Secondary | ICD-10-CM | POA: Diagnosis not present

## 2021-01-02 ENCOUNTER — Ambulatory Visit (INDEPENDENT_AMBULATORY_CARE_PROVIDER_SITE_OTHER): Payer: Medicare HMO

## 2021-01-02 DIAGNOSIS — I442 Atrioventricular block, complete: Secondary | ICD-10-CM

## 2021-01-04 LAB — CUP PACEART REMOTE DEVICE CHECK
Battery Remaining Longevity: 56 mo
Battery Voltage: 2.99 V
Brady Statistic AP VP Percent: 38.15 %
Brady Statistic AP VS Percent: 0 %
Brady Statistic AS VP Percent: 61.85 %
Brady Statistic AS VS Percent: 0 %
Brady Statistic RA Percent Paced: 38.15 %
Brady Statistic RV Percent Paced: 100 %
Date Time Interrogation Session: 20220729103522
Implantable Lead Implant Date: 20160707
Implantable Lead Implant Date: 20160707
Implantable Lead Location: 753859
Implantable Lead Location: 753860
Implantable Lead Model: 5076
Implantable Lead Model: 5076
Implantable Pulse Generator Implant Date: 20160707
Lead Channel Impedance Value: 380 Ohm
Lead Channel Impedance Value: 380 Ohm
Lead Channel Impedance Value: 722 Ohm
Lead Channel Impedance Value: 741 Ohm
Lead Channel Pacing Threshold Amplitude: 0.5 V
Lead Channel Pacing Threshold Amplitude: 0.5 V
Lead Channel Pacing Threshold Pulse Width: 0.4 ms
Lead Channel Pacing Threshold Pulse Width: 0.4 ms
Lead Channel Sensing Intrinsic Amplitude: 2.875 mV
Lead Channel Sensing Intrinsic Amplitude: 2.875 mV
Lead Channel Sensing Intrinsic Amplitude: 24.25 mV
Lead Channel Sensing Intrinsic Amplitude: 24.25 mV
Lead Channel Setting Pacing Amplitude: 1.5 V
Lead Channel Setting Pacing Amplitude: 2.5 V
Lead Channel Setting Pacing Pulse Width: 0.4 ms
Lead Channel Setting Sensing Sensitivity: 1.2 mV

## 2021-01-28 NOTE — Progress Notes (Signed)
Remote pacemaker transmission.   

## 2021-02-06 DIAGNOSIS — L814 Other melanin hyperpigmentation: Secondary | ICD-10-CM | POA: Diagnosis not present

## 2021-02-06 DIAGNOSIS — L821 Other seborrheic keratosis: Secondary | ICD-10-CM | POA: Diagnosis not present

## 2021-02-06 DIAGNOSIS — D225 Melanocytic nevi of trunk: Secondary | ICD-10-CM | POA: Diagnosis not present

## 2021-02-06 DIAGNOSIS — D2361 Other benign neoplasm of skin of right upper limb, including shoulder: Secondary | ICD-10-CM | POA: Diagnosis not present

## 2021-02-06 DIAGNOSIS — D2372 Other benign neoplasm of skin of left lower limb, including hip: Secondary | ICD-10-CM | POA: Diagnosis not present

## 2021-02-06 DIAGNOSIS — Z85820 Personal history of malignant melanoma of skin: Secondary | ICD-10-CM | POA: Diagnosis not present

## 2021-03-04 DIAGNOSIS — Z23 Encounter for immunization: Secondary | ICD-10-CM | POA: Diagnosis not present

## 2021-03-04 DIAGNOSIS — R058 Other specified cough: Secondary | ICD-10-CM | POA: Diagnosis not present

## 2021-03-09 NOTE — Progress Notes (Signed)
Cardiology Office Note Date:  03/09/2021  Patient ID:  [name], [name] Jul 18, 1960, MRN [id number] PCP:  Aretta Nip, MD  Electrophysiologist: Dr. Caryl Comes    Chief Complaint:   6 mo, VP%  History of Present Illness: [name] [name] is a 60 y.o. female with history of LBBB, CHB w/PPM, seizure d/o (follows with neurology), OSA no CPAP, HTN.  She comes today to be seen for Dr. Caryl Comes, last seen by him Jan 2021, at that time had been very liberal with sodium intake (perhaps had been recommended by someone) and she was edematous, and instructed to reduce her Na intake.  Noted 1%VP w/MVP on.  She was admitted to [facility] 06/19/20 with respiratory failure suspect 2/2 aspiration, in the ER Found hypoxic at 60%.  Brought to ED where patient had seizure-like episode and then become unresponsive requiring intubation on mechanical ventilator.  Treated with Zosyn initially. Hydrated for AKI.  Extubated the next day. Cardiology was consulted for new reduction in LVEF to 40-45% Not felt to have been volume OL EF change suspect 2/2 stress vs RV pacing. Signed off 06/25/20 Discharged 06/26/20 Aspiration penuonia   I saw her 07/29/20 She is much better since her hospital stay No further seizures, states her neurologist feklt the seizures were lkely provoked by hypoxia. Breathing is good, no DOE, SOB, no symptoms of orthopnea or PND. No CP, palpitations Tolerating the medications without side effects Did not think BP had room to titrate meds Planned for an echo in a few months with f/u afterwards  Echo noted LVEF 55%, grade I DD  I saw her 10/21/20 She is doing well. No CP, palpitations or cardiac awareness. No dizzy spells, near syncope or syncope. No SOB No symptoms of PND or orthopnea She has gained a few pounds, her PMD stopped her lasix late Feb/early March, she continues to take K+  Device interrogation noted with addition and titration of coreg for her NICM resulted in 100% RV  pacing despite MVP mode and her coreg reduced to try and reduce RV pacing% K+ stopped given she was off lasix and planned for labs  TODAY She is doing OK, saw her PMD recently, discussed a dry, hacking/irritating cough her primary had her start an allergy medicine, this has not helped much She does have GERD and historically has been terrible Not particularly worse at night Not productive, no SOB  No CP, palpitations or cardiac awareness No dizzy spells, near syncope or syncope   No particular change in any way on lower coreg dose   Device information MDT dual chamber PPM, implanted 12/13/2014   Past Medical History:  Diagnosis Date   Arthritis    Chronic insomnia 03/07/2015   Diverticulitis    GERD (gastroesophageal reflux disease)    Gout    Hiatal hernia    LBBB (left bundle branch block)    Pacemaker    [employer]    Panic disorder 09/07/2014   Pneumonia    x 1 - Walking   Seizures ([place])    Last Seizure in 2017 - controlled with meds    Sleep apnea    does not use a CPAP   Wrist tendonitis    R     Past Surgical History:  Procedure Laterality Date   COLONOSCOPY  last 03/31/2016   COLONOSCOPY WITH PROPOFOL N/A 04/04/2019   Procedure: COLONOSCOPY WITH PROPOFOL;  Surgeon: Mauri Pole, MD;  Location: [facility];  Service: Endoscopy;  Laterality: N/A;  COLONOSCOPY WITH PROPOFOL N/A 06/15/2019   Procedure: COLONOSCOPY WITH PROPOFOL;  Surgeon: Rush Landmark Telford Nab., MD;  Location: [facility];  Service: Gastroenterology;  Laterality: N/A;   COLONOSCOPY WITH PROPOFOL N/A 02/28/2020   Procedure: COLONOSCOPY WITH PROPOFOL;  Surgeon: Rush Landmark Telford Nab., MD;  Location: [facility];  Service: Gastroenterology;  Laterality: N/A;   ENDOROTOR  02/28/2020   Procedure: [id number];  Surgeon: Mansouraty, Telford Nab., MD;  Location: [facility];  Service: Gastroenterology;;   ENDOSCOPIC MUCOSAL RESECTION N/A 06/15/2019   Procedure: ENDOSCOPIC MUCOSAL RESECTION;  Surgeon:  Irving Copas., MD;  Location: [facility];  Service: Gastroenterology;  Laterality: N/A;   ENDOSCOPIC MUCOSAL RESECTION N/A 02/28/2020   Procedure: ENDOSCOPIC MUCOSAL RESECTION;  Surgeon: Rush Landmark Telford Nab., MD;  Location: [facility];  Service: Gastroenterology;  Laterality: N/A;   EP IMPLANTABLE DEVICE N/A 12/13/2014   Procedure: Pacemaker Implant;  Surgeon: Deboraha Sprang, MD;  Location: [facility];  Service: Cardiovascular;  Laterality: N/A;   HAND SURGERY     HEMOSTASIS CLIP PLACEMENT  06/15/2019   Procedure: HEMOSTASIS CLIP PLACEMENT;  Surgeon: Irving Copas., MD;  Location: [place];  Service: Gastroenterology;;   HEMOSTASIS CLIP PLACEMENT  02/28/2020   Procedure: HEMOSTASIS CLIP PLACEMENT;  Surgeon: Irving Copas., MD;  Location: [facility];  Service: Gastroenterology;;   HEMOSTASIS CONTROL  02/28/2020   Procedure: HEMOSTASIS CONTROL;  Surgeon: Irving Copas., MD;  Location: [facility];  Service: Gastroenterology;;   POLYPECTOMY     POLYPECTOMY  04/04/2019   Procedure: POLYPECTOMY;  Surgeon: Mauri Pole, MD;  Location: [facility];  Service: Endoscopy;;   POLYPECTOMY  06/15/2019   Procedure: POLYPECTOMY;  Surgeon: Irving Copas., MD;  Location: [place];  Service: Gastroenterology;;   SUBMUCOSAL LIFTING INJECTION  06/15/2019   Procedure: SUBMUCOSAL LIFTING INJECTION;  Surgeon: Irving Copas., MD;  Location: [place];  Service: Gastroenterology;;   TUBAL LIGATION     UPPER GI ENDOSCOPY     WISDOM TOOTH EXTRACTION      Current Outpatient Medications  Medication Sig Dispense Refill   acetaminophen (TYLENOL) 500 MG tablet Take 500-1,000 mg by mouth every 6 (six) hours as needed for moderate pain (pain).      albuterol (VENTOLIN HFA) 108 (90 Base) MCG/ACT inhaler Inhale 2 puffs into the lungs every 6 (six) hours as needed for wheezing or shortness of breath. 8 g 2   atorvastatin (LIPITOR) 20 MG tablet  Take 20 mg by mouth daily.     benzonatate (TESSALON) 100 MG capsule Take 100 mg by mouth daily.     carvedilol (COREG) 3.125 MG tablet Take 1 tablet (3.125 mg total) by mouth 2 (two) times daily. 180 tablet 3   Cholecalciferol (VITAMIN D3) 2000 units capsule Take 1 capsule (2,000 Units total) by mouth daily. 90 capsule 0   Dextromethorphan-guaiFENesin (CORICIDIN HBP CONGESTION/COUGH PO) Take 1 tablet by mouth every 4 (four) hours as needed (cold symptoms).     DM-GG & DM-APAP-CPM (CORICIDIN HBP DAY/NIGHT COLD) 10-20 &15-200-2 MG MISC Take 1 capsule by mouth as needed (for cold).     escitalopram (LEXAPRO) 20 MG tablet Take 1 tablet by mouth daily.     lamoTRIgine (LAMICTAL) 200 MG tablet Take 1 tablet (200 mg total) by mouth 2 (two) times daily. 180 tablet 3   lamoTRIgine (LAMICTAL) 25 MG tablet Take 2 tablets (50 mg total) by mouth at bedtime. 180 tablet 3   losartan (COZAAR) 25 MG tablet Take 0.5 tablets (12.5 mg total)  by mouth daily     Oxcarbazepine (TRILEPTAL) 300 MG tablet Take 1 tablet (300 mg total) by mouth 2 (two) times daily. 180 tablet 3   pantoprazole (PROTONIX) 40 MG tablet Take 1 tablet by mouth daily.     No current facility-administered medications for this visit.    Allergies:   Zonisamide, Aspirin, Aspirin, Ciprofloxacin, Ciprofloxacin, Codeine, Hydrocodone, Oxycodone, Penicillins, and Penicillins   Social History:  The patient  reports that she has quit smoking. Her smoking use included cigarettes. She has a 22.50 pack-year smoking history. She has never used smokeless tobacco. She reports that she does not use drugs.   Family History:  The patient's family history includes Alzheimer's disease in her mother; Colon cancer in her paternal aunt; Colon polyps in her brother, mother, sister, and sister; Coronary artery disease in her maternal grandfather; Diabetes in her sister; Hypertension in her brother, father, and mother; Kidney failure in her sister; Leukemia in her father;  Parkinson's disease in her maternal grandmother; Prostate cancer in her father; Sleep apnea in her brother; Stroke in her brother and father.  ROS:  Please see the history of present illness.    All other systems are reviewed and otherwise negative.   PHYSICAL EXAM:  VS:  LMP  (LMP Unknown)  BMI: There is no height or weight on file to calculate BMI. Well nourished, well developed, in no acute distress HEENT: normocephalic, atraumatic Neck: no JVD, carotid bruits or masses Cardiac:  RRR; no significant murmurs, no rubs, or gallops Lungs:  CTA b/l, no wheezing, rhonchi or rales Abd: soft, nontender MS: no deformity or atrophy Ext:  no edema Skin: warm and dry, no rash Neuro:  No gross deficits appreciated Psych: euthymic mood, full affect  PPM site is stable, no tethering or discomfort   EKG:  Not done today  Device interrogation done today and reviewed by myself:  Battery and lead measurements are good 100% VP (programmed MVP) No arrhythmias   09/18/20: TTE IMPRESSIONS   1. Left ventricular ejection fraction, by estimation, is 55%. The left  ventricle has no regional wall motion abnormalities. Left ventricular  diastolic parameters are consistent with Grade I diastolic dysfunction  (impaired relaxation).   2. Right ventricular systolic function is normal. The right ventricular  size is normal. Peak RV-RA gradient 20 mmHg.   3. Left atrial size was mildly dilated.   4. The mitral valve is normal in structure. No evidence of mitral valve  regurgitation. No evidence of mitral stenosis.   5. The IVC was not well-visualized.   6. Echo is limited.    Transthoracic Echocardiogram: Date: 06/24/20 Results:  1. Left ventricular ejection fraction, by estimation, is 40 to 45%. The  left ventricle has mildly decreased function. The left ventricle  demonstrates global hypokinesis. There is mild concentric left ventricular  hypertrophy. Left ventricular diastolic  parameters are  consistent with Grade II diastolic dysfunction  (pseudonormalization).   2. Right ventricular systolic function is normal. The right ventricular  size is normal. There is moderately elevated pulmonary artery systolic  pressure. The estimated right ventricular systolic pressure is 26.8 mmHg.   3. A small pericardial effusion is present.   4. The mitral valve is normal in structure. Trivial mitral valve  regurgitation.   5. Tricuspid valve regurgitation is mild to moderate.   6. The aortic valve is tricuspid. Aortic valve regurgitation is not  visualized. No aortic stenosis is present.   7. The inferior vena cava is dilated in  size with >50% respiratory  variability, suggesting right atrial pressure of 8 mmHg.    12/14/2014: TTE Study Conclusions  - Left ventricle: The cavity size was normal. Wall thickness was    increased in a pattern of mild LVH. Systolic function was normal.    The estimated ejection fraction was in the range of 55% to 60%.    Wall motion was normal; there were no regional wall motion    abnormalities. Doppler parameters are consistent with abnormal    left ventricular relaxation (grade 1 diastolic dysfunction).  - Pericardium, extracardiac: A trivial pericardial effusion was    identified. Features were not consistent with tamponade    physiology.    10/23/2014: lexiscan stress test Low risk pharmacological stress nuclear study with LBBB-related fixed perfusion artifact, otherwise normal perfusion and normal left ventricular regional and global systolic function.  Recent Labs: 06/19/2020: ALT 18 06/22/2020: B Natriuretic Peptide 477.8 06/26/2020: Hemoglobin 13.1; Magnesium 2.2; Platelets 244 10/21/2020: BUN 13; Creatinine, Ser 1.18; Potassium 4.5; Sodium 140  06/25/2020: Triglycerides 304   CrCl cannot be calculated (Patient's most recent lab result is older than the maximum 21 days allowed.).   Wt Readings from Last 3 Encounters:  11/11/20 174 lb (78.9 kg)   10/21/20 173 lb 9.6 oz (78.7 kg)  07/29/20 160 lb (72.6 kg)     Other studies reviewed: Additional studies/records reviewed today include: summarized above  ASSESSMENT AND PLAN:  1. PPM     Intact function, no programming changes made  2. NICM     New, suspect stress induced     LVEF has had nice recovery       Unclear etiology At the time suspect stress induced vs RV pacing though historically had not VP much at all until then/started on coreg If we can avoid RV pacing I think it is worth it. We discussed and will wean her off the coreg If off BB she remains with high VP % will reprogram her device and resume it   3. HTN     A little high, not usually She is asked to monitor her BP with stopping coreg, let me know if remains elevated/is higher  4. Cough Sounds mostly a dry irritating cough, new ?GERD, she mentions historically nexium worked well, she will d/w her PMD lungs are clear, she does not look volume OL She has been on ARB for 57mo, cough is new   Disposition: I will see her back in a couple months plan as above  Current medicines are reviewed at length with the patient today.  The patient did not have any concerns regarding medicines.  Venetia Night, PA-C 03/09/2021 10:04 AM     [address]  [place] [address] [phone] (office)  [phone] (fax)

## 2021-03-11 ENCOUNTER — Encounter: Payer: Self-pay | Admitting: Physician Assistant

## 2021-03-11 ENCOUNTER — Other Ambulatory Visit: Payer: Self-pay

## 2021-03-11 ENCOUNTER — Ambulatory Visit: Payer: Medicare HMO | Admitting: Physician Assistant

## 2021-03-11 VITALS — BP 154/80 | HR 59 | Ht 61.0 in | Wt 180.8 lb

## 2021-03-11 DIAGNOSIS — I428 Other cardiomyopathies: Secondary | ICD-10-CM | POA: Diagnosis not present

## 2021-03-11 DIAGNOSIS — I10 Essential (primary) hypertension: Secondary | ICD-10-CM

## 2021-03-11 DIAGNOSIS — Z950 Presence of cardiac pacemaker: Secondary | ICD-10-CM | POA: Diagnosis not present

## 2021-03-11 NOTE — Patient Instructions (Addendum)
Medication Instructions:   Your physician recommends that you continue on your current medications as directed. Please refer to the Current Medication list given to you today.   *If you need a refill on your cardiac medications before your next appointment, please call your pharmacy*   Lab Work: NONE ORDERED  TODAY'  If you have labs (blood work) drawn today and your tests are completely normal, you will receive your results only by: [place] (if you have MyChart) OR A paper copy in the mail If you have any lab test that is abnormal or we need to change your treatment, we will call you to review the results.   Testing/Procedures: NONE ORDERED  TODAY    Follow-Up: At [facility], you and your health needs are our priority.  As part of our continuing mission to provide you with exceptional heart care, we have created designated Provider Care Teams.  These Care Teams include your primary Cardiologist (physician) and Advanced Practice Providers (APPs -  Physician Assistants and Nurse Practitioners) who all work together to provide you with the care you need, when you need it.  We recommend signing up for the patient portal called "MyChart".  Sign up information is provided on this After Visit Summary.  MyChart is used to connect with patients for Virtual Visits (Telemedicine).  Patients are able to view lab/test results, encounter notes, upcoming appointments, etc.  Non-urgent messages can be sent to your provider as well.   To learn more about what you can do with MyChart, go to [email or web address].    Your next appointment:   2-3 month(s)  The format for your next appointment:   In Person  Provider:   You will see one of the following Advanced Practice Providers on your designated Care Team:   Tommye Standard, [place]     Other Instructions

## 2021-03-21 DIAGNOSIS — M79651 Pain in right thigh: Secondary | ICD-10-CM | POA: Diagnosis not present

## 2021-04-03 ENCOUNTER — Ambulatory Visit (INDEPENDENT_AMBULATORY_CARE_PROVIDER_SITE_OTHER): Payer: Medicare HMO

## 2021-04-03 DIAGNOSIS — I442 Atrioventricular block, complete: Secondary | ICD-10-CM | POA: Diagnosis not present

## 2021-04-03 LAB — CUP PACEART REMOTE DEVICE CHECK
Battery Remaining Longevity: 54 mo
Battery Voltage: 2.99 V
Brady Statistic AP VP Percent: 9.72 %
Brady Statistic AP VS Percent: 0 %
Brady Statistic AS VP Percent: 90.26 %
Brady Statistic AS VS Percent: 0.01 %
Brady Statistic RA Percent Paced: 9.72 %
Brady Statistic RV Percent Paced: 99.99 %
Date Time Interrogation Session: 20221027090219
Implantable Lead Implant Date: 20160707
Implantable Lead Implant Date: 20160707
Implantable Lead Location: 753859
Implantable Lead Location: 753860
Implantable Lead Model: 5076
Implantable Lead Model: 5076
Implantable Pulse Generator Implant Date: 20160707
Lead Channel Impedance Value: 361 Ohm
Lead Channel Impedance Value: 380 Ohm
Lead Channel Impedance Value: 722 Ohm
Lead Channel Impedance Value: 741 Ohm
Lead Channel Pacing Threshold Amplitude: 0.5 V
Lead Channel Pacing Threshold Amplitude: 0.5 V
Lead Channel Pacing Threshold Pulse Width: 0.4 ms
Lead Channel Pacing Threshold Pulse Width: 0.4 ms
Lead Channel Sensing Intrinsic Amplitude: 2.375 mV
Lead Channel Sensing Intrinsic Amplitude: 2.375 mV
Lead Channel Sensing Intrinsic Amplitude: 23.875 mV
Lead Channel Sensing Intrinsic Amplitude: 23.875 mV
Lead Channel Setting Pacing Amplitude: 1.5 V
Lead Channel Setting Pacing Amplitude: 2.5 V
Lead Channel Setting Pacing Pulse Width: 0.4 ms
Lead Channel Setting Sensing Sensitivity: 1.2 mV

## 2021-04-10 NOTE — Progress Notes (Signed)
Remote pacemaker transmission.   

## 2021-04-15 DIAGNOSIS — R059 Cough, unspecified: Secondary | ICD-10-CM | POA: Diagnosis not present

## 2021-04-15 DIAGNOSIS — K219 Gastro-esophageal reflux disease without esophagitis: Secondary | ICD-10-CM | POA: Diagnosis not present

## 2021-04-15 DIAGNOSIS — H04123 Dry eye syndrome of bilateral lacrimal glands: Secondary | ICD-10-CM | POA: Diagnosis not present

## 2021-04-21 ENCOUNTER — Ambulatory Visit: Payer: Medicare HMO | Admitting: Physician Assistant

## 2021-04-22 DIAGNOSIS — K219 Gastro-esophageal reflux disease without esophagitis: Secondary | ICD-10-CM | POA: Diagnosis not present

## 2021-04-22 DIAGNOSIS — R058 Other specified cough: Secondary | ICD-10-CM | POA: Diagnosis not present

## 2021-04-25 ENCOUNTER — Other Ambulatory Visit: Payer: Self-pay | Admitting: Family Medicine

## 2021-04-25 ENCOUNTER — Ambulatory Visit
Admission: RE | Admit: 2021-04-25 | Discharge: 2021-04-25 | Disposition: A | Discharge status: Home / Self Care | Payer: Medicare HMO | Source: Ambulatory Visit | Attending: Family Medicine | Admitting: Family Medicine

## 2021-04-25 DIAGNOSIS — R053 Chronic cough: Secondary | ICD-10-CM

## 2021-04-25 DIAGNOSIS — R059 Cough, unspecified: Secondary | ICD-10-CM | POA: Diagnosis not present

## 2021-05-14 DIAGNOSIS — R053 Chronic cough: Secondary | ICD-10-CM | POA: Diagnosis not present

## 2021-05-14 DIAGNOSIS — I700 Atherosclerosis of aorta: Secondary | ICD-10-CM | POA: Diagnosis not present

## 2021-05-14 DIAGNOSIS — N1832 Chronic kidney disease, stage 3b: Secondary | ICD-10-CM | POA: Diagnosis not present

## 2021-05-14 DIAGNOSIS — I10 Essential (primary) hypertension: Secondary | ICD-10-CM | POA: Diagnosis not present

## 2021-05-18 NOTE — Progress Notes (Signed)
Cardiology Office Note Date:  05/18/2021  Patient ID:  [name] [name], [name] [name] [birth year], MRN [id number] PCP:  Aretta Nip, MD  Electrophysiologist: Dr. Caryl Comes    Chief Complaint:   6 mo, VP%  History of Present Illness: [name] [name] is a 60 y.o. female with history of LBBB, CHB w/PPM, seizure d/o (follows with neurology), OSA no CPAP, HTN.  She comes today to be seen for Dr. Caryl Comes, last seen by him Jan 2021, at that time had been very liberal with sodium intake (perhaps had been recommended by someone) and she was edematous, and instructed to reduce her Na intake.  Noted 1%VP w/MVP on.  She was admitted to [facility] 06/19/20 with respiratory failure suspect 2/2 aspiration, in the ER Found hypoxic at 60%.  Brought to ED where patient had seizure-like episode and then become unresponsive requiring intubation on mechanical ventilator.  Treated with Zosyn initially. Hydrated for AKI.  Extubated the next day. Cardiology was consulted for new reduction in LVEF to 40-45% Not felt to have been volume OL EF change suspect 2/2 stress vs RV pacing. Signed off 06/25/20 Discharged 06/26/20 Aspiration penuonia   I saw her 07/29/20 She is much better since her hospital stay No further seizures, states her neurologist feklt the seizures were lkely provoked by hypoxia. Breathing is good, no DOE, SOB, no symptoms of orthopnea or PND. No CP, palpitations Tolerating the medications without side effects Did not think BP had room to titrate meds Planned for an echo in a few months with f/u afterwards  Echo noted LVEF 55%, grade I DD  I saw her 10/21/20 She is doing well. No CP, palpitations or cardiac awareness. No dizzy spells, near syncope or syncope. No SOB No symptoms of PND or orthopnea She has gained a few pounds, her PMD stopped her lasix late Feb/early March, she continues to take K+  Device interrogation noted with addition and titration of coreg for her NICM resulted in 100% RV  pacing despite MVP mode and her coreg reduced to try and reduce RV pacing% K+ stopped given she was off lasix and planned for labs  I saw her 03/11/21 She is doing OK, saw her PMD recently, discussed a dry, hacking/irritating cough her primary had her start an allergy medicine, this has not helped much She does have GERD and historically has been terrible Not particularly worse at night Not productive, no SOB No CP, palpitations or cardiac awareness No dizzy spells, near syncope or syncope  No particular change in any way on lower coreg dose  Discussed unclear etiology of her CM, she was not RV pacing much at the time of the diagnosis though with addition of coreg was 100% RV pacing. Discussed that we would try and titrated her coreg down and wean off to avoid RV pacing, if she continued to have RV pacing despite being off coreg would plan to reprogram her device (turn of MVP) and resume her BB. She had a dry cough, ? ARB vs GERD  TODAY She is doing "OK" She saw her PMD for her cough, the losartan was stopped without significant improvement a couple weeks later started on montelukast and this seemed to help quite a bit but still gets into coughing fits that are pretty tough. No CP, palpitations or cardiac awareness No SOB, DOE No near syncope or syncope, but sometimes she coughs so hard she sees stars! She is also bothered by her RLS of late  She is off the coreg  for a month or so  Device information MDT dual chamber PPM, implanted 12/13/2014   Past Medical History:  Diagnosis Date   Arthritis    Chronic insomnia 03/07/2015   Diverticulitis    GERD (gastroesophageal reflux disease)    Gout    Hiatal hernia    LBBB (left bundle branch block)    Pacemaker    [employer]    Panic disorder 09/07/2014   Pneumonia    x 1 - Walking   Seizures ([place])    Last Seizure in 2017 - controlled with meds    Sleep apnea    does not use a CPAP   Wrist tendonitis    R     Past Surgical  History:  Procedure Laterality Date   COLONOSCOPY  last 03/31/2016   COLONOSCOPY WITH PROPOFOL N/A 04/04/2019   Procedure: COLONOSCOPY WITH PROPOFOL;  Surgeon: [name] Pole, MD;  Location: [facility];  Service: Endoscopy;  Laterality: N/A;   COLONOSCOPY WITH PROPOFOL N/A 06/15/2019   Procedure: COLONOSCOPY WITH PROPOFOL;  Surgeon: [name] Landmark Telford Nab., MD;  Location: [place];  Service: Gastroenterology;  Laterality: N/A;   COLONOSCOPY WITH PROPOFOL N/A 02/28/2020   Procedure: COLONOSCOPY WITH PROPOFOL;  Surgeon: [name] Landmark Telford Nab., MD;  Location: [facility];  Service: Gastroenterology;  Laterality: N/A;   ENDOROTOR  02/28/2020   Procedure: [id number];  Surgeon: Mansouraty, Telford Nab., MD;  Location: [facility];  Service: Gastroenterology;;   ENDOSCOPIC MUCOSAL RESECTION N/A 06/15/2019   Procedure: ENDOSCOPIC MUCOSAL RESECTION;  Surgeon: [name] Copas., MD;  Location: [place];  Service: Gastroenterology;  Laterality: N/A;   ENDOSCOPIC MUCOSAL RESECTION N/A 02/28/2020   Procedure: ENDOSCOPIC MUCOSAL RESECTION;  Surgeon: [name] Landmark Telford Nab., MD;  Location: [facility];  Service: Gastroenterology;  Laterality: N/A;   EP IMPLANTABLE DEVICE N/A 12/13/2014   Procedure: Pacemaker Implant;  Surgeon: [name] Sprang, MD;  Location: [place] [facility];  Service: Cardiovascular;  Laterality: N/A;   HAND SURGERY     HEMOSTASIS CLIP PLACEMENT  06/15/2019   Procedure: HEMOSTASIS CLIP PLACEMENT;  Surgeon: [name] Copas., MD;  Location: [place];  Service: Gastroenterology;;   HEMOSTASIS CLIP PLACEMENT  02/28/2020   Procedure: HEMOSTASIS CLIP PLACEMENT;  Surgeon: [name] Copas., MD;  Location: [facility];  Service: Gastroenterology;;   HEMOSTASIS CONTROL  02/28/2020   Procedure: HEMOSTASIS CONTROL;  Surgeon: [name] Copas., MD;  Location: [facility];  Service: Gastroenterology;;   POLYPECTOMY     POLYPECTOMY  04/04/2019   Procedure:  POLYPECTOMY;  Surgeon: [name] Pole, MD;  Location: [facility];  Service: Endoscopy;;   POLYPECTOMY  06/15/2019   Procedure: POLYPECTOMY;  Surgeon: [name] Copas., MD;  Location: [place];  Service: Gastroenterology;;   SUBMUCOSAL LIFTING INJECTION  06/15/2019   Procedure: SUBMUCOSAL LIFTING INJECTION;  Surgeon: [name] Copas., MD;  Location: [place];  Service: Gastroenterology;;   TUBAL LIGATION     UPPER GI ENDOSCOPY     WISDOM TOOTH EXTRACTION      Current Outpatient Medications  Medication Sig Dispense Refill   acetaminophen (TYLENOL) 500 MG tablet Take 500-1,000 mg by mouth every 6 (six) hours as needed for moderate pain (pain).      albuterol (VENTOLIN HFA) 108 (90 Base) MCG/ACT inhaler Inhale 2 puffs into the lungs every 6 (six) hours as needed for wheezing or shortness of breath. 8 g 2   atorvastatin (LIPITOR) 20 MG tablet Take 20 mg by mouth daily.     benzonatate (TESSALON) 100 MG capsule  Take 100 mg by mouth daily. (Patient not taking: Reported on 03/11/2021)     carvedilol (COREG) 3.125 MG tablet Take 1 tablet (3.125 mg total) by mouth 2 (two) times daily. 180 tablet 3   Cholecalciferol (VITAMIN D3) 2000 units capsule Take 1 capsule (2,000 Units total) by mouth daily. 90 capsule 0   Dextromethorphan-guaiFENesin (CORICIDIN HBP CONGESTION/COUGH PO) Take 1 tablet by mouth every 4 (four) hours as needed (cold symptoms).     DM-GG & DM-APAP-CPM (CORICIDIN HBP DAY/NIGHT COLD) 10-20 &15-200-2 MG MISC Take 1 capsule by mouth as needed (for cold).     escitalopram (LEXAPRO) 20 MG tablet Take 1 tablet by mouth daily.     lamoTRIgine (LAMICTAL) 200 MG tablet Take 1 tablet (200 mg total) by mouth 2 (two) times daily. 180 tablet 3   lamoTRIgine (LAMICTAL) 25 MG tablet Take 2 tablets (50 mg total) by mouth at bedtime. 180 tablet 3   losartan (COZAAR) 25 MG tablet Take 0.5 tablets (12.5 mg total) by mouth daily     Oxcarbazepine (TRILEPTAL) 300 MG tablet Take 1  tablet (300 mg total) by mouth 2 (two) times daily. 180 tablet 3   pantoprazole (PROTONIX) 40 MG tablet Take 1 tablet by mouth daily.     No current facility-administered medications for this visit.    Allergies:   Zonisamide, Aspirin, Aspirin, Ciprofloxacin, Ciprofloxacin, Codeine, Hydrocodone, Oxycodone, Penicillins, and Penicillins   Social History:  The patient  reports that she has quit smoking. Her smoking use included cigarettes. She has a 22.50 pack-year smoking history. She has never used smokeless tobacco. She reports that she does not use drugs.   Family History:  The patient's family history includes Alzheimer's disease in her mother; Colon cancer in her paternal aunt; Colon polyps in her brother, mother, sister, and sister; Coronary artery disease in her maternal grandfather; Diabetes in her sister; Hypertension in her brother, father, and mother; Kidney failure in her sister; Leukemia in her father; Parkinson's disease in her maternal grandmother; Prostate cancer in her father; Sleep apnea in her brother; Stroke in her brother and father.  ROS:  Please see the history of present illness.    All other systems are reviewed and otherwise negative.   PHYSICAL EXAM:  VS:  LMP  (LMP Unknown)  BMI: There is no height or weight on file to calculate BMI. Well nourished, well developed, in no acute distress HEENT: normocephalic, atraumatic Neck: no JVD, carotid bruits or masses Cardiac:  RRR; no significant murmurs, no rubs, or gallops Lungs:  CTA b/l, no wheezing, rhonchi or rales Abd: soft, nontender MS: no deformity or atrophy Ext:  no edema Skin: warm and dry, no rash Neuro:  No gross deficits appreciated Psych: euthymic mood, full affect  PPM site is stable, no tethering or discomfort  EKG done today and reviewed by myself SR/ Vpaced 70bpm  Device interrogation done today and reviewed by myself:  Battery and lead measurements are good She remains 100%VP No R waves toady  at 30bpm No arrhythmias   09/18/20: TTE IMPRESSIONS   1. Left ventricular ejection fraction, by estimation, is 55%. The left  ventricle has no regional wall motion abnormalities. Left ventricular  diastolic parameters are consistent with Grade I diastolic dysfunction  (impaired relaxation).   2. Right ventricular systolic function is normal. The right ventricular  size is normal. Peak RV-RA gradient 20 mmHg.   3. Left atrial size was mildly dilated.   4. The mitral valve is normal in structure.  No evidence of mitral valve  regurgitation. No evidence of mitral stenosis.   5. The IVC was not well-visualized.   6. Echo is limited.    Transthoracic Echocardiogram: Date: 06/24/20 Results:  1. Left ventricular ejection fraction, by estimation, is 40 to 45%. The  left ventricle has mildly decreased function. The left ventricle  demonstrates global hypokinesis. There is mild concentric left ventricular  hypertrophy. Left ventricular diastolic  parameters are consistent with Grade II diastolic dysfunction  (pseudonormalization).   2. Right ventricular systolic function is normal. The right ventricular  size is normal. There is moderately elevated pulmonary artery systolic  pressure. The estimated right ventricular systolic pressure is 03.0 mmHg.   3. A small pericardial effusion is present.   4. The mitral valve is normal in structure. Trivial mitral valve  regurgitation.   5. Tricuspid valve regurgitation is mild to moderate.   6. The aortic valve is tricuspid. Aortic valve regurgitation is not  visualized. No aortic stenosis is present.   7. The inferior vena cava is dilated in size with >50% respiratory  variability, suggesting right atrial pressure of 8 mmHg.    12/14/2014: TTE Study Conclusions  - Left ventricle: The cavity size was normal. Wall thickness was    increased in a pattern of mild LVH. Systolic function was normal.    The estimated ejection fraction was in the range  of 55% to 60%.    Wall motion was normal; there were no regional wall motion    abnormalities. Doppler parameters are consistent with abnormal    left ventricular relaxation (grade 1 diastolic dysfunction).  - Pericardium, extracardiac: A trivial pericardial effusion was    identified. Features were not consistent with tamponade    physiology.    10/23/2014: lexiscan stress test Low risk pharmacological stress nuclear study with LBBB-related fixed perfusion artifact, otherwise normal perfusion and normal left ventricular regional and global systolic function.  Recent Labs: 06/19/2020: ALT 18 06/22/2020: B Natriuretic Peptide 477.8 06/26/2020: Hemoglobin 13.1; Magnesium 2.2; Platelets 244 10/21/2020: BUN 13; Creatinine, Ser 1.18; Potassium 4.5; Sodium 140  06/25/2020: Triglycerides 304   CrCl cannot be calculated (Patient's most recent lab result is older than the maximum 21 days allowed.).   Wt Readings from Last 3 Encounters:  03/11/21 180 lb 12.8 oz (82 kg)  11/11/20 174 lb (78.9 kg)  10/21/20 173 lb 9.6 oz (78.7 kg)     Other studies reviewed: Additional studies/records reviewed today include: summarized above  ASSESSMENT AND PLAN:  1. PPM      Intact function  Remains device dependent despite OFF coreg I have programmed her from MVP > DDD   2. NICM     New, suspect stress induced     LVEF has had nice recovery     Resume coreg 3.125mg  BID  She will be seeing allergy/asthma specialist in consult She did not appreciate improvement in her cough off the ARB but I have asked her to ask them if we can put her back on it She will let us know  3. HTN     Looks good, really good home readings  4. Cough ?asthma, montelukast has improved it, not resolved She has GERD as well   Disposition: remotes as usual, in clinic in 51mo, sooner if needed   Current medicines are reviewed at length with the patient today.  The patient did not have any concerns regarding  medicines.  Venetia Night, PA-C 05/18/2021 10:27 AM     [facility]  [address] [place] [address] [phone] (office)  [phone] (fax)

## 2021-05-19 ENCOUNTER — Encounter: Payer: Self-pay | Admitting: Physician Assistant

## 2021-05-19 ENCOUNTER — Ambulatory Visit: Payer: Medicare HMO | Admitting: Physician Assistant

## 2021-05-19 ENCOUNTER — Other Ambulatory Visit: Payer: Self-pay

## 2021-05-19 VITALS — BP 140/80 | HR 70 | Ht 61.0 in | Wt 185.6 lb

## 2021-05-19 DIAGNOSIS — I10 Essential (primary) hypertension: Secondary | ICD-10-CM

## 2021-05-19 DIAGNOSIS — I442 Atrioventricular block, complete: Secondary | ICD-10-CM

## 2021-05-19 DIAGNOSIS — Z950 Presence of cardiac pacemaker: Secondary | ICD-10-CM

## 2021-05-19 DIAGNOSIS — D225 Melanocytic nevi of trunk: Secondary | ICD-10-CM | POA: Diagnosis not present

## 2021-05-19 DIAGNOSIS — R059 Cough, unspecified: Secondary | ICD-10-CM

## 2021-05-19 DIAGNOSIS — I428 Other cardiomyopathies: Secondary | ICD-10-CM | POA: Diagnosis not present

## 2021-05-19 DIAGNOSIS — L820 Inflamed seborrheic keratosis: Secondary | ICD-10-CM | POA: Diagnosis not present

## 2021-05-19 DIAGNOSIS — Z85820 Personal history of malignant melanoma of skin: Secondary | ICD-10-CM | POA: Diagnosis not present

## 2021-05-19 MED ORDER — CARVEDILOL 3.125 MG PO TABS: 3.1250 mg | ORAL_TABLET | Freq: Two times a day (BID) | ORAL | 3 refills | Status: DC

## 2021-05-19 NOTE — Patient Instructions (Signed)
Medication Instructions:    Your physician recommends that you continue on your current medications as directed. Please refer to the Current Medication list given to you today.  *If you need a refill on your cardiac medications before your next appointment, please call your pharmacy*   Lab Work: [place]   If you have labs (blood work) drawn today and your tests are completely normal, you will receive your results only by: [place] (if you have MyChart) OR A paper copy in the mail If you have any lab test that is abnormal or we need to change your treatment, we will call you to review the results.   Testing/Procedures: NONE ORDERED  TODAY    Follow-Up: At [facility], you and your health needs are our priority.  As part of our continuing mission to provide you with exceptional heart care, we have created designated Provider Care Teams.  These Care Teams include your primary Cardiologist (physician) and Advanced Practice Providers (APPs -  Physician Assistants and Nurse Practitioners) who all work together to provide you with the care you need, when you need it.  We recommend signing up for the patient portal called "MyChart".  Sign up information is provided on this After Visit Summary.  MyChart is used to connect with patients for Virtual Visits (Telemedicine).  Patients are able to view lab/test results, encounter notes, upcoming appointments, etc.  Non-urgent messages can be sent to your provider as well.   To learn more about what you can do with MyChart, go to [email or web address].    Your next appointment:   6 month(s)  The format for your next appointment:   In Person  Provider:   Virl Axe, MD   Other Instructions

## 2021-05-20 ENCOUNTER — Encounter: Payer: Self-pay | Admitting: Neurology

## 2021-05-20 ENCOUNTER — Ambulatory Visit: Payer: Medicare HMO | Admitting: Neurology

## 2021-05-20 VITALS — BP 123/66 | HR 69 | Ht 61.0 in | Wt 187.0 lb

## 2021-05-20 DIAGNOSIS — R569 Unspecified convulsions: Secondary | ICD-10-CM

## 2021-05-20 MED ORDER — LAMOTRIGINE 200 MG PO TABS: 200.0000 mg | ORAL_TABLET | Freq: Two times a day (BID) | ORAL | 3 refills | Status: DC

## 2021-05-20 MED ORDER — LAMOTRIGINE 25 MG PO TABS: 50.0000 mg | ORAL_TABLET | Freq: Every day | ORAL | 3 refills | Status: DC

## 2021-05-20 MED ORDER — OXCARBAZEPINE 300 MG PO TABS
300.0000 mg | ORAL_TABLET | Freq: Two times a day (BID) | ORAL | 3 refills | Status: DC
Start: 2021-05-20 — End: 2022-05-20

## 2021-05-20 NOTE — Progress Notes (Signed)
PATIENT: [name] [name] DOB: September 02, 1960  REASON FOR VISIT: follow up HISTORY FROM: patient Primary Neurologist: [name] retired will be followed by Dr. Leta Baptist   HISTORY OF PRESENT ILLNESS: Today 05/20/21 [name] [name] here today for follow-up of history of seizure disorder.  Remains on Trileptal and Lamictal.  Last seizure was in January 2022 when she was significantly ill. At last visit in Jan 2022 Lamictal level was 10.7, Trileptal was 17. No seizures of recent. Still lives with sister, doesn't drive a car. Other than lingering cough, no health issues. If she turns a corner, she can veer off, this is not new. There have been no falls. Here today alone. In the past the dosage of Trileptal has been reduced to help with gait stability without change, the dose was increased back. Has pacemaker.   Update 11/11/2020 SS: [name] [name] is a 60 year old female with history of seizure disorder.  Last seizure was in January 2022, when she had significant illness requiring intubation with hypoxia and elevated blood glucose.  She is on Trileptal and Lamictal.  No further seizure events. Both medications were therapeutic at last office visit.  She is living with her sister.  Is currently not driving a car.  Has a pacemaker, sees cardiology. Claims has been doing well. At times feels dizzy, no falls.  Here today for evaluation unaccompanied.  HISTORY 07/08/2020 [name]: [name] [name] is a 60 year old left-handed white female with a history of a seizure disorder.  The patient has been treated with Trileptal and lamotrigine, she has not had a seizure since December 2019.  Unfortunately, the patient developed a gradual onset of shortness of breath several days prior to a June 19, 2020 admission to the hospital.  The patient began having severe shortness of breath on the day of admission, EMS was called and the patient had a seizure prior to going in the hospital.  She was noted to have oxygen  saturations in the 60% range at that time, her white blood count was 18.6 when she went in the hospital.  She was felt to have an aspiration pneumonia and required intubation.  During the hospitalization, she was extubated and then had another seizure following extubation and then had to be reintubated again.  The patient never had blood levels of the Trileptal or lamotrigine done on admission.  Her ammonia level is 147 with the upper range of normal being 35.  The patient has since been discharged from the hospital, she remains on her current dose of the lamotrigine taking 200 mg twice daily and 50 mg in the evening and she is on Trileptal 300 mg twice daily.  Higher levels of Trileptal in the past have resulted in increased gait instability.  The patient has not had any seizures since coming out of the hospital.  She is not operating a motor vehicle.  She reports some gait instability and fatigue and drowsiness at times, she has not had any falls.  She has not had any further seizures coming out of the hospital.  She reports occasional migratory head pains consistent with "ice pick pains".  She denies any numbness or weakness of the extremities.  She has a pacemaker in place, she cannot have MRI.  A CT scan of the brain was done in the hospital and was unremarkable.   REVIEW OF SYSTEMS: Out of a complete 14 system review of symptoms, the patient complains only of the following symptoms, and all other reviewed systems are negative.  N/A  ALLERGIES: Allergies  Allergen Reactions   Zonisamide Anxiety    Panic Attacks   Aspirin     sensitivity   Aspirin     UNK reaction   Ciprofloxacin     Seizures & it keeps her awake   Ciprofloxacin     UNK reaction   Codeine Hives   Hydrocodone Hives   Oxycodone Hives   Penicillins Hives and Swelling    Did it involve swelling of the face/tongue/throat, SOB, or low BP? Yes Did it involve sudden or severe rash/hives, skin peeling, or any reaction on the  inside of your mouth or nose? Yes Did you need to seek medical attention at a hospital or doctor's office? No When did it last happen? 1990's   If all above answers are "NO", may proceed with cephalosporin use.    Penicillins Hives and Swelling    Pt tolerated ampicillin/sulbactam without issues 06/2020    HOME MEDICATIONS: Outpatient Medications Prior to Visit  Medication Sig Dispense Refill   acetaminophen (TYLENOL) 500 MG tablet Take 500-1,000 mg by mouth every 6 (six) hours as needed for moderate pain (pain).      albuterol (VENTOLIN HFA) 108 (90 Base) MCG/ACT inhaler Inhale 2 puffs into the lungs every 6 (six) hours as needed for wheezing or shortness of breath. 8 g 2   atorvastatin (LIPITOR) 20 MG tablet Take 20 mg by mouth daily.     benzonatate (TESSALON) 100 MG capsule Take 100 mg by mouth daily.     Cholecalciferol (VITAMIN D3) 2000 units capsule Take 1 capsule (2,000 Units total) by mouth daily. 90 capsule 0   Dextromethorphan-guaiFENesin (CORICIDIN HBP CONGESTION/COUGH PO) Take 1 tablet by mouth every 4 (four) hours as needed (cold symptoms).     DM-GG & DM-APAP-CPM (CORICIDIN HBP DAY/NIGHT COLD) 10-20 &15-200-2 MG MISC Take 1 capsule by mouth as needed (for cold).     escitalopram (LEXAPRO) 20 MG tablet Take 1 tablet by mouth daily.     ipratropium (ATROVENT) 0.06 % nasal spray as needed.     montelukast (SINGULAIR) 10 MG tablet Take 10 mg by mouth daily.     pantoprazole (PROTONIX) 40 MG tablet Take 1 tablet by mouth daily.     lamoTRIgine (LAMICTAL) 200 MG tablet Take 1 tablet (200 mg total) by mouth 2 (two) times daily. 180 tablet 3   lamoTRIgine (LAMICTAL) 25 MG tablet Take 2 tablets (50 mg total) by mouth at bedtime. 180 tablet 3   Oxcarbazepine (TRILEPTAL) 300 MG tablet Take 1 tablet (300 mg total) by mouth 2 (two) times daily. 180 tablet 3   carvedilol (COREG) 3.125 MG tablet Take 1 tablet (3.125 mg total) by mouth 2 (two) times daily. 180 tablet 3   losartan (COZAAR) 25  MG tablet Take 0.5 tablets (12.5 mg total) by mouth daily (Patient not taking: Reported on 05/19/2021)     No facility-administered medications prior to visit.    PAST MEDICAL HISTORY: Past Medical History:  Diagnosis Date   Arthritis    Chronic insomnia 03/07/2015   Diverticulitis    GERD (gastroesophageal reflux disease)    Gout    Hiatal hernia    LBBB (left bundle branch block)    Pacemaker    [employer]    Panic disorder 09/07/2014   Pneumonia    x 1 - Walking   Seizures ([place])    Last Seizure in 2017 - controlled with meds    Sleep apnea    does not use  a CPAP   Wrist tendonitis    R     PAST SURGICAL HISTORY: Past Surgical History:  Procedure Laterality Date   COLONOSCOPY  last 03/31/2016   COLONOSCOPY WITH PROPOFOL N/A 04/04/2019   Procedure: COLONOSCOPY WITH PROPOFOL;  Surgeon: Mauri Pole, MD;  Location: [facility];  Service: Endoscopy;  Laterality: N/A;   COLONOSCOPY WITH PROPOFOL N/A 06/15/2019   Procedure: COLONOSCOPY WITH PROPOFOL;  Surgeon: Rush Landmark Telford Nab., MD;  Location: [facility];  Service: Gastroenterology;  Laterality: N/A;   COLONOSCOPY WITH PROPOFOL N/A 02/28/2020   Procedure: COLONOSCOPY WITH PROPOFOL;  Surgeon: Rush Landmark Telford Nab., MD;  Location: [facility];  Service: Gastroenterology;  Laterality: N/A;   ENDOROTOR  02/28/2020   Procedure: [id number];  Surgeon: Mansouraty, Telford Nab., MD;  Location: [facility];  Service: Gastroenterology;;   ENDOSCOPIC MUCOSAL RESECTION N/A 06/15/2019   Procedure: ENDOSCOPIC MUCOSAL RESECTION;  Surgeon: Irving Copas., MD;  Location: [facility];  Service: Gastroenterology;  Laterality: N/A;   ENDOSCOPIC MUCOSAL RESECTION N/A 02/28/2020   Procedure: ENDOSCOPIC MUCOSAL RESECTION;  Surgeon: Rush Landmark Telford Nab., MD;  Location: [facility];  Service: Gastroenterology;  Laterality: N/A;   EP IMPLANTABLE DEVICE N/A 12/13/2014   Procedure: Pacemaker Implant;  Surgeon: Deboraha Sprang, MD;   Location: [facility];  Service: Cardiovascular;  Laterality: N/A;   HAND SURGERY     HEMOSTASIS CLIP PLACEMENT  06/15/2019   Procedure: HEMOSTASIS CLIP PLACEMENT;  Surgeon: Irving Copas., MD;  Location: [facility];  Service: Gastroenterology;;   HEMOSTASIS CLIP PLACEMENT  02/28/2020   Procedure: HEMOSTASIS CLIP PLACEMENT;  Surgeon: Irving Copas., MD;  Location: [facility];  Service: Gastroenterology;;   HEMOSTASIS CONTROL  02/28/2020   Procedure: HEMOSTASIS CONTROL;  Surgeon: Irving Copas., MD;  Location: [facility];  Service: Gastroenterology;;   POLYPECTOMY     POLYPECTOMY  04/04/2019   Procedure: POLYPECTOMY;  Surgeon: Mauri Pole, MD;  Location: [facility];  Service: Endoscopy;;   POLYPECTOMY  06/15/2019   Procedure: POLYPECTOMY;  Surgeon: Irving Copas., MD;  Location: [facility];  Service: Gastroenterology;;   SUBMUCOSAL LIFTING INJECTION  06/15/2019   Procedure: SUBMUCOSAL LIFTING INJECTION;  Surgeon: Irving Copas., MD;  Location: [facility];  Service: Gastroenterology;;   TUBAL LIGATION     UPPER GI ENDOSCOPY     WISDOM TOOTH EXTRACTION      FAMILY HISTORY: Family History  Problem Relation Age of Onset   Alzheimer's disease Mother    Hypertension Mother    Colon polyps Mother    Hypertension Father    Stroke Father    Leukemia Father    Prostate cancer Father    Colon polyps Sister    Stroke Brother    Hypertension Brother    Sleep apnea Brother    Colon polyps Brother    Kidney failure Sister    Diabetes Sister    Colon polyps Sister    Coronary artery disease Maternal Grandfather    Parkinson's disease Maternal Grandmother    Colon cancer Paternal Aunt    Esophageal cancer Neg Hx    Stomach cancer Neg Hx    Rectal cancer Neg Hx    Inflammatory bowel disease Neg Hx    Liver disease Neg Hx    Pancreatic cancer Neg Hx     SOCIAL HISTORY: Social History   Socioeconomic History   Marital  status: Single    Spouse name: Not on file   Number of children: 0   Years  of education: 14   Highest education level: Not on file  Occupational History   Not on file  Tobacco Use   Smoking status: Former    Packs/day: 0.50    Years: 45.00    Pack years: 22.50    Types: Cigarettes   Smokeless tobacco: Never  Vaping Use   Vaping Use: Never used  Substance and Sexual Activity   Alcohol use: Not on file    Comment: Quit 12/06/2009   Drug use: Never   Sexual activity: Not on file  Other Topics Concern   Not on file  Social History Narrative   Lives with sister   Left handed   Drinks 1-2 cups caffeine daily   Social Determinants of Health   Financial Resource Strain: Not on file  Food Insecurity: Not on file  Transportation Needs: Not on file  Physical Activity: Not on file  Stress: Not on file  Social Connections: Not on file  Intimate Partner Violence: Not on file   PHYSICAL EXAM  Vitals:   05/20/21 1304  BP: 123/66  Pulse: 69  Weight: 187 lb (84.8 kg)  Height: 5\' 1"  (1.549 m)    Body mass index is 35.33 kg/m.  Generalized: Well developed, in no acute distress  Neurological examination  Mentation: Alert oriented to time, place, history taking. Follows all commands speech and language fluent Cranial nerve II-XII: Pupils were equal round reactive to light. Extraocular movements were full, visual field were full on confrontational test. Facial sensation and strength were normal. Head turning and shoulder shrug  were normal and symmetric. Motor: The motor testing reveals 5 over 5 strength of all 4 extremities. Good symmetric motor tone is noted throughout.  Sensory: Sensory testing is intact to soft touch on all 4 extremities. No evidence of extinction is noted.  Coordination: Cerebellar testing reveals good finger-nose-finger and heel-to-shin bilaterally.  Gait and station: Gait is normal in hallway, is cautious with turns. Tandem gait is slightly unsteady. No  assistive devices.  Reflexes: Deep tendon reflexes are symmetric and normal bilaterally.   DIAGNOSTIC DATA (LABS, IMAGING, TESTING) - I reviewed patient records, labs, notes, testing and imaging myself where available.  Lab Results  Component Value Date   WBC 10.6 (H) 06/26/2020   HGB 13.1 06/26/2020   HCT 38.4 06/26/2020   MCV 89.3 06/26/2020   PLT 244 06/26/2020      Component Value Date/Time   NA 140 10/21/2020 1430   K 4.5 10/21/2020 1430   CL 101 10/21/2020 1430   CO2 18 (L) 10/21/2020 1430   GLUCOSE 96 10/21/2020 1430   GLUCOSE 91 06/26/2020 0532   BUN 13 10/21/2020 1430   CREATININE 1.18 (H) 10/21/2020 1430   CALCIUM 9.4 10/21/2020 1430   PROT 6.7 06/19/2020 0110   PROT 6.8 12/07/2018 1417   ALBUMIN 3.5 06/19/2020 0110   ALBUMIN 4.3 12/07/2018 1417   AST 28 06/19/2020 0110   ALT 18 06/19/2020 0110   ALKPHOS 153 (H) 06/19/2020 0110   BILITOT 0.1 (L) 06/19/2020 0110   BILITOT 0.3 12/07/2018 1417   GFRNONAA 46 (L) 07/29/2020 0902   GFRNONAA 48 (L) 06/26/2020 0532   GFRAA 53 (L) 07/29/2020 0902   Lab Results  Component Value Date   CHOL 257 (H) 03/02/2016   HDL 41.90 03/02/2016   LDLDIRECT 199.0 03/02/2016   TRIG 304 (H) 06/25/2020   CHOLHDL 6 03/02/2016   Lab Results  Component Value Date   HGBA1C 4.9 06/19/2020   Lab Results  Component Value Date   VITAMINB12 177 (L) 04/11/2015   Lab Results  Component Value Date   TSH 1.399 12/12/2014   ASSESSMENT AND PLAN 61 y.o. year old female  has a past medical history of Arthritis, Chronic insomnia (03/07/2015), Diverticulitis, GERD (gastroesophageal reflux disease), Gout, Hiatal hernia, LBBB (left bundle branch block), Pacemaker, Panic disorder (09/07/2014), Pneumonia, Seizures ([place]), Sleep apnea, and Wrist tendonitis. here with:  1.  History of seizures, most recent seizure January 2022 during significant illness with pneumonia requiring intubation  -No recent seizures, will continue current doses of Trileptal  and Lamictal -I will update routine labs, given report of long term gait instability  -Call for any seizure activity, otherwise we will follow-up in 1 year or sooner if needed -[name] has retired, patient will be followed by Dr. Leta Baptist if any new issues arise   Butler Denmark, AGNP-C, DNP 05/20/2021, 1:41 PM [facility] [address] [place], [place] [address] [phone]

## 2021-05-20 NOTE — Patient Instructions (Addendum)
Check labs today  Continue current medications Call for any seizure events  See you back in 1 year

## 2021-05-21 LAB — CBC WITH DIFFERENTIAL/PLATELET
Basophils Absolute: 0.1 10*3/uL (ref 0.0–0.2)
Basos: 1 %
EOS (ABSOLUTE): 0.2 10*3/uL (ref 0.0–0.4)
Eos: 2 %
Hematocrit: 39.3 % (ref 34.0–46.6)
Hemoglobin: 13.3 g/dL (ref 11.1–15.9)
Immature Grans (Abs): 0 10*3/uL (ref 0.0–0.1)
Immature Granulocytes: 0 %
Lymphocytes Absolute: 3.1 10*3/uL (ref 0.7–3.1)
Lymphs: 34 %
MCH: 31.2 pg (ref 26.6–33.0)
MCHC: 33.8 g/dL (ref 31.5–35.7)
MCV: 92 fL (ref 79–97)
Monocytes Absolute: 0.5 10*3/uL (ref 0.1–0.9)
Monocytes: 5 %
Neutrophils Absolute: 5.3 10*3/uL (ref 1.4–7.0)
Neutrophils: 58 %
Platelets: 239 10*3/uL (ref 150–450)
RBC: 4.26 x10E6/uL (ref 3.77–5.28)
RDW: 12.5 % (ref 11.7–15.4)
WBC: 9.1 10*3/uL (ref 3.4–10.8)

## 2021-05-21 LAB — COMPREHENSIVE METABOLIC PANEL
ALT: 12 IU/L (ref 0–32)
AST: 12 IU/L (ref 0–40)
Albumin/Globulin Ratio: 1.8 (ref 1.2–2.2)
Albumin: 4.4 g/dL (ref 3.8–4.8)
Alkaline Phosphatase: 172 IU/L — ABNORMAL HIGH (ref 44–121)
BUN/Creatinine Ratio: 7 — ABNORMAL LOW (ref 12–28)
BUN: 9 mg/dL (ref 8–27)
Bilirubin Total: 0.3 mg/dL (ref 0.0–1.2)
CO2: 22 mmol/L (ref 20–29)
Calcium: 9.1 mg/dL (ref 8.7–10.3)
Chloride: 105 mmol/L (ref 96–106)
Creatinine, Ser: 1.27 mg/dL — ABNORMAL HIGH (ref 0.57–1.00)
Globulin, Total: 2.5 g/dL (ref 1.5–4.5)
Glucose: 92 mg/dL (ref 70–99)
Potassium: 4.5 mmol/L (ref 3.5–5.2)
Sodium: 145 mmol/L — ABNORMAL HIGH (ref 134–144)
Total Protein: 6.9 g/dL (ref 6.0–8.5)
eGFR: 48 mL/min/{1.73_m2} — ABNORMAL LOW (ref >59)

## 2021-05-21 LAB — 10-HYDROXYCARBAZEPINE: Oxcarbazepine SerPl-Mcnc: 18 ug/mL (ref 10–35)

## 2021-05-21 LAB — LAMOTRIGINE LEVEL: Lamotrigine Lvl: 10.8 ug/mL (ref 2.0–20.0)

## 2021-06-24 NOTE — Progress Notes (Signed)
New Patient Note  RE: [name] [name] MRN: [id number] DOB: [birth year] Date of Office Visit: 06/25/2021  Consult requested by: Aretta Nip, MD Primary care provider: Aretta Nip, MD  Chief Complaint: Cough  History of Present Illness: I had the pleasure of seeing [name] [name] for initial evaluation at the [facility] on 06/25/2021. She is a 61 y.o. female, who is referred here by Rankins, Bill Salinas, MD for the evaluation of chronic cough.  She reports symptoms of dry coughing, wheezing sometimes and nocturnal awakenings for 1 year.   Current medications include albuterol prn which help. She reports not using aerochamber with inhalers. She tried the following inhalers: none. Main triggers are unknown. In the last month, frequency of symptoms: daily. Frequency of SABA use: 1-2 times per day. Sleep is disturbed. In the last 12 months, emergency room visits/urgent care visits/doctor office visits or hospitalizations due to respiratory issues: one. In the last 12 months, oral steroids courses: no. Lifetime history of hospitalization for respiratory issues: patient had pneumonia in January 2022. Prior intubations: no. History of pneumonia: one. She was not evaluated by allergist/pulmonologist in the past. Smoking exposure: started smoking in 1975. Up to date with flu vaccine: yes. Up to date with pneumonia vaccine: no. Up to date with COVID-19 vaccine: yes. Prior Covid-19 infection: no. History of reflux: takes pantoprazole daily with good benefit.  Some nasal congestion and rhinorrhea in the mornings. No prior allergy testing. Patient has tried Flonase and montelukast with unknown benefit.  04/25/2021 CXR: "Left-sided pacemaker unchanged. Lungs are adequately inflated and otherwise clear. Cardiomediastinal silhouette and remainder of the exam is unchanged.   IMPRESSION: No active cardiopulmonary disease."  Assessment and Plan: [name] [name] is a 61 y.o.  female with: Chronic cough Dry coughing, wheezing and nocturnal awakenings since she had pneumonia in January 2022.  No prior asthma/COPD diagnosis.  Currently using albuterol 1-2 times per day with some benefit.  Patient is a current smoker.  Takes pantoprazole for GERD. CXR no acute process in November 2022. Has pacemaker. Today's spirometry showed: severe restrictive disease with 11% improvement in FEV1 post bronchodilator treatment. Clinically feeling improved.  Today's skin testing showed: Positive to mold. Negative to common foods. The most common causes of chronic cough include the following: upper airway cough syndrome (UACS) which is caused by variety of rhinitis conditions; asthma; gastroesophageal reflux disease (GERD); chronic bronchitis from cigarette smoking or other inhaled environmental irritants; non-asthmatic eosinophilic bronchitis; and bronchiectasis.  In prospective studies, these conditions have accounted for up to 94% of the causes of chronic cough in immunocompetent adults.  Based on clinical history her coughing is most likely multifactorial.  Discussed smoking cessation.  Will do 2 month trial of daily maintenance inhaler due to prolonged symptoms.  Daily controller medication(s): start Symbicort 31mcg 2 puffs twice a day with spacer and rinse mouth afterwards. Spacer given and demonstrated proper use with inhaler. Patient understood technique and all questions/concerned were addressed.  May use albuterol rescue inhaler 2 puffs every 4 to 6 hours as needed for shortness of breath, chest tightness, coughing, and wheezing. May use albuterol rescue inhaler 2 puffs 5 to 15 minutes prior to strenuous physical activities. Monitor frequency of use.  Get spirometry at next visit.  Other allergic rhinitis Some rhinitis symptoms in the mornings. Tried Flonase and montelukast with unknown benefit. Today's skin testing showed: Positive to mold Start environmental control measures as  below. I doubt this is the cause of her  chronic cough.  Use Flonase (fluticasone) nasal spray 1 spray per nostril twice a day as needed for nasal congestion.  Nasal saline spray (i.e., Simply Saline) or nasal saline lavage (i.e., NeilMed) is recommended as needed and prior to medicated nasal sprays.  Heartburn See handout for lifestyle and dietary modifications. Continue pantoprazole 40 mg daily in the morning.  Nothing to eat or drink for 30 minutes afterwards.  Return in about 2 months (around 08/23/2021).  Meds ordered this encounter  Medications   budesonide-formoterol (SYMBICORT) 80-4.5 MCG/ACT inhaler    Sig: Inhale 2 puffs into the lungs in the morning and at bedtime. with spacer and rinse mouth afterwards.    Dispense:  1 each    Refill:  3   fluticasone (FLONASE) 50 MCG/ACT nasal spray    Sig: Place 1 spray into both nostrils 2 (two) times daily as needed (nasal congestion).    Dispense:  16 g    Refill:  5   Lab Orders  No laboratory test(s) ordered today    Other allergy screening: Food allergy: no Medication allergy: yes Hymenoptera allergy: no Urticaria: no Eczema:no History of recurrent infections suggestive of immunodeficency: no  Diagnostics: Spirometry:  Tracings reviewed. Her effort: Good reproducible efforts. FVC: 1.46L FEV1: 1.08L, 49% predicted FEV1/FVC ratio: 74% Interpretation: Spirometry consistent with severe restrictive disease with 11% improvement in FEV1 post bronchodilator treatment. Clinically feeling improved.   Please see scanned spirometry results for details.  Skin Testing: Environmental allergy panel and select foods. Positive to mold Negative to common foods. Results discussed with patient/family.  Airborne Adult Perc - 06/25/21 0923     Time Antigen Placed 9323    Allergen Manufacturer Lavella Hammock    Location Back    Number of Test 59    Panel 1 Select    1. Control-Buffer 50% Glycerol Negative    2. Control-Histamine 1 mg/ml 3+     3. Albumin saline Negative    4. [place] Negative    5. [place] Negative    6. Johnson Negative    7. Biddle Blue Negative    8. Meadow Fescue Negative    9. Perennial Rye Negative    10. Sweet Vernal Negative    11. Timothy Negative    12. Cocklebur Negative    13. Burweed Marshelder Negative    14. Ragweed, short Negative    15. Ragweed, Giant Negative    16. Plantain,  English Negative    17. Lamb's Quarters Negative    18. Sheep Sorrell Negative    19. Rough Pigweed Negative    20. Marsh Elder, Rough Negative    21. Mugwort, Common Negative    22. Ash mix Negative    23. Birch mix Negative    24. Beech American Negative    25. Box, Elder Negative    26. Cedar, red Negative    27. Cottonwood, [place] Negative    28. Elm mix Negative    29. Hickory Negative    30. Maple mix Negative    31. Oak, [place] mix Negative    32. Pecan Pollen Negative    33. Pine mix Negative    34. Sycamore Eastern Negative    35. Hi-Nella, Black Pollen Negative    36. Alternaria alternata Negative    37. Cladosporium Herbarum Negative    38. Aspergillus mix Negative    39. Penicillium mix Negative    40. Bipolaris sorokiniana (Helminthosporium) Negative    41. Drechslera spicifera (Curvularia) Negative  42. Mucor plumbeus 2+    43. Fusarium moniliforme Negative    44. Aureobasidium pullulans (pullulara) Negative    45. Rhizopus oryzae Negative    46. Botrytis cinera Negative    47. Epicoccum nigrum Negative    48. Phoma betae Negative    49. Candida Albicans Negative    50. Trichophyton mentagrophytes Negative    51. Mite, D Farinae  5,000 AU/ml Negative    52. Mite, D Pteronyssinus  5,000 AU/ml Negative    53. Cat Hair 10,000 BAU/ml Negative    54.  Dog Epithelia Negative    55. Mixed Feathers Negative    56. Horse Epithelia Negative    57. Cockroach, German Negative    58. Mouse Negative    59. Tobacco Leaf Negative             Food Perc - 06/25/21 0924       Test  Information   Time Antigen Placed 6301    Allergen Manufacturer Lavella Hammock    Location Back    Number of allergen test 10    Food Select      Food   1. Peanut Negative    2. Soybean food Negative    3. Wheat, whole Negative    4. Sesame Negative    5. Milk, cow Negative    6. Egg White, chicken Negative    7. Casein Negative    8. Shellfish mix Negative    9. Fish mix Negative    10. Cashew Negative             Intradermal - 06/25/21 0958     Time Antigen Placed 6010    Allergen Manufacturer Lavella Hammock    Location Arm    Number of Test 14    Intradermal Select    Control Negative    Guatem[place] Negative    Johnson Negative    7 Grass Negative    Ragweed mix Negative    Weed mix Negative    Tree mix Negative    Mold 1 Negative    Mold 2 Negative    Mold 4 Negative    Cat Negative    Dog Negative    Cockroach Negative    Mite mix Negative             Past Medical History: Patient Active Problem List   Diagnosis Date Noted   Chronic cough 06/25/2021   Other allergic rhinitis 06/25/2021   Heartburn 06/25/2021   Cigarette smoker 06/25/2021   CHB (complete heart block) (HCC)    HFrEF (heart failure with reduced ejection fraction) ([place])    Seizure ([place])    Acute respiratory failure ([place]) 06/19/2020   History of colonic polyps    Polyp of rectum    Routine general medical examination at a health care facility 03/02/2016   Obesity 03/02/2016   GERD (gastroesophageal reflux disease) 02/19/2016   Chronic insomnia 03/07/2015   Cardiac device in situ    Complete heart block (HCC)    LBBB (left bundle branch block) 10/11/2014   Tobacco abuse 10/11/2014   Panic disorder 09/07/2014   Spells 07/06/2014   Convulsions/seizures ([place]) 12/15/2013   Past Medical History:  Diagnosis Date   Arthritis    Chronic insomnia 03/07/2015   Diverticulitis    GERD (gastroesophageal reflux disease)    Gout    Hiatal hernia    LBBB (left bundle branch block)    Pacemaker     Medtronic  Panic disorder 09/07/2014   Pneumonia    x 1 - Walking   Seizures ([place])    Last Seizure in 2017 - controlled with meds    Sleep apnea    does not use a CPAP   Wrist tendonitis    R    Past Surgical History: Past Surgical History:  Procedure Laterality Date   COLONOSCOPY  last 03/31/2016   COLONOSCOPY WITH PROPOFOL N/A 04/04/2019   Procedure: COLONOSCOPY WITH PROPOFOL;  Surgeon: Mauri Pole, MD;  Location: [facility];  Service: Endoscopy;  Laterality: N/A;   COLONOSCOPY WITH PROPOFOL N/A 06/15/2019   Procedure: COLONOSCOPY WITH PROPOFOL;  Surgeon: Rush Landmark Telford Nab., MD;  Location: [place];  Service: Gastroenterology;  Laterality: N/A;   COLONOSCOPY WITH PROPOFOL N/A 02/28/2020   Procedure: COLONOSCOPY WITH PROPOFOL;  Surgeon: Rush Landmark Telford Nab., MD;  [facility]: [facility];  Service: Gastroenterology;  Laterality: N/A;   ENDOROTOR  02/28/2020   Procedure: [id number];  Surgeon: Mansouraty, Telford Nab., MD;  Location: [facility];  Service: Gastroenterology;;   ENDOSCOPIC MUCOSAL RESECTION N/A 06/15/2019   Procedure: ENDOSCOPIC MUCOSAL RESECTION;  Surgeon: Irving Copas., MD;  Location: [place];  Service: Gastroenterology;  Laterality: N/A;   ENDOSCOPIC MUCOSAL RESECTION N/A 02/28/2020   Procedure: ENDOSCOPIC MUCOSAL RESECTION;  Surgeon: Rush Landmark Telford Nab., MD;  [facility];  Service: Gastroenterology;  Laterality: N/A;   EP IMPLANTABLE DEVICE N/A 12/13/2014   Procedure: Pacemaker Implant;  Surgeon: Deboraha Sprang, MD;  Location: [facility];  Service: Cardiovascular;  Laterality: N/A;   HAND SURGERY     HEMOSTASIS CLIP PLACEMENT  06/15/2019   Procedure: HEMOSTASIS CLIP PLACEMENT;  Surgeon: Irving Copas., MD;  Location: [place];  Service: Gastroenterology;;   HEMOSTASIS CLIP PLACEMENT  02/28/2020   Procedure: HEMOSTASIS CLIP PLACEMENT;  Surgeon: Irving Copas., MD;  Location: [facility];  Service:  Gastroenterology;;   HEMOSTASIS CONTROL  02/28/2020   Procedure: HEMOSTASIS CONTROL;  Surgeon: Irving Copas., MD;  Location[facility];  Service: Gastroenterology;;   POLYPECTOMY     POLYPECTOMY  04/04/2019   Procedure: POLYPECTOMY;  Surgeon: Mauri Pole, MD;  [facility];  Service: Endoscopy;;   POLYPECTOMY  06/15/2019   Procedure: POLYPECTOMY;  Surgeon: Irving Copas., MD;  Location: [place];  Service: Gastroenterology;;   SUBMUCOSAL LIFTING INJECTION  06/15/2019   Procedure: SUBMUCOSAL LIFTING INJECTION;  Surgeon: Irving Copas., MD;  Location: [place];  Service: Gastroenterology;;   TUBAL LIGATION     UPPER GI ENDOSCOPY     WISDOM TOOTH EXTRACTION     Medication List:  Current Outpatient Medications  Medication Sig Dispense Refill   acetaminophen (TYLENOL) 500 MG tablet Take 500-1,000 mg by mouth every 6 (six) hours as needed for moderate pain (pain).      albuterol (VENTOLIN HFA) 108 (90 Base) MCG/ACT inhaler Inhale 2 puffs into the lungs every 6 (six) hours as needed for wheezing or shortness of breath. 8 g 2   atorvastatin (LIPITOR) 20 MG tablet Take 20 mg by mouth daily.     budesonide-formoterol (SYMBICORT) 80-4.5 MCG/ACT inhaler Inhale 2 puffs into the lungs in the morning and at bedtime. with spacer and rinse mouth afterwards. 1 each 3   Cholecalciferol (VITAMIN D3) 2000 units capsule Take 1 capsule (2,000 Units total) by mouth daily. 90 capsule 0   escitalopram (LEXAPRO) 20 MG tablet Take 1 tablet by mouth daily.     fluticasone (FLONASE) 50 MCG/ACT nasal spray Place 1  spray into both nostrils 2 (two) times daily as needed (nasal congestion). 16 g 5   lamoTRIgine (LAMICTAL) 200 MG tablet Take 1 tablet (200 mg total) by mouth 2 (two) times daily. 180 tablet 3   lamoTRIgine (LAMICTAL) 25 MG tablet Take 2 tablets (50 mg total) by mouth at bedtime. 180 tablet 3   montelukast (SINGULAIR) 10 MG tablet Take 10 mg by mouth daily.      Oxcarbazepine (TRILEPTAL) 300 MG tablet Take 1 tablet (300 mg total) by mouth 2 (two) times daily. 180 tablet 3   pantoprazole (PROTONIX) 40 MG tablet Take 1 tablet by mouth daily.     No current facility-administered medications for this visit.   Allergies: Allergies  Allergen Reactions   Zonisamide Anxiety    Panic Attacks   Aspirin     sensitivity   Aspirin     UNK reaction   Ciprofloxacin     Seizures & it keeps her awake   Ciprofloxacin     UNK reaction   Codeine Hives   Hydrocodone Hives   Oxycodone Hives   Penicillins Hives and Swelling    Did it involve swelling of the face/tongue/throat, SOB, or low BP? Yes Did it involve sudden or severe rash/hives, skin peeling, or any reaction on the inside of your mouth or nose? Yes Did you need to seek medical attention at a hospital or doctor's office? No When did it last happen? 1990's   If all above answers are NO, may proceed with cephalosporin use.    Penicillins Hives and Swelling    Pt tolerated ampicillin/sulbactam without issues 06/2020   Social History: Social History   Socioeconomic History   Marital status: Single    Spouse name: Not on file   Number of children: 0   Years of education: 14   Highest education level: Not on file  Occupational History   Not on file  Tobacco Use   Smoking status: Some Days    Packs/day: 0.50    Years: 45.00    Pack years: 22.50    Types: Cigarettes    Passive exposure: Current   Smokeless tobacco: Never  Vaping Use   Vaping Use: Never used  Substance and Sexual Activity   Alcohol use: Not on file    Comment: Quit 12/06/2009   Drug use: Never   Sexual activity: Not on file  Other Topics Concern   Not on file  Social History Narrative   Lives with sister   Left handed   Drinks 1-2 cups caffeine daily   Social Determinants of Health   Financial Resource Strain: Not on file  Food Insecurity: Not on file  Transportation Needs: Not on file  Physical Activity:  Not on file  Stress: Not on file  Social Connections: Not on file   Lives in a 61 year old house. Smoking: occasional smoker - started smoking in 1975 Occupation: not employed  Environmental HistoryFreight forwarder in the house: no Charity fundraiser in the family room: yes Carpet in the bedroom: yes Heating: gas Cooling: central Pet: yes 1 cat  x 13 yrs  Family History: Family History  Problem Relation Age of Onset   Allergic rhinitis Mother    Alzheimer's disease Mother    Hypertension Mother    Colon polyps Mother    Allergic rhinitis Father    Hypertension Father    Stroke Father    Leukemia Father    Prostate cancer Father    Allergic rhinitis Sister  Colon polyps Sister    Allergic rhinitis Sister    Kidney failure Sister    Diabetes Sister    Colon polyps Sister    Allergic rhinitis Brother    Stroke Brother    Hypertension Brother    Sleep apnea Brother    Colon polyps Brother    Colon cancer Paternal Aunt    Parkinson's disease Maternal Grandmother    Coronary artery disease Maternal Grandfather    Esophageal cancer Neg Hx    Stomach cancer Neg Hx    Rectal cancer Neg Hx    Inflammatory bowel disease Neg Hx    Liver disease Neg Hx    Pancreatic cancer Neg Hx    Review of Systems  Constitutional:  Negative for appetite change, chills, fever and unexpected weight change.  HENT:  Positive for congestion and rhinorrhea.   Eyes:  Negative for itching.  Respiratory:  Positive for cough and wheezing. Negative for chest tightness and shortness of breath.   Cardiovascular:  Negative for chest pain.  Gastrointestinal:  Negative for abdominal pain.  Genitourinary:  Negative for difficulty urinating.  Skin:  Negative for rash.  Allergic/Immunologic: Positive for environmental allergies. Negative for food allergies.  Neurological:  Negative for headaches.   Objective: BP (!) 146/72 (BP Location: Right Arm, Patient Position: Sitting, Cuff Size: Normal)    Pulse 64     Temp 97.9 F (36.6 C) (Temporal)    Resp 18    Ht 5\' 1"  (1.549 m)    Wt 189 lb (85.7 kg)    LMP  (LMP Unknown)    SpO2 97%    BMI 35.71 kg/m  Body mass index is 35.71 kg/m. Physical Exam Vitals and nursing note reviewed.  Constitutional:      Appearance: Normal appearance. She is well-developed. She is obese.  HENT:     Head: Normocephalic and atraumatic.     Right Ear: Tympanic membrane and external ear normal.     Left Ear: Tympanic membrane and external ear normal.     Nose: Nose normal.     Mouth/Throat:     Mouth: Mucous membranes are moist.     Pharynx: Oropharynx is clear.  Eyes:     Conjunctiva/sclera: Conjunctivae normal.  Cardiovascular:     Rate and Rhythm: Normal rate and regular rhythm.     Heart sounds: Normal heart sounds. No murmur heard.   No friction rub. No gallop.  Pulmonary:     Effort: Pulmonary effort is normal.     Breath sounds: No wheezing, rhonchi or rales.     Comments: Decreased breath sounds b/l. Musculoskeletal:     Cervical back: Neck supple.  Skin:    General: Skin is warm.     Findings: No rash.  Neurological:     Mental Status: She is alert and oriented to person, place, and time.  Psychiatric:        Behavior: Behavior normal.  The plan was reviewed with the patient/family, and all questions/concerned were addressed.  It was my pleasure to see [name] [name] today and participate in her care. Please feel free to contact me with any questions or concerns.  Sincerely,  Rexene Alberts, DO Allergy & Immunology  [facility] office: [place] office: [phone]

## 2021-06-25 ENCOUNTER — Encounter: Payer: Self-pay | Admitting: Allergy

## 2021-06-25 ENCOUNTER — Ambulatory Visit: Payer: Medicare HMO | Admitting: Allergy

## 2021-06-25 ENCOUNTER — Other Ambulatory Visit: Payer: Self-pay

## 2021-06-25 VITALS — BP 146/72 | HR 64 | Temp 97.9°F | Resp 18 | Ht 61.0 in | Wt 189.0 lb

## 2021-06-25 DIAGNOSIS — R053 Chronic cough: Secondary | ICD-10-CM | POA: Diagnosis not present

## 2021-06-25 DIAGNOSIS — F17210 Nicotine dependence, cigarettes, uncomplicated: Secondary | ICD-10-CM

## 2021-06-25 DIAGNOSIS — J45998 Other asthma: Secondary | ICD-10-CM | POA: Diagnosis not present

## 2021-06-25 DIAGNOSIS — R12 Heartburn: Secondary | ICD-10-CM | POA: Insufficient documentation

## 2021-06-25 DIAGNOSIS — J3089 Other allergic rhinitis: Secondary | ICD-10-CM

## 2021-06-25 MED ORDER — FLUTICASONE PROPIONATE 50 MCG/ACT NA SUSP: 1.0000 | Freq: Two times a day (BID) | NASAL | 5 refills | Status: DC | PRN

## 2021-06-25 MED ORDER — BUDESONIDE-FORMOTEROL FUMARATE 80-4.5 MCG/ACT IN AERO: 2.0000 | INHALATION_SPRAY | Freq: Two times a day (BID) | RESPIRATORY_TRACT | 3 refills | Status: DC

## 2021-06-25 NOTE — Patient Instructions (Addendum)
Today's skin testing showed: Positive to mold Negative to common foods.  Results given.  Coughing:  Decrease smoking.   Daily controller medication(s): start Symbicort 25mcg 2 puffs twice a day with spacer and rinse mouth afterwards. Spacer given and demonstrated proper use with inhaler. Patient understood technique and all questions/concerned were addressed.  May use albuterol rescue inhaler 2 puffs every 4 to 6 hours as needed for shortness of breath, chest tightness, coughing, and wheezing. May use albuterol rescue inhaler 2 puffs 5 to 15 minutes prior to strenuous physical activities. Monitor frequency of use.  Coughing control goals:  Full participation in all desired activities (may need albuterol before activity) Albuterol use two times or less a week on average (not counting use with activity) Cough interfering with sleep two times or less a month Oral steroids no more than once a year No hospitalizations   Environmental allergies Start environmental control measures as below. Use Flonase (fluticasone) nasal spray 1 spray per nostril twice a day as needed for nasal congestion.  Nasal saline spray (i.e., Simply Saline) or nasal saline lavage (i.e., NeilMed) is recommended as needed and prior to medicated nasal sprays.  Heartburn: See handout for lifestyle and dietary modifications. Continue pantoprazole 40 mg daily in the morning.  Nothing to eat or drink for 30 minutes afterwards.  Follow up in 2 months or sooner if needed.    Mold Control Mold and fungi can grow on a variety of surfaces provided certain temperature and moisture conditions exist.  Outdoor molds grow on plants, decaying vegetation and soil. The major outdoor mold, Alternaria and Cladosporium, are found in very high numbers during hot and dry conditions. Generally, a late summer - fall peak is seen for common outdoor fungal spores. Rain will temporarily lower outdoor mold spore count, but counts rise rapidly  when the rainy period ends. The most important indoor molds are Aspergillus and Penicillium. Dark, humid and poorly ventilated basements are ideal sites for mold growth. The next most common sites of mold growth are the bathroom and the kitchen. Outdoor (Seasonal) Mold Control Use air conditioning and keep windows closed. Avoid exposure to decaying vegetation. Avoid leaf raking. Avoid grain handling. Consider wearing a face mask if working in moldy areas.  Indoor (Perennial) Mold Control  Maintain humidity below 50%. Get rid of mold growth on hard surfaces with water, detergent and, if necessary, 5% bleach (do not mix with other cleaners). Then dry the area completely. If mold covers an area more than 10 square feet, consider hiring an indoor environmental professional. For clothing, washing with soap and water is best. If moldy items cannot be cleaned and dried, throw them away. Remove sources e.g. contaminated carpets. Repair and seal leaking roofs or pipes. Using dehumidifiers in damp basements may be helpful, but empty the water and clean units regularly to prevent mildew from forming. All rooms, especially basements, bathrooms and kitchens, require ventilation and cleaning to deter mold and mildew growth. Avoid carpeting on concrete or damp floors, and storing items in damp areas.

## 2021-06-25 NOTE — Assessment & Plan Note (Signed)
Some rhinitis symptoms in the mornings. Tried Flonase and montelukast with unknown benefit.  Today's skin testing showed: Positive to mold  Start environmental control measures as below.  I doubt this is the cause of her chronic cough.   Use Flonase (fluticasone) nasal spray 1 spray per nostril twice a day as needed for nasal congestion.   Nasal saline spray (i.e., Simply Saline) or nasal saline lavage (i.e., NeilMed) is recommended as needed and prior to medicated nasal sprays.

## 2021-06-25 NOTE — Assessment & Plan Note (Addendum)
Dry coughing, wheezing and nocturnal awakenings since she had pneumonia in January 2022.  No prior asthma/COPD diagnosis.  Currently using albuterol 1-2 times per day with some benefit.  Patient is a current smoker.  Takes pantoprazole for GERD. CXR no acute process in November 2022. Has pacemaker.  Today's spirometry showed: severe restrictive disease with 11% improvement in FEV1 post bronchodilator treatment. Clinically feeling improved.   Today's skin testing showed: Positive to mold. Negative to common foods. The most common causes of chronic cough include the following: upper airway cough syndrome (UACS) which is caused by variety of rhinitis conditions; asthma; gastroesophageal reflux disease (GERD); chronic bronchitis from cigarette smoking or other inhaled environmental irritants; non-asthmatic eosinophilic bronchitis; and bronchiectasis.  In prospective studies, these conditions have accounted for up to 94% of the causes of chronic cough in immunocompetent adults.  Based on clinical history her coughing is most likely multifactorial.  Discussed smoking cessation.  Will do 2 month trial of daily maintenance inhaler due to prolonged symptoms.   Daily controller medication(s): start Symbicort 50mcg 2 puffs twice a day with spacer and rinse mouth afterwards.  Spacer given and demonstrated proper use with inhaler. Patient understood technique and all questions/concerned were addressed.   May use albuterol rescue inhaler 2 puffs every 4 to 6 hours as needed for shortness of breath, chest tightness, coughing, and wheezing. May use albuterol rescue inhaler 2 puffs 5 to 15 minutes prior to strenuous physical activities. Monitor frequency of use.   Get spirometry at next visit.

## 2021-06-25 NOTE — Assessment & Plan Note (Signed)
·   See handout for lifestyle and dietary modifications.  Continue pantoprazole 40 mg daily in the morning.  Nothing to eat or drink for 30 minutes afterwards.

## 2021-07-03 ENCOUNTER — Ambulatory Visit (INDEPENDENT_AMBULATORY_CARE_PROVIDER_SITE_OTHER): Payer: Medicare HMO

## 2021-07-03 DIAGNOSIS — I442 Atrioventricular block, complete: Secondary | ICD-10-CM

## 2021-07-03 LAB — CUP PACEART REMOTE DEVICE CHECK
Battery Remaining Longevity: 49 mo
Battery Voltage: 2.98 V
Brady Statistic AP VP Percent: 31.6 %
Brady Statistic AP VS Percent: 0 %
Brady Statistic AS VP Percent: 68.4 %
Brady Statistic AS VS Percent: 0 %
Brady Statistic RA Percent Paced: 31.6 %
Brady Statistic RV Percent Paced: 100 %
Date Time Interrogation Session: 20230126094959
Implantable Lead Implant Date: 20160707
Implantable Lead Implant Date: 20160707
Implantable Lead Location: 753859
Implantable Lead Location: 753860
Implantable Lead Model: 5076
Implantable Lead Model: 5076
Implantable Pulse Generator Implant Date: 20160707
Lead Channel Impedance Value: 342 Ohm
Lead Channel Impedance Value: 361 Ohm
Lead Channel Impedance Value: 703 Ohm
Lead Channel Impedance Value: 741 Ohm
Lead Channel Pacing Threshold Amplitude: 0.5 V
Lead Channel Pacing Threshold Amplitude: 0.5 V
Lead Channel Pacing Threshold Pulse Width: 0.4 ms
Lead Channel Pacing Threshold Pulse Width: 0.4 ms
Lead Channel Sensing Intrinsic Amplitude: 17.375 mV
Lead Channel Sensing Intrinsic Amplitude: 17.375 mV
Lead Channel Sensing Intrinsic Amplitude: 3.25 mV
Lead Channel Sensing Intrinsic Amplitude: 3.25 mV
Lead Channel Setting Pacing Amplitude: 1.5 V
Lead Channel Setting Pacing Amplitude: 2.5 V
Lead Channel Setting Pacing Pulse Width: 0.4 ms
Lead Channel Setting Sensing Sensitivity: 1.2 mV

## 2021-07-14 NOTE — Progress Notes (Signed)
Remote pacemaker transmission.   

## 2021-08-05 ENCOUNTER — Encounter: Payer: Self-pay | Admitting: Neurology

## 2021-08-05 NOTE — Telephone Encounter (Signed)
I spoke to the patient. She missed a step and fell on 07/18/21 (no injuries).   She has been under added stress recently.   Reports having a "mild" seizure on 08/04/21. Says she was sitting in a chair coughing then started starring off blankly. Witnessed by her sisters. Lasted about one minute. Felt tired afterwards but back to baseline now.  Current meds: 1) lamotrigine 200mg , one tab BID 2) lamotrigine 25mg , two tabs QHS 3) oxcarbazepine 300mg , one tab BID.  Denies any missed doses. She does not drive.

## 2021-08-29 DIAGNOSIS — J40 Bronchitis, not specified as acute or chronic: Secondary | ICD-10-CM | POA: Diagnosis not present

## 2021-08-29 DIAGNOSIS — R059 Cough, unspecified: Secondary | ICD-10-CM | POA: Diagnosis not present

## 2021-09-23 ENCOUNTER — Ambulatory Visit: Payer: Medicare HMO | Admitting: Pulmonary Disease

## 2021-09-23 ENCOUNTER — Encounter: Payer: Self-pay | Admitting: Pulmonary Disease

## 2021-09-23 VITALS — BP 128/74 | HR 63 | Temp 98.7°F | Ht 61.0 in | Wt 188.0 lb

## 2021-09-23 DIAGNOSIS — R0609 Other forms of dyspnea: Secondary | ICD-10-CM

## 2021-09-23 DIAGNOSIS — R053 Chronic cough: Secondary | ICD-10-CM | POA: Diagnosis not present

## 2021-09-23 MED ORDER — ADVAIR HFA 230-21 MCG/ACT IN AERO: 2.0000 | INHALATION_SPRAY | Freq: Two times a day (BID) | RESPIRATORY_TRACT | 12 refills | Status: DC

## 2021-09-23 MED ORDER — SPACER/AERO-HOLDING CHAMBERS DEVI: 2.0000 | Freq: Two times a day (BID) | 1 refills | Status: AC

## 2021-09-23 MED ORDER — BENZONATATE 200 MG PO CAPS: 200.0000 mg | ORAL_CAPSULE | Freq: Two times a day (BID) | ORAL | 3 refills | Status: DC | PRN

## 2021-09-23 NOTE — Patient Instructions (Signed)
Nice to meet you ? ?I think your cough could be from a couple different things. ? ?I recommend trying a new inhaler every day, Advair 2 puffs twice a day.  With a spacer.  Both were prescribed.  Rinse her mouth out after every use.  Since the albuterol helps I am optimistic this will help.  I am sorry the Symbicort seem to make things worse.  If the Advair makes things worse let me know we have other options. ? ?The cough could also be related to nasal congestion with the Flonase is not well controlled.  We can consider a different nasal spray, adding a nasal spray to the Flonase in the future. ? ?Since you are short of breath, I recommend we get pulmonary function test.  We can perform these at your next follow-up visit in 3 months.  The Advair may help with some of the shortness of breath. ? ? Follow-up in 3 months with PFTs same day. ?

## 2021-09-23 NOTE — Progress Notes (Signed)
? ?'@Patient'$  ID: [name] [name], female    DOB: 10-25-1960, 61 y.o.   MRN: [id number] ? ?Chief Complaint  ?Patient presents with  ? Consult  ?  Consult for chronic cough. Has had cough since January 2022. Pt states she had PNA and then the cough started. Pt states that the tessalon pearls do help with her cough.   ? ? ?Referring provider: ?Willy Eddy, MD ? ?HPI:  ? ?61 y.o. whom we are seeing in consultation for evaluation of chronic cough.  Note from referring provider reviewed.  Most recent allergy and immunology note 06/2021 reviewed.  Discharge summary 2021 reviewed. ? ?Patient admitted to the hospital 2021 respiratory failure.  Thought mostly due to fluid overload.  Possible can condition of aspiration ammonia as well.  Diuresed well with improvement.  Also treated with antibiotics.  Since then, now about 15 months, has had cough.  Seems worse in the evenings.  Mostly dry but is productive at times.  No position make things better or worse.  Albuterol improves cough.  Tried Symbicort a couple months ago with worsened cough and no longer using this.  No other relieving or exacerbating factors.  No seasonal environmental factors she can identify it makes his better or worse.  She does endorse some postnasal drip when lying supine.  Seems worse in the mornings.  Only present for a couple hours in the morning that resolves.  Is on Flonase 1 spray each nostril twice daily for this without significant improvement.  She has a history of GERD but no refractory symptoms while taking once daily pantoprazole. ? ?She does have some associated dyspnea on exertion.  Not a particular concern of hers but does happen when walking longer distances worse on inclines or stairs.  Unclear if albuterol helps this. ? ?Reviewed most recent chest x-ray 04/2021 with my review interpretation reveals clear lungs bilaterally without evidence of effusion, fluid overload, infiltrate etc. ? ? ?PMH: Hypertension, history of CHF, history  of arrhythmia, hyperlipidemia, allergies, sleep apnea ?Surgical history: Tubal ligation 2001 ?Family history: Endorses first relatives with allergies and asthma as well as CAD ?Social history: Current smoker, 20+ pack year, down to 2 to 3 cigarettes a day, lives in [place] ? ? ?Questionaires / Pulmonary Flowsheets:  ? ?ACT:  ?Asthma Control Test ACT Total Score  ?06/25/2021 ? 8:00 AM 19  ? ? ?MMRC: ?   ? View : No data to display.  ?  ?  ?  ? ? ?[place]worth:  ?   ? View : No data to display.  ?  ?  ?  ? ? ?Tests:  ? ?FENO:  ?No results found for: NITRICOXIDE ? ?PFT: ?   ? View : No data to display.  ?  ?  ?  ? ? ?WALK:  ?   ? View : No data to display.  ?  ?  ?  ? ? ?Imaging: ?Personally reviewed and as per EMR and discussion in this note ? ?Lab Results: ?Personally reviewed ?CBC ?   ?Component Value Date/Time  ? WBC 9.1 05/20/2021 1340  ? WBC 10.6 (H) 06/26/2020 0532  ? RBC 4.26 05/20/2021 1340  ? RBC 4.30 06/26/2020 0532  ? HGB 13.3 05/20/2021 1340  ? HCT 39.3 05/20/2021 1340  ? PLT 239 05/20/2021 1340  ? MCV 92 05/20/2021 1340  ? MCH 31.2 05/20/2021 1340  ? MCH 30.5 06/26/2020 0532  ? MCHC 33.8 05/20/2021 1340  ? MCHC 34.1 06/26/2020 0532  ? RDW  12.5 05/20/2021 1340  ? LYMPHSABS 3.1 05/20/2021 1340  ? MONOABS 0.6 06/19/2020 0110  ? EOSABS 0.2 05/20/2021 1340  ? BASOSABS 0.1 05/20/2021 1340  ? ? ?BMET ?   ?Component Value Date/Time  ? NA 145 (H) 05/20/2021 1340  ? K 4.5 05/20/2021 1340  ? CL 105 05/20/2021 1340  ? CO2 22 05/20/2021 1340  ? GLUCOSE 92 05/20/2021 1340  ? GLUCOSE 91 06/26/2020 0532  ? BUN 9 05/20/2021 1340  ? CREATININE 1.27 (H) 05/20/2021 1340  ? CALCIUM 9.1 05/20/2021 1340  ? GFRNONAA 46 (L) 07/29/2020 0902  ? GFRNONAA 48 (L) 06/26/2020 0532  ? GFRAA 53 (L) 07/29/2020 0902  ? ? ?BNP ?   ?Component Value Date/Time  ? BNP 477.8 (H) 06/22/2020 0105  ? ? ?ProBNP ?No results found for: PROBNP ? ?Specialty Problems   ? ?  ? Pulmonary Problems  ? Acute respiratory failure ([place])  ? Chronic cough  ? Other  allergic rhinitis  ? ? ?Allergies  ?Allergen Reactions  ? Zonisamide Anxiety  ?  Panic Attacks  ? Aspirin   ?  sensitivity  ? Aspirin   ?  UNK reaction  ? Ciprofloxacin   ?  Seizures & it keeps her awake  ? Ciprofloxacin   ?  UNK reaction  ? Codeine Hives  ? Hydrocodone Hives  ? Oxycodone Hives  ? Penicillins Hives and Swelling  ?  Did it involve swelling of the face/tongue/throat, SOB, or low BP? Yes ?Did it involve sudden or severe rash/hives, skin peeling, or any reaction on the inside of your mouth or nose? Yes ?Did you need to seek medical attention at a hospital or doctor's office? No ?When did it last happen? 1990's   ?If all above answers are ?NO?, may proceed with cephalosporin use. ?  ? Penicillins Hives and Swelling  ?  Pt tolerated ampicillin/sulbactam without issues 06/2020  ? ? ?Immunization History  ?Administered Date(s) Administered  ? DTaP 03/27/2015  ? Influenza Split 03/22/2009  ? Influenza,inj,Quad PF,6+ Mos 02/19/2016, 03/31/2017, 05/07/2018, 03/04/2021  ? Influenza,inj,quad, With Preservative 03/27/2015  ? Influenza-Unspecified 03/27/2015  ? PFIZER Comirnaty(Gray Top)Covid-19 Tri-Sucrose Vaccine 06/26/2020  ? PFIZER(Purple Top)SARS-COV-2 Vaccination 08/30/2019, 09/20/2019, 06/25/2020  ? Tdap 03/27/2015  ? ? ?Past Medical History:  ?Diagnosis Date  ? Arthritis   ? Chronic insomnia 03/07/2015  ? Diverticulitis   ? GERD (gastroesophageal reflux disease)   ? Gout   ? Hiatal hernia   ? LBBB (left bundle branch block)   ? Pacemaker   ? [employer]   ? Panic disorder 09/07/2014  ? Pneumonia   ? x 1 - Walking  ? Seizures ([place])   ? Last Seizure in 2017 - controlled with meds   ? Sleep apnea   ? does not use a CPAP  ? Wrist tendonitis   ? R   ? ? ?Tobacco History: ?Social History  ? ?Tobacco Use  ?Smoking Status Some Days  ? Packs/day: 0.50  ? Years: 45.00  ? Pack years: 22.50  ? Types: Cigarettes  ? Start date: 20  ? Passive exposure: Current  ?Smokeless Tobacco Never  ?Tobacco Comments  ? Pt states she  smokes 2 cigs a day and sometime 4 a week  ? ?Ready to quit: Not Answered ?Counseling given: Not Answered ?Tobacco comments: Pt states she smokes 2 cigs a day and sometime 4 a week ? ? ?Continue to not smoke ? ?Outpatient Encounter Medications as of 09/23/2021  ?Medication Sig  ? acetaminophen (TYLENOL)  500 MG tablet Take 500-1,000 mg by mouth every 6 (six) hours as needed for moderate pain (pain).   ? albuterol (VENTOLIN HFA) 108 (90 Base) MCG/ACT inhaler Inhale 2 puffs into the lungs every 6 (six) hours as needed for wheezing or shortness of breath.  ? atorvastatin (LIPITOR) 20 MG tablet Take 20 mg by mouth daily.  ? carvedilol (COREG) 3.125 MG tablet Take 3.125 mg by mouth 2 (two) times daily.  ? Cholecalciferol (VITAMIN D3) 2000 units capsule Take 1 capsule (2,000 Units total) by mouth daily.  ? escitalopram (LEXAPRO) 20 MG tablet Take 1 tablet by mouth daily.  ? fluticasone (FLONASE) 50 MCG/ACT nasal spray Place 1 spray into both nostrils 2 (two) times daily as needed (nasal congestion).  ? fluticasone-salmeterol (ADVAIR HFA) 230-21 MCG/ACT inhaler Inhale 2 puffs into the lungs 2 (two) times daily.  ? lamoTRIgine (LAMICTAL) 200 MG tablet Take 1 tablet (200 mg total) by mouth 2 (two) times daily.  ? lamoTRIgine (LAMICTAL) 25 MG tablet Take 2 tablets (50 mg total) by mouth at bedtime.  ? montelukast (SINGULAIR) 10 MG tablet Take 10 mg by mouth daily.  ? Oxcarbazepine (TRILEPTAL) 300 MG tablet Take 1 tablet (300 mg total) by mouth 2 (two) times daily.  ? pantoprazole (PROTONIX) 40 MG tablet Take 1 tablet by mouth daily.  ? Spacer/Aero-Holding Chambers DEVI 2 Inhalers by Does not apply route in the morning and at bedtime.  ? [DISCONTINUED] benzonatate (TESSALON) 100 MG capsule Take by mouth.  ? benzonatate (TESSALON) 200 MG capsule Take 1 capsule (200 mg total) by mouth 2 (two) times daily as needed for cough.  ? [DISCONTINUED] budesonide-formoterol (SYMBICORT) 80-4.5 MCG/ACT inhaler Inhale 2 puffs into the lungs in  the morning and at bedtime. with spacer and rinse mouth afterwards. (Patient not taking: Reported on 09/23/2021)  ? ?No facility-administered encounter medications on file as of 09/23/2021.  ? ? ? ?Review of

## 2021-10-01 ENCOUNTER — Other Ambulatory Visit: Payer: Self-pay | Admitting: Neurology

## 2021-10-02 ENCOUNTER — Ambulatory Visit (INDEPENDENT_AMBULATORY_CARE_PROVIDER_SITE_OTHER): Payer: Medicare HMO

## 2021-10-02 DIAGNOSIS — I442 Atrioventricular block, complete: Secondary | ICD-10-CM | POA: Diagnosis not present

## 2021-10-02 LAB — CUP PACEART REMOTE DEVICE CHECK
Battery Remaining Longevity: 50 mo
Battery Voltage: 2.98 V
Brady Statistic AP VP Percent: 38.33 %
Brady Statistic AP VS Percent: 0 %
Brady Statistic AS VP Percent: 61.67 %
Brady Statistic AS VS Percent: 0 %
Brady Statistic RA Percent Paced: 38.32 %
Brady Statistic RV Percent Paced: 100 %
Date Time Interrogation Session: 20230427125306
Implantable Lead Implant Date: 20160707
Implantable Lead Implant Date: 20160707
Implantable Lead Location: 753859
Implantable Lead Location: 753860
Implantable Lead Model: 5076
Implantable Lead Model: 5076
Implantable Pulse Generator Implant Date: 20160707
Lead Channel Impedance Value: 380 Ohm
Lead Channel Impedance Value: 380 Ohm
Lead Channel Impedance Value: 703 Ohm
Lead Channel Impedance Value: 703 Ohm
Lead Channel Pacing Threshold Amplitude: 0.5 V
Lead Channel Pacing Threshold Amplitude: 0.5 V
Lead Channel Pacing Threshold Pulse Width: 0.4 ms
Lead Channel Pacing Threshold Pulse Width: 0.4 ms
Lead Channel Sensing Intrinsic Amplitude: 17.375 mV
Lead Channel Sensing Intrinsic Amplitude: 17.375 mV
Lead Channel Sensing Intrinsic Amplitude: 2.875 mV
Lead Channel Sensing Intrinsic Amplitude: 2.875 mV
Lead Channel Setting Pacing Amplitude: 1.5 V
Lead Channel Setting Pacing Amplitude: 2.5 V
Lead Channel Setting Pacing Pulse Width: 0.4 ms
Lead Channel Setting Sensing Sensitivity: 1.2 mV

## 2021-10-16 NOTE — Progress Notes (Signed)
Remote pacemaker transmission.   

## 2021-11-28 ENCOUNTER — Telehealth: Payer: Self-pay | Admitting: Pulmonary Disease

## 2021-11-28 DIAGNOSIS — K219 Gastro-esophageal reflux disease without esophagitis: Secondary | ICD-10-CM | POA: Diagnosis not present

## 2021-11-28 DIAGNOSIS — N1832 Chronic kidney disease, stage 3b: Secondary | ICD-10-CM | POA: Diagnosis not present

## 2021-11-28 DIAGNOSIS — E782 Mixed hyperlipidemia: Secondary | ICD-10-CM | POA: Diagnosis not present

## 2021-11-28 DIAGNOSIS — I10 Essential (primary) hypertension: Secondary | ICD-10-CM | POA: Diagnosis not present

## 2021-11-28 NOTE — Telephone Encounter (Signed)
MH,  Are you ok with [place] changing hte Advair HSA 200mg  to [facility].   If so can you give me the dosage and the freq of the Wixela?  Pharmacy states that the Adviar is too expensive for pt.   Just let me know what you are wanting to do with this pt moving forward sir.  Please advise thank you

## 2021-12-01 MED ORDER — FLUTICASONE-SALMETEROL 500-50 MCG/ACT IN AEPB: 1.0000 | INHALATION_SPRAY | Freq: Two times a day (BID) | RESPIRATORY_TRACT | 2 refills | Status: DC

## 2021-12-01 NOTE — Telephone Encounter (Signed)
Called and left detailed message on voicemail about medication change due to price. Nothing further needed at this time

## 2021-12-16 DIAGNOSIS — Z23 Encounter for immunization: Secondary | ICD-10-CM | POA: Diagnosis not present

## 2021-12-16 DIAGNOSIS — Z0000 Encounter for general adult medical examination without abnormal findings: Secondary | ICD-10-CM | POA: Diagnosis not present

## 2021-12-23 ENCOUNTER — Other Ambulatory Visit: Payer: Self-pay | Admitting: Family Medicine

## 2021-12-23 DIAGNOSIS — Z1231 Encounter for screening mammogram for malignant neoplasm of breast: Secondary | ICD-10-CM

## 2021-12-23 DIAGNOSIS — E2839 Other primary ovarian failure: Secondary | ICD-10-CM

## 2021-12-31 ENCOUNTER — Encounter: Payer: Self-pay | Admitting: Pulmonary Disease

## 2021-12-31 ENCOUNTER — Ambulatory Visit (INDEPENDENT_AMBULATORY_CARE_PROVIDER_SITE_OTHER): Payer: Medicare HMO | Admitting: Pulmonary Disease

## 2021-12-31 ENCOUNTER — Ambulatory Visit: Payer: Medicare HMO | Admitting: Pulmonary Disease

## 2021-12-31 VITALS — BP 124/66 | HR 60 | Temp 98.5°F | Ht 61.0 in | Wt 180.0 lb

## 2021-12-31 DIAGNOSIS — R0609 Other forms of dyspnea: Secondary | ICD-10-CM

## 2021-12-31 DIAGNOSIS — F419 Anxiety disorder, unspecified: Secondary | ICD-10-CM

## 2021-12-31 LAB — PULMONARY FUNCTION TEST
DL/VA % pred: 91 %
DL/VA: 3.92 ml/min/mmHg/L
DLCO cor % pred: 79 %
DLCO cor: 14.29 ml/min/mmHg
DLCO unc % pred: 79 %
DLCO unc: 14.29 ml/min/mmHg
FEF 25-75 Post: 1.99 L/sec
FEF 25-75 Pre: 1.26 L/sec
FEF2575-%Change-Post: 57 %
FEF2575-%Pred-Post: 95 %
FEF2575-%Pred-Pre: 60 %
FEV1-%Change-Post: 10 %
FEV1-%Pred-Post: 73 %
FEV1-%Pred-Pre: 66 %
FEV1-Post: 1.62 L
FEV1-Pre: 1.47 L
FEV1FVC-%Change-Post: 2 %
FEV1FVC-%Pred-Pre: 101 %
FEV6-%Change-Post: 7 %
FEV6-%Pred-Post: 71 %
FEV6-%Pred-Pre: 66 %
FEV6-Post: 2 L
FEV6-Pre: 1.86 L
FEV6FVC-%Pred-Post: 103 %
FEV6FVC-%Pred-Pre: 103 %
FVC-%Change-Post: 7 %
FVC-%Pred-Post: 69 %
FVC-%Pred-Pre: 64 %
FVC-Post: 2 L
FVC-Pre: 1.86 L
Post FEV1/FVC ratio: 81 %
Post FEV6/FVC ratio: 100 %
Pre FEV1/FVC ratio: 79 %
Pre FEV6/FVC Ratio: 100 %
RV % pred: 116 %
RV: 2.19 L
TLC % pred: 90 %
TLC: 4.19 L

## 2021-12-31 MED ORDER — ESCITALOPRAM OXALATE 20 MG PO TABS: 20.0000 mg | ORAL_TABLET | Freq: Every day | ORAL | 0 refills | Status: AC

## 2021-12-31 MED ORDER — FLUTICASONE-SALMETEROL 500-50 MCG/ACT IN AEPB: 1.0000 | INHALATION_SPRAY | Freq: Two times a day (BID) | RESPIRATORY_TRACT | 6 refills | Status: DC

## 2021-12-31 NOTE — Patient Instructions (Signed)
Full PFT performed today. °

## 2021-12-31 NOTE — Patient Instructions (Signed)
Nice to see you again  I am glad the medication of help with the cough and the shortness of breath.  Your pulmonary function tests today are largely normal with the exception of some air trapping or extra air left in your lungs when you blow out.  This makes sense that the inhalers are helping with this.  Continue Wixela 1 puff twice a day.  Rinse your mouth with every use.  I sent a refill in for escitalopram 1 tablet at night.  This is to help with nerves or stress.  I provided 30 days.  I recommend you discuss with your primary care office (I understand that your doctor is retiring) or the managed care visit coming up for further refills and recommendations with symptoms.  Return to clinic in 6 months or sooner if needed with Dr. Silas Flood

## 2021-12-31 NOTE — Progress Notes (Signed)
$'@Patient'E$  ID: [name] [name], female    DOB: 10/31/1960, 61 y.o.   MRN: [id number]  Chief Complaint  Patient presents with   Follow-up    Pt is here for follow up for DOE. Pt states her cough is doing better, she also states breathing is better. Pt is on Advair daily and Albuterol as needed. She states that the Advair does help with her ODE.     Referring provider: Aretta Nip, MD  HPI:   61 y.o. whom we are seeing in follow-up for evaluation of chronic cough.    Returns for routine follow-up.  Overall doing well.  Was placed on Advair at last visit for concern for smoking-related versus asthma related cough.  This greatly improved cough.  Sometimes does have spasms of cough but overall this is relatively rare and day-to-day her cough is nearly resolved.  Her dyspnea on exertion is a bit better as well.  About a month ago, insurance changed.  Advair no longer covered.  She is not using high-dose Wixela.  PFTs were performed today.  These were reviewed in detail with patient with interpretation as below.  HPI initial visit: Patient admitted to the hospital 2021 respiratory failure.  Thought mostly due to fluid overload.  Possible can condition of aspiration ammonia as well.  Diuresed well with improvement.  Also treated with antibiotics.  Since then, now about 15 months, has had cough.  Seems worse in the evenings.  Mostly dry but is productive at times.  No position make things better or worse.  Albuterol improves cough.  Tried Symbicort a couple months ago with worsened cough and no longer using this.  No other relieving or exacerbating factors.  No seasonal environmental factors she can identify it makes his better or worse.  She does endorse some postnasal drip when lying supine.  Seems worse in the mornings.  Only present for a couple hours in the morning that resolves.  Is on Flonase 1 spray each nostril twice daily for this without significant improvement.  She has a history of  GERD but no refractory symptoms while taking once daily pantoprazole.  She does have some associated dyspnea on exertion.  Not a particular concern of hers but does happen when walking longer distances worse on inclines or stairs.  Unclear if albuterol helps this.  Reviewed most recent chest x-ray 04/2021 with my review interpretation reveals clear lungs bilaterally without evidence of effusion, fluid overload, infiltrate etc.   PMH: Hypertension, history of CHF, history of arrhythmia, hyperlipidemia, allergies, sleep apnea Surgical history: Tubal ligation 2001 Family history: Endorses first relatives with allergies and asthma as well as CAD Social history: Current smoker, 20+ pack year, down to 2 to 3 cigarettes a day, lives in [place] / Pulmonary Flowsheets:   ACT:  Asthma Control Test ACT Total Score  06/25/2021  8:00 AM 19    MMRC:     No data to display           Epworth:      No data to display           Tests:   FENO:  No results found for: "NITRICOXIDE"  PFT:    Latest Ref Rng & Units 12/31/2021    8:42 AM  PFT Results  FVC-Pre L 1.86  P  FVC-Predicted Pre % 64  P  FVC-Post L 2.00  P  FVC-Predicted Post % 69  P  Pre FEV1/FVC % % 79  P  Post FEV1/FCV % % 81  P  FEV1-Pre L 1.47  P  FEV1-Predicted Pre % 66  P  FEV1-Post L 1.62  P  DLCO uncorrected ml/min/mmHg 14.29  P  DLCO UNC% % 79  P  DLCO corrected ml/min/mmHg 14.29  P  DLCO COR %Predicted % 79  P  DLVA Predicted % 91  P  TLC L 4.19  P  TLC % Predicted % 90  P  RV % Predicted % 116  P    P Preliminary result   Personally reviewed and interpreted as spirometry suggestive of moderate restriction versus air trapping.  No bronchodilator response.  Lung volumes consistent with air trapping with elevated RV to TLC ratio, TLC within normal limits.  DLCO within normal limits.  WALK:      No data to display           Imaging: Personally reviewed and as per EMR and  discussion in this note  Lab Results: Personally reviewed CBC    Component Value Date/Time   WBC 9.1 05/20/2021 1340   WBC 10.6 (H) 06/26/2020 0532   RBC 4.26 05/20/2021 1340   RBC 4.30 06/26/2020 0532   HGB 13.3 05/20/2021 1340   HCT 39.3 05/20/2021 1340   PLT 239 05/20/2021 1340   MCV 92 05/20/2021 1340   MCH 31.2 05/20/2021 1340   MCH 30.5 06/26/2020 0532   MCHC 33.8 05/20/2021 1340   MCHC 34.1 06/26/2020 0532   RDW 12.5 05/20/2021 1340   LYMPHSABS 3.1 05/20/2021 1340   MONOABS 0.6 06/19/2020 0110   EOSABS 0.2 05/20/2021 1340   BASOSABS 0.1 05/20/2021 1340    BMET    Component Value Date/Time   NA 145 (H) 05/20/2021 1340   K 4.5 05/20/2021 1340   CL 105 05/20/2021 1340   CO2 22 05/20/2021 1340   GLUCOSE 92 05/20/2021 1340   GLUCOSE 91 06/26/2020 0532   BUN 9 05/20/2021 1340   CREATININE 1.27 (H) 05/20/2021 1340   CALCIUM 9.1 05/20/2021 1340   GFRNONAA 46 (L) 07/29/2020 0902   GFRNONAA 48 (L) 06/26/2020 0532   GFRAA 53 (L) 07/29/2020 0902    BNP    Component Value Date/Time   BNP 477.8 (H) 06/22/2020 0105    ProBNP No results found for: "PROBNP"  Specialty Problems       Pulmonary Problems   Acute respiratory failure (HCC)   Chronic cough   Other allergic rhinitis    Allergies  Allergen Reactions   Zonisamide Anxiety    Panic Attacks   Aspirin     sensitivity   Aspirin     UNK reaction   Ciprofloxacin     Seizures & it keeps her awake   Ciprofloxacin     UNK reaction   Codeine Hives   Hydrocodone Hives   Oxycodone Hives   Penicillins Hives and Swelling    Did it involve swelling of the face/tongue/throat, SOB, or low BP? Yes Did it involve sudden or severe rash/hives, skin peeling, or any reaction on the inside of your mouth or nose? Yes Did you need to seek medical attention at a hospital or doctor's office? No When did it last happen? 1990's   If all above answers are "NO", may proceed with cephalosporin use.    Penicillins Hives  and Swelling    Pt tolerated ampicillin/sulbactam without issues 06/2020    Immunization History  Administered Date(s) Administered   DTaP 03/27/2015   Influenza Split 03/22/2009  Influenza,inj,Quad PF,6+ Mos 02/19/2016, 03/31/2017, 05/07/2018, 03/04/2021   Influenza,inj,quad, With Preservative 03/27/2015   Influenza-Unspecified 03/27/2015   PFIZER Comirnaty(Gray Top)Covid-19 Tri-Sucrose Vaccine 06/26/2020   PFIZER(Purple Top)SARS-COV-2 Vaccination 08/30/2019, 09/20/2019, 06/25/2020   Tdap 03/27/2015    Past Medical History:  Diagnosis Date   Arthritis    Chronic insomnia 03/07/2015   Diverticulitis    GERD (gastroesophageal reflux disease)    Gout    Hiatal hernia    LBBB (left bundle branch block)    Pacemaker    Medtronic    Panic disorder 09/07/2014   Pneumonia    x 1 - Walking   Seizures ([place])    Last Seizure in 2017 - controlled with meds    Sleep apnea    does not use a CPAP   Wrist tendonitis    R     Tobacco History: Social History   Tobacco Use  Smoking Status Some Days   Packs/day: 0.50   Years: 45.00   Total pack years: 22.50   Types: Cigarettes   Start date: 1977   Passive exposure: Current  Smokeless Tobacco Never  Tobacco Comments   Pt states she smokes 2 cigs a day and sometime 4 a week   Ready to quit: Not Answered Counseling given: Not Answered Tobacco comments: Pt states she smokes 2 cigs a day and sometime 4 a week   Continue to not smoke  Outpatient Encounter Medications as of 12/31/2021  Medication Sig   acetaminophen (TYLENOL) 500 MG tablet Take 500-1,000 mg by mouth every 6 (six) hours as needed for moderate pain (pain).    albuterol (VENTOLIN HFA) 108 (90 Base) MCG/ACT inhaler Inhale 2 puffs into the lungs every 6 (six) hours as needed for wheezing or shortness of breath.   atorvastatin (LIPITOR) 20 MG tablet Take 20 mg by mouth daily.   benzonatate (TESSALON) 200 MG capsule Take 1 capsule (200 mg total) by mouth 2 (two) times  daily as needed for cough.   carvedilol (COREG) 3.125 MG tablet Take 3.125 mg by mouth 2 (two) times daily.   Cholecalciferol (VITAMIN D3) 2000 units capsule Take 1 capsule (2,000 Units total) by mouth daily.   fluticasone (FLONASE) 50 MCG/ACT nasal spray Place 1 spray into both nostrils 2 (two) times daily as needed (nasal congestion).   fluticasone-salmeterol (WIXELA INHUB) 500-50 MCG/ACT AEPB Inhale 1 puff into the lungs in the morning and at bedtime.   lamoTRIgine (LAMICTAL) 200 MG tablet Take 1 tablet (200 mg total) by mouth 2 (two) times daily.   lamoTRIgine (LAMICTAL) 25 MG tablet Take 2 tablets (50 mg total) by mouth at bedtime.   montelukast (SINGULAIR) 10 MG tablet Take 10 mg by mouth daily.   Oxcarbazepine (TRILEPTAL) 300 MG tablet Take 1 tablet (300 mg total) by mouth 2 (two) times daily.   pantoprazole (PROTONIX) 40 MG tablet Take 1 tablet by mouth daily.   Spacer/Aero-Holding Chambers DEVI 2 Inhalers by Does not apply route in the morning and at bedtime.   [DISCONTINUED] escitalopram (LEXAPRO) 20 MG tablet Take 1 tablet by mouth daily.   [DISCONTINUED] fluticasone-salmeterol (ADVAIR HFA) 230-21 MCG/ACT inhaler Inhale 2 puffs into the lungs 2 (two) times daily.   escitalopram (LEXAPRO) 20 MG tablet Take 1 tablet (20 mg total) by mouth daily.   No facility-administered encounter medications on file as of 12/31/2021.     Review of Systems  Review of Systems  N/a Physical Exam  BP 124/66 (BP Location: Left Arm, Patient Position: Sitting, Cuff Size:  Normal)   Pulse 60   Temp 98.5 F (36.9 C) (Oral)   Ht '5\' 1"'$  (1.549 m)   Wt 180 lb (81.6 kg)   LMP  (LMP Unknown)   SpO2 95%   BMI 34.01 kg/m   Wt Readings from Last 5 Encounters:  12/31/21 180 lb (81.6 kg)  09/23/21 188 lb (85.3 kg)  06/25/21 189 lb (85.7 kg)  05/20/21 187 lb (84.8 kg)  05/19/21 185 lb 9.6 oz (84.2 kg)    BMI Readings from Last 5 Encounters:  12/31/21 34.01 kg/m  09/23/21 35.52 kg/m  06/25/21  35.71 kg/m  05/20/21 35.33 kg/m  05/19/21 35.07 kg/m     Physical Exam General: Sitting in chair, no acute distress Eyes: EOMI, icterus Neck: Supple, JVP Pulmonary: Clear, normal work of breathing Cardiovascular: Regular rate and rhythm, no murmur Abdomen: Nondistended MS hospital MSK: No synovitis, joint effusion Neuro: Normal gait, no weakness Psych: Normal mood, full affect   Assessment & Plan:   Chronic cough: Present  since time of hospitalization for aspiration pneumonia and fluid overload in 2022.  She thinks related to multiple intubations around that time.  Given concern for infection of the time, concern for possible lingering inflammation, asthma versus smoking-related chronic cough.  Albuterol helps which makes this more likely.  Start Advair high-dose HFA 2 puffs twice a day with spacer subsidy changed to [employer] per insurance formulary.  Cough is greatly improved.  Frequency improved.  Using [employer] as needed with good effect.  Dyspnea on exertion: Mild.  Suspect related to deconditioning, possible asthma, smoking-related disease as above.  PFTs largely normal with the exception of air trapping.  Suspect related to emphysema versus asthma.  Worsening outside environment raises suspicion for asthma.  Continue high-dose ICS/LABA via Wixela.  Anxiety/stress: Placed on escitalopram in the past.  This medication was left at her sisters as she has been caring for her after death of sister's husband and baseline stroke with sister.  Without this symptoms of anxiety, easily irritability have increased.  Escitalopram refilled today, 30-day supply.  Encouraged further evaluation and refills need to go through her primary care provider.   Return in about 6 months (around 07/03/2022).   Lanier Clam, MD 12/31/2021

## 2021-12-31 NOTE — Progress Notes (Signed)
Full PFT performed today. °

## 2022-01-01 ENCOUNTER — Ambulatory Visit (INDEPENDENT_AMBULATORY_CARE_PROVIDER_SITE_OTHER): Payer: Medicare HMO

## 2022-01-01 DIAGNOSIS — I442 Atrioventricular block, complete: Secondary | ICD-10-CM

## 2022-01-02 LAB — CUP PACEART REMOTE DEVICE CHECK
Battery Remaining Longevity: 44 mo
Battery Voltage: 2.98 V
Brady Statistic AP VP Percent: 44.69 %
Brady Statistic AP VS Percent: 0 %
Brady Statistic AS VP Percent: 55.31 %
Brady Statistic AS VS Percent: 0 %
Brady Statistic RA Percent Paced: 44.66 %
Brady Statistic RV Percent Paced: 100 %
Date Time Interrogation Session: 20230727204418
Implantable Lead Implant Date: 20160707
Implantable Lead Implant Date: 20160707
Implantable Lead Location: 753859
Implantable Lead Location: 753860
Implantable Lead Model: 5076
Implantable Lead Model: 5076
Implantable Pulse Generator Implant Date: 20160707
Lead Channel Impedance Value: 361 Ohm
Lead Channel Impedance Value: 380 Ohm
Lead Channel Impedance Value: 703 Ohm
Lead Channel Impedance Value: 741 Ohm
Lead Channel Pacing Threshold Amplitude: 0.5 V
Lead Channel Pacing Threshold Amplitude: 0.5 V
Lead Channel Pacing Threshold Pulse Width: 0.4 ms
Lead Channel Pacing Threshold Pulse Width: 0.4 ms
Lead Channel Sensing Intrinsic Amplitude: 17.375 mV
Lead Channel Sensing Intrinsic Amplitude: 17.375 mV
Lead Channel Sensing Intrinsic Amplitude: 2.5 mV
Lead Channel Sensing Intrinsic Amplitude: 2.5 mV
Lead Channel Setting Pacing Amplitude: 1.5 V
Lead Channel Setting Pacing Amplitude: 2.5 V
Lead Channel Setting Pacing Pulse Width: 0.4 ms
Lead Channel Setting Sensing Sensitivity: 1.2 mV

## 2022-01-16 ENCOUNTER — Ambulatory Visit: Payer: Medicare HMO

## 2022-01-23 NOTE — Progress Notes (Signed)
Remote pacemaker transmission.   

## 2022-02-03 DIAGNOSIS — B029 Zoster without complications: Secondary | ICD-10-CM | POA: Diagnosis not present

## 2022-02-04 ENCOUNTER — Ambulatory Visit: Payer: Medicare HMO

## 2022-02-08 NOTE — Progress Notes (Deleted)
Cardiology Office Note Date:  02/08/2022  Patient ID:  [name] [name], [name] [name] Aug 21, 1960, MRN [id number] PCP:  Aretta Nip, MD  Electrophysiologist: Dr. Caryl Comes    Chief Complaint:   6 mo, VP%  History of Present Illness: [name] [name] is a 61 y.o. female with history of LBBB, CHB w/PPM, seizure d/o (follows with neurology), OSA no CPAP, HTN.  She comes today to be seen for Dr. Caryl Comes, last seen by him Jan 2021, at that time had been very liberal with sodium intake (perhaps had been recommended by someone) and she was edematous, and instructed to reduce her Na intake.  Noted 1%VP w/MVP on.  She was admitted to [facility] 06/19/20 with respiratory failure suspect 2/2 aspiration, in the ER Found hypoxic at 60%.  Brought to ED where patient had seizure-like episode and then become unresponsive requiring intubation on mechanical ventilator.  Treated with Zosyn initially. Hydrated for AKI.  Extubated the next day. Cardiology was consulted for new reduction in LVEF to 40-45% Not felt to have been volume OL EF change suspect 2/2 stress vs RV pacing. Signed off 06/25/20 Discharged 06/26/20 Aspiration penuonia   I saw her 07/29/20 She is much better since her hospital stay No further seizures, states her neurologist feklt the seizures were lkely provoked by hypoxia. Breathing is good, no DOE, SOB, no symptoms of orthopnea or PND. No CP, palpitations Tolerating the medications without side effects Did not think BP had room to titrate meds Planned for an echo in a few months with f/u afterwards  Echo noted LVEF 55%, grade I DD  I saw her 10/21/20 She is doing well. No CP, palpitations or cardiac awareness. No dizzy spells, near syncope or syncope. No SOB No symptoms of PND or orthopnea She has gained a few pounds, her PMD stopped her lasix late Feb/early March, she continues to take K+  Device interrogation noted with addition and titration of coreg for her NICM resulted in 100% RV  pacing despite MVP mode and her coreg reduced to try and reduce RV pacing% K+ stopped given she was off lasix and planned for labs  I saw her 03/11/21 She is doing OK, saw her PMD recently, discussed a dry, hacking/irritating cough her primary had her start an allergy medicine, this has not helped much She does have GERD and historically has been terrible Not particularly worse at night Not productive, no SOB No CP, palpitations or cardiac awareness No dizzy spells, near syncope or syncope  No particular change in any way on lower coreg dose  Discussed unclear etiology of her CM, she was not RV pacing much at the time of the diagnosis though with addition of coreg was 100% RV pacing. Discussed that we would try and titrated her coreg down and wean off to avoid RV pacing, if she continued to have RV pacing despite being off coreg would plan to reprogram her device (turn of MVP) and resume her BB. She had a dry cough, ? ARB vs GERD  I saw her 05/19/21 She is doing "OK" She saw her PMD for her cough, the losartan was stopped without significant improvement a couple weeks later started on montelukast and this seemed to help quite a bit but still gets into coughing fits that are pretty tough. No CP, palpitations or cardiac awareness No SOB, DOE No near syncope or syncope, but sometimes she coughs so hard she sees stars! She is also bothered by her RLS of late She is off  the coreg for a month or so  She remained device dependent despite off coreg MVP was programmed > DDD and her coreg resumed. Remained off ARB ( though cough did not get better) she was pending allergist visit and asked to discuss with them if we could resume it.  She saw pulmonary 12/31/21, cough much better, almost resolved, no longer daily coughing.  PFTs that day largely normal with exception of air trapping.   Outside symptoms worse and suspect asthma.  *** symptoms *** ?resume ARB *** recheck echo prior to  ARB?  Device information MDT dual chamber PPM, implanted 12/13/2014   Past Medical History:  Diagnosis Date   Arthritis    Chronic insomnia 03/07/2015   Diverticulitis    GERD (gastroesophageal reflux disease)    Gout    Hiatal hernia    LBBB (left bundle branch block)    Pacemaker    [employer]    Panic disorder 09/07/2014   Pneumonia    x 1 - Walking   Seizures ([place])    Last Seizure in 2017 - controlled with meds    Sleep apnea    does not use a CPAP   Wrist tendonitis    R     Past Surgical History:  Procedure Laterality Date   COLONOSCOPY  last 03/31/2016   COLONOSCOPY WITH PROPOFOL N/A 04/04/2019   Procedure: COLONOSCOPY WITH PROPOFOL;  Surgeon: Mauri Pole, MD;  Location: [facility];  Service: Endoscopy;  Laterality: N/A;   COLONOSCOPY WITH PROPOFOL N/A 06/15/2019   Procedure: COLONOSCOPY WITH PROPOFOL;  Surgeon: Rush Landmark Telford Nab., MD;  Location: [place];  Service: Gastroenterology;  Laterality: N/A;   COLONOSCOPY WITH PROPOFOL N/A 02/28/2020   Procedure: COLONOSCOPY WITH PROPOFOL;  Surgeon: Rush Landmark Telford Nab., MD;  Location: [facility];  Service: Gastroenterology;  Laterality: N/A;   ENDOROTOR  02/28/2020   Procedure: [id number];  Surgeon: Mansouraty, Telford Nab., MD;  Location: [facility];  Service: Gastroenterology;;   ENDOSCOPIC MUCOSAL RESECTION N/A 06/15/2019   Procedure: ENDOSCOPIC MUCOSAL RESECTION;  Surgeon: Irving Copas., MD;  Location: [place];  Service: Gastroenterology;  Laterality: N/A;   ENDOSCOPIC MUCOSAL RESECTION N/A 02/28/2020   Procedure: ENDOSCOPIC MUCOSAL RESECTION;  Surgeon: Rush Landmark Telford Nab., MD;  Location: [facility];  Service: Gastroenterology;  Laterality: N/A;   EP IMPLANTABLE DEVICE N/A 12/13/2014   Procedure: Pacemaker Implant;  Surgeon: Deboraha Sprang, MD;  Location: [place] [facility];  Service: Cardiovascular;  Laterality: N/A;   HAND SURGERY     HEMOSTASIS CLIP PLACEMENT  06/15/2019   Procedure:  HEMOSTASIS CLIP PLACEMENT;  Surgeon: Irving Copas., MD;  Location: [place];  Service: Gastroenterology;;   HEMOSTASIS CLIP PLACEMENT  02/28/2020   Procedure: HEMOSTASIS CLIP PLACEMENT;  Surgeon: Irving Copas., MD;  Location: [facility];  Service: Gastroenterology;;   HEMOSTASIS CONTROL  02/28/2020   Procedure: HEMOSTASIS CONTROL;  Surgeon: Irving Copas., MD;  Location: [facility];  Service: Gastroenterology;;   POLYPECTOMY     POLYPECTOMY  04/04/2019   Procedure: POLYPECTOMY;  Surgeon: Mauri Pole, MD;  Location: [facility];  Service: Endoscopy;;   POLYPECTOMY  06/15/2019   Procedure: POLYPECTOMY;  Surgeon: Irving Copas., MD;  Location: [place];  Service: Gastroenterology;;   SUBMUCOSAL LIFTING INJECTION  06/15/2019   Procedure: SUBMUCOSAL LIFTING INJECTION;  Surgeon: Irving Copas., MD;  Location: [place];  Service: Gastroenterology;;   TUBAL LIGATION     UPPER GI ENDOSCOPY     WISDOM TOOTH EXTRACTION  Current Outpatient Medications  Medication Sig Dispense Refill   acetaminophen (TYLENOL) 500 MG tablet Take 500-1,000 mg by mouth every 6 (six) hours as needed for moderate pain (pain).      albuterol (VENTOLIN HFA) 108 (90 Base) MCG/ACT inhaler Inhale 2 puffs into the lungs every 6 (six) hours as needed for wheezing or shortness of breath. 8 g 2   atorvastatin (LIPITOR) 20 MG tablet Take 20 mg by mouth daily.     benzonatate (TESSALON) 200 MG capsule Take 1 capsule (200 mg total) by mouth 2 (two) times daily as needed for cough. 60 capsule 3   carvedilol (COREG) 3.125 MG tablet Take 3.125 mg by mouth 2 (two) times daily.     Cholecalciferol (VITAMIN D3) 2000 units capsule Take 1 capsule (2,000 Units total) by mouth daily. 90 capsule 0   escitalopram (LEXAPRO) 20 MG tablet Take 1 tablet (20 mg total) by mouth daily. 30 tablet 0   fluticasone (FLONASE) 50 MCG/ACT nasal spray Place 1 spray into both nostrils 2 (two)  times daily as needed (nasal congestion). 16 g 5   fluticasone-salmeterol (WIXELA INHUB) 500-50 MCG/ACT AEPB Inhale 1 puff into the lungs in the morning and at bedtime. 60 each 6   lamoTRIgine (LAMICTAL) 200 MG tablet Take 1 tablet (200 mg total) by mouth 2 (two) times daily. 180 tablet 3   lamoTRIgine (LAMICTAL) 25 MG tablet Take 2 tablets (50 mg total) by mouth at bedtime. 180 tablet 3   montelukast (SINGULAIR) 10 MG tablet Take 10 mg by mouth daily.     Oxcarbazepine (TRILEPTAL) 300 MG tablet Take 1 tablet (300 mg total) by mouth 2 (two) times daily. 180 tablet 3   pantoprazole (PROTONIX) 40 MG tablet Take 1 tablet by mouth daily.     Spacer/Aero-Holding Chambers DEVI 2 Inhalers by Does not apply route in the morning and at bedtime. 1 each 1   No current facility-administered medications for this visit.    Allergies:   Zonisamide, Aspirin, Aspirin, Ciprofloxacin, Ciprofloxacin, Codeine, Hydrocodone, Oxycodone, Penicillins, and Penicillins   Social History:  The patient  reports that she has been smoking cigarettes. She started smoking about 46 years ago. She has a 22.50 pack-year smoking history. She has been exposed to tobacco smoke. She has never used smokeless tobacco. She reports that she does not use drugs.   Family History:  The patient's family history includes Allergic rhinitis in her brother, father, mother, sister, and sister; Alzheimer's disease in her mother; Colon cancer in her paternal aunt; Colon polyps in her brother, mother, sister, and sister; Coronary artery disease in her maternal grandfather; Diabetes in her sister; Hypertension in her brother, father, and mother; Kidney failure in her sister; Leukemia in her father; Parkinson's disease in her maternal grandmother; Prostate cancer in her father; Sleep apnea in her brother; Stroke in her brother and father.  ROS:  Please see the history of present illness.    All other systems are reviewed and otherwise negative.    PHYSICAL EXAM:  VS:  LMP  (LMP Unknown)  BMI: There is no height or weight on file to calculate BMI. Well nourished, well developed, in no acute distress HEENT: normocephalic, atraumatic Neck: no JVD, carotid bruits or masses Cardiac:  *** RRR; no significant murmurs, no rubs, or gallops Lungs:  *** CTA b/l, no wheezing, rhonchi or rales Abd: soft, nontender MS: no deformity or atrophy Ext:  *** no edema Skin: warm and dry, no rash Neuro:  No gross deficits  appreciated Psych: euthymic mood, full affect  *** PPM site is stable, no tethering or discomfort  EKG:  not done today  Device interrogation done today and reviewed by myself:  ***    09/18/20: TTE IMPRESSIONS   1. Left ventricular ejection fraction, by estimation, is 55%. The left  ventricle has no regional wall motion abnormalities. Left ventricular  diastolic parameters are consistent with Grade I diastolic dysfunction  (impaired relaxation).   2. Right ventricular systolic function is normal. The right ventricular  size is normal. Peak RV-RA gradient 20 mmHg.   3. Left atrial size was mildly dilated.   4. The mitral valve is normal in structure. No evidence of mitral valve  regurgitation. No evidence of mitral stenosis.   5. The IVC was not well-visualized.   6. Echo is limited.    Transthoracic Echocardiogram: Date: 06/24/20 Results:  1. Left ventricular ejection fraction, by estimation, is 40 to 45%. The  left ventricle has mildly decreased function. The left ventricle  demonstrates global hypokinesis. There is mild concentric left ventricular  hypertrophy. Left ventricular diastolic  parameters are consistent with Grade II diastolic dysfunction  (pseudonormalization).   2. Right ventricular systolic function is normal. The right ventricular  size is normal. There is moderately elevated pulmonary artery systolic  pressure. The estimated right ventricular systolic pressure is 22.9 mmHg.   3. A small  pericardial effusion is present.   4. The mitral valve is normal in structure. Trivial mitral valve  regurgitation.   5. Tricuspid valve regurgitation is mild to moderate.   6. The aortic valve is tricuspid. Aortic valve regurgitation is not  visualized. No aortic stenosis is present.   7. The inferior vena cava is dilated in size with >50% respiratory  variability, suggesting right atrial pressure of 8 mmHg.    12/14/2014: TTE Study Conclusions  - Left ventricle: The cavity size was normal. Wall thickness was    increased in a pattern of mild LVH. Systolic function was normal.    The estimated ejection fraction was in the range of 55% to 60%.    Wall motion was normal; there were no regional wall motion    abnormalities. Doppler parameters are consistent with abnormal    left ventricular relaxation (grade 1 diastolic dysfunction).  - Pericardium, extracardiac: A trivial pericardial effusion was    identified. Features were not consistent with tamponade    physiology.    10/23/2014: lexiscan stress test Low risk pharmacological stress nuclear study with LBBB-related fixed perfusion artifact, otherwise normal perfusion and normal left ventricular regional and global systolic function.  Recent Labs: 05/20/2021: ALT 12; BUN 9; Creatinine, Ser 1.27; Hemoglobin 13.3; Platelets 239; Potassium 4.5; Sodium 145  No results found for requested labs within last 365 days.   CrCl cannot be calculated (Patient's most recent lab result is older than the maximum 21 days allowed.).   Wt Readings from Last 3 Encounters:  12/31/21 180 lb (81.6 kg)  09/23/21 188 lb (85.3 kg)  06/25/21 189 lb (85.7 kg)     Other studies reviewed: Additional studies/records reviewed today include: summarized above  ASSESSMENT AND PLAN:  1. PPM     *** Intact function  2. NICM     suspect stress induced     LVEF has had nice recovery     ***  3. HTN     ***     Disposition: ***   Current medicines are  reviewed at length with the patient today.  The  patient did not have any concerns regarding medicines.  Venetia Night, PA-C 02/08/2022 4:23 PM     [address] [place] [address] [place] [address] [phone] (office)  [phone] (fax)

## 2022-02-11 ENCOUNTER — Encounter: Payer: Medicare HMO | Admitting: Physician Assistant

## 2022-02-25 ENCOUNTER — Ambulatory Visit: Payer: Medicare HMO

## 2022-03-08 NOTE — Progress Notes (Signed)
Cardiology Office Note Date:  03/08/2022  Patient ID:  [name], [name] 08/20/1960, MRN [id number] PCP:  Clayborn Heron, MD  Electrophysiologist: Dr. Graciela Husbands    Chief Complaint:   6 mo, VP%  History of Present Illness: [name] [name] is a 61 y.o. female with history of LBBB, CHB w/PPM, seizure d/o (follows with neurology), OSA no CPAP, HTN.  She comes today to be seen for Dr. Graciela Husbands, last seen by him Jan 2021, at that time had been very liberal with sodium intake (perhaps had been recommended by someone) and she was edematous, and instructed to reduce her Na intake.  Noted 1%VP w/MVP on.  She was admitted to [facility] 06/19/20 with respiratory failure suspect 2/2 aspiration, in the ER Found hypoxic at 60%.  Brought to ED where patient had seizure-like episode and then become unresponsive requiring intubation on mechanical ventilator.  Treated with Zosyn initially. Hydrated for AKI.  Extubated the next day. Cardiology was consulted for new reduction in LVEF to 40-45% Not felt to have been volume OL EF change suspect 2/2 stress vs RV pacing. Signed off 06/25/20 Discharged 06/26/20 Aspiration penuonia   I saw her 07/29/20 She is much better since her hospital stay No further seizures, states her neurologist feklt the seizures were lkely provoked by hypoxia. Breathing is good, no DOE, SOB, no symptoms of orthopnea or PND. No CP, palpitations Tolerating the medications without side effects Did not think BP had room to titrate meds Planned for an echo in a few months with f/u afterwards  Echo noted LVEF 55%, grade I DD  I saw her 10/21/20 She is doing well. No CP, palpitations or cardiac awareness. No dizzy spells, near syncope or syncope. No SOB No symptoms of PND or orthopnea She has gained a few pounds, her PMD stopped her lasix late Feb/early March, she continues to take K+  Device interrogation noted with addition and titration of coreg for her NICM resulted in 100% RV  pacing despite MVP mode and her coreg reduced to try and reduce RV pacing% K+ stopped given she was off lasix and planned for labs  I saw her 03/11/21 She is doing OK, saw her PMD recently, discussed a dry, hacking/irritating cough her primary had her start an allergy medicine, this has not helped much She does have GERD and historically has been terrible Not particularly worse at night Not productive, no SOB No CP, palpitations or cardiac awareness No dizzy spells, near syncope or syncope  No particular change in any way on lower coreg dose  Discussed unclear etiology of her CM, she was not RV pacing much at the time of the diagnosis though with addition of coreg was 100% RV pacing. Discussed that we would try and titrated her coreg down and wean off to avoid RV pacing, if she continued to have RV pacing despite being off coreg would plan to reprogram her device (turn of MVP) and resume her BB. She had a dry cough, ? ARB vs GERD  I saw her 05/19/21 She is doing "OK" She saw her PMD for her cough, the losartan was stopped without significant improvement a couple weeks later started on montelukast and this seemed to help quite a bit but still gets into coughing fits that are pretty tough. No CP, palpitations or cardiac awareness No SOB, DOE No near syncope or syncope, but sometimes she coughs so hard she sees stars! She is also bothered by her RLS of late She is off  the coreg for a month or so  She remained device dependent despite off coreg MVP was programmed > DDD and her coreg resumed. Remained off ARB ( though cough did not get better) she was pending allergist visit and asked to discuss with them if we could resume it.  She saw pulmonary 12/31/21, cough much better, almost resolved, no longer daily coughing.  PFTs that day largely normal with exception of air trapping.   Outside symptoms worse and suspect asthma.  TODAY She has felt dizzy for the last 2 mo. It is a constant sense  of feeling dizzy, perhaps a bit off balance, turning /moving quickly makes it worse When walking she drift to the left or right, has to pay attention to walk straight. No focal weakness, no headache, no N/V No trauma preceding the dizziness She did have a fall a couple weeks ago, walking down the steps at her sisters, missed a step and fell forward, did hit her face/cheek, no pain, no injury She has not saught attention  No CP, palpitations or cardiac awareness No SOB Mild infrequent cough only No near syncope or syncope.  Both sisters now live with her, this is stressful for her.   Device information MDT dual chamber PPM, implanted 12/13/2014   Past Medical History:  Diagnosis Date   Arthritis    Chronic insomnia 03/07/2015   Diverticulitis    GERD (gastroesophageal reflux disease)    Gout    Hiatal hernia    LBBB (left bundle branch block)    Pacemaker    [employer]tronic    Panic disorder 09/07/2014   Pneumonia    x 1 - Walking   Seizures (HCC)    Last Seizure in 2017 - controlled with meds    Sleep apnea    does not use a CPAP   Wrist tendonitis    R     Past Surgical History:  Procedure Laterality Date   COLONOSCOPY  last 03/31/2016   COLONOSCOPY WITH PROPOFOL N/A 04/04/2019   Procedure: COLONOSCOPY WITH PROPOFOL;  Surgeon: Napoleon Form, MD;  Location: [facility] ENDOSCOPY;  Service: Endoscopy;  Laterality: N/A;   COLONOSCOPY WITH PROPOFOL N/A 06/15/2019   Procedure: COLONOSCOPY WITH PROPOFOL;  Surgeon: Meridee Score Netty Starring., MD;  Location[facility] ENDOSCOPY;  Service: Gastroenterology;  Laterality: N/A;   COLONOSCOPY WITH PROPOFOL N/A 02/28/2020   Procedure: COLONOSCOPY WITH PROPOFOL;  Surgeon: Meridee Score Netty Starring., MD;  Location: [facility] ENDOSCOPY;  Service: Gastroenterology;  Laterality: N/A;   ENDOROTOR  02/28/2020   Procedure: [id number];  Surgeon: Mansouraty, Netty Starring., MD;  Location: [facility] ENDOSCOPY;  Service: Gastroenterology;;   ENDOSCOPIC MUCOSAL RESECTION N/A 06/15/2019    Procedure: ENDOSCOPIC MUCOSAL RESECTION;  Surgeon: Lemar Lofty., MD;  Location: [facility] ENDOSCOPY;  Service: Gastroenterology;  Laterality: N/A;   ENDOSCOPIC MUCOSAL RESECTION N/A 02/28/2020   Procedure: ENDOSCOPIC MUCOSAL RESECTION;  Surgeon: Meridee Score Netty Starring., MD;  Location: [facility] ENDOSCOPY;  Service: Gastroenterology;  Laterality: N/A;   EP IMPLANTABLE DEVICE N/A 12/13/2014   Procedure: Pacemaker Implant;  Surgeon: Duke Salvia, MD;  Location: [facility] INVASIVE CV LAB;  Service: Cardiovascular;  Laterality: N/A;   HAND SURGERY     HEMOSTASIS CLIP PLACEMENT  06/15/2019   Procedure: HEMOSTASIS CLIP PLACEMENT;  Surgeon: Lemar Lofty., MD;  Location: [facility] ENDOSCOPY;  Service: Gastroenterology;;   HEMOSTASIS CLIP PLACEMENT  02/28/2020   Procedure: HEMOSTASIS CLIP PLACEMENT;  Surgeon: Lemar Lofty., MD;  Location: [facility] ENDOSCOPY;  Service: Gastroenterology;;   HEMOSTASIS CONTROL  02/28/2020  Procedure: HEMOSTASIS CONTROL;  Surgeon: Lemar Lofty., MD;  Location: [facility] ENDOSCOPY;  Service: Gastroenterology;;   POLYPECTOMY     POLYPECTOMY  04/04/2019   Procedure: POLYPECTOMY;  Surgeon: Napoleon Form, MD;  Location: [facility] ENDOSCOPY;  Service: Endoscopy;;   POLYPECTOMY  06/15/2019   Procedure: POLYPECTOMY;  Surgeon: Lemar Lofty., MD;  Location: [facility] ENDOSCOPY;  Service: Gastroenterology;;   SUBMUCOSAL LIFTING INJECTION  06/15/2019   Procedure: SUBMUCOSAL LIFTING INJECTION;  Surgeon: Lemar Lofty., MD;  Location: [facility] ENDOSCOPY;  Service: Gastroenterology;;   TUBAL LIGATION     UPPER GI ENDOSCOPY     WISDOM TOOTH EXTRACTION      Current Outpatient Medications  Medication Sig Dispense Refill   acetaminophen (TYLENOL) 500 MG tablet Take 500-1,000 mg by mouth every 6 (six) hours as needed for moderate pain (pain).      albuterol (VENTOLIN HFA) 108 (90 Base) MCG/ACT inhaler Inhale 2 puffs into the lungs every 6 (six) hours as needed for wheezing or shortness of  breath. 8 g 2   atorvastatin (LIPITOR) 20 MG tablet Take 20 mg by mouth daily.     benzonatate (TESSALON) 200 MG capsule Take 1 capsule (200 mg total) by mouth 2 (two) times daily as needed for cough. 60 capsule 3   carvedilol (COREG) 3.125 MG tablet Take 3.125 mg by mouth 2 (two) times daily.     Cholecalciferol (VITAMIN D3) 2000 units capsule Take 1 capsule (2,000 Units total) by mouth daily. 90 capsule 0   escitalopram (LEXAPRO) 20 MG tablet Take 1 tablet (20 mg total) by mouth daily. 30 tablet 0   fluticasone (FLONASE) 50 MCG/ACT nasal spray Place 1 spray into both nostrils 2 (two) times daily as needed (nasal congestion). 16 g 5   fluticasone-salmeterol (WIXELA INHUB) 500-50 MCG/ACT AEPB Inhale 1 puff into the lungs in the morning and at bedtime. 60 each 6   lamoTRIgine (LAMICTAL) 200 MG tablet Take 1 tablet (200 mg total) by mouth 2 (two) times daily. 180 tablet 3   lamoTRIgine (LAMICTAL) 25 MG tablet Take 2 tablets (50 mg total) by mouth at bedtime. 180 tablet 3   montelukast (SINGULAIR) 10 MG tablet Take 10 mg by mouth daily.     Oxcarbazepine (TRILEPTAL) 300 MG tablet Take 1 tablet (300 mg total) by mouth 2 (two) times daily. 180 tablet 3   pantoprazole (PROTONIX) 40 MG tablet Take 1 tablet by mouth daily.     Spacer/Aero-Holding Chambers DEVI 2 Inhalers by Does not apply route in the morning and at bedtime. 1 each 1   No current facility-administered medications for this visit.    Allergies:   Zonisamide, Aspirin, Aspirin, Ciprofloxacin, Ciprofloxacin, Codeine, Hydrocodone, Oxycodone, Penicillins, and Penicillins   Social History:  The patient  reports that she has been smoking cigarettes. She started smoking about 46 years ago. She has a 22.50 pack-year smoking history. She has been exposed to tobacco smoke. She has never used smokeless tobacco. She reports that she does not use drugs.   Family History:  The patient's family history includes Allergic rhinitis in her brother, father,  mother, sister, and sister; Alzheimer's disease in her mother; Colon cancer in her paternal aunt; Colon polyps in her brother, mother, sister, and sister; Coronary artery disease in her maternal grandfather; Diabetes in her sister; Hypertension in her brother, father, and mother; Kidney failure in her sister; Leukemia in her father; Parkinson's disease in her maternal grandmother; Prostate cancer in her father; Sleep apnea in her brother;  Stroke in her brother and father.  ROS:  Please see the history of present illness.    All other systems are reviewed and otherwise negative.   PHYSICAL EXAM:  VS:  LMP  (LMP Unknown)  BMI: There is no height or weight on file to calculate BMI. Well nourished, well developed, in no acute distress HEENT: normocephalic, atraumatic Neck: no JVD, carotid bruits or masses Cardiac:   RRR; no significant murmurs, no rubs, or gallops Lungs:   CTA b/l, no wheezing, rhonchi or rales Abd: soft, nontender MS: no deformity or atrophy Ext:   no edema Skin: warm and dry, no rash Neuro:  No gross deficits appreciated Psych: euthymic mood, full affect   PPM site is stable, no tethering or discomfort  EKG:  not done today  Device interrogation done today and reviewed by myself:  Battery and lead measurements are good No arrhythmias She is dependent at 40bpm    09/18/20: TTE IMPRESSIONS   1. Left ventricular ejection fraction, by estimation, is 55%. The left  ventricle has no regional wall motion abnormalities. Left ventricular  diastolic parameters are consistent with Grade I diastolic dysfunction  (impaired relaxation).   2. Right ventricular systolic function is normal. The right ventricular  size is normal. Peak RV-RA gradient 20 mmHg.   3. Left atrial size was mildly dilated.   4. The mitral valve is normal in structure. No evidence of mitral valve  regurgitation. No evidence of mitral stenosis.   5. The IVC was not well-visualized.   6. Echo is limited.     Transthoracic Echocardiogram: Date: 06/24/20 Results:  1. Left ventricular ejection fraction, by estimation, is 40 to 45%. The  left ventricle has mildly decreased function. The left ventricle  demonstrates global hypokinesis. There is mild concentric left ventricular  hypertrophy. Left ventricular diastolic  parameters are consistent with Grade II diastolic dysfunction  (pseudonormalization).   2. Right ventricular systolic function is normal. The right ventricular  size is normal. There is moderately elevated pulmonary artery systolic  pressure. The estimated right ventricular systolic pressure is 54.0 mmHg.   3. A small pericardial effusion is present.   4. The mitral valve is normal in structure. Trivial mitral valve  regurgitation.   5. Tricuspid valve regurgitation is mild to moderate.   6. The aortic valve is tricuspid. Aortic valve regurgitation is not  visualized. No aortic stenosis is present.   7. The inferior vena cava is dilated in size with >50% respiratory  variability, suggesting right atrial pressure of 8 mmHg.    12/14/2014: TTE Study Conclusions  - Left ventricle: The cavity size was normal. Wall thickness was    increased in a pattern of mild LVH. Systolic function was normal.    The estimated ejection fraction was in the range of 55% to 60%.    Wall motion was normal; there were no regional wall motion    abnormalities. Doppler parameters are consistent with abnormal    left ventricular relaxation (grade 1 diastolic dysfunction).  - Pericardium, extracardiac: A trivial pericardial effusion was    identified. Features were not consistent with tamponade    physiology.    10/23/2014: lexiscan stress test Low risk pharmacological stress nuclear study with LBBB-related fixed perfusion artifact, otherwise normal perfusion and normal left ventricular regional and global systolic function.  Recent Labs: 05/20/2021: ALT 12; BUN 9; Creatinine, Ser 1.27; Hemoglobin  13.3; Platelets 239; Potassium 4.5; Sodium 145  No results found for requested labs within last 365  days.   CrCl cannot be calculated (Patient's most recent lab result is older than the maximum 21 days allowed.).   Wt Readings from Last 3 Encounters:  12/31/21 180 lb (81.6 kg)  09/23/21 188 lb (85.3 kg)  06/25/21 189 lb (85.7 kg)     Other studies reviewed: Additional studies/records reviewed today include: summarized above  ASSESSMENT AND PLAN:  1. PPM     Intact function     No programming changes made  2. NICM     suspect stress induced     LVEF has had nice recovery     No symptoms to suggest changes, though given RV pacing will repeat checo this year     For now will hold off on ARB ? If it had anything to do with her cough   3. HTN     No changes today  4. Dizziness No arrhythmias, no Afib She has an appt with her neurologist next month, I have advised she reach out to them to discuss her dizziness      Disposition: we can see her again in a year, pending her echo   Current medicines are reviewed at length with the patient today.  The patient did not have any concerns regarding medicines.  Norma Fredrickson, PA-C 03/08/2022 7:09 PM     [facility] [address] [place] [address] [phone] (office)  [phone] (fax)

## 2022-03-10 ENCOUNTER — Ambulatory Visit: Payer: Medicare HMO | Attending: Physician Assistant | Admitting: Physician Assistant

## 2022-03-10 ENCOUNTER — Encounter: Payer: Self-pay | Admitting: Physician Assistant

## 2022-03-10 VITALS — BP 142/74 | HR 70 | Ht 61.5 in | Wt 181.0 lb

## 2022-03-10 DIAGNOSIS — Z950 Presence of cardiac pacemaker: Secondary | ICD-10-CM | POA: Diagnosis not present

## 2022-03-10 DIAGNOSIS — R42 Dizziness and giddiness: Secondary | ICD-10-CM | POA: Diagnosis not present

## 2022-03-10 DIAGNOSIS — I10 Essential (primary) hypertension: Secondary | ICD-10-CM

## 2022-03-10 DIAGNOSIS — I429 Cardiomyopathy, unspecified: Secondary | ICD-10-CM

## 2022-03-10 LAB — CUP PACEART INCLINIC DEVICE CHECK
Battery Remaining Longevity: 45 mo
Battery Voltage: 2.97 V
Brady Statistic AP VP Percent: 41.45 %
Brady Statistic AP VS Percent: 0 %
Brady Statistic AS VP Percent: 58.52 %
Brady Statistic AS VS Percent: 0.04 %
Brady Statistic RA Percent Paced: 41.34 %
Brady Statistic RV Percent Paced: 99.8 %
Date Time Interrogation Session: 20231003135749
Implantable Lead Implant Date: 20160707
Implantable Lead Implant Date: 20160707
Implantable Lead Location: 753859
Implantable Lead Location: 753860
Implantable Lead Model: 5076
Implantable Lead Model: 5076
Implantable Pulse Generator Implant Date: 20160707
Lead Channel Impedance Value: 323 Ohm
Lead Channel Impedance Value: 361 Ohm
Lead Channel Impedance Value: 741 Ohm
Lead Channel Impedance Value: 779 Ohm
Lead Channel Pacing Threshold Amplitude: 0.5 V
Lead Channel Pacing Threshold Amplitude: 0.5 V
Lead Channel Pacing Threshold Pulse Width: 0.4 ms
Lead Channel Pacing Threshold Pulse Width: 0.4 ms
Lead Channel Sensing Intrinsic Amplitude: 25.875 mV
Lead Channel Sensing Intrinsic Amplitude: 25.875 mV
Lead Channel Sensing Intrinsic Amplitude: 3 mV
Lead Channel Sensing Intrinsic Amplitude: 3 mV
Lead Channel Setting Pacing Amplitude: 1.5 V
Lead Channel Setting Pacing Amplitude: 2.5 V
Lead Channel Setting Pacing Pulse Width: 0.4 ms
Lead Channel Setting Sensing Sensitivity: 1.2 mV

## 2022-03-10 NOTE — Patient Instructions (Signed)
Medication Instructions:   Your physician recommends that you continue on your current medications as directed. Please refer to the Current Medication list given to you today.  *If you need a refill on your cardiac medications before your next appointment, please call your pharmacy*   Lab Work: [place]    If you have labs (blood work) drawn today and your tests are completely normal, you will receive your results only by: [place] (if you have MyChart) OR A paper copy in the mail If you have any lab test that is abnormal or we need to change your treatment, we will call you to review the results.   Testing/Procedures: Your physician has requested that you have an echocardiogram. Echocardiography is a painless test that uses sound waves to create images of your heart. It provides your doctor with information about the size and shape of your heart and how well your heart's chambers and valves are working. This procedure takes approximately one hour. There are no restrictions for this procedure.    Follow-Up: At [facility], you and your health needs are our priority.  As part of our continuing mission to provide you with exceptional heart care, we have created designated Provider Care Teams.  These Care Teams include your primary Cardiologist (physician) and Advanced Practice Providers (APPs -  Physician Assistants and Nurse Practitioners) who all work together to provide you with the care you need, when you need it.  We recommend signing up for the patient portal called "MyChart".  Sign up information is provided on this After Visit Summary.  MyChart is used to connect with patients for Virtual Visits (Telemedicine).  Patients are able to view lab/test results, encounter notes, upcoming appointments, etc.  Non-urgent messages can be sent to your provider as well.   To learn more about what you can do with MyChart, go to [email or web address].    Your next  appointment:   1 year(s)  The format for your next appointment:   In Person  Provider:   You may see Dr. Caryl Comes ONLY    Other Instructions   Important Information About Sugar

## 2022-03-12 DIAGNOSIS — Z6833 Body mass index (BMI) 33.0-33.9, adult: Secondary | ICD-10-CM | POA: Diagnosis not present

## 2022-03-12 DIAGNOSIS — R42 Dizziness and giddiness: Secondary | ICD-10-CM | POA: Diagnosis not present

## 2022-03-20 ENCOUNTER — Ambulatory Visit
Admission: RE | Admit: 2022-03-20 | Discharge: 2022-03-20 | Disposition: A | Discharge status: Home / Self Care | Payer: Medicare HMO | Source: Ambulatory Visit | Attending: Family Medicine | Admitting: Family Medicine

## 2022-03-20 DIAGNOSIS — Z1231 Encounter for screening mammogram for malignant neoplasm of breast: Secondary | ICD-10-CM

## 2022-03-26 ENCOUNTER — Other Ambulatory Visit (HOSPITAL_COMMUNITY): Payer: Medicare HMO

## 2022-04-02 ENCOUNTER — Ambulatory Visit (INDEPENDENT_AMBULATORY_CARE_PROVIDER_SITE_OTHER): Payer: Medicare HMO

## 2022-04-02 ENCOUNTER — Ambulatory Visit (HOSPITAL_COMMUNITY): Payer: Medicare HMO | Attending: Physician Assistant

## 2022-04-02 DIAGNOSIS — I429 Cardiomyopathy, unspecified: Secondary | ICD-10-CM

## 2022-04-02 DIAGNOSIS — I447 Left bundle-branch block, unspecified: Secondary | ICD-10-CM | POA: Insufficient documentation

## 2022-04-02 DIAGNOSIS — G4730 Sleep apnea, unspecified: Secondary | ICD-10-CM | POA: Diagnosis not present

## 2022-04-02 DIAGNOSIS — I10 Essential (primary) hypertension: Secondary | ICD-10-CM | POA: Diagnosis not present

## 2022-04-02 DIAGNOSIS — Z8249 Family history of ischemic heart disease and other diseases of the circulatory system: Secondary | ICD-10-CM | POA: Insufficient documentation

## 2022-04-02 DIAGNOSIS — Z950 Presence of cardiac pacemaker: Secondary | ICD-10-CM | POA: Diagnosis not present

## 2022-04-02 DIAGNOSIS — F17200 Nicotine dependence, unspecified, uncomplicated: Secondary | ICD-10-CM | POA: Diagnosis not present

## 2022-04-02 LAB — ECHOCARDIOGRAM COMPLETE
Area-P 1/2: 2.86 cm2
S' Lateral: 2.7 cm

## 2022-04-03 LAB — CUP PACEART REMOTE DEVICE CHECK
Battery Remaining Longevity: 41 mo
Battery Voltage: 2.97 V
Brady Statistic AP VP Percent: 41.7 %
Brady Statistic AP VS Percent: 0 %
Brady Statistic AS VP Percent: 57.89 %
Brady Statistic AS VS Percent: 0.41 %
Brady Statistic RA Percent Paced: 41.26 %
Brady Statistic RV Percent Paced: 98.95 %
Date Time Interrogation Session: 20231026154241
Implantable Lead Connection Status: 753985
Implantable Lead Connection Status: 753985
Implantable Lead Implant Date: 20160707
Implantable Lead Implant Date: 20160707
Implantable Lead Location: 753859
Implantable Lead Location: 753860
Implantable Lead Model: 5076
Implantable Lead Model: 5076
Implantable Pulse Generator Implant Date: 20160707
Lead Channel Impedance Value: 361 Ohm
Lead Channel Impedance Value: 380 Ohm
Lead Channel Impedance Value: 665 Ohm
Lead Channel Impedance Value: 684 Ohm
Lead Channel Pacing Threshold Amplitude: 0.5 V
Lead Channel Pacing Threshold Amplitude: 0.5 V
Lead Channel Pacing Threshold Pulse Width: 0.4 ms
Lead Channel Pacing Threshold Pulse Width: 0.4 ms
Lead Channel Sensing Intrinsic Amplitude: 2.75 mV
Lead Channel Sensing Intrinsic Amplitude: 2.75 mV
Lead Channel Sensing Intrinsic Amplitude: 27.5 mV
Lead Channel Sensing Intrinsic Amplitude: 27.5 mV
Lead Channel Setting Pacing Amplitude: 1.5 V
Lead Channel Setting Pacing Amplitude: 2.5 V
Lead Channel Setting Pacing Pulse Width: 0.4 ms
Lead Channel Setting Sensing Sensitivity: 1.2 mV
Zone Setting Status: 755011
Zone Setting Status: 755011

## 2022-04-07 ENCOUNTER — Ambulatory Visit: Payer: Medicare HMO | Admitting: Gastroenterology

## 2022-04-07 ENCOUNTER — Encounter: Payer: Self-pay | Admitting: Gastroenterology

## 2022-04-07 VITALS — BP 150/82 | HR 68 | Ht 60.75 in | Wt 184.0 lb

## 2022-04-07 DIAGNOSIS — K635 Polyp of colon: Secondary | ICD-10-CM | POA: Diagnosis not present

## 2022-04-07 DIAGNOSIS — Z86010 Personal history of colonic polyps: Secondary | ICD-10-CM

## 2022-04-07 DIAGNOSIS — Z860101 Personal history of adenomatous and serrated colon polyps: Secondary | ICD-10-CM

## 2022-04-07 MED ORDER — NA SULFATE-K SULFATE-MG SULF 17.5-3.13-1.6 GM/177ML PO SOLN: 1.0000 | ORAL | 0 refills | Status: DC

## 2022-04-07 NOTE — Progress Notes (Signed)
[place] VISIT   Primary Care Provider Rankins, Bill Salinas, MD [place] [address] [phone]   Patient Profile: [name] [name] is a 61 y.o. female with a pmh significant for seizure disorder, insomnia, arthritis, gout, OSA, status post pacemaker, GERD, hiatal hernia, colon polyps (TAs).  The patient presents to the [facility] for an evaluation and management of problem(s) noted below:  Problem List 1. Cecal polyp   2. Hx of adenomatous colonic polyps     I connected with  [name] [name] on 04/10/22. I verified that I was speaking with the correct person using two identifiers. Due to the COVID-19 Pandemic, this service was provided via telemedicine using Audio/Visual Media. The patient was located at home. The provider was located in the office. The patient did consent to this visit and is aware of charges through their insurance as well as the limitations of evaluation and management by telemedicine. The patient was referred by Dr. Silverio Decamp. Other persons participating in this telemedicine service were the patient's sister. Time spent on visit was greater than 25 minutes on phone and review of patient's history and colonoscopy reports and patient coordination of care.  History of Present Illness Please see prior notes for full details of HPI.  Interval History The patient returns for follow-up.  Patient has not had any GI issues since her last colonoscopy.  She is overdue for her follow-up based on the previous endoscopic morcellation resection that we is required for recurrent adenoma in that area near the appendiceal orifice.  She is ready to move forward with scheduling this in the coming months.  No alteration of her bowel habits.  No blood in her stools has been noted.  GI Review of Systems Positive as above Negative for odynophagia, dysphagia, nausea, vomiting, pain, melena, hematochezia    Review of Systems General: Denies fevers/chills/weight loss unintentionally Cardiovascular: Denies chest pain Pulmonary: Denies shortness of breath Gastroenterological: See HPI Genitourinary: Denies darkened urine Hematological: Denies easy bruising/bleeding Dermatological: Denies jaundice Psychological: Mood is stable   Medications Current Outpatient Medications  Medication Sig Dispense Refill   acetaminophen (TYLENOL) 500 MG tablet Take 500-1,000 mg by mouth every 6 (six) hours as needed for moderate pain (pain).      albuterol (VENTOLIN HFA) 108 (90 Base) MCG/ACT inhaler Inhale 2 puffs into the lungs every 6 (six) hours as needed for wheezing or shortness of breath. 8 g 2   atorvastatin (LIPITOR) 20 MG tablet Take 20 mg by mouth daily.     benzonatate (TESSALON) 200 MG capsule Take 1 capsule (200 mg total) by mouth 2 (two) times daily as needed for cough. 60 capsule 3   busPIRone (BUSPAR) 5 MG tablet Take 5 mg by mouth 2 (two) times daily.     carvedilol (COREG) 3.125 MG tablet Take 3.125 mg by mouth 2 (two) times daily.     Cholecalciferol (VITAMIN D3) 2000 units capsule Take 1 capsule (2,000 Units total) by mouth daily. 90 capsule 0   escitalopram (LEXAPRO) 20 MG tablet Take 1 tablet (20 mg total) by mouth daily. 30 tablet 0   fluticasone (FLONASE) 50 MCG/ACT nasal spray Place 1 spray into both nostrils 2 (two) times daily as needed (nasal congestion). 16 g 5   fluticasone-salmeterol (WIXELA INHUB) 500-50 MCG/ACT AEPB Inhale 1 puff into the lungs in the morning and at bedtime. 60 each 6   lamoTRIgine (LAMICTAL) 200 MG tablet Take 1 tablet (200 mg total) by mouth  2 (two) times daily. 180 tablet 3   lamoTRIgine (LAMICTAL) 25 MG tablet Take 2 tablets (50 mg total) by mouth at bedtime. 180 tablet 3   montelukast (SINGULAIR) 10 MG tablet Take 10 mg by mouth daily.     Na Sulfate-K Sulfate-Mg Sulf (SUPREP BOWEL PREP KIT) 17.5-3.13-1.6 GM/177ML SOLN Take 1 kit by mouth as directed. For  colonoscopy prep 354 mL 0   Oxcarbazepine (TRILEPTAL) 300 MG tablet Take 1 tablet (300 mg total) by mouth 2 (two) times daily. 180 tablet 3   pantoprazole (PROTONIX) 40 MG tablet Take 1 tablet by mouth daily.     Spacer/Aero-Holding Chambers DEVI 2 Inhalers by Does not apply route in the morning and at bedtime. 1 each 1   valACYclovir (VALTREX) 1000 MG tablet Take 1,000 mg by mouth 2 (two) times daily.     No current facility-administered medications for this visit.    Allergies Allergies  Allergen Reactions   Zonisamide Anxiety    Panic Attacks   Aspirin     sensitivity   Aspirin     UNK reaction   Ciprofloxacin     Seizures & it keeps her awake   Ciprofloxacin     UNK reaction   Codeine Hives   Hydrocodone Hives   Oxycodone Hives   Penicillins Hives and Swelling    Did it involve swelling of the face/tongue/throat, SOB, or low BP? Yes Did it involve sudden or severe rash/hives, skin peeling, or any reaction on the inside of your mouth or nose? Yes Did you need to seek medical attention at a hospital or doctor's office? No When did it last happen? 1990's   If all above answers are "NO", may proceed with cephalosporin use.    Penicillins Hives and Swelling    Pt tolerated ampicillin/sulbactam without issues 06/2020    Histories Past Medical History:  Diagnosis Date   Arthritis    Chronic insomnia 03/07/2015   Diverticulitis    GERD (gastroesophageal reflux disease)    Gout    Hiatal hernia    LBBB (left bundle branch block)    Pacemaker    Medtronic    Panic disorder 09/07/2014   Pneumonia    x 1 - Walking   Seizures ([place])    Last Seizure in 2017 - controlled with meds    Sleep apnea    does not use a CPAP   Wrist tendonitis    R    Past Surgical History:  Procedure Laterality Date   COLONOSCOPY  last 03/31/2016   COLONOSCOPY WITH PROPOFOL N/A 04/04/2019   Procedure: COLONOSCOPY WITH PROPOFOL;  Surgeon: Mauri Pole, MD;  Location: [facility];   Service: Endoscopy;  Laterality: N/A;   COLONOSCOPY WITH PROPOFOL N/A 06/15/2019   Procedure: COLONOSCOPY WITH PROPOFOL;  Surgeon: Rush Landmark Telford Nab., MD;  Location: [place];  Service: Gastroenterology;  Laterality: N/A;   COLONOSCOPY WITH PROPOFOL N/A 02/28/2020   Procedure: COLONOSCOPY WITH PROPOFOL;  Surgeon: Rush Landmark Telford Nab., MD;  Location: [facility];  Service: Gastroenterology;  Laterality: N/A;   ENDOROTOR  02/28/2020   Procedure: [id number];  Surgeon: Mansouraty, Telford Nab., MD;  Location: [facility];  Service: Gastroenterology;;   ENDOSCOPIC MUCOSAL RESECTION N/A 06/15/2019   Procedure: ENDOSCOPIC MUCOSAL RESECTION;  Surgeon: Irving Copas., MD;  Location: [place];  Service: Gastroenterology;  Laterality: N/A;   ENDOSCOPIC MUCOSAL RESECTION N/A 02/28/2020   Procedure: ENDOSCOPIC MUCOSAL RESECTION;  Surgeon: Rush Landmark Telford Nab., MD;  Location: [facility];  Service: Gastroenterology;  Laterality: N/A;   EP IMPLANTABLE DEVICE N/A 12/13/2014   Procedure: Pacemaker Implant;  Surgeon: Deboraha Sprang, MD;  Location: [place];  Service: Cardiovascular;  Laterality: N/A;   HAND SURGERY     HEMOSTASIS CLIP PLACEMENT  06/15/2019   Procedure: HEMOSTASIS CLIP PLACEMENT;  Surgeon: Irving Copas., MD;  Location: [place];  Service: Gastroenterology;;   HEMOSTASIS CLIP PLACEMENT  02/28/2020   Procedure: HEMOSTASIS CLIP PLACEMENT;  Surgeon: Irving Copas., MD;  Location: [facility];  Service: Gastroenterology;;   HEMOSTASIS CONTROL  02/28/2020   Procedure: HEMOSTASIS CONTROL;  Surgeon: Irving Copas., MD;  Location: [facility];  Service: Gastroenterology;;   POLYPECTOMY     POLYPECTOMY  04/04/2019   Procedure: POLYPECTOMY;  Surgeon: Mauri Pole, MD;  Location: [facility];  Service: Endoscopy;;   POLYPECTOMY  06/15/2019   Procedure: POLYPECTOMY;  Surgeon: Irving Copas., MD;  Location: [place];  Service: Gastroenterology;;   SKIN CANCER EXCISION     SUBMUCOSAL LIFTING INJECTION  06/15/2019   Procedure: SUBMUCOSAL LIFTING INJECTION;  Surgeon: Irving Copas., MD;  [facility];  Service: Gastroenterology;;   TUBAL LIGATION     UPPER GI ENDOSCOPY     WISDOM TOOTH EXTRACTION     Social History   Socioeconomic History   Marital status: Single    Spouse name: Not on file   Number of children: 0   Years of education: 14   Highest education level: Not on file  Occupational History   Not on file  Tobacco Use   Smoking status: Some Days    Packs/day: 0.50    Years: 45.00    Total pack years: 22.50    Types: Cigarettes    Start date: 31    Passive exposure: Current   Smokeless tobacco: Never   Tobacco comments:    Pt states she smokes 2 cigs a day and sometime 4 a week  Vaping Use   Vaping Use: Never used  Substance and Sexual Activity   Alcohol use: Not on file    Comment: Quit 12/06/2009   Drug use: Never   Sexual activity: Not on file  Other Topics Concern   Not on file  Social History Narrative   Lives with sister   Left handed   Drinks 1-2 cups caffeine daily   Social Determinants of Health   Financial Resource Strain: Not on file  Food Insecurity: Not on file  Transportation Needs: Not on file  Physical Activity: Not on file  Stress: Not on file  Social Connections: Not on file  Intimate Partner Violence: Not on file   Family History  Problem Relation Age of Onset   Allergic rhinitis Mother    Alzheimer's disease Mother    Hypertension Mother    Colon polyps Mother    Allergic rhinitis Father    Hypertension Father    Stroke Father    Leukemia Father    Prostate cancer Father    Colon polyps Father    Allergic rhinitis Sister    Colon polyps Sister    Allergic rhinitis Sister    Kidney failure Sister    Diabetes Sister    Colon polyps Sister    Allergic rhinitis Brother    Stroke Brother    Hypertension  Brother    Sleep apnea Brother    Colon polyps Brother    Parkinson's disease Maternal Grandmother    Coronary artery disease Maternal Grandfather    Colon  cancer Paternal Aunt    Colon cancer Paternal Uncle    Esophageal cancer Neg Hx    Stomach cancer Neg Hx    Rectal cancer Neg Hx    Inflammatory bowel disease Neg Hx    Liver disease Neg Hx    Pancreatic cancer Neg Hx    I have reviewed her medical, social, and family history in detail and updated the electronic medical record as necessary.    PHYSICAL EXAMINATION  BP (!) 150/82 (BP Location: Right Arm, Patient Position: Sitting, Cuff Size: Normal)   Pulse 68   Ht 5' 0.75" (1.543 m) Comment: height measured without shoes  Wt 184 lb (83.5 kg)   LMP  (LMP Unknown)   BMI 35.05 kg/m  Wt Readings from Last 3 Encounters:  04/07/22 184 lb (83.5 kg)  03/10/22 181 lb (82.1 kg)  12/31/21 180 lb (81.6 kg)  GEN: NAD, appears stated age, doesn't appear chronically ill PSYCH: Cooperative, without pressured speech EYE: Conjunctivae pink, sclerae anicteric ENT: MMM CV: Nontachycardic RESP: No audible wheezing GI: NABS, soft, protuberant abdomen, nontender MSK/EXT: No significant lower extremity edema SKIN: No jaundice NEURO:  Alert & Oriented x 3, no focal deficits   REVIEW OF DATA  I reviewed the following data at the time of this encounter:  GI Procedures and Studies  September 2021 colonoscopy - Hemorrhoids found on digital exam. - The examined portion of the ileum was normal. - Post mucosectomy scar in the cecum extending to the near appendiceal orifice. Polypoid tissue present concerning for recurrence in middle of scar. Endorotor Powered Endoscopic Resection via morcellation performed after lift. Clips (MR conditional) were placed to close defect after completion. - Two 2 to 4 mm polyps in the proximal descending colon, removed with a cold snare.Resected and retrieved. - Many small polyps in the rectum, at the recto-sigmoid  colon and in the sigmoid colon. Resection not attempted - consistent with prior hyperplastic polyps. - Diverticulosis in the sigmoid colon. - Normal mucosa in the entire examined colon otherwise. - Non-bleeding non-thrombosed internal hemorrhoids.  Pathology FINAL MICROSCOPIC DIAGNOSIS:  A. COLON, CECAL POLYP EMR, ENDOROTOR, POLYPECTOMY:  - Tubular adenoma.  - No high grade dysplasia or malignancy. B. COLON, DESCENDING, POLYPECTOMY:  - Tubular adenoma.  - No high grade dysplasia or malignancy.   Laboratory Studies  Reviewed those in epic  Imaging Studies  No relevant studies to review   ASSESSMENT  Ms. Mikkelsen is a 61 y.o. female with a pmh significant for seizure disorder, insomnia, arthritis, gout, OSA, status post pacemaker, GERD, hiatal hernia, colon polyps (TAs).  The patient is seen today for evaluation and management of:  1. Cecal polyp   2. Hx of adenomatous colonic polyps    The patient is clinically and hemodynamically stable at this time.  She is overdue for follow-up surveillance colonoscopy in the setting of her previously noted tubular adenoma of the cecum/appendiceal orifice/periappendiceal orifice region.  She did unfortunately have a recurrence after initial EMR which we then treated with Endorotor power morcellation.  Hopefully she has no recurrence.  If she does we will have to work on it again.  The risks and benefits of endoscopic evaluation were discussed with the patient; these include but are not limited to the risk of perforation, infection, bleeding, missed lesions, lack of diagnosis, severe illness requiring hospitalization, as well as anesthesia and sedation related illnesses.  The patient and/or family is agreeable to proceed.  All patient questions were answered to the best of  my ability, and the patient agrees to the aforementioned plan of action with follow-up as indicated.   PLAN  Proceed with scheduling colonoscopy with EMR slot Follow-up to be  dictated by results of colonoscopy   Orders Placed This Encounter  Procedures   Procedural/ Surgical Case Request: COLONOSCOPY WITH PROPOFOL, ENDOSCOPIC MUCOSAL RESECTION   Ambulatory referral to Gastroenterology    New Prescriptions   NA SULFATE-K SULFATE-MG SULF (SUPREP BOWEL PREP KIT) 17.5-3.13-1.6 GM/177ML SOLN    Take 1 kit by mouth as directed. For colonoscopy prep   Modified Medications   No medications on file    Planned Follow Up No follow-ups on file.   Total Time in Face-to-Face and in Coordination of Care for patient including independent/personal interpretation/review of prior testing, medical history, examination, medication adjustment, communicating results with the patient directly, and documentation within the EHR is 20 minutes.   Justice Britain, MD [facility] Office # [phone]

## 2022-04-07 NOTE — Patient Instructions (Signed)
You have been scheduled for a colonoscopy. Please follow written instructions given to you at your visit today.  Please pick up your prep supplies at the pharmacy within the next 1-3 days. If you use inhalers (even only as needed), please bring them with you on the day of your procedure.  We have sent the following medications to your pharmacy for you to pick up at your convenience: Suprep   _______________________________________________________  If you are age 62 or older, your body mass index should be between 23-30. Your Body mass index is 35.05 kg/m. If this is out of the aforementioned range listed, please consider follow up with your Primary Care Provider.  If you are age 104 or younger, your body mass index should be between 19-25. Your Body mass index is 35.05 kg/m. If this is out of the aformentioned range listed, please consider follow up with your Primary Care Provider.   ________________________________________________________  The  GI providers would like to encourage you to use [facility] to communicate with providers for non-urgent requests or questions.  Due to long hold times on the telephone, sending your provider a message by [facility] may be a faster and more efficient way to get a response.  Please allow 48 business hours for a response.  Please remember that this is for non-urgent requests.  _______________________________________________________  Thank you for choosing me and [facility].  Dr. Rush Landmark

## 2022-04-09 NOTE — Progress Notes (Signed)
Remote pacemaker transmission.   

## 2022-04-10 ENCOUNTER — Other Ambulatory Visit: Payer: Self-pay | Admitting: *Deleted

## 2022-04-10 ENCOUNTER — Telehealth: Payer: Self-pay | Admitting: *Deleted

## 2022-04-10 ENCOUNTER — Encounter: Payer: Self-pay | Admitting: Gastroenterology

## 2022-04-10 DIAGNOSIS — Z23 Encounter for immunization: Secondary | ICD-10-CM | POA: Diagnosis not present

## 2022-04-10 DIAGNOSIS — K635 Polyp of colon: Secondary | ICD-10-CM | POA: Insufficient documentation

## 2022-04-10 MED ORDER — LOSARTAN POTASSIUM 25 MG PO TABS: 25.0000 mg | ORAL_TABLET | Freq: Every day | ORAL | 1 refills | Status: DC

## 2022-04-10 NOTE — Telephone Encounter (Signed)
-----   Message from [place], [place] sent at 04/07/2022  7:04 AM EDT ----- Echo look much the same as it did back in jan 2022, but lower then her last.  Lets start losartan back '25mg'$  daily.  F/u with SK/me in 3-4 mo Let us know if she gets cough with the losartan please

## 2022-04-14 ENCOUNTER — Telehealth: Payer: Self-pay | Admitting: Internal Medicine

## 2022-04-14 NOTE — Telephone Encounter (Signed)
Pt c/o medication issue:  1. Name of Medication:  losartan (COZAAR) 25 MG tablet  2. How are you currently taking this medication (dosage and times per day)?  As prescribed  3. Are you having a reaction (difficulty breathing--STAT)?   4. What is your medication issue?   Patient states this medication is ineffective and BP is more elevated. She also mentioned she has a headache every day.  11/04: 155/59            160/75  11/05: 175/81            184/79  11/06: 164/77 11/07: 187/85

## 2022-04-14 NOTE — Telephone Encounter (Signed)
Spoke with pt who reports elevated BP as below.  Pt states she usually checks her BP only once weekly but has been checking daily since starting back on Losartan for decreased EF.  Pt has been checking BP in the mornings before taking Losartan.  She also reports she is stressed due to her sister moving in with her.  She took Tylenol one day which did relieve her headache.   Pt advised best time for monitoring BP is 2 hours after she has taken her Losartan and Carvedilol and keep a log.  Discussed decreasing stress and monitoring sodium intake.  Requested pt recheck her BP from this morning as soon as convenient.  Recommended pt continue to monitor BP for the next 3 days and send BP readings on Friday.  Reviewed ED precautions.  Pt verbalizes understanding and agrees with current plan.

## 2022-04-27 ENCOUNTER — Telehealth: Payer: Self-pay | Admitting: Physician Assistant

## 2022-04-27 NOTE — Telephone Encounter (Signed)
Patient is calling in BP readings:  11/16  AM 187/82  PM 194/83 11/17 AM 161/63  PM 192/82 11/18 AM 169/70  PM 201/92 11/19  AM 190/84  PM 192/99 11/20  AM 192/90

## 2022-04-27 NOTE — Telephone Encounter (Signed)
Spoke with pt and discussed elevated BP readings as below.  Pt denies current CP, SOB or dizziness.  She does report almost daily headache that resolves with Tylenol.   Pt sees EP but does not have a general cardiologist.  Recommended pt contact her PCP or allow RN to schedule with general cardiology.  Pt states she would like to confirm her appointment with her PCP and will send a MyChart message this afternoon as to how she would like to follow up given her BP.   Pt verbalizes understanding and agrees with current plan.

## 2022-04-27 NOTE — Telephone Encounter (Signed)
Spoke with pt who states her PCP visit is scheduled for 05/04/2022.  She would like to schedule with general cardiology to help manage her hypertension.  Appointment scheduled with Richardson Dopp on 04/29/2022.  Reviewed ED precautions.  Pt verbalizes understanding and agrees with current plan.

## 2022-04-28 NOTE — Progress Notes (Unsigned)
Cardiology Clinic Note   Patient Name: [name] [name] Date of Encounter: 04/29/2022  Primary Care Provider:  Aretta Nip, MD Primary Cardiologist:  None  Patient Profile    Ms. [name] [name] is a 61 year-old female with a past medical history of LBBB, complete heart block with PPM, seizure disorder followed by neurology, OSA no CPAP, NICM, and hypertension who presents to the clinic today for evaluation of hypertension.   Past Medical History    Past Medical History:  Diagnosis Date   Arthritis    Chronic insomnia 03/07/2015   Diverticulitis    GERD (gastroesophageal reflux disease)    Gout    Hiatal hernia    LBBB (left bundle branch block)    Pacemaker    [employer]    Panic disorder 09/07/2014   Pneumonia    x 1 - Walking   Seizures ([place])    Last Seizure in 2017 - controlled with meds    Sleep apnea    does not use a CPAP   Wrist tendonitis    R    Past Surgical History:  Procedure Laterality Date   COLONOSCOPY  last 03/31/2016   COLONOSCOPY WITH PROPOFOL N/A 04/04/2019   Procedure: COLONOSCOPY WITH PROPOFOL;  Surgeon: Mauri Pole, MD;  Location: [facility];  Service: Endoscopy;  Laterality: N/A;   COLONOSCOPY WITH PROPOFOL N/A 06/15/2019   Procedure: COLONOSCOPY WITH PROPOFOL;  Surgeon: Rush Landmark Telford Nab., MD;  Location: [facility];  Service: Gastroenterology;  Laterality: N/A;   COLONOSCOPY WITH PROPOFOL N/A 02/28/2020   Procedure: COLONOSCOPY WITH PROPOFOL;  Surgeon: Rush Landmark Telford Nab., MD;  Location: [facility];  Service: Gastroenterology;  Laterality: N/A;   ENDOROTOR  02/28/2020   Procedure: PRXYVOPFY;  Surgeon: Mansouraty, Telford Nab., MD;  Location: [facility];  Service: Gastroenterology;;   ENDOSCOPIC MUCOSAL RESECTION N/A 06/15/2019   Procedure: ENDOSCOPIC MUCOSAL RESECTION;  Surgeon: Irving Copas., MD;  Location: [place];  Service: Gastroenterology;  Laterality: N/A;   ENDOSCOPIC MUCOSAL RESECTION N/A  02/28/2020   Procedure: ENDOSCOPIC MUCOSAL RESECTION;  Surgeon: Rush Landmark Telford Nab., MD;  Location: [facility];  Service: Gastroenterology;  Laterality: N/A;   EP IMPLANTABLE DEVICE N/A 12/13/2014   Procedure: Pacemaker Implant;  Surgeon: Deboraha Sprang, MD;  Location: [facility];  Service: Cardiovascular;  Laterality: N/A;   HAND SURGERY     HEMOSTASIS CLIP PLACEMENT  06/15/2019   Procedure: HEMOSTASIS CLIP PLACEMENT;  Surgeon: Irving Copas., MD;  Location: [facility];  Service: Gastroenterology;;   HEMOSTASIS CLIP PLACEMENT  02/28/2020   Procedure: HEMOSTASIS CLIP PLACEMENT;  Surgeon: Irving Copas., MD;  Location: [facility];  Service: Gastroenterology;;   HEMOSTASIS CONTROL  02/28/2020   Procedure: HEMOSTASIS CONTROL;  Surgeon: Irving Copas., MD;  Location: [facility];  Service: Gastroenterology;;   POLYPECTOMY     POLYPECTOMY  04/04/2019   Procedure: POLYPECTOMY;  Surgeon: Mauri Pole, MD;  Location: [facility];  Service: Endoscopy;;   POLYPECTOMY  06/15/2019   Procedure: POLYPECTOMY;  Surgeon: Irving Copas., MD;  Location: [facility];  Service: Gastroenterology;;   SKIN CANCER EXCISION     SUBMUCOSAL LIFTING INJECTION  06/15/2019   Procedure: SUBMUCOSAL LIFTING INJECTION;  Surgeon: Irving Copas., MD;  Location: [facility];  Service: Gastroenterology;;   TUBAL LIGATION     UPPER GI ENDOSCOPY     WISDOM TOOTH EXTRACTION      Allergies  Allergies  Allergen Reactions   Zonisamide Anxiety    Panic Attacks  Aspirin     sensitivity   Aspirin     UNK reaction   Ciprofloxacin     Seizures & it keeps her awake   Ciprofloxacin     UNK reaction   Codeine Hives   Hydrocodone Hives   Oxycodone Hives   Penicillins Hives and Swelling    Did it involve swelling of the face/tongue/throat, SOB, or low BP? Yes Did it involve sudden or severe rash/hives, skin peeling, or any reaction on the inside of your  mouth or nose? Yes Did you need to seek medical attention at a hospital or doctor's office? No When did it last happen? 1990's   If all above answers are "NO", may proceed with cephalosporin use.    Penicillins Hives and Swelling    Pt tolerated ampicillin/sulbactam without issues 06/2020    History of Present Illness    Ms. [name] [name] has a past medical history of: LBBB Complete heart block s/p PPM 12/13/2014.  Remote device check 04/02/2022: Normal function.  In-clinic device check 03/10/2022: Normal device function. Estimated longevity 3.5 years.  NICM.  Echo 06/21/2020: EF 40-45%. Mildly decreased left ventricular function. Global hypokinesis. Mild concentric LVH. Grade II DD. Moderately elevated PA systolic pressure. Small pericardial effusion. Trivial mitral valve regurgitation. Mild to moderate tricuspid valve regurgitation.  Echo (limited) 09/18/2020: EF 55%. Grade I DD. Mildly dilated left atrium.  Echo 04/02/2022: EF 45-50%. Mildly decreased left ventricular function. Grade I DD. Mildly dilated left atrium.  Hypertension.  OSA. No CPAP. Seizure disorder.  Followed by neurology.   Ms. [name] [name] has been followed by EP for LBBB and complete heart block since 2016. She was last seen in the office by Tommye Standard, PA-C on 03/10/2022 for constant dizziness. She reported a fall forward down steps two weeks prior to her visit hitting her face/cheek for which she did not receive medical attention. Her pacemaker was interrogated and function was intact with no changes made. She was instructed to follow-up with neurology for dizziness.   Today, patient is accompanied by her sister. Patient would like to get established with general cardiology for increased blood pressure over the last couple of months. Patient states she has been under an increased amount of stress secondary to her older sister moving into the home she shares with her other sister. She first noticed increased BP at a doctor's visit.  She purchased a home cuff and has been checking it at home. BP readings for the month of November >155/80 with some readings >190/80 and a couple ~200/85 1-2 hours after taking Carvedilol 3.125 mg and Losartan 25 mg. She reports associated occipital headache nearly daily that gets as bad as an 8/10 and slightly eases with Tylenol. She reports dizziness and endorses poor balance and unsteady gait secondary to epilepsy medication. She denies recent falls. She does not use an assistive device for ambulation. Sister reports she leans on walls and uses furniture for balance. She denies chest pain and shortness of breath at rest. She reports dyspnea without chest pain with heavy exertion such as blowing leaves. She is being followed by pulmonology for a chronic cough that increases her shortness of breath. No lower extremity edema. She does not exercise. She follows a low sodium diet.   Home Medications    Current Meds  Medication Sig   acetaminophen (TYLENOL) 500 MG tablet Take 500-1,000 mg by mouth every 6 (six) hours as needed for moderate pain (pain).    albuterol (VENTOLIN HFA) 108 (90 Base) MCG/ACT  inhaler Inhale 2 puffs into the lungs every 6 (six) hours as needed for wheezing or shortness of breath.   atorvastatin (LIPITOR) 20 MG tablet Take 20 mg by mouth daily.   benzonatate (TESSALON) 200 MG capsule Take 1 capsule (200 mg total) by mouth 2 (two) times daily as needed for cough.   busPIRone (BUSPAR) 5 MG tablet Take 5 mg by mouth 2 (two) times daily.   carvedilol (COREG) 3.125 MG tablet Take 3.125 mg by mouth 2 (two) times daily.   Cholecalciferol (VITAMIN D3) 2000 units capsule Take 1 capsule (2,000 Units total) by mouth daily.   escitalopram (LEXAPRO) 20 MG tablet Take 1 tablet (20 mg total) by mouth daily.   fluticasone-salmeterol (WIXELA INHUB) 500-50 MCG/ACT AEPB Inhale 1 puff into the lungs in the morning and at bedtime.   lamoTRIgine (LAMICTAL) 200 MG tablet Take 1 tablet (200 mg total) by  mouth 2 (two) times daily.   lamoTRIgine (LAMICTAL) 25 MG tablet Take 2 tablets (50 mg total) by mouth at bedtime.   losartan (COZAAR) 25 MG tablet Take 1 tablet (25 mg total) by mouth daily.   montelukast (SINGULAIR) 10 MG tablet Take 10 mg by mouth daily.   Na Sulfate-K Sulfate-Mg Sulf (SUPREP BOWEL PREP KIT) 17.5-3.13-1.6 GM/177ML SOLN Take 1 kit by mouth as directed. For colonoscopy prep   Oxcarbazepine (TRILEPTAL) 300 MG tablet Take 1 tablet (300 mg total) by mouth 2 (two) times daily.   pantoprazole (PROTONIX) 40 MG tablet Take 1 tablet by mouth daily.   Spacer/Aero-Holding Chambers DEVI 2 Inhalers by Does not apply route in the morning and at bedtime.    Family History    Family History  Problem Relation Age of Onset   Allergic rhinitis Mother    Alzheimer's disease Mother    Hypertension Mother    Colon polyps Mother    Allergic rhinitis Father    Hypertension Father    Stroke Father    Leukemia Father    Prostate cancer Father    Colon polyps Father    Allergic rhinitis Sister    Colon polyps Sister    Allergic rhinitis Sister    Kidney failure Sister    Diabetes Sister    Colon polyps Sister    Allergic rhinitis Brother    Stroke Brother    Hypertension Brother    Sleep apnea Brother    Colon polyps Brother    Parkinson's disease Maternal Grandmother    Coronary artery disease Maternal Grandfather    Colon cancer Paternal Aunt    Colon cancer Paternal Uncle    Esophageal cancer Neg Hx    Stomach cancer Neg Hx    Rectal cancer Neg Hx    Inflammatory bowel disease Neg Hx    Liver disease Neg Hx    Pancreatic cancer Neg Hx    She indicated that her mother is deceased. She indicated that her father is deceased. She indicated that both of her sisters are alive. She indicated that her brother is alive. She indicated that her maternal grandmother is deceased. She indicated that her maternal grandfather is deceased. She indicated that her paternal grandmother is  deceased. She indicated that her paternal grandfather is deceased. She indicated that the status of her paternal aunt is unknown. She indicated that her paternal uncle is deceased. She indicated that the status of her neg hx is unknown.   Social History    Social History   Socioeconomic History   Marital status: Single  Spouse name: Not on file   Number of children: 0   Years of education: 14   Highest education level: Not on file  Occupational History   Not on file  Tobacco Use   Smoking status: Some Days    Packs/day: 0.50    Years: 45.00    Total pack years: 22.50    Types: Cigarettes    Start date: 6    Passive exposure: Current   Smokeless tobacco: Never   Tobacco comments:    Pt states she smokes 2 cigs a day and sometime 4 a week  Vaping Use   Vaping Use: Never used  Substance and Sexual Activity   Alcohol use: Not on file    Comment: Quit 12/06/2009   Drug use: Never   Sexual activity: Not on file  Other Topics Concern   Not on file  Social History Narrative   Lives with sister   Left handed   Drinks 1-2 cups caffeine daily   Social Determinants of Health   Financial Resource Strain: Not on file  Food Insecurity: Not on file  Transportation Needs: Not on file  Physical Activity: Not on file  Stress: Not on file  Social Connections: Not on file  Intimate Partner Violence: Not on file     Review of Systems    General:  No chills, fever, night sweats or weight changes.  Cardiovascular:  No chest pain, edema, orthopnea, palpitations, paroxysmal nocturnal dyspnea. Dyspnea with heavy exertion. Dermatological: No rash, lesions/masses Respiratory: Positive for cough and dyspnea. Urologic: No hematuria, dysuria Abdominal:   No nausea, vomiting, diarrhea, bright red blood per rectum, melena, or hematemesis Neurologic:  No visual changes, weakness, changes in mental status. Positive for occipital headaches.  All other systems reviewed and are otherwise  negative except as noted above.  Physical Exam    VS:  BP (!) 158/78   Pulse 60   Ht 5' 0.75" (1.543 m)   Wt 184 lb (83.5 kg)   LMP  (LMP Unknown)   SpO2 95%   BMI 35.05 kg/m  , BMI Body mass index is 35.05 kg/m. GEN:  Well nourished, well developed, in no acute distress. HEENT: Normal. Neck: Supple, no JVD, carotid bruits, or masses. Cardiac: RRR, no murmurs, rubs, or gallops. No clubbing, cyanosis, edema.  Radials/DP/PT 2+ and equal bilaterally.  Respiratory:  Respirations regular and unlabored, clear to auscultation bilaterally. GI: Soft, nontender, nondistended. MS: No deformity or atrophy. Skin: Warm and dry, no rash. Neuro: Strength and sensation are intact. Psych: Normal affect.  Accessory Clinical Findings     Recent Labs: 05/20/2021: ALT 12; BUN 9; Creatinine, Ser 1.27; Hemoglobin 13.3; Platelets 239; Potassium 4.5; Sodium 145   Recent Lipid Panel    Component Value Date/Time   CHOL 257 (H) 03/02/2016 1102   TRIG 304 (H) 06/25/2020 0154   HDL 41.90 03/02/2016 1102   CHOLHDL 6 03/02/2016 1102   VLDL 41.0 (H) 03/02/2016 1102   LDLDIRECT 199.0 03/02/2016 1102    HYPERTENSION CONTROL Vitals:   04/29/22 1010 04/29/22 1130  BP: (!) 158/78 (!) 150/90    The patient's blood pressure is elevated above target today.  In order to address the patient's elevated BP: A current anti-hypertensive medication was adjusted today.    ECG personally reviewed by me today: AV paced rhythm with prolonged AV conduction, HR 60.  No significant changes from 05/29/2021.      Assessment & Plan   Hypertension. BP today 158/78 at intake and  150/90 at recheck. Home log shows BP >150/80 consistently and to 200/80 at times. Patient's PCP increased Losartan to 50 mg but the pharmacy would not fill it so she has been taking Losartan 25 mg along with carvedilol 3.125 mg. Given patient's history of poor balance and unsteadiness with gait decided not to increased carvedilol. Will stop  Losartan and start Valsartan 160 mg daily. Patient will continue to keep BP log. Return in two weeks for BMP. If BP is not well controlled at next visit may consider changing to [facility].  LBBB/Complete heart block s/p PPM. Last remote device check 04/02/2022 showed normal device function. EKG today AV paced, HR 60. Next remote device check in January 2024 and follow-up with EP in April 2024.  NICM. Echo 04/02/2022 EF 45-50% reduced from previous in April 2022. Losartan started. Patient reports dyspnea with heavy exertion but not at rest. No lower extremity edema. Continue carvedilol 3.125 mg twice a day and change Losartan to valsartan 160 mg daily. May consider entresto at next visit.   Disposition: Stop Losartan and start Valsartan 160 mg daily. Continue to log BP. BMP in two weeks. Return in 8 weeks or sooner as needed. May consider Entresto at next visit.    Justice Britain. W[place], NP-C     04/29/2022, 10:41 AM [place] [address] Office [phone] Fax [phone]   I spent 15 minutes examining this patient, reviewing medications, and using patient centered shared decision making involving her cardiac care.  Prior to her visit I spent greater than 20 minutes reviewing her past medical history,  medications, and prior cardiac tests.

## 2022-04-29 ENCOUNTER — Encounter: Payer: Self-pay | Admitting: Student

## 2022-04-29 ENCOUNTER — Ambulatory Visit: Payer: Medicare HMO | Attending: Physician Assistant | Admitting: Student

## 2022-04-29 VITALS — BP 150/90 | HR 60 | Ht 60.75 in | Wt 184.0 lb

## 2022-04-29 DIAGNOSIS — I10 Essential (primary) hypertension: Secondary | ICD-10-CM | POA: Diagnosis not present

## 2022-04-29 DIAGNOSIS — I429 Cardiomyopathy, unspecified: Secondary | ICD-10-CM

## 2022-04-29 MED ORDER — VALSARTAN 160 MG PO TABS: 160.0000 mg | ORAL_TABLET | Freq: Every day | ORAL | 0 refills | Status: DC

## 2022-04-29 NOTE — Patient Instructions (Addendum)
Medication Instructions:  Your physician has recommended you make the following change in your medication:   STOP Losartan  START Valsartan 160 mg taking 1 daily   *If you need a refill on your cardiac medications before your next appointment, please call your pharmacy*   Lab Work: 2 WEEKS:  BMET  If you have labs (blood work) drawn today and your tests are completely normal, you will receive your results only by: [place] (if you have MyChart) OR A paper copy in the mail If you have any lab test that is abnormal or we need to change your treatment, we will call you to review the results.   Testing/Procedures: None ordered   Follow-Up: At [facility], you and your health needs are our priority.  As part of our continuing mission to provide you with exceptional heart care, we have created designated Provider Care Teams.  These Care Teams include your primary Cardiologist (physician) and Advanced Practice Providers (APPs -  Physician Assistants and Nurse Practitioners) who all work together to provide you with the care you need, when you need it.  We recommend signing up for the patient portal called "MyChart".  Sign up information is provided on this After Visit Summary.  MyChart is used to connect with patients for Virtual Visits (Telemedicine).  Patients are able to view lab/test results, encounter notes, upcoming appointments, etc.  Non-urgent messages can be sent to your provider as well.   To learn more about what you can do with MyChart, go to [email or web address].    Your next appointment:   8 week(s)  The format for your next appointment:   In Person  Provider:   Richardson Dopp, PA-C         Other Instructions   Important Information About Sugar

## 2022-05-05 DIAGNOSIS — I10 Essential (primary) hypertension: Secondary | ICD-10-CM | POA: Diagnosis not present

## 2022-05-05 DIAGNOSIS — N1832 Chronic kidney disease, stage 3b: Secondary | ICD-10-CM | POA: Diagnosis not present

## 2022-05-05 DIAGNOSIS — K219 Gastro-esophageal reflux disease without esophagitis: Secondary | ICD-10-CM | POA: Diagnosis not present

## 2022-05-05 DIAGNOSIS — E782 Mixed hyperlipidemia: Secondary | ICD-10-CM | POA: Diagnosis not present

## 2022-05-06 ENCOUNTER — Telehealth: Payer: Self-pay | Admitting: Neurology

## 2022-05-06 NOTE — Telephone Encounter (Signed)
Pt is wanting to know if she can increase the dosage on her busPIRone (BUSPAR) 5 MG tablet to '10mg'$  BID Please fax to [facility] and send it attention Wonda Amis.

## 2022-05-07 ENCOUNTER — Emergency Department (HOSPITAL_COMMUNITY): Payer: Medicare HMO

## 2022-05-07 ENCOUNTER — Emergency Department (HOSPITAL_COMMUNITY)
Admission: EM | Admit: 2022-05-07 | Discharge: 2022-05-07 | Disposition: A | Discharge status: Home / Self Care | Payer: Medicare HMO | Attending: Emergency Medicine | Admitting: Emergency Medicine

## 2022-05-07 DIAGNOSIS — I959 Hypotension, unspecified: Secondary | ICD-10-CM | POA: Diagnosis not present

## 2022-05-07 DIAGNOSIS — M25512 Pain in left shoulder: Secondary | ICD-10-CM | POA: Diagnosis not present

## 2022-05-07 DIAGNOSIS — T68XXXA Hypothermia, initial encounter: Secondary | ICD-10-CM | POA: Diagnosis not present

## 2022-05-07 DIAGNOSIS — Z95810 Presence of automatic (implantable) cardiac defibrillator: Secondary | ICD-10-CM | POA: Diagnosis not present

## 2022-05-07 DIAGNOSIS — G4489 Other headache syndrome: Secondary | ICD-10-CM | POA: Diagnosis not present

## 2022-05-07 DIAGNOSIS — R519 Headache, unspecified: Secondary | ICD-10-CM

## 2022-05-07 DIAGNOSIS — Z79899 Other long term (current) drug therapy: Secondary | ICD-10-CM | POA: Diagnosis not present

## 2022-05-07 DIAGNOSIS — I10 Essential (primary) hypertension: Secondary | ICD-10-CM | POA: Diagnosis not present

## 2022-05-07 DIAGNOSIS — R079 Chest pain, unspecified: Secondary | ICD-10-CM | POA: Diagnosis not present

## 2022-05-07 DIAGNOSIS — R42 Dizziness and giddiness: Secondary | ICD-10-CM | POA: Diagnosis not present

## 2022-05-07 LAB — BASIC METABOLIC PANEL
Anion gap: 13 (ref 5–15)
BUN: 8 mg/dL (ref 8–23)
CO2: 24 mmol/L (ref 22–32)
Calcium: 9.2 mg/dL (ref 8.9–10.3)
Chloride: 103 mmol/L (ref 98–111)
Creatinine, Ser: 1.2 mg/dL — ABNORMAL HIGH (ref 0.44–1.00)
GFR, Estimated: 51 mL/min — ABNORMAL LOW (ref >60)
Glucose, Bld: 97 mg/dL (ref 70–99)
Potassium: 3.4 mmol/L — ABNORMAL LOW (ref 3.5–5.1)
Sodium: 140 mmol/L (ref 135–145)

## 2022-05-07 LAB — CBC WITH DIFFERENTIAL/PLATELET
Abs Immature Granulocytes: 0.03 10*3/uL (ref 0.00–0.07)
Basophils Absolute: 0.1 10*3/uL (ref 0.0–0.1)
Basophils Relative: 0 %
Eosinophils Absolute: 0.2 10*3/uL (ref 0.0–0.5)
Eosinophils Relative: 1 %
HCT: 40.9 % (ref 36.0–46.0)
Hemoglobin: 13.8 g/dL (ref 12.0–15.0)
Immature Granulocytes: 0 %
Lymphocytes Relative: 35 %
Lymphs Abs: 4.1 10*3/uL — ABNORMAL HIGH (ref 0.7–4.0)
MCH: 31.4 pg (ref 26.0–34.0)
MCHC: 33.7 g/dL (ref 30.0–36.0)
MCV: 93.2 fL (ref 80.0–100.0)
Monocytes Absolute: 0.4 10*3/uL (ref 0.1–1.0)
Monocytes Relative: 4 %
Neutro Abs: 7 10*3/uL (ref 1.7–7.7)
Neutrophils Relative %: 60 %
Platelets: 245 10*3/uL (ref 150–400)
RBC: 4.39 MIL/uL (ref 3.87–5.11)
RDW: 13.2 % (ref 11.5–15.5)
WBC: 11.8 10*3/uL — ABNORMAL HIGH (ref 4.0–10.5)
nRBC: 0 % (ref 0.0–0.2)

## 2022-05-07 LAB — TROPONIN I (HIGH SENSITIVITY)
Troponin I (High Sensitivity): 8 ng/L (ref <18)
Troponin I (High Sensitivity): 8 ng/L (ref <18)

## 2022-05-07 MED ORDER — ACETAMINOPHEN 325 MG PO TABS
650.0000 mg | ORAL_TABLET | Freq: Once | ORAL | Status: AC
  Administered 2022-05-07: 650 mg via ORAL
  Filled 2022-05-07: qty 2

## 2022-05-07 NOTE — ED Notes (Signed)
Patient transported to X-ray 

## 2022-05-07 NOTE — ED Notes (Signed)
Discharge instructions reviewed with pt at this time. Pt verbalizes understanding. Belongings with pt upon depart. Pt taken to family vehicle in wheelchair.

## 2022-05-07 NOTE — Telephone Encounter (Signed)
I reviewed the chart, we have not prescribed this medication before for the pt.   Last mention of a prescription being sent was in 2016 by [facility].  I advised pt to discuss this refill request with her PCP.  She is agreeable to this.

## 2022-05-07 NOTE — ED Triage Notes (Signed)
Pt BIB EMS. Per EMS, pt has been dealing with high BP poorly regulated with medication. Pt states BP has been getting significantly higher this week. Also, c/o headache and shoulder pain. Pt A/Ox4

## 2022-05-07 NOTE — ED Provider Notes (Signed)
[facility] EMERGENCY DEPARTMENT Provider Note   CSN: [id number] Arrival date & time: 05/07/22  0202     History  Chief Complaint  Patient presents with   Hypertension    [name] [name] is a 61 y.o. female.  The history is provided by the patient and medical records.  Hypertension  [name] [name] is a 61 y.o. female who presents to the Emergency Department complaining of hypertension.  She presents to the emergency department by EMS for concern of hypertension.  She has a longstanding history of hypertension and has a twice daily blood pressure log that she brought with her for the last month.  She states tonight when she checked her blood pressure the systolic was 170, which is higher than it has ever been before.  On review of systems she complains of headaches since February of this year.  They occur most days in the midday.  Headaches are sometimes occipital, right-sided and left-sided.  They last from 1 hour to several hours at a time and are throbbing in nature.  She did have a headache earlier but none currently.  She also complains of left shoulder pain that started today.  No reports of injuries.  No vision changes, chest pain, difficulty breathing, nausea, vomiting.  She did see her PCP yesterday and had her valsartan increased from 160 daily to 320 and took her dose yesterday morning around 9 AM.  She also takes carvedilol twice daily for hypertension and did receive her dose last night.  She also has a history of epilepsy and is compliant with her medications.  She does use tobacco.  No alcohol or drug use.Marland Kitchen     Home Medications Prior to Admission medications   Medication Sig Start Date End Date Taking? Authorizing Provider  acetaminophen (TYLENOL) 500 MG tablet Take 500-1,000 mg by mouth every 6 (six) hours as needed for moderate pain (pain).     [provider]  albuterol (VENTOLIN HFA) 108 (90 Base) MCG/ACT inhaler Inhale 2 puffs into the  lungs every 6 (six) hours as needed for wheezing or shortness of breath. 06/26/20   Barb Merino, MD  atorvastatin (LIPITOR) 20 MG tablet Take 20 mg by mouth daily. 04/01/20   [provider]  benzonatate (TESSALON) 200 MG capsule Take 1 capsule (200 mg total) by mouth 2 (two) times daily as needed for cough. 09/23/21   Hunsucker, Bonna Gains, MD  busPIRone (BUSPAR) 5 MG tablet Take 5 mg by mouth 2 (two) times daily. 04/02/22   [provider]  carvedilol (COREG) 3.125 MG tablet Take 3.125 mg by mouth 2 (two) times daily. 08/28/21   [provider]  Cholecalciferol (VITAMIN D3) 2000 units capsule Take 1 capsule (2,000 Units total) by mouth daily. 03/17/16   Golden Circle, FNP  escitalopram (LEXAPRO) 20 MG tablet Take 1 tablet (20 mg total) by mouth daily. 12/31/21   Hunsucker, Bonna Gains, MD  fluticasone-salmeterol (WIXELA INHUB) 500-50 MCG/ACT AEPB Inhale 1 puff into the lungs in the morning and at bedtime. 12/31/21   Hunsucker, Bonna Gains, MD  lamoTRIgine (LAMICTAL) 200 MG tablet Take 1 tablet (200 mg total) by mouth 2 (two) times daily. 05/20/21   Suzzanne Cloud, NP  lamoTRIgine (LAMICTAL) 25 MG tablet Take 2 tablets (50 mg total) by mouth at bedtime. 05/20/21   Suzzanne Cloud, NP  montelukast (SINGULAIR) 10 MG tablet Take 10 mg by mouth daily. 05/14/21   [provider]  Na Sulfate-K Sulfate-Mg  Sulf (SUPREP BOWEL PREP KIT) 17.5-3.13-1.6 GM/177ML SOLN Take 1 kit by mouth as directed. For colonoscopy prep 04/07/22   Mansouraty, Telford Nab., MD  Oxcarbazepine (TRILEPTAL) 300 MG tablet Take 1 tablet (300 mg total) by mouth 2 (two) times daily. 05/20/21   Suzzanne Cloud, NP  pantoprazole (PROTONIX) 40 MG tablet Take 1 tablet by mouth daily.    [provider]  Spacer/Aero-Holding Chambers DEVI 2 Inhalers by Does not apply route in the morning and at bedtime. 09/23/21   Hunsucker, Bonna Gains, MD  valsartan (DIOVAN) 160 MG tablet Take 1 tablet (160 mg total) by  mouth daily. 04/29/22   Mayra Reel, NP      Allergies    Zonisamide, Aspirin, Aspirin, Ciprofloxacin, Ciprofloxacin, Codeine, Hydrocodone, Oxycodone, Penicillins, and Penicillins    Review of Systems   Review of Systems  All other systems reviewed and are negative.   Physical Exam Updated Vital Signs BP (!) 176/74 (BP Location: Left Arm)   Pulse (!) 57   Temp 97.9 F (36.6 C) (Oral)   Resp 20   LMP  (LMP Unknown)   SpO2 96%  Physical Exam Vitals and nursing note reviewed.  Constitutional:      Appearance: She is well-developed.  HENT:     Head: Normocephalic and atraumatic.  Cardiovascular:     Rate and Rhythm: Normal rate and regular rhythm.     Heart sounds: No murmur heard. Pulmonary:     Effort: Pulmonary effort is normal. No respiratory distress.     Breath sounds: Normal breath sounds.  Abdominal:     Palpations: Abdomen is soft.     Tenderness: There is no abdominal tenderness. There is no guarding or rebound.  Musculoskeletal:     Comments: There is mild tenderness to palpation over the left AC joint.  There is full and normal range of motion of the left shoulder.  No lower extremity edema.  No chest wall tenderness.  Skin:    General: Skin is warm and dry.  Neurological:     Mental Status: She is alert and oriented to person, place, and time.     Comments: Visual fields grossly intact.  5 out of 5 strength in all 4 extremities with sensation to light touch intact in all 4 extremities.  No asymmetry of facial movements.  Psychiatric:        Behavior: Behavior normal.     ED Results / Procedures / Treatments   Labs (all labs ordered are listed, but only abnormal results are displayed) Labs Reviewed  BASIC METABOLIC PANEL - Abnormal; Notable for the following components:      Result Value   Potassium 3.4 (*)    Creatinine, Ser 1.20 (*)    GFR, Estimated 51 (*)    All other components within normal limits  CBC WITH DIFFERENTIAL/PLATELET -  Abnormal; Notable for the following components:   WBC 11.8 (*)    Lymphs Abs 4.1 (*)    All other components within normal limits  TROPONIN I (HIGH SENSITIVITY)  TROPONIN I (HIGH SENSITIVITY)    EKG EKG Interpretation  Date/Time:  Thursday May 07 2022 02:32:51 EST Ventricular Rate:  60 PR Interval:  187 QRS Duration: 190 QT Interval:  532 QTC Calculation: 532 R Axis:   -83 Text Interpretation: VENTRICULAR PACED RHYTHM Confirmed by Quintella Reichert [phone]) on 05/07/2022 3:07:49 AM  Radiology CT Head Wo Contrast  Result Date: 05/07/2022 CLINICAL DATA:  Dizziness and headache. EXAM: CT HEAD WITHOUT CONTRAST TECHNIQUE:  Contiguous axial images were obtained from the base of the skull through the vertex without intravenous contrast. RADIATION DOSE REDUCTION: This exam was performed according to the departmental dose-optimization program which includes automated exposure control, adjustment of the mA and/or kV according to patient size and/or use of iterative reconstruction technique. COMPARISON:  June 19, 2020 FINDINGS: Brain: There is mild cerebral atrophy with widening of the extra-axial spaces and ventricular dilatation. There are areas of decreased attenuation within the white matter tracts of the supratentorial brain, consistent with microvascular disease changes. Vascular: No hyperdense vessel or unexpected calcification. Skull: Normal. Negative for fracture or focal lesion. Sinuses/Orbits: No acute finding. Other: None. IMPRESSION: 1. No acute intracranial abnormality. 2. Generalized cerebral atrophy and microvascular disease changes of the supratentorial brain. Electronically Signed   By: Virgina Norfolk M.D.   On: 05/07/2022 04:35   DG Shoulder Left  Result Date: 05/07/2022 CLINICAL DATA:  Left shoulder pain. EXAM: LEFT SHOULDER - 2+ VIEW COMPARISON:  None Available. FINDINGS: There is no evidence of fracture or dislocation. There is no evidence of arthropathy or other focal  bone abnormality. An overlying dual lead AICD is noted. Soft tissues are unremarkable. IMPRESSION: Negative. Electronically Signed   By: Virgina Norfolk M.D.   On: 05/07/2022 03:02   DG Chest 2 View  Result Date: 05/07/2022 CLINICAL DATA:  Chest pain and left shoulder pain. EXAM: CHEST - 2 VIEW COMPARISON:  August 29, 2021 FINDINGS: Multiple overlying radiopaque cardiac lead wires are noted. There is a dual lead AICD. The heart size and mediastinal contours are within normal limits. Both lungs are clear. The visualized skeletal structures are unremarkable. IMPRESSION: No active cardiopulmonary disease. Electronically Signed   By: Virgina Norfolk M.D.   On: 05/07/2022 02:55    Procedures Procedures    Medications Ordered in ED Medications  acetaminophen (TYLENOL) tablet 650 mg (650 mg Oral Given 05/07/22 0301)    ED Course/ Medical Decision Making/ A&P                           Medical Decision Making Amount and/or Complexity of Data Reviewed Labs: ordered. Radiology: ordered.  Risk OTC drugs.   Patient with history of epilepsy, hypertension here for evaluation of concern for elevated blood pressures.  At the bedside she does have a log of her blood pressures for the last month with pressures predominantly between 790W and 409B systolic, lowest blood pressures in the 150s, highest is 219.  She has had multiple blood pressures over the course of the month 353 or more systolic.  She does complain of new left shoulder pain as well as chronic headaches.  Her headache is currently gone.  She has no focal neurologic deficits.  Given her escalating frequency of headaches a CT head was obtained, which is negative for acute abnormality.  Current clinical picture is not consistent with hypertensive urgency, subarachnoid hemorrhage, CVA.  Labs demonstrate stable renal insufficiency, normal troponins.  EKG with paced rhythm, unchanged when compared to priors.  Patient's shoulder pain is improved  after acetaminophen.  Chest x-ray, plain film of the shoulder are negative for acute abnormality-images personally reviewed and interpreted, agree with radiologist interpretation.  Discussed with patient findings of studies.  Discussed long-term blood pressure control.  She did just have an increase in her valsartan yesterday, do not feel that we should further increase her medication at this time.  Discussed continuing her current medication regimen with plan for PCP  follow-up in 1 week.  Discussed this plan with patient's brother at the bedside as well.  Current clinical picture is not consistent with ACS, PE, dissection.  Return precautions discussed.        Final Clinical Impression(s) / ED Diagnoses Final diagnoses:  Acute pain of left shoulder  Essential hypertension  Bad headache    Rx / DC Orders ED Discharge Orders     None         Quintella Reichert, MD 05/07/22 [phone]

## 2022-05-08 DIAGNOSIS — I130 Hypertensive heart and chronic kidney disease with heart failure and stage 1 through stage 4 chronic kidney disease, or unspecified chronic kidney disease: Secondary | ICD-10-CM | POA: Diagnosis not present

## 2022-05-08 DIAGNOSIS — I509 Heart failure, unspecified: Secondary | ICD-10-CM | POA: Diagnosis not present

## 2022-05-08 DIAGNOSIS — G4733 Obstructive sleep apnea (adult) (pediatric): Secondary | ICD-10-CM | POA: Diagnosis not present

## 2022-05-08 DIAGNOSIS — Z6832 Body mass index (BMI) 32.0-32.9, adult: Secondary | ICD-10-CM | POA: Diagnosis not present

## 2022-05-08 DIAGNOSIS — I10 Essential (primary) hypertension: Secondary | ICD-10-CM | POA: Diagnosis not present

## 2022-05-13 ENCOUNTER — Ambulatory Visit: Payer: Medicare HMO | Attending: Student

## 2022-05-13 DIAGNOSIS — I10 Essential (primary) hypertension: Secondary | ICD-10-CM

## 2022-05-13 DIAGNOSIS — I429 Cardiomyopathy, unspecified: Secondary | ICD-10-CM | POA: Diagnosis not present

## 2022-05-13 LAB — BASIC METABOLIC PANEL
BUN/Creatinine Ratio: 8 — ABNORMAL LOW (ref 12–28)
BUN: 9 mg/dL (ref 8–27)
CO2: 23 mmol/L (ref 20–29)
Calcium: 9.3 mg/dL (ref 8.7–10.3)
Chloride: 104 mmol/L (ref 96–106)
Creatinine, Ser: 1.13 mg/dL — ABNORMAL HIGH (ref 0.57–1.00)
Glucose: 100 mg/dL — ABNORMAL HIGH (ref 70–99)
Potassium: 3.8 mmol/L (ref 3.5–5.2)
Sodium: 142 mmol/L (ref 134–144)
eGFR: 55 mL/min/{1.73_m2} — ABNORMAL LOW (ref >59)

## 2022-05-19 ENCOUNTER — Other Ambulatory Visit: Payer: Self-pay | Admitting: Physician Assistant

## 2022-05-19 NOTE — Progress Notes (Unsigned)
PATIENT: [name] [name] DOB: 16-Jun-1960  REASON FOR VISIT: follow up HISTORY FROM: patient Primary Neurologist: Dr. Jannifer [name] retired will be followed by Dr. Leta [name]   HISTORY OF PRESENT ILLNESS: Today 05/20/22 Here today alone, her sister drove her. Has been having BP issues, in the ER 05/07/22, med adjustment, seems to be helping. Takes Trileptal 300 mg BID, Lamictal 200/250 mg. Under some stress with another sister moving in, during an argument may have had seizure staring off for 1 minute. Still can have gait instability. 1 fall when leaning forward.   Update 05/20/21 SS: [name] [name] here today for follow-up of history of seizure disorder.  Remains on Trileptal and Lamictal.  Last seizure was in January 2022 when she was significantly ill. At last visit in Jan 2022 Lamictal level was 10.7, Trileptal was 17. No seizures of recent. Still lives with sister, doesn't drive a car. Other than lingering cough, no health issues. If she turns a corner, she can veer off, this is not new. There have been no falls. Here today alone. In the past the dosage of Trileptal has been reduced to help with gait stability without change, the dose was increased back. Has pacemaker.   Update 11/11/2020 SS: [name] [name] is a 61 year old female with history of seizure disorder.  Last seizure was in January 2022, when she had significant illness requiring intubation with hypoxia and elevated blood glucose.  She is on Trileptal and Lamictal.  No further seizure events. Both medications were therapeutic at last office visit.  She is living with her sister.  Is currently not driving a car.  Has a pacemaker, sees cardiology. Claims has been doing well. At times feels dizzy, no falls.  Here today for evaluation unaccompanied.  HISTORY 07/08/2020 Dr. Jannifer [name]: [name] [name] is a 62 year old left-handed white female with a history of a seizure disorder.  The patient has been treated with Trileptal and lamotrigine, she has  not had a seizure since December 2019.  Unfortunately, the patient developed a gradual onset of shortness of breath several days prior to a June 19, 2020 admission to the hospital.  The patient began having severe shortness of breath on the day of admission, EMS was called and the patient had a seizure prior to going in the hospital.  She was noted to have oxygen saturations in the 60% range at that time, her white blood count was 18.6 when she went in the hospital.  She was felt to have an aspiration pneumonia and required intubation.  During the hospitalization, she was extubated and then had another seizure following extubation and then had to be reintubated again.  The patient never had blood levels of the Trileptal or lamotrigine done on admission.  Her ammonia level is 147 with the upper range of normal being 35.  The patient has since been discharged from the hospital, she remains on her current dose of the lamotrigine taking 200 mg twice daily and 50 mg in the evening and she is on Trileptal 300 mg twice daily.  Higher levels of Trileptal in the past have resulted in increased gait instability.  The patient has not had any seizures since coming out of the hospital.  She is not operating a motor vehicle.  She reports some gait instability and fatigue and drowsiness at times, she has not had any falls.  She has not had any further seizures coming out of the hospital.  She reports occasional migratory head pains consistent with "ice pick pains".  She denies any numbness or weakness of the extremities.  She has a pacemaker in place, she cannot have MRI.  A CT scan of the brain was done in the hospital and was unremarkable.   REVIEW OF SYSTEMS: Out of a complete 14 system review of symptoms, the patient complains only of the following symptoms, and all other reviewed systems are negative.  N/A  ALLERGIES: Allergies  Allergen Reactions   Zonisamide Anxiety    Panic Attacks   Aspirin     sensitivity    Aspirin     UNK reaction   Ciprofloxacin     Seizures & it keeps her awake   Ciprofloxacin     UNK reaction   Codeine Hives   Hydrocodone Hives   Oxycodone Hives   Penicillins Hives and Swelling    Did it involve swelling of the face/tongue/throat, SOB, or low BP? Yes Did it involve sudden or severe rash/hives, skin peeling, or any reaction on the inside of your mouth or nose? Yes Did you need to seek medical attention at a hospital or doctor's office? No When did it last happen? 1990's   If all above answers are "NO", may proceed with cephalosporin use.    Penicillins Hives and Swelling    Pt tolerated ampicillin/sulbactam without issues 06/2020    HOME MEDICATIONS: Outpatient Medications Prior to Visit  Medication Sig Dispense Refill   acetaminophen (TYLENOL) 500 MG tablet Take 500-1,000 mg by mouth every 6 (six) hours as needed for moderate pain (pain).      albuterol (VENTOLIN HFA) 108 (90 Base) MCG/ACT inhaler Inhale 2 puffs into the lungs every 6 (six) hours as needed for wheezing or shortness of breath. 8 g 2   atorvastatin (LIPITOR) 20 MG tablet Take 20 mg by mouth daily.     benzonatate (TESSALON) 200 MG capsule Take 1 capsule (200 mg total) by mouth 2 (two) times daily as needed for cough. 60 capsule 3   busPIRone (BUSPAR) 5 MG tablet Take 5 mg by mouth 2 (two) times daily.     carvedilol (COREG) 3.125 MG tablet TAKE 1 TABLET(3.125 MG) BY MOUTH TWICE DAILY 180 tablet 3   Cholecalciferol (VITAMIN D3) 2000 units capsule Take 1 capsule (2,000 Units total) by mouth daily. 90 capsule 0   escitalopram (LEXAPRO) 20 MG tablet Take 1 tablet (20 mg total) by mouth daily. 30 tablet 0   fluticasone-salmeterol (WIXELA INHUB) 500-50 MCG/ACT AEPB Inhale 1 puff into the lungs in the morning and at bedtime. 60 each 6   lamoTRIgine (LAMICTAL) 200 MG tablet Take 1 tablet (200 mg total) by mouth 2 (two) times daily. 180 tablet 3   lamoTRIgine (LAMICTAL) 25 MG tablet Take 2 tablets (50 mg  total) by mouth at bedtime. 180 tablet 3   montelukast (SINGULAIR) 10 MG tablet Take 10 mg by mouth daily.     Na Sulfate-K Sulfate-Mg Sulf (SUPREP BOWEL PREP KIT) 17.5-3.13-1.6 GM/177ML SOLN Take 1 kit by mouth as directed. For colonoscopy prep 354 mL 0   Oxcarbazepine (TRILEPTAL) 300 MG tablet Take 1 tablet (300 mg total) by mouth 2 (two) times daily. 180 tablet 3   pantoprazole (PROTONIX) 40 MG tablet Take 1 tablet by mouth daily.     Spacer/Aero-Holding Chambers DEVI 2 Inhalers by Does not apply route in the morning and at bedtime. 1 each 1   valsartan (DIOVAN) 160 MG tablet Take 1 tablet (160 mg total) by mouth daily. 90 tablet 0   No facility-administered medications  prior to visit.    PAST MEDICAL HISTORY: Past Medical History:  Diagnosis Date   Arthritis    Chronic insomnia 03/07/2015   Diverticulitis    GERD (gastroesophageal reflux disease)    Gout    Hiatal hernia    LBBB (left bundle branch block)    Pacemaker    Medtronic    Panic disorder 09/07/2014   Pneumonia    x 1 - Walking   Seizures ([place])    Last Seizure in 2017 - controlled with meds    Sleep apnea    does not use a CPAP   Wrist tendonitis    R     PAST SURGICAL HISTORY: Past Surgical History:  Procedure Laterality Date   COLONOSCOPY  last 03/31/2016   COLONOSCOPY WITH PROPOFOL N/A 04/04/2019   Procedure: COLONOSCOPY WITH PROPOFOL;  Surgeon: Mauri Pole, MD;  Location: [facility];  Service: Endoscopy;  Laterality: N/A;   COLONOSCOPY WITH PROPOFOL N/A 06/15/2019   Procedure: COLONOSCOPY WITH PROPOFOL;  Surgeon: Rush Landmark Telford Nab., MD;  Location: [place];  Service: Gastroenterology;  Laterality: N/A;   COLONOSCOPY WITH PROPOFOL N/A 02/28/2020   Procedure: COLONOSCOPY WITH PROPOFOL;  Surgeon: Rush Landmark Telford Nab., MD;  Location: [facility];  Service: Gastroenterology;  Laterality: N/A;   ENDOROTOR  02/28/2020   Procedure: ENIDPOEUM;  Surgeon: Mansouraty, Telford Nab., MD;  Location:  [facility];  Service: Gastroenterology;;   ENDOSCOPIC MUCOSAL RESECTION N/A 06/15/2019   Procedure: ENDOSCOPIC MUCOSAL RESECTION;  Surgeon: Irving Copas., MD;  Location: [place];  Service: Gastroenterology;  Laterality: N/A;   ENDOSCOPIC MUCOSAL RESECTION N/A 02/28/2020   Procedure: ENDOSCOPIC MUCOSAL RESECTION;  Surgeon: Rush Landmark Telford Nab., MD;  Location: [facility];  Service: Gastroenterology;  Laterality: N/A;   EP IMPLANTABLE DEVICE N/A 12/13/2014   Procedure: Pacemaker Implant;  Surgeon: Deboraha Sprang, MD;  Location: [place] [facility];  Service: Cardiovascular;  Laterality: N/A;   HAND SURGERY     HEMOSTASIS CLIP PLACEMENT  06/15/2019   Procedure: HEMOSTASIS CLIP PLACEMENT;  Surgeon: Irving Copas., MD;  Location: [place];  Service: Gastroenterology;;   HEMOSTASIS CLIP PLACEMENT  02/28/2020   Procedure: HEMOSTASIS CLIP PLACEMENT;  Surgeon: Irving Copas., MD;  Location: [facility];  Service: Gastroenterology;;   HEMOSTASIS CONTROL  02/28/2020   Procedure: HEMOSTASIS CONTROL;  Surgeon: Irving Copas., MD;  Location: [facility];  Service: Gastroenterology;;   POLYPECTOMY     POLYPECTOMY  04/04/2019   Procedure: POLYPECTOMY;  Surgeon: Mauri Pole, MD;  Location: [facility];  Service: Endoscopy;;   POLYPECTOMY  06/15/2019   Procedure: POLYPECTOMY;  Surgeon: Irving Copas., MD;  Location: [facility];  Service: Gastroenterology;;   SKIN CANCER EXCISION     SUBMUCOSAL LIFTING INJECTION  06/15/2019   Procedure: SUBMUCOSAL LIFTING INJECTION;  Surgeon: Irving Copas., MD;  Location: [facility];  Service: Gastroenterology;;   TUBAL LIGATION     UPPER GI ENDOSCOPY     WISDOM TOOTH EXTRACTION      FAMILY HISTORY: Family History  Problem Relation Age of Onset   Allergic rhinitis Mother    Alzheimer's disease Mother    Hypertension Mother    Colon polyps Mother    Allergic rhinitis Father     Hypertension Father    Stroke Father    Leukemia Father    Prostate cancer Father    Colon polyps Father    Allergic rhinitis Sister    Colon polyps Sister    Allergic rhinitis Sister  Kidney failure Sister    Diabetes Sister    Colon polyps Sister    Allergic rhinitis Brother    Stroke Brother    Hypertension Brother    Sleep apnea Brother    Colon polyps Brother    Colon cancer Paternal Aunt    Colon cancer Paternal Uncle    Parkinson's disease Maternal Grandmother    Coronary artery disease Maternal Grandfather    Esophageal cancer Neg Hx    Stomach cancer Neg Hx    Rectal cancer Neg Hx    Inflammatory bowel disease Neg Hx    Liver disease Neg Hx    Pancreatic cancer Neg Hx    Seizures Neg Hx     SOCIAL HISTORY: Social History   Socioeconomic History   Marital status: Single    Spouse name: Not on file   Number of children: 0   Years of education: 14   Highest education level: Not on file  Occupational History   Not on file  Tobacco Use   Smoking status: Some Days    Packs/day: 0.50    Years: 45.00    Total pack years: 22.50    Types: Cigarettes    Start date: 1977    Passive exposure: Current   Smokeless tobacco: Never   Tobacco comments:    Pt states she smokes 2 cigs a day and sometime 4 a week  Vaping Use   Vaping Use: Never used  Substance and Sexual Activity   Alcohol use: Not on file    Comment: Quit 12/06/2009   Drug use: Never   Sexual activity: Not on file  Other Topics Concern   Not on file  Social History Narrative   Lives with sister   Left handed   Drinks 1-2 cups caffeine daily   Social Determinants of Health   Financial Resource Strain: Not on file  Food Insecurity: Not on file  Transportation Needs: Not on file  Physical Activity: Not on file  Stress: Not on file  Social Connections: Not on file  Intimate Partner Violence: Not on file   PHYSICAL EXAM  Vitals:   05/20/22 0914  BP: (!) 157/66  Pulse: 60  Weight: 176  lb 9.6 oz (80.1 kg)  Height: _0  (1.549 m)   Body mass index is 33.37 kg/m.  Generalized: Well developed, in no acute distress  Neurological examination  Mentation: Alert oriented to time, place, history taking. Follows all commands speech and language fluent Cranial nerve II-XII: Pupils were equal round reactive to light. Extraocular movements were full, visual field were full on confrontational test. Facial sensation and strength were normal. Head turning and shoulder shrug  were normal and symmetric. Motor: The motor testing reveals 5 over 5 strength of all 4 extremities. Good symmetric motor tone is noted throughout.  Sensory: Sensory testing is intact to soft touch on all 4 extremities. No evidence of extinction is noted.  Coordination: Cerebellar testing reveals good finger-nose-finger and heel-to-shin bilaterally.  Gait and station: Gait is normal in hallway,  Tandem gait is slightly unsteady. No assistive devices.  Reflexes: Deep tendon reflexes are symmetric and normal bilaterally.   DIAGNOSTIC DATA (LABS, IMAGING, TESTING) - I reviewed patient records, labs, notes, testing and imaging myself where available.  Lab Results  Component Value Date   WBC 11.8 (H) 05/07/2022   HGB 13.8 05/07/2022   HCT 40.9 05/07/2022   MCV 93.2 05/07/2022   PLT 245 05/07/2022      Component Value  Date/Time   NA 142 05/13/2022 1125   K 3.8 05/13/2022 1125   CL 104 05/13/2022 1125   CO2 23 05/13/2022 1125   GLUCOSE 100 (H) 05/13/2022 1125   GLUCOSE 97 05/07/2022 0302   BUN 9 05/13/2022 1125   CREATININE 1.13 (H) 05/13/2022 1125   CALCIUM 9.3 05/13/2022 1125   PROT 6.9 05/20/2021 1340   ALBUMIN 4.4 05/20/2021 1340   AST 12 05/20/2021 1340   ALT 12 05/20/2021 1340   ALKPHOS 172 (H) 05/20/2021 1340   BILITOT 0.3 05/20/2021 1340   GFRNONAA 51 (L) 05/07/2022 0302   GFRAA 53 (L) 07/29/2020 0902   Lab Results  Component Value Date   CHOL 257 (H) 03/02/2016   HDL 41.90 03/02/2016    LDLDIRECT 199.0 03/02/2016   TRIG 304 (H) 06/25/2020   CHOLHDL 6 03/02/2016   Lab Results  Component Value Date   HGBA1C 4.9 06/19/2020   Lab Results  Component Value Date   VITAMINB12 177 (L) 04/11/2015   Lab Results  Component Value Date   TSH 1.399 12/12/2014   ASSESSMENT AND PLAN 61 y.o. year old female  has a past medical history of Arthritis, Chronic insomnia (03/07/2015), Diverticulitis, GERD (gastroesophageal reflux disease), Gout, Hiatal hernia, LBBB (left bundle branch block), Pacemaker, Panic disorder (09/07/2014), Pneumonia, Seizures ([place]), Sleep apnea, and Wrist tendonitis. here with:  1.  History of seizures  -Check labs today -Continue current doses of Trileptal and Lamictal -Call for seizures, otherwise follow-up in 1 year  Meds ordered this encounter  Medications   Oxcarbazepine (TRILEPTAL) 300 MG tablet    Sig: Take 1 tablet (300 mg total) by mouth 2 (two) times daily.    Dispense:  180 tablet    Refill:  3   lamoTRIgine (LAMICTAL) 25 MG tablet    Sig: Take 2 tablets (50 mg total) by mouth at bedtime.    Dispense:  180 tablet    Refill:  3   lamoTRIgine (LAMICTAL) 200 MG tablet    Sig: Take 1 tablet (200 mg total) by mouth 2 (two) times daily.    Dispense:  180 tablet    Refill:  3   Butler Denmark, [place], DNP 05/20/2022, 9:43 AM [name] [address], [place], [place] [address] [phone]

## 2022-05-20 ENCOUNTER — Ambulatory Visit: Payer: Medicare HMO | Admitting: Neurology

## 2022-05-20 ENCOUNTER — Encounter: Payer: Self-pay | Admitting: Neurology

## 2022-05-20 VITALS — BP 157/66 | HR 60 | Ht 61.0 in | Wt 176.6 lb

## 2022-05-20 DIAGNOSIS — D235 Other benign neoplasm of skin of trunk: Secondary | ICD-10-CM | POA: Diagnosis not present

## 2022-05-20 DIAGNOSIS — D2271 Melanocytic nevi of right lower limb, including hip: Secondary | ICD-10-CM | POA: Diagnosis not present

## 2022-05-20 DIAGNOSIS — L570 Actinic keratosis: Secondary | ICD-10-CM | POA: Diagnosis not present

## 2022-05-20 DIAGNOSIS — R569 Unspecified convulsions: Secondary | ICD-10-CM

## 2022-05-20 DIAGNOSIS — D485 Neoplasm of uncertain behavior of skin: Secondary | ICD-10-CM | POA: Diagnosis not present

## 2022-05-20 DIAGNOSIS — L821 Other seborrheic keratosis: Secondary | ICD-10-CM | POA: Diagnosis not present

## 2022-05-20 DIAGNOSIS — Z85820 Personal history of malignant melanoma of skin: Secondary | ICD-10-CM | POA: Diagnosis not present

## 2022-05-20 DIAGNOSIS — D2372 Other benign neoplasm of skin of left lower limb, including hip: Secondary | ICD-10-CM | POA: Diagnosis not present

## 2022-05-20 DIAGNOSIS — D225 Melanocytic nevi of trunk: Secondary | ICD-10-CM | POA: Diagnosis not present

## 2022-05-20 DIAGNOSIS — L814 Other melanin hyperpigmentation: Secondary | ICD-10-CM | POA: Diagnosis not present

## 2022-05-20 MED ORDER — LAMOTRIGINE 200 MG PO TABS: 200.0000 mg | ORAL_TABLET | Freq: Two times a day (BID) | ORAL | 3 refills | Status: DC

## 2022-05-20 MED ORDER — OXCARBAZEPINE 300 MG PO TABS: 300.0000 mg | ORAL_TABLET | Freq: Two times a day (BID) | ORAL | 3 refills | Status: DC

## 2022-05-20 MED ORDER — LAMOTRIGINE 25 MG PO TABS: 50.0000 mg | ORAL_TABLET | Freq: Every day | ORAL | 3 refills | Status: DC

## 2022-05-25 LAB — COMPREHENSIVE METABOLIC PANEL
ALT: 18 IU/L (ref 0–32)
AST: 21 IU/L (ref 0–40)
Albumin/Globulin Ratio: 1.5 (ref 1.2–2.2)
Albumin: 4.3 g/dL (ref 3.9–4.9)
Alkaline Phosphatase: 205 IU/L — ABNORMAL HIGH (ref 44–121)
BUN/Creatinine Ratio: 9 — ABNORMAL LOW (ref 12–28)
BUN: 10 mg/dL (ref 8–27)
Bilirubin Total: 0.4 mg/dL (ref 0.0–1.2)
CO2: 22 mmol/L (ref 20–29)
Calcium: 9.5 mg/dL (ref 8.7–10.3)
Chloride: 100 mmol/L (ref 96–106)
Creatinine, Ser: 1.1 mg/dL — ABNORMAL HIGH (ref 0.57–1.00)
Globulin, Total: 2.9 g/dL (ref 1.5–4.5)
Glucose: 103 mg/dL — ABNORMAL HIGH (ref 70–99)
Potassium: 3.6 mmol/L (ref 3.5–5.2)
Sodium: 138 mmol/L (ref 134–144)
Total Protein: 7.2 g/dL (ref 6.0–8.5)
eGFR: 57 mL/min/{1.73_m2} — ABNORMAL LOW (ref >59)

## 2022-05-25 LAB — CBC WITH DIFFERENTIAL/PLATELET
Basophils Absolute: 0 10*3/uL (ref 0.0–0.2)
Basos: 0 %
EOS (ABSOLUTE): 0.1 10*3/uL (ref 0.0–0.4)
Eos: 1 %
Hematocrit: 42.5 % (ref 34.0–46.6)
Hemoglobin: 14.2 g/dL (ref 11.1–15.9)
Immature Grans (Abs): 0 10*3/uL (ref 0.0–0.1)
Immature Granulocytes: 0 %
Lymphocytes Absolute: 3 10*3/uL (ref 0.7–3.1)
Lymphs: 33 %
MCH: 30.6 pg (ref 26.6–33.0)
MCHC: 33.4 g/dL (ref 31.5–35.7)
MCV: 92 fL (ref 79–97)
Monocytes Absolute: 0.4 10*3/uL (ref 0.1–0.9)
Monocytes: 5 %
Neutrophils Absolute: 5.4 10*3/uL (ref 1.4–7.0)
Neutrophils: 61 %
Platelets: 252 10*3/uL (ref 150–450)
RBC: 4.64 x10E6/uL (ref 3.77–5.28)
RDW: 12.1 % (ref 11.7–15.4)
WBC: 9 10*3/uL (ref 3.4–10.8)

## 2022-05-25 LAB — 10-HYDROXYCARBAZEPINE: Oxcarbazepine SerPl-Mcnc: 15 ug/mL (ref 10–35)

## 2022-05-25 LAB — LAMOTRIGINE LEVEL: Lamotrigine Lvl: 8.3 ug/mL (ref 2.0–20.0)

## 2022-05-27 ENCOUNTER — Other Ambulatory Visit: Payer: Self-pay

## 2022-05-27 ENCOUNTER — Telehealth: Payer: Self-pay | Admitting: Gastroenterology

## 2022-05-27 MED ORDER — NA SULFATE-K SULFATE-MG SULF 17.5-3.13-1.6 GM/177ML PO SOLN: 1.0000 | ORAL | 0 refills | Status: DC

## 2022-05-27 NOTE — Telephone Encounter (Signed)
Prep re-sent to [facility] pharmacy. Pt has been informed.

## 2022-05-27 NOTE — Telephone Encounter (Signed)
Prep resent to pharmacy. Patient has been informed.

## 2022-05-27 NOTE — Telephone Encounter (Signed)
Inbound call from patient stating that she is scheduled to have a colonoscopy with Dr. Rush Landmark at the hospital on 1/4 and the prep has not been call in to her pharmacy. Requesting prep be sent and to have a returned call once it is sent. Please advise.

## 2022-06-03 ENCOUNTER — Other Ambulatory Visit: Payer: Self-pay

## 2022-06-03 ENCOUNTER — Encounter (HOSPITAL_COMMUNITY): Payer: Self-pay | Admitting: Gastroenterology

## 2022-06-04 ENCOUNTER — Ambulatory Visit
Admission: RE | Admit: 2022-06-04 | Discharge: 2022-06-04 | Disposition: A | Discharge status: Home / Self Care | Payer: Medicare HMO | Source: Ambulatory Visit | Attending: Family Medicine | Admitting: Family Medicine

## 2022-06-04 DIAGNOSIS — M85852 Other specified disorders of bone density and structure, left thigh: Secondary | ICD-10-CM | POA: Diagnosis not present

## 2022-06-04 DIAGNOSIS — Z780 Asymptomatic menopausal state: Secondary | ICD-10-CM | POA: Diagnosis not present

## 2022-06-04 DIAGNOSIS — E2839 Other primary ovarian failure: Secondary | ICD-10-CM

## 2022-06-04 IMAGING — CT CT HEAD W/O CM
3 of 4 series · 16 of 47 positions shown, 19 images · non-contrast
Comparison: 10/22/2014

CLINICAL DATA: Altered mental status

EXAM:
CT HEAD WITHOUT CONTRAST
TECHNIQUE: Contiguous axial images were obtained from the base of the skull
through the vertex without intravenous contrast.

[Series 4: head 2.0 h70h · axial · 0.42mm/px · z∈[-94,+56]mm · 10 of 85 slices shown, 13 images]
[im 5/85  brain]
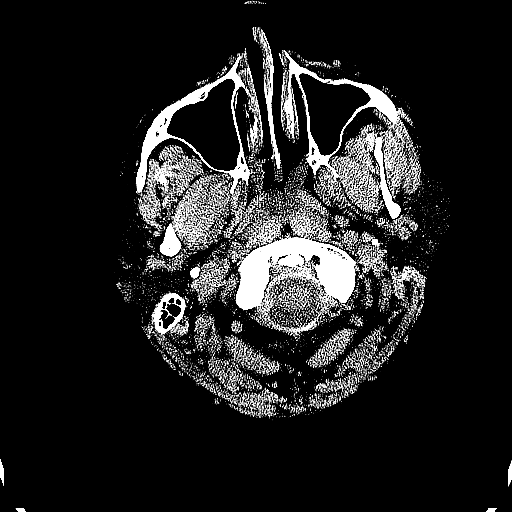
[im 5/85  bone]
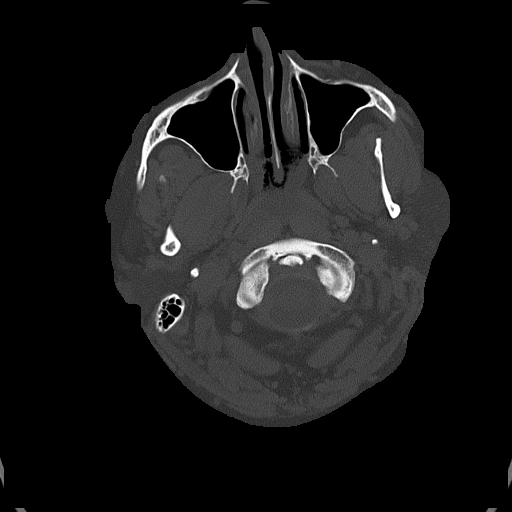
[im 13/85  brain]
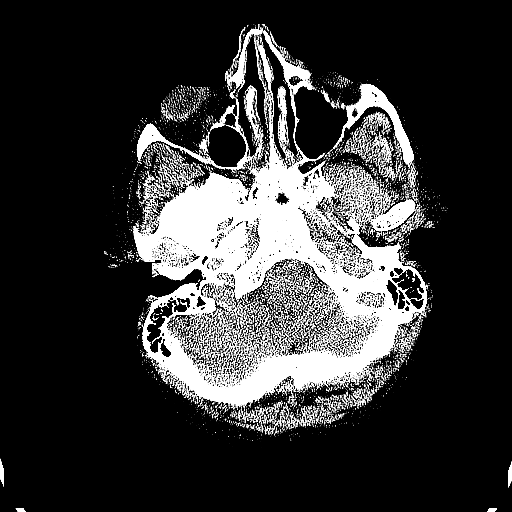
[im 22/85  brain]
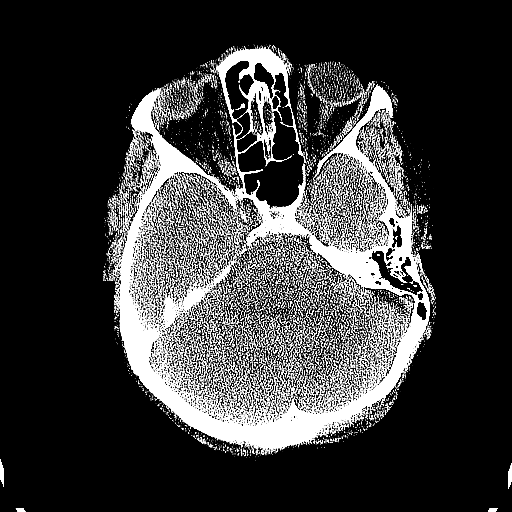
[im 30/85  brain]
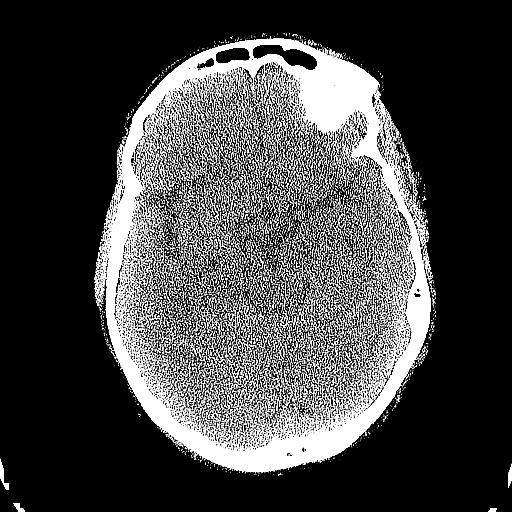
[im 38/85  brain]
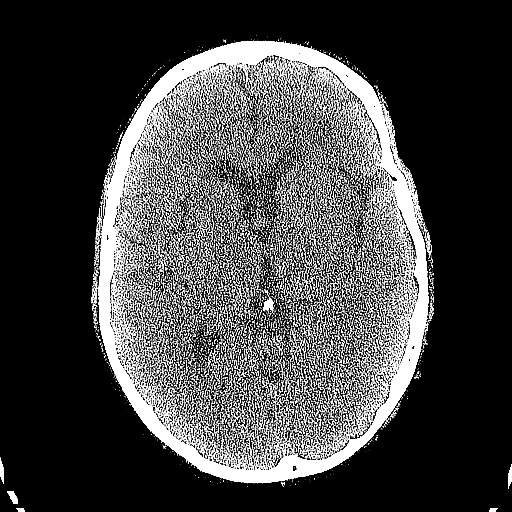
[im 38/85  bone]
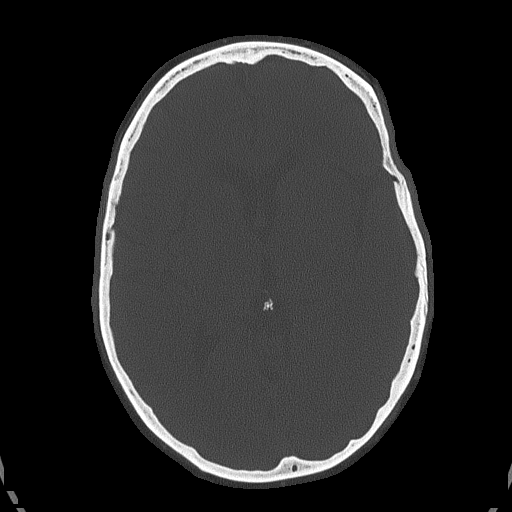
[im 47/85  brain]
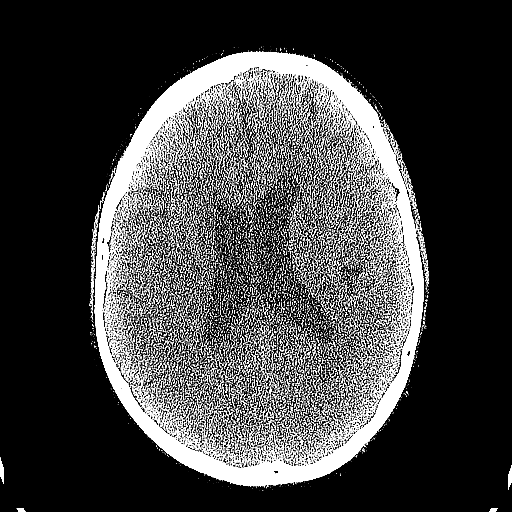
[im 55/85  brain]
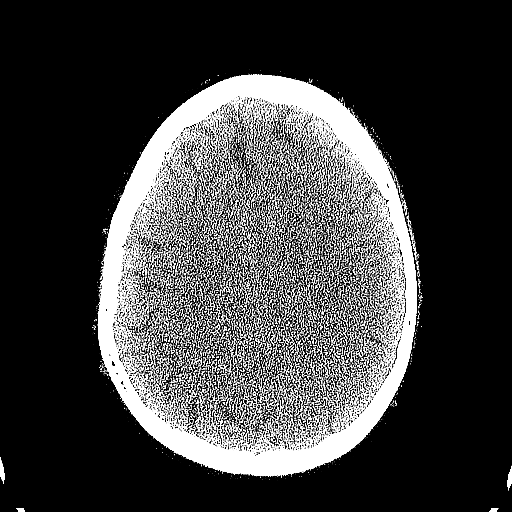
[im 64/85  brain]
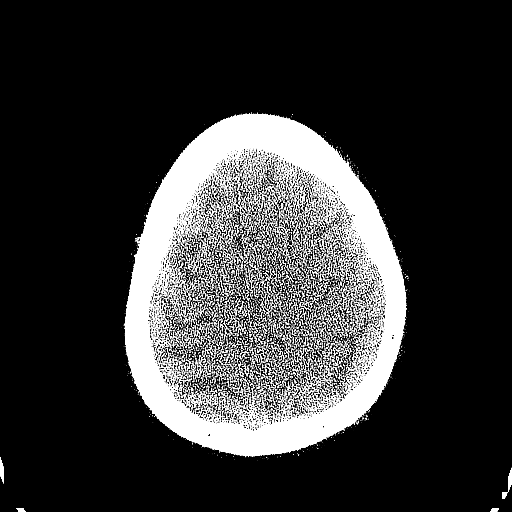
[im 72/85  brain]
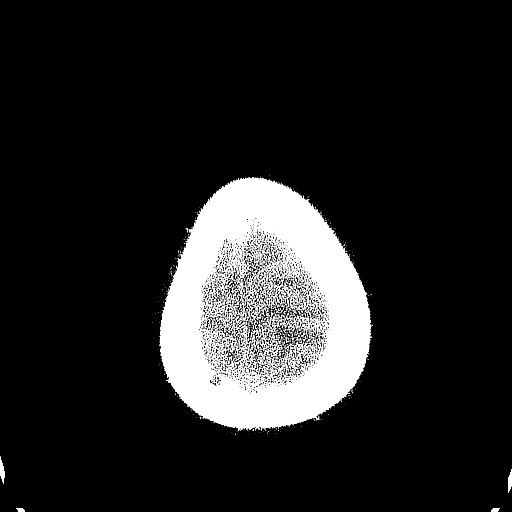
[im 72/85  bone]
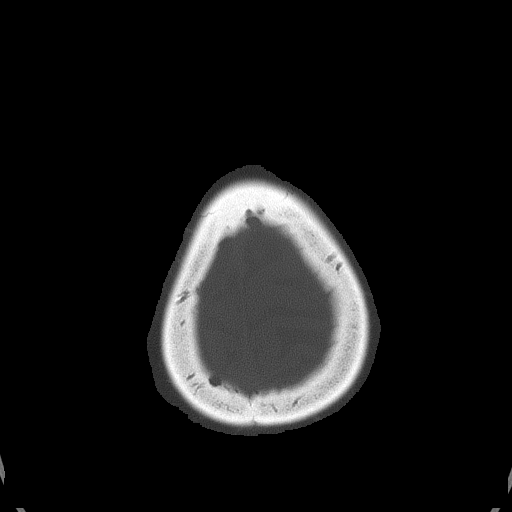
[im 80/85  brain]
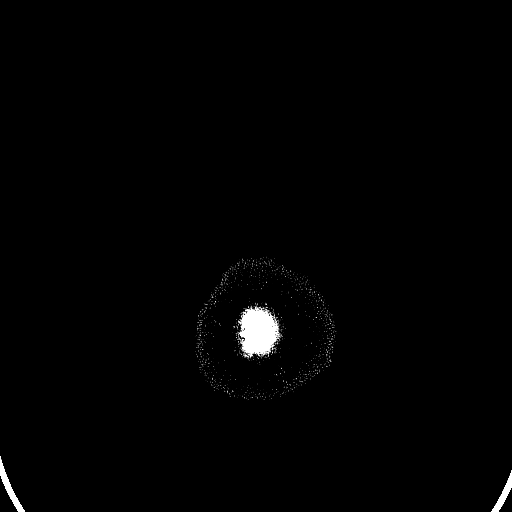

[Series 5: head 3.0 mpr cor · coronal · 0.34mm/px · 3 of 68 slices shown]
[im 23/68  brain]
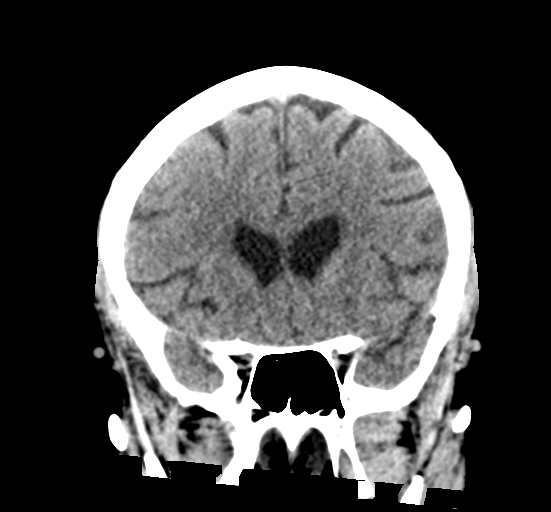
[im 30/68  brain]
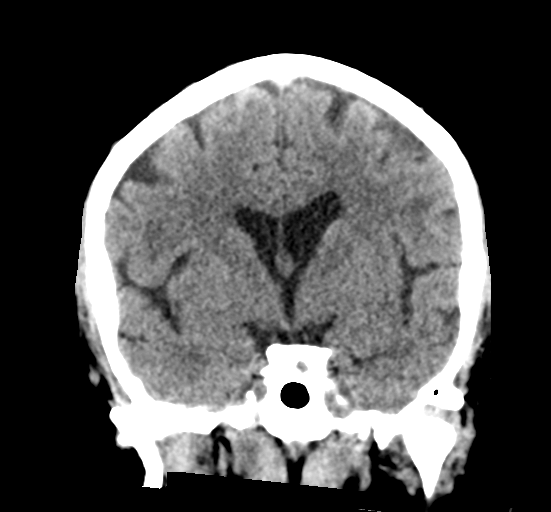
[im 38/68  brain]
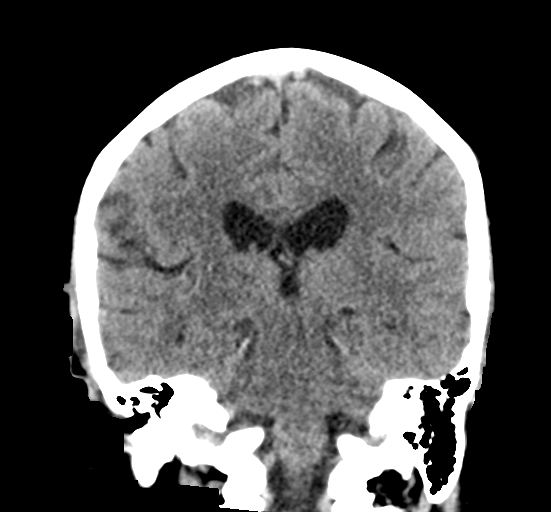

[Series 6: head 3.0 mpr sag · sagittal · 0.35mm/px · 3 of 58 slices shown]
[im 20/58  brain]
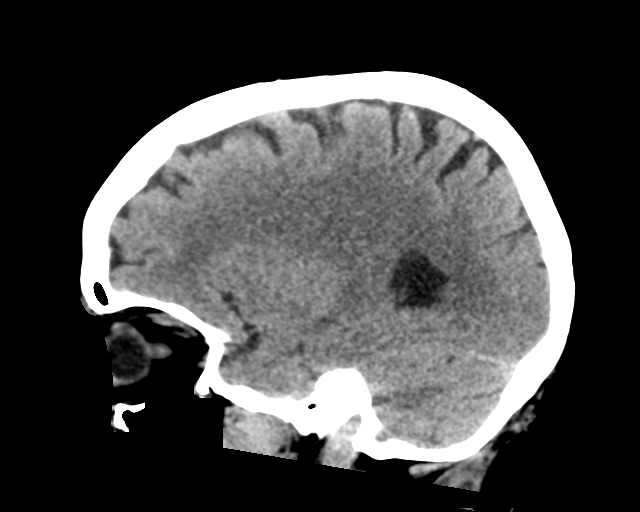
[im 29/58  brain]
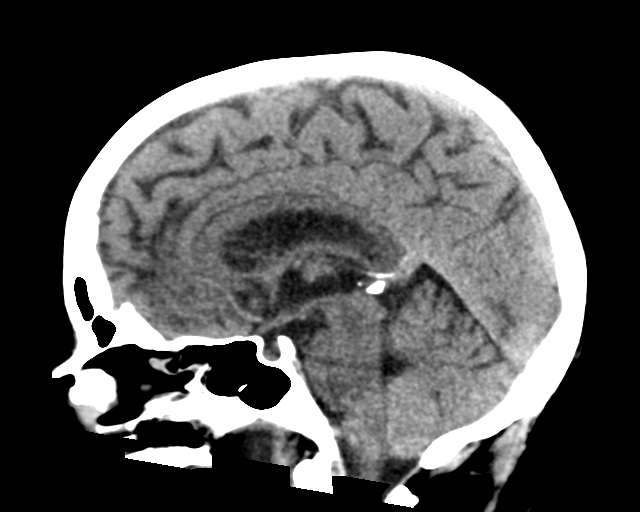
[im 39/58  brain]
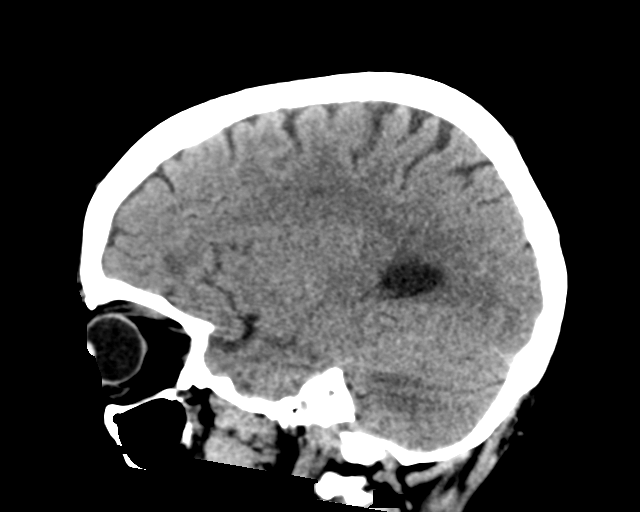

[16 of 47 positions shown; findings below may reference images not displayed]

FINDINGS: Brain: No acute intracranial abnormality. Specifically, no
hemorrhage, hydrocephalus, mass lesion, acute infarction, or
significant intracranial injury.

Vascular: No hyperdense vessel or unexpected calcification.

Skull: No acute calvarial abnormality.

Sinuses/Orbits: Visualized paranasal sinuses and mastoids clear.
Orbital soft tissues unremarkable.

Other: None
IMPRESSION: No acute intracranial abnormality.

## 2022-06-04 IMAGING — DX DG CHEST 1V PORT
1 series · 1 of 1 positions shown · non-contrast
Comparison: Radiograph 12/14/2014

CLINICAL DATA: ET placement

EXAM:
PORTABLE CHEST 1 VIEW

[chest ap]
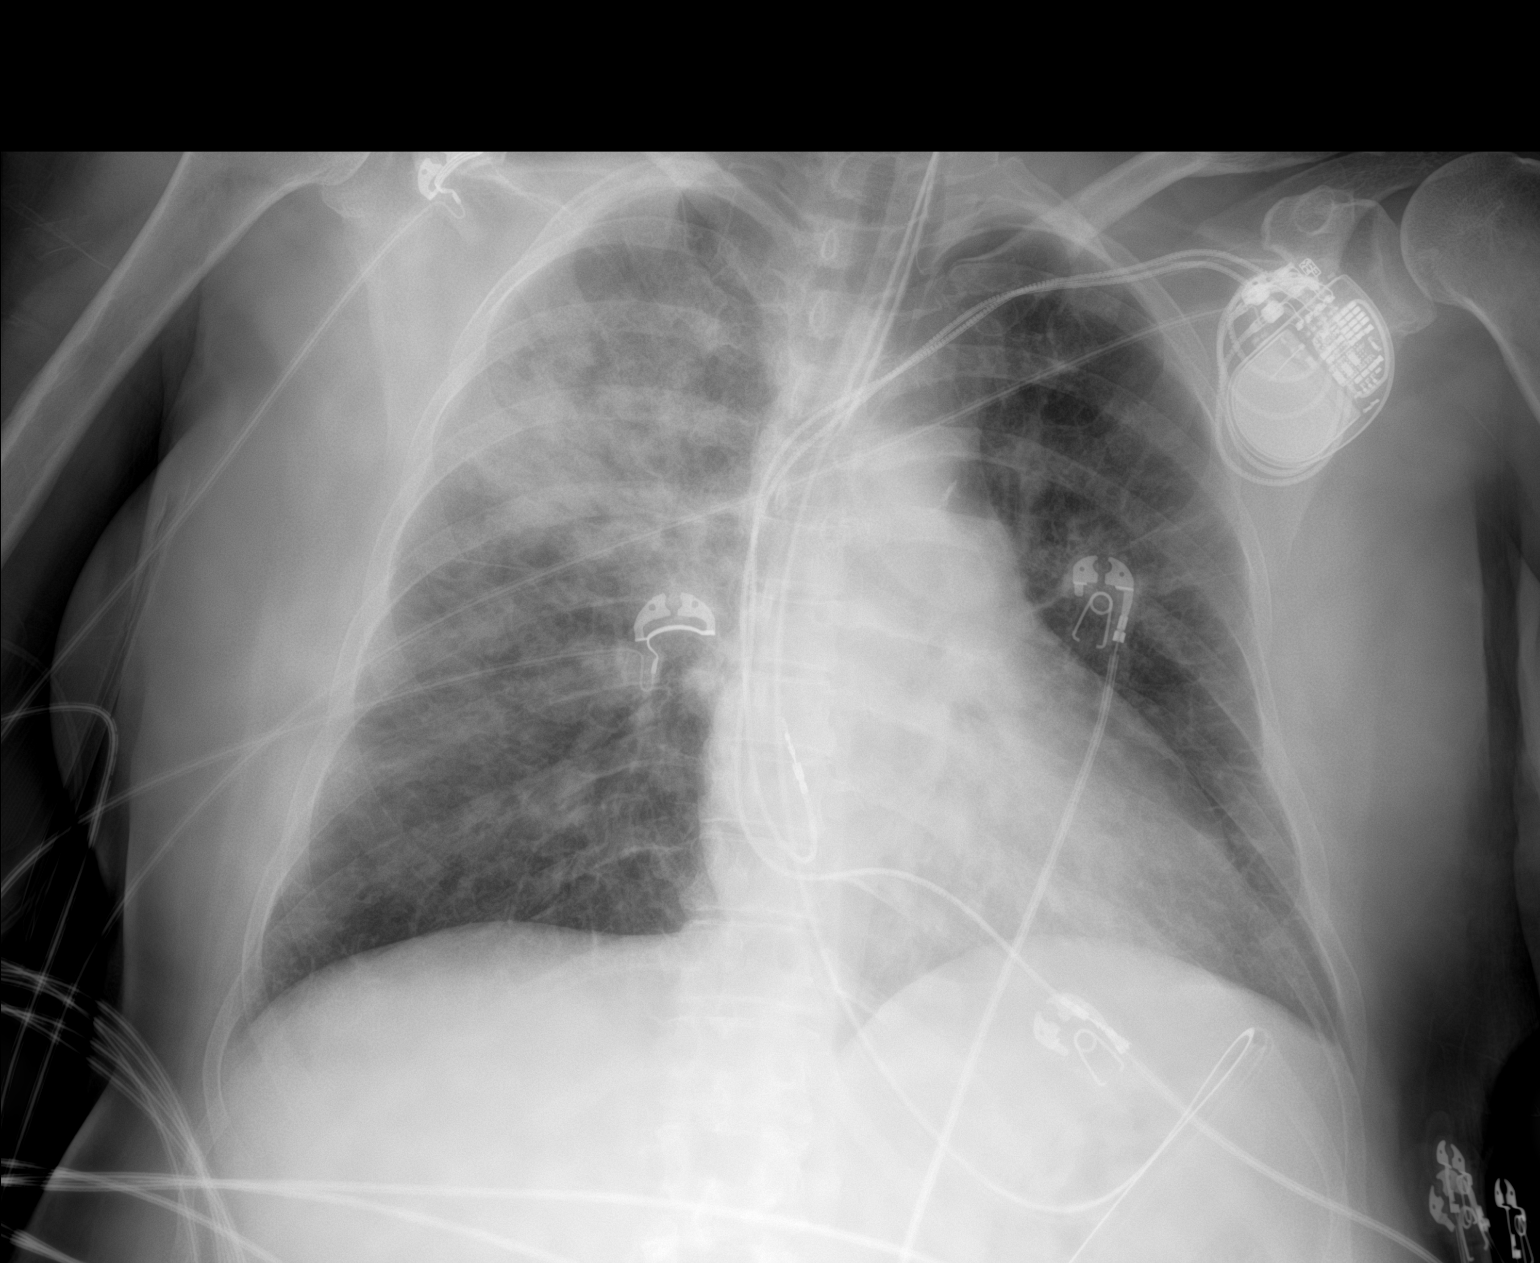

[1 of 1 positions shown; findings below may reference images not displayed]

FINDINGS: Endotracheal tube tip low in the trachea, 2.8 cm from carina.
Consider retraction 1-2 cm to the mid trachea.

Transesophageal tube tip and side port distal to the GE junction,
beyond the margins of imaging.

Telemetry leads and external support devices overlie the chest.

Left chest wall battery pack with pacer leads at the right atrium
and cardiac apex.

Dense airspace opacities seen focally in the right upper lobe.
Additional hazy interstitial opacities with fissural and septal
thickening and pulmonary vascular congestion are seen elsewhere. No
pneumothorax or effusion. Cardiomediastinal contours are similar to
priors accounting for differences in technique. The aorta is
calcified. The remaining cardiomediastinal contours are
unremarkable. No acute osseous or soft tissue abnormality.
Degenerative changes are present in the imaged spine and shoulders.
IMPRESSION: 1. Endotracheal tube tip low in the trachea, 2.8 cm from carina.
Consider retraction 1-2 cm to the mid trachea.
2. Transesophageal tube tip and side port distal to the GE junction,
beyond the margins of imaging.
3. Dense opacity within the right upper lobe, could reflect
infection or asymmetric pulmonary edema with more diffuse
interstitial edematous changes elsewhere in the lungs.

## 2022-06-05 ENCOUNTER — Encounter: Payer: Self-pay | Admitting: Internal Medicine

## 2022-06-05 IMAGING — DX DG ABDOMEN 1V
1 series · 1 of 1 positions shown · non-contrast
Comparison: None.

CLINICAL DATA: Vomiting

EXAM:
ABDOMEN - 1 VIEW

[abdomen]
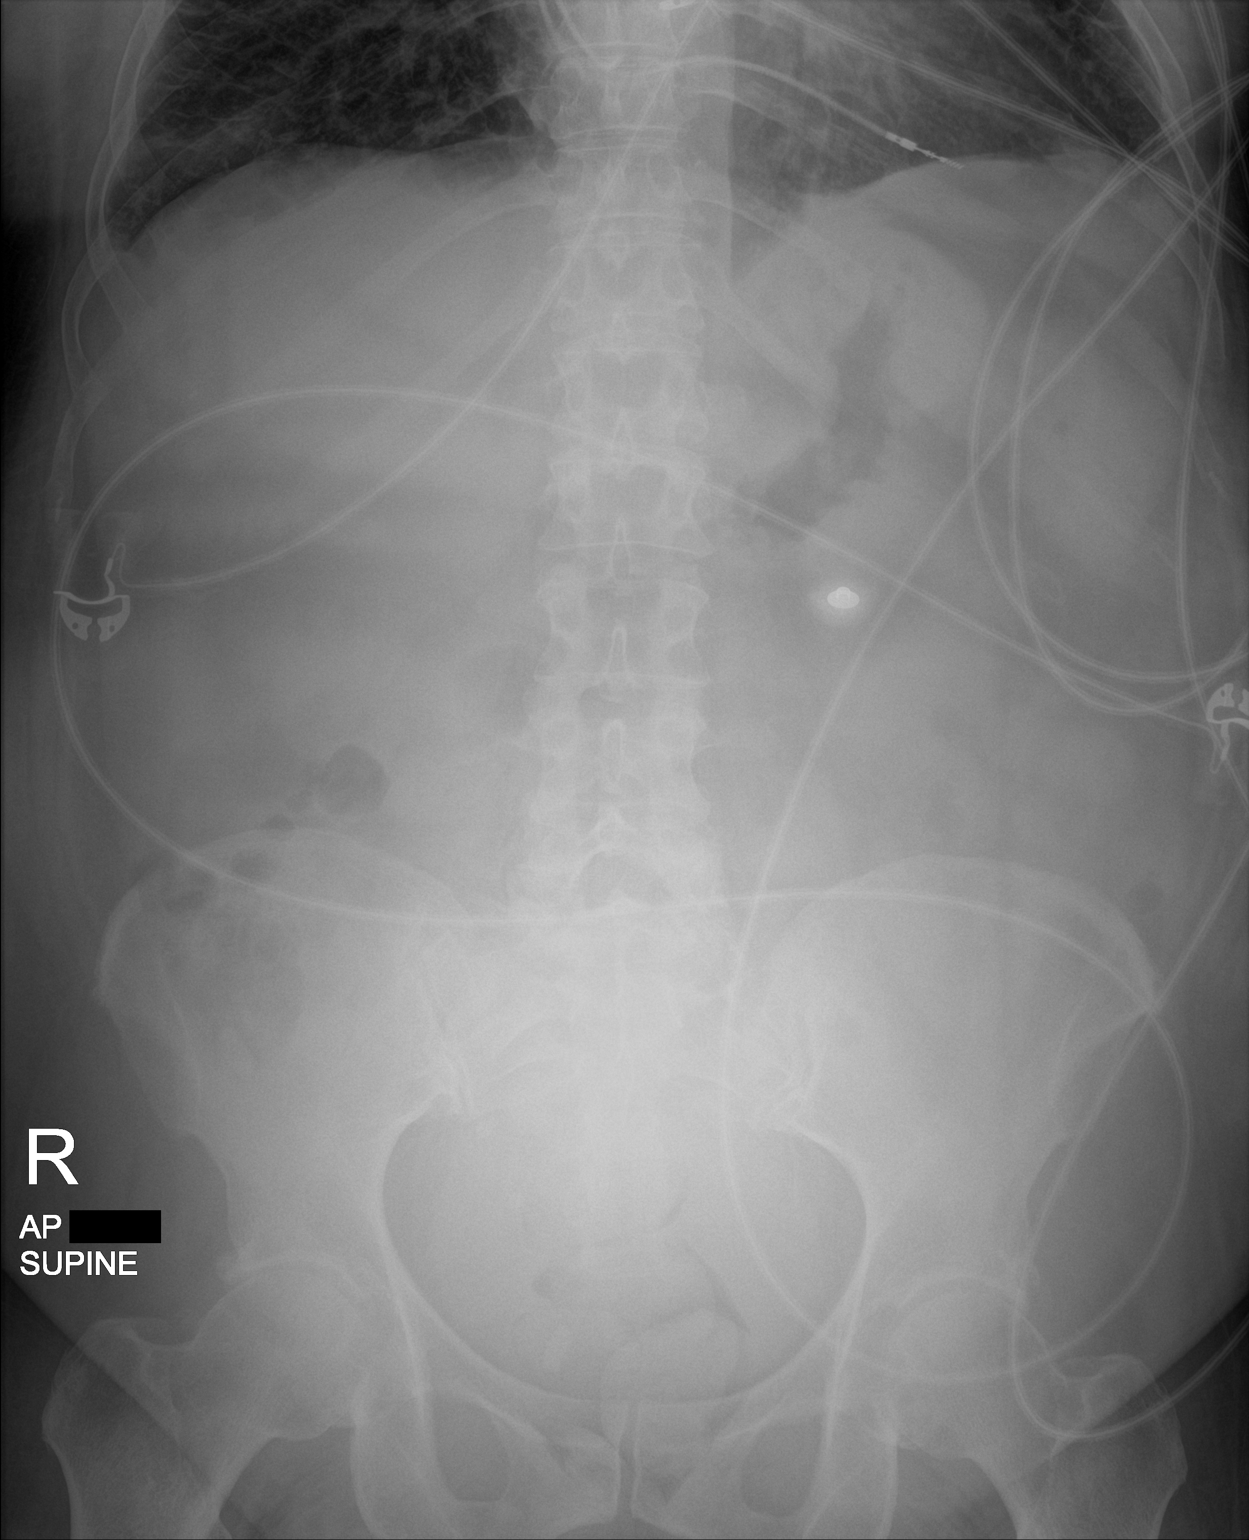

[1 of 1 positions shown; findings below may reference images not displayed]

FINDINGS: The bowel gas pattern is normal. No radio-opaque calculi or other
significant radiographic abnormality are seen.
IMPRESSION: No evidence of bowel obstruction.

## 2022-06-05 IMAGING — DX DG CHEST 1V PORT
1 series · 1 of 1 positions shown · non-contrast
Comparison: Chest radiograph dated 06/19/2020.

CLINICAL DATA: 59-year-old female with acute respiratory failure.

EXAM:
PORTABLE CHEST 1 VIEW

[chest]
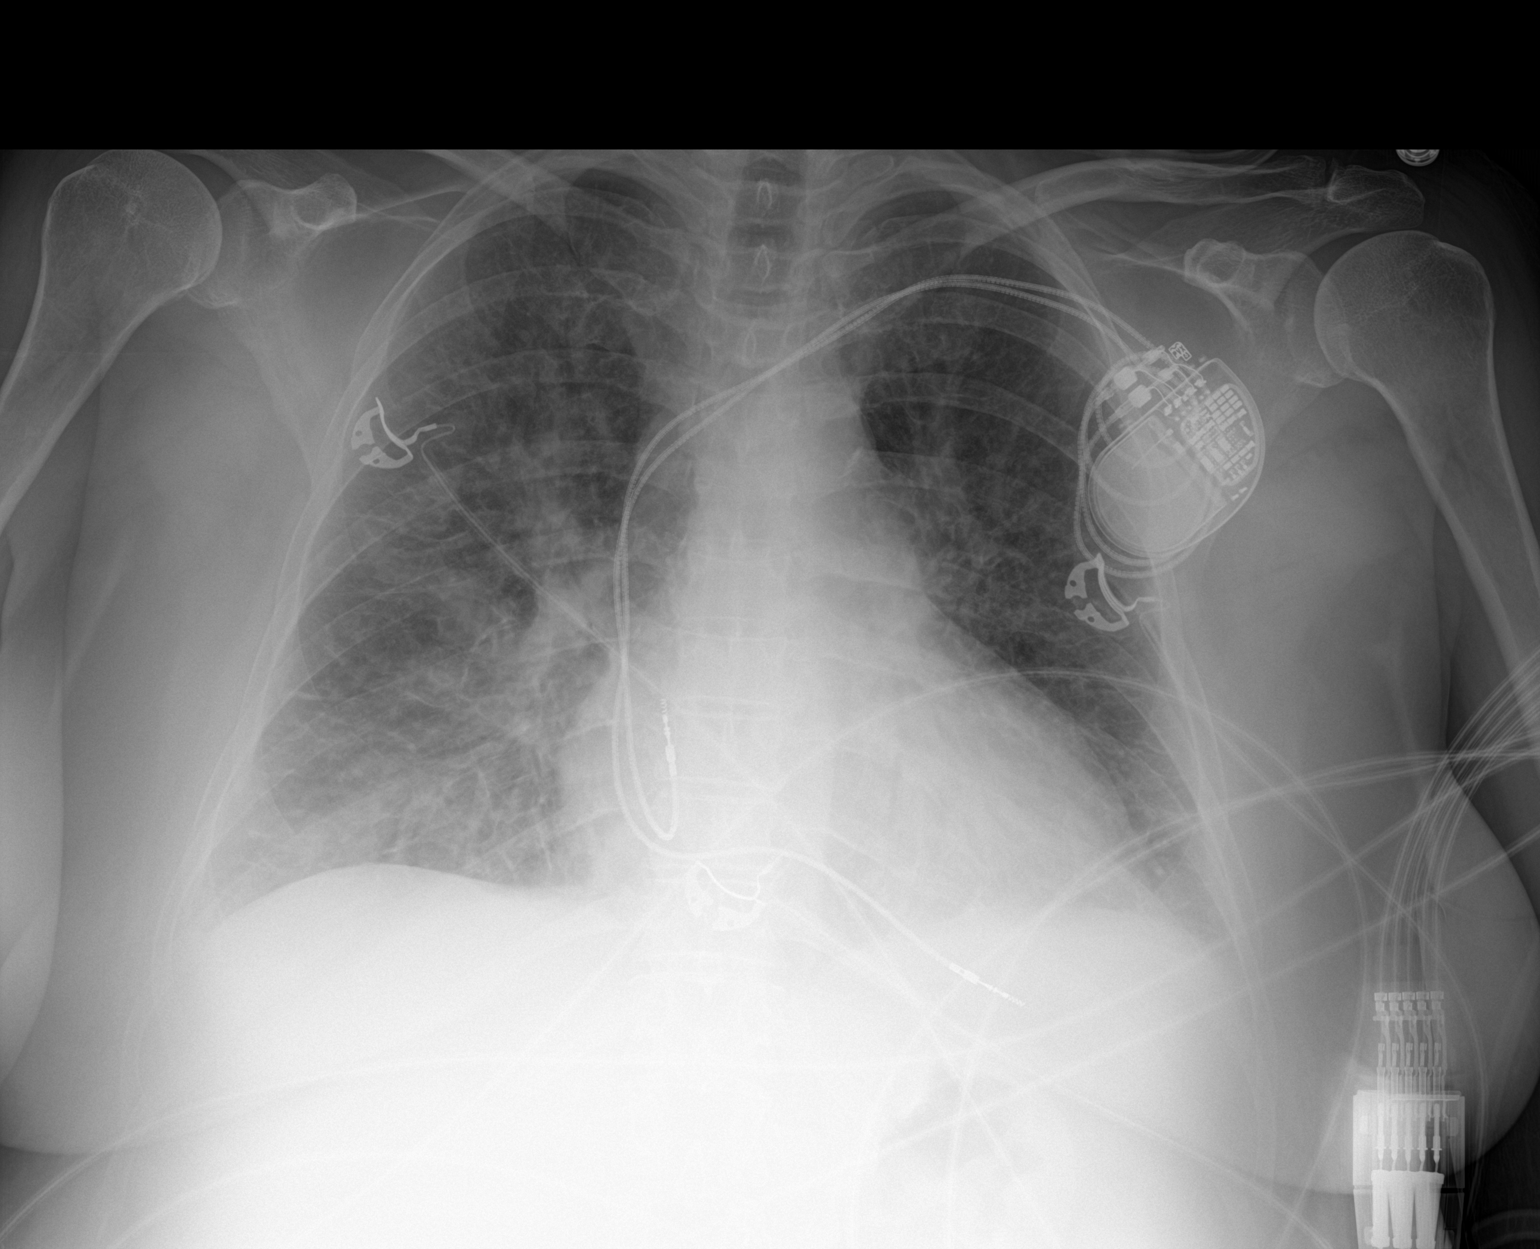

[1 of 1 positions shown; findings below may reference images not displayed]

FINDINGS: There is borderline cardiomegaly with vascular congestion and mild
edema. Pneumonia is not excluded. Clinical correlation is
recommended. Trace pleural effusions may be present. No
pneumothorax. Left pectoral pacemaker device. No acute osseous
pathology.
IMPRESSION: Borderline cardiomegaly with findings of CHF. Pneumonia is not
excluded.

## 2022-06-05 NOTE — Progress Notes (Signed)
PERIOPERATIVE PRESCRIPTION FOR IMPLANTED CARDIAC DEVICE PROGRAMMING  Patient Information: Name:  RIANNON MUKHERJEE  DOB:  1961-04-04  MRN:  [id number]    anned Procedure: colonoscopy with propofol endoscopic mucosal resection  Surgeon:  Dr. Justice Britain  Date of Procedure:  06-11-22  Cautery will be used.  Position during surgery: N/A   Please send documentation back to:  [facility] (Fax # [phone])   Device Information:  Clinic EP Physician:  Virl Axe, MD   Device Type:  Pacemaker Manufacturer and Phone #:  Medtronic: [id number] Pacemaker Dependent?:  Yes.   Date of Last Device Check:  04/02/22 Remote Normal Device Function?:  Yes.   Electrophysiologist's Recommendations:  Have magnet available. Provide continuous ECG monitoring when magnet is used or reprogramming is to be performed.  Procedure may interfere with device function.  Magnet should be placed over device during procedure.  Per Device Clinic Standing Orders, Wanda Plump, RN  4:00 PM 06/05/2022

## 2022-06-06 IMAGING — DX DG CHEST 1V PORT
1 series · 1 of 1 positions shown · non-contrast
Comparison: 06/20/2020

CLINICAL DATA: Hypoxia.  Recent congestion.

EXAM:
PORTABLE CHEST 1 VIEW

[chest ap]
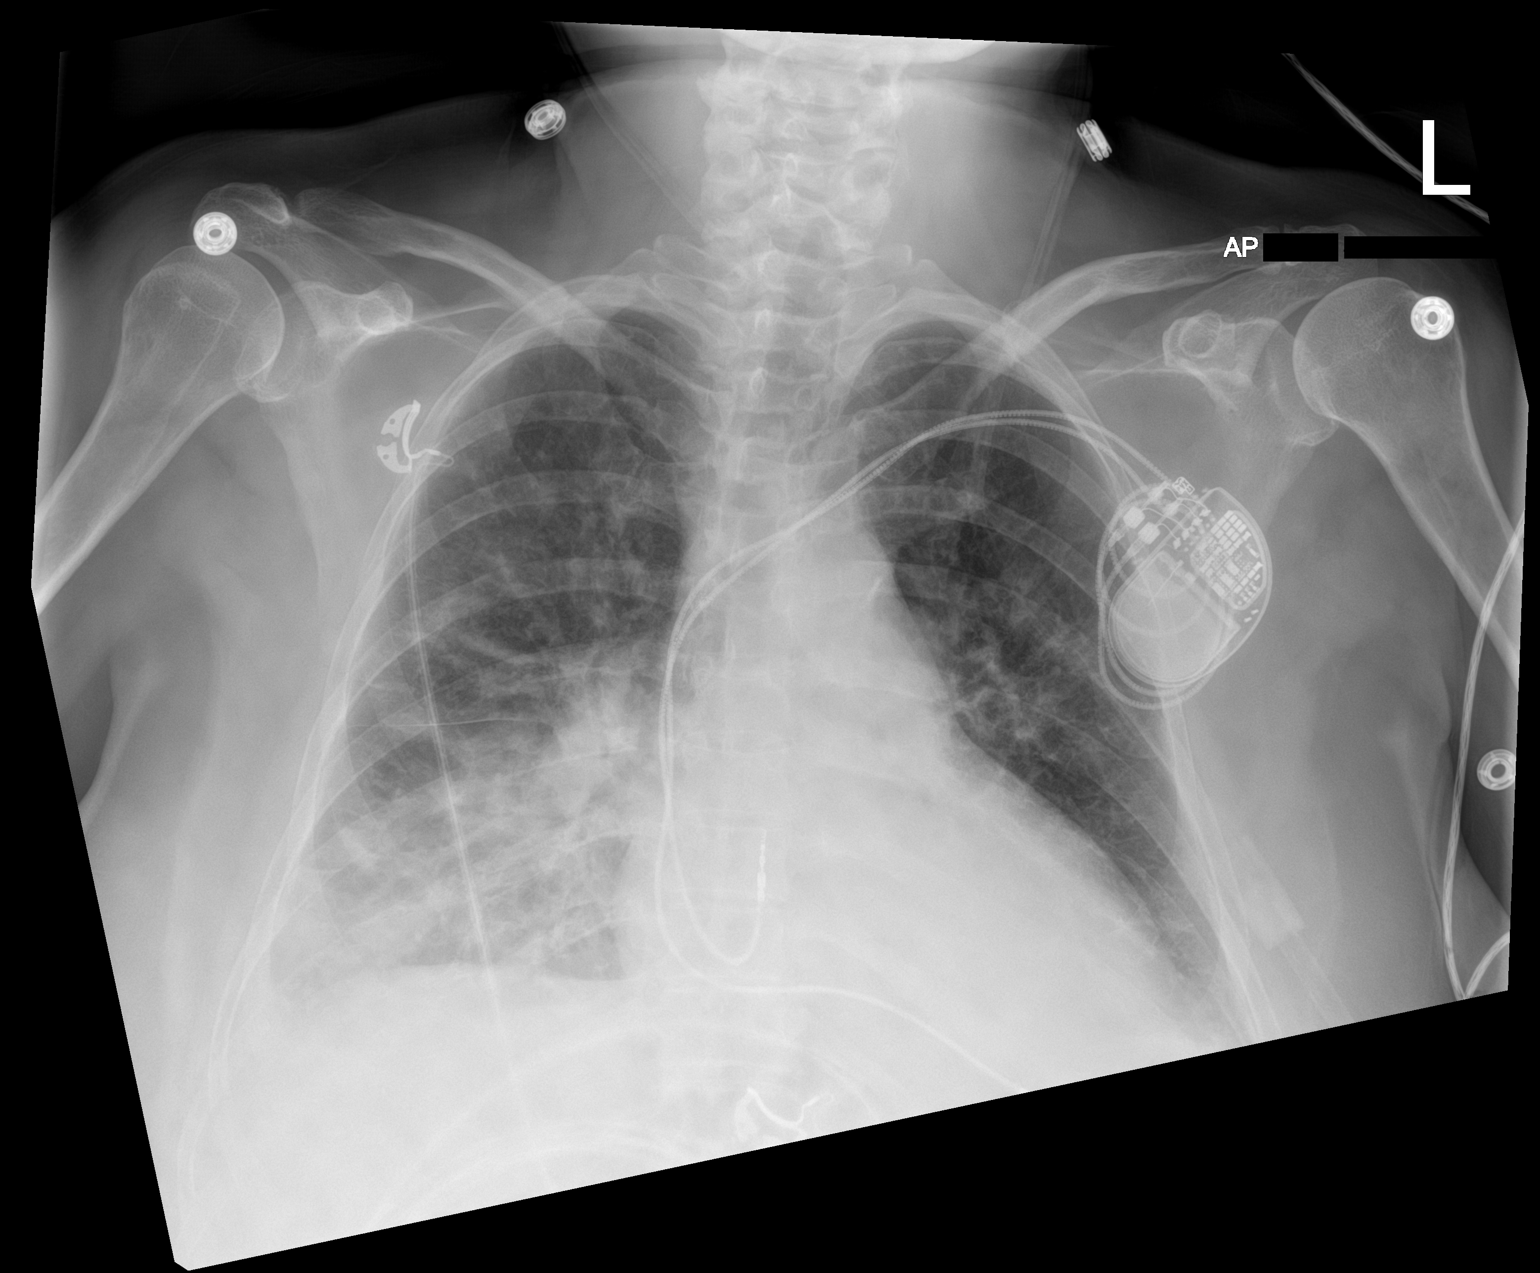

[1 of 1 positions shown; findings below may reference images not displayed]

FINDINGS: Left chest wall pacer device is noted with leads in the right atrial
appendage and right ventricle. The heart size is normal. Small
pleural effusions and pulmonary vascular congestion is again noted.
Interval increased opacification of the right lower lobe.
IMPRESSION: 1. Stable small bilateral pleural effusions and pulmonary vascular
congestion.
2. Worsening aeration to the right lower lobe.

## 2022-06-11 ENCOUNTER — Ambulatory Visit (HOSPITAL_COMMUNITY)
Admission: RE | Admit: 2022-06-11 | Discharge: 2022-06-11 | Disposition: A | Discharge status: Home / Self Care | Payer: Medicare HMO | Attending: Gastroenterology | Admitting: Gastroenterology

## 2022-06-11 ENCOUNTER — Other Ambulatory Visit: Payer: Self-pay

## 2022-06-11 ENCOUNTER — Encounter (HOSPITAL_COMMUNITY)
Admission: RE | Disposition: A | Discharge status: Home / Self Care | Payer: Self-pay | Source: Home / Self Care | Attending: Gastroenterology

## 2022-06-11 ENCOUNTER — Encounter (HOSPITAL_COMMUNITY): Payer: Self-pay | Admitting: Gastroenterology

## 2022-06-11 ENCOUNTER — Ambulatory Visit (HOSPITAL_COMMUNITY): Payer: Medicare HMO | Admitting: Certified Registered Nurse Anesthetist

## 2022-06-11 ENCOUNTER — Ambulatory Visit (HOSPITAL_BASED_OUTPATIENT_CLINIC_OR_DEPARTMENT_OTHER): Payer: Medicare HMO | Admitting: Certified Registered Nurse Anesthetist

## 2022-06-11 DIAGNOSIS — Z8249 Family history of ischemic heart disease and other diseases of the circulatory system: Secondary | ICD-10-CM | POA: Insufficient documentation

## 2022-06-11 DIAGNOSIS — F17210 Nicotine dependence, cigarettes, uncomplicated: Secondary | ICD-10-CM

## 2022-06-11 DIAGNOSIS — I110 Hypertensive heart disease with heart failure: Secondary | ICD-10-CM | POA: Diagnosis not present

## 2022-06-11 DIAGNOSIS — G40409 Other generalized epilepsy and epileptic syndromes, not intractable, without status epilepticus: Secondary | ICD-10-CM | POA: Diagnosis not present

## 2022-06-11 DIAGNOSIS — D128 Benign neoplasm of rectum: Secondary | ICD-10-CM

## 2022-06-11 DIAGNOSIS — K635 Polyp of colon: Secondary | ICD-10-CM | POA: Diagnosis not present

## 2022-06-11 DIAGNOSIS — K641 Second degree hemorrhoids: Secondary | ICD-10-CM

## 2022-06-11 DIAGNOSIS — Z86010 Personal history of colonic polyps: Secondary | ICD-10-CM

## 2022-06-11 DIAGNOSIS — F17200 Nicotine dependence, unspecified, uncomplicated: Secondary | ICD-10-CM | POA: Diagnosis not present

## 2022-06-11 DIAGNOSIS — I509 Heart failure, unspecified: Secondary | ICD-10-CM | POA: Insufficient documentation

## 2022-06-11 DIAGNOSIS — G4730 Sleep apnea, unspecified: Secondary | ICD-10-CM | POA: Insufficient documentation

## 2022-06-11 DIAGNOSIS — I10 Essential (primary) hypertension: Secondary | ICD-10-CM

## 2022-06-11 DIAGNOSIS — Z1211 Encounter for screening for malignant neoplasm of colon: Secondary | ICD-10-CM | POA: Insufficient documentation

## 2022-06-11 DIAGNOSIS — D123 Benign neoplasm of transverse colon: Secondary | ICD-10-CM

## 2022-06-11 DIAGNOSIS — K6389 Other specified diseases of intestine: Secondary | ICD-10-CM

## 2022-06-11 DIAGNOSIS — D120 Benign neoplasm of cecum: Secondary | ICD-10-CM | POA: Diagnosis not present

## 2022-06-11 DIAGNOSIS — Z09 Encounter for follow-up examination after completed treatment for conditions other than malignant neoplasm: Secondary | ICD-10-CM

## 2022-06-11 DIAGNOSIS — K5730 Diverticulosis of large intestine without perforation or abscess without bleeding: Secondary | ICD-10-CM | POA: Insufficient documentation

## 2022-06-11 DIAGNOSIS — K219 Gastro-esophageal reflux disease without esophagitis: Secondary | ICD-10-CM | POA: Insufficient documentation

## 2022-06-11 DIAGNOSIS — Z950 Presence of cardiac pacemaker: Secondary | ICD-10-CM | POA: Insufficient documentation

## 2022-06-11 DIAGNOSIS — Z83719 Family history of colon polyps, unspecified: Secondary | ICD-10-CM | POA: Diagnosis not present

## 2022-06-11 DIAGNOSIS — Z9889 Other specified postprocedural states: Secondary | ICD-10-CM | POA: Diagnosis not present

## 2022-06-11 HISTORY — PX: POLYPECTOMY: SHX5525

## 2022-06-11 HISTORY — PX: SUBMUCOSAL LIFTING INJECTION: SHX6855

## 2022-06-11 HISTORY — DX: Essential (primary) hypertension: I10

## 2022-06-11 HISTORY — DX: Heart failure, unspecified: I50.9

## 2022-06-11 HISTORY — PX: ENDOSCOPIC MUCOSAL RESECTION: SHX6839

## 2022-06-11 HISTORY — PX: COLONOSCOPY WITH PROPOFOL: SHX5780

## 2022-06-11 HISTORY — DX: Chronic cough: R05.3

## 2022-06-11 HISTORY — PX: HEMOSTASIS CLIP PLACEMENT: SHX6857

## 2022-06-11 SURGERY — COLONOSCOPY WITH PROPOFOL
Anesthesia: Monitor Anesthesia Care

## 2022-06-11 MED ORDER — PROPOFOL 1000 MG/100ML IV EMUL
INTRAVENOUS | Status: AC
  Filled 2022-06-11: qty 100

## 2022-06-11 MED ORDER — PROPOFOL 500 MG/50ML IV EMUL
INTRAVENOUS | Status: DC | PRN
  Administered 2022-06-11: 150 ug/kg/min via INTRAVENOUS

## 2022-06-11 MED ORDER — SODIUM CHLORIDE 0.9 % IV SOLN: INTRAVENOUS | Status: DC

## 2022-06-11 MED ORDER — PROPOFOL 500 MG/50ML IV EMUL
INTRAVENOUS | Status: AC
  Filled 2022-06-11: qty 50

## 2022-06-11 MED ORDER — LACTATED RINGERS IV SOLN: INTRAVENOUS | Status: DC

## 2022-06-11 SURGICAL SUPPLY — 22 items

## 2022-06-11 NOTE — Transfer of Care (Signed)
Immediate Anesthesia Transfer of Care Note  Patient: [name] [name]  Procedure(s) Performed: COLONOSCOPY WITH PROPOFOL ENDOSCOPIC MUCOSAL RESECTION SUBMUCOSAL LIFTING INJECTION BIOPSY HEMOSTASIS CLIP PLACEMENT POLYPECTOMY  Patient Location: PACU  Anesthesia Type:MAC  Level of Consciousness: drowsy  Airway & Oxygen Therapy: Patient Spontanous Breathing and Patient connected to face mask oxygen  Post-op Assessment: Report given to RN, Post -op Vital signs reviewed and stable, and Patient moving all extremities X 4  Post vital signs: Reviewed and stable  Last Vitals:  Vitals Value Taken Time  BP 129/76   Temp    Pulse 60   Resp 12   SpO2 100     Last Pain:  Vitals:   06/11/22 0652  TempSrc: Temporal  PainSc: 0-No pain      Patients Stated Pain Goal: 3 (23/95/32 0233)  Complications: No notable events documented.

## 2022-06-11 NOTE — Anesthesia Preprocedure Evaluation (Signed)
Anesthesia Evaluation  Patient identified by MRN, date of birth, ID band Patient awake    Reviewed: Allergy & Precautions, H&P , NPO status , Patient's Chart, lab work & pertinent test results  Airway Mallampati: III  TM Distance: <3 FB Neck ROM: Full    Dental no notable dental hx.    Pulmonary sleep apnea , Current Smoker and Patient abstained from smoking.   Pulmonary exam normal breath sounds clear to auscultation       Cardiovascular hypertension, Normal cardiovascular exam+ pacemaker  Rhythm:Regular Rate:Normal     Neuro/Psych Seizures -, Well Controlled,   negative psych ROS   GI/Hepatic Neg liver ROS,GERD  ,,  Endo/Other  negative endocrine ROS    Renal/GU negative Renal ROS  negative genitourinary   Musculoskeletal negative musculoskeletal ROS (+)    Abdominal   Peds negative pediatric ROS (+)  Hematology negative hematology ROS (+)   Anesthesia Other Findings   Reproductive/Obstetrics negative OB ROS                             Anesthesia Physical Anesthesia Plan  ASA: 3  Anesthesia Plan: MAC   Post-op Pain Management: Minimal or no pain anticipated   Induction: Intravenous  PONV Risk Score and Plan: 2 and Propofol infusion and Treatment may vary due to age or medical condition  Airway Management Planned: Simple Face Mask  Additional Equipment:   Intra-op Plan:   Post-operative Plan:   Informed Consent: I have reviewed the patients History and Physical, chart, labs and discussed the procedure including the risks, benefits and alternatives for the proposed anesthesia with the patient or authorized representative who has indicated his/her understanding and acceptance.     Dental advisory given  Plan Discussed with: CRNA and Surgeon  Anesthesia Plan Comments:        Anesthesia Quick Evaluation

## 2022-06-11 NOTE — Anesthesia Procedure Notes (Signed)
Procedure Name: MAC Date/Time: 06/11/2022 7:44 AM  Performed by: Maxwell Caul, CRNAPre-anesthesia Checklist: Patient identified, Emergency Drugs available, Suction available and Patient being monitored Oxygen Delivery Method: Simple face mask

## 2022-06-11 NOTE — H&P (Signed)
GASTROENTEROLOGY PROCEDURE H&P NOTE   Primary Care Physician: Marlen Jo, PA  HPI: [name] [name] is a 62 y.o. female who presents for Colonoscopy for followup/surveillance of previous appendiceal orifice/cecal polyp.  Past Medical History:  Diagnosis Date   Arthritis    CHF (congestive heart failure) (HCC)    Chronic cough    Chronic insomnia 03/07/2015   Diverticulitis    GERD (gastroesophageal reflux disease)    Gout    Hiatal hernia    Hypertension    LBBB (left bundle branch block)    Pacemaker    Medtronic due to heart block   Panic disorder 09/07/2014   Pneumonia    x 1 - Walking   Seizures ([place])    Last Seizure in 2017 - controlled with meds    Sleep apnea    does not use a CPAP due to grand mal seizure   Wrist tendonitis    R    Past Surgical History:  Procedure Laterality Date   COLONOSCOPY  last 03/31/2016   COLONOSCOPY WITH PROPOFOL N/A 04/04/2019   Procedure: COLONOSCOPY WITH PROPOFOL;  Surgeon: Mauri Pole, MD;  Location: [facility];  Service: Endoscopy;  Laterality: N/A;   COLONOSCOPY WITH PROPOFOL N/A 06/15/2019   Procedure: COLONOSCOPY WITH PROPOFOL;  Surgeon: Rush Landmark Telford Nab., MD;  Location: [place];  Service: Gastroenterology;  Laterality: N/A;   COLONOSCOPY WITH PROPOFOL N/A 02/28/2020   Procedure: COLONOSCOPY WITH PROPOFOL;  Surgeon: Rush Landmark Telford Nab., MD;  Location: [facility];  Service: Gastroenterology;  Laterality: N/A;   ENDOROTOR  02/28/2020   Procedure: [id number];  Surgeon: Mansouraty, Telford Nab., MD;  Location: [facility];  Service: Gastroenterology;;   ENDOSCOPIC MUCOSAL RESECTION N/A 06/15/2019   Procedure: ENDOSCOPIC MUCOSAL RESECTION;  Surgeon: Irving Copas., MD;  Location: [place];  Service: Gastroenterology;  Laterality: N/A;   ENDOSCOPIC MUCOSAL RESECTION N/A 02/28/2020   Procedure: ENDOSCOPIC MUCOSAL RESECTION;  Surgeon: Rush Landmark Telford Nab., MD;  Location: [facility];   Service: Gastroenterology;  Laterality: N/A;   EP IMPLANTABLE DEVICE N/A 12/13/2014   Procedure: Pacemaker Implant;  Surgeon: Deboraha Sprang, MD;  Location: [facility];  Service: Cardiovascular;  Laterality: N/A;   HAND SURGERY     HEMOSTASIS CLIP PLACEMENT  06/15/2019   Procedure: HEMOSTASIS CLIP PLACEMENT;  Surgeon: Irving Copas., MD;  Location: [place];  Service: Gastroenterology;;   HEMOSTASIS CLIP PLACEMENT  02/28/2020   Procedure: HEMOSTASIS CLIP PLACEMENT;  Surgeon: Irving Copas., MD;  Location: [facility];  Service: Gastroenterology;;   HEMOSTASIS CONTROL  02/28/2020   Procedure: HEMOSTASIS CONTROL;  Surgeon: Irving Copas., MD;  Location: [facility];  Service: Gastroenterology;;   POLYPECTOMY     POLYPECTOMY  04/04/2019   Procedure: POLYPECTOMY;  Surgeon: Mauri Pole, MD;  Location: [facility];  Service: Endoscopy;;   POLYPECTOMY  06/15/2019   Procedure: POLYPECTOMY;  Surgeon: Irving Copas., MD;  Location: [place];  Service: Gastroenterology;;   SKIN CANCER EXCISION     SUBMUCOSAL LIFTING INJECTION  06/15/2019   Procedure: SUBMUCOSAL LIFTING INJECTION;  Surgeon: Irving Copas., MD;  [facility]: [facility];  Service: Gastroenterology;;   TUBAL LIGATION     UPPER GI ENDOSCOPY     WISDOM TOOTH EXTRACTION     Current Facility-Administered Medications  Medication Dose Route Frequency Provider Last Rate Last Admin   0.9 %  sodium chloride infusion   Intravenous Continuous Mansouraty, Telford Nab., MD       lactated ringers infusion  Intravenous Continuous Mansouraty, Telford Nab., MD 10 mL/hr at 06/11/22 0713 New Bag at 06/11/22 0713    Current Facility-Administered Medications:    0.9 %  sodium chloride infusion, , Intravenous, Continuous, Mansouraty, Telford Nab., MD   lactated ringers infusion, , Intravenous, Continuous, Mansouraty, Telford Nab., MD, Last Rate: 10 mL/hr at 06/11/22 0713, New Bag at  06/11/22 0713 Allergies  Allergen Reactions   Zonisamide Anxiety    Panic Attacks   Aspirin Hives    Upset stomach   Ciprofloxacin     Seizures & it keeps her awake   Codeine Hives   Hydrocodone Hives   Oxycodone Hives   Penicillins Hives and Swelling    Did it involve swelling of the face/tongue/throat, SOB, or low BP? Yes Did it involve sudden or severe rash/hives, skin peeling, or any reaction on the inside of your mouth or nose? Yes Did you need to seek medical attention at a hospital or doctor's office? No When did it last happen? 1990's   If all above answers are "NO", may proceed with cephalosporin use. Pt tolerated ampicillin/sulbactam without issues 06/2020   Family History  Problem Relation Age of Onset   Allergic rhinitis Mother    Alzheimer's disease Mother    Hypertension Mother    Colon polyps Mother    Allergic rhinitis Father    Hypertension Father    Stroke Father    Leukemia Father    Prostate cancer Father    Colon polyps Father    Allergic rhinitis Sister    Colon polyps Sister    Allergic rhinitis Sister    Kidney failure Sister    Diabetes Sister    Colon polyps Sister    Allergic rhinitis Brother    Stroke Brother    Hypertension Brother    Sleep apnea Brother    Colon polyps Brother    Colon cancer Paternal Aunt    Colon cancer Paternal Uncle    Parkinson's disease Maternal Grandmother    Coronary artery disease Maternal Grandfather    Esophageal cancer Neg Hx    Stomach cancer Neg Hx    Rectal cancer Neg Hx    Inflammatory bowel disease Neg Hx    Liver disease Neg Hx    Pancreatic cancer Neg Hx    Seizures Neg Hx    Social History   Socioeconomic History   Marital status: Single    Spouse name: Not on file   Number of children: 0   Years of education: 14   Highest education level: Not on file  Occupational History   Not on file  Tobacco Use   Smoking status: Some Days    Packs/day: 0.25    Years: 45.00    Total pack years:  11.25    Types: Cigarettes    Start date: 1977    Passive exposure: Current   Smokeless tobacco: Never   Tobacco comments:    Pt states she smokes 2 cigs a day and sometime 4 a week  Vaping Use   Vaping Use: Never used  Substance and Sexual Activity   Alcohol use: Not Currently    Comment: Quit 12/07/2015   Drug use: Never   Sexual activity: Not Currently    Birth control/protection: Post-menopausal  Other Topics Concern   Not on file  Social History Narrative   Lives with sister   Left handed   Drinks 1-2 cups caffeine daily   Social Determinants of Health   Financial Resource Strain: Not  on file  Food Insecurity: Not on file  Transportation Needs: Not on file  Physical Activity: Not on file  Stress: Not on file  Social Connections: Not on file  Intimate Partner Violence: Not on file    Physical Exam: Today's Vitals   06/11/22 0652  BP: (!) 147/59  Pulse: 60  Resp: 11  Temp: 98.4 F (36.9 C)  TempSrc: Temporal  SpO2: 97%  Weight: 79.8 kg  Height: '5\' 1"'$  (1.549 m)  PainSc: 0-No pain   Body mass index is 33.25 kg/m. GEN: NAD EYE: Sclerae anicteric ENT: MMM CV: Non-tachycardic GI: Soft, NT/ND NEURO:  Alert & Oriented x 3  Lab Results: No results for input(s): "WBC", "HGB", "HCT", "PLT" in the last 72 hours. BMET No results for input(s): "NA", "K", "CL", "CO2", "GLUCOSE", "BUN", "CREATININE", "CALCIUM" in the last 72 hours. LFT No results for input(s): "PROT", "ALBUMIN", "AST", "ALT", "ALKPHOS", "BILITOT", "BILIDIR", "IBILI" in the last 72 hours. PT/INR No results for input(s): "LABPROT", "INR" in the last 72 hours.   Impression / Plan: This is a 62 y.o.female who presents for Colonoscopy for followup/surveillance of previous appendiceal orifice/cecal polyp.  The risks and benefits of endoscopic evaluation/treatment were discussed with the patient and/or family; these include but are not limited to the risk of perforation, infection, bleeding, missed  lesions, lack of diagnosis, severe illness requiring hospitalization, as well as anesthesia and sedation related illnesses.  The patient's history has been reviewed, patient examined, no change in status, and deemed stable for procedure.  The patient and/or family is agreeable to proceed.    Justice Britain, MD [facility] Advanced [facility]oscopy Office # [phone]

## 2022-06-11 NOTE — Op Note (Signed)
[facility] Patient Name: [name] [name] Procedure Date: 06/11/2022 MRN: [id number] Attending MD: Justice Britain , MD, [id number] Date of Birth: 04-Apr-1961 CSN: [id number] Age: 62 Admit Type: Outpatient Procedure:                Colonoscopy Indications:              Surveillance: History of piecemeal removal adenoma                            on last colonoscopy (< 3 yrs) Providers:                Justice Britain, MD, Jeanella Cara, RN,                            Gloris Ham, Technician Referring MD:             Mauri Pole, MD Medicines:                Monitored Anesthesia Care Complications:            No immediate complications. Estimated Blood Loss:     Estimated blood loss was minimal. Procedure:                Pre-Anesthesia Assessment:                           - Prior to the procedure, a History and Physical                            was performed, and patient medications and                            allergies were reviewed. The patient's tolerance of                            previous anesthesia was also reviewed. The risks                            and benefits of the procedure and the sedation                            options and risks were discussed with the patient.                            All questions were answered, and informed consent                            was obtained. Prior Anticoagulants: The patient has                            taken no anticoagulant or antiplatelet agents. ASA                            Grade Assessment: III - A patient with severe  systemic disease. After reviewing the risks and                            benefits, the patient was deemed in satisfactory                            condition to undergo the procedure.                           After obtaining informed consent, the colonoscope                            was passed under direct vision. Throughout  the                            procedure, the patient's blood pressure, pulse, and                            oxygen saturations were monitored continuously. The                            CF-HQ190L ([id number]) Olympus colonoscope was                            introduced through the anus and advanced to the 3                            cm into the ileum. The colonoscopy was performed                            without difficulty. The patient tolerated the                            procedure. The quality of the bowel preparation was                            adequate. The terminal ileum, ileocecal valve,                            appendiceal orifice, and rectum were photographed. Scope In: 7:54:02 AM Scope Out: 8:29:29 AM Scope Withdrawal Time: 0 hours 30 minutes 46 seconds  Total Procedure Duration: 0 hours 35 minutes 27 seconds  Findings:      The digital rectal exam findings include hemorrhoids. Pertinent       negatives include no palpable rectal lesions.      The terminal ileum and ileocecal valve appeared normal.      A large post mucosectomy scar was found in the cecum and at the       peri-appendiceal orifice regions from previous mucosectomy and       morcellation. There was evidence of recurrent polypoid tissue       (approximately 8 mm by 3 mm in size. Preparations were made for further       mucosal resection. Demarcation of the lesion was performed with       high-definition white light and  narrow band imaging to clearly identify       the boundaries of the lesion. EverLift was injected to raise the lesion       partially (there was dense fibrosis in the region). Snare mucosal       resection was performed. Resection and retrieval were complete. Resected       tissue margins were examined and clear of polyp tissue. To further aid       in decreasing recurrence, fulguration to ablate the lesion margin by hot       forceps avulsion was performed and successful. To prevent  bleeding       post-intervention, three hemostatic clips were successfully placed (MR       conditional). Clip manufacturer: [employer]. There was no       bleeding at the end of the procedure.      Four sessile polyps were found in the rectum (3) and transverse colon       (1). The polyps were 2 to 6 mm in size. These polyps were removed with a       cold snare. Resection and retrieval were complete.      Multiple small-mouthed diverticula were found in the recto-sigmoid colon       and sigmoid colon.      Normal mucosa was found in the entire colon otherwise.      Non-bleeding non-thrombosed internal hemorrhoids were found during       retroflexion, during perianal exam and during digital exam. The       hemorrhoids were Grade II (internal hemorrhoids that prolapse but reduce       spontaneously). Impression:               - Hemorrhoids found on digital rectal exam.                           - The examined portion of the ileum was normal.                           - Post mucosectomy scar in the cecum and at the                            peri-appendiceal orifice region. Small recurrence                            noted. EMR performed then margin treated with hot                            forcep avulsion. Clips (MR conditional) were                            placed. Clip manufacturer: [employer].                           - Four 2 to 6 mm polyps in the rectum and in the                            transverse colon, removed with a cold snare.  Resected and retrieved.                           - Diverticulosis in the recto-sigmoid colon and in                            the sigmoid colon.                           - Normal mucosa in the entire examined colon                            otherwise.                           - Non-bleeding non-thrombosed internal hemorrhoids. Moderate Sedation:      Not Applicable - Patient had care per  Anesthesia. Recommendation:           - The patient will be observed post-procedure,                            until all discharge criteria are met.                           - Discharge patient to home.                           - Patient has a contact number available for                            emergencies. The signs and symptoms of potential                            delayed complications were discussed with the                            patient. Return to normal activities tomorrow.                            Written discharge instructions were provided to the                            patient.                           - High fiber diet.                           - Use FiberCon 1-2 tablets PO daily.                           - No aspirin, ibuprofen, naproxen, or other                            non-steroidal anti-inflammatory drugs for 1 week  after polyp removal.                           - Await pathology results.                           - Repeat colonoscopy within 1 year for                            surveillance, if recurrence is present then, will                            likely need FTRD.                           - The findings and recommendations were discussed                            with the patient.                           - The findings and recommendations were discussed                            with the patient's family. Procedure Code(s):        --- Professional ---                           [phone], Colonoscopy, flexible; with endoscopic                            mucosal resection                           45385, 44, Colonoscopy, flexible; with removal of                            tumor(s), polyp(s), or other lesion(s) by snare                            technique Diagnosis Code(s):        --- Professional ---                           Z86.010, Personal history of colonic polyps                           K64.1, Second  degree hemorrhoids                           Z98.890, Other specified postprocedural states                           D12.8, Benign neoplasm of rectum                           D12.3, Benign neoplasm of transverse colon (hepatic  flexure or splenic flexure)                           K57.30, Diverticulosis of large intestine without                            perforation or abscess without bleeding CPT copyright 2022 American Medical Association. All rights reserved. The codes documented in this report are preliminary and upon coder review may  be revised to meet current compliance requirements. Justice Britain, MD 06/11/2022 8:57:27 AM Number of Addenda: 0

## 2022-06-11 NOTE — Anesthesia Postprocedure Evaluation (Signed)
Anesthesia Post Note  Patient: [name] [name]  Procedure(s) Performed: COLONOSCOPY WITH PROPOFOL ENDOSCOPIC MUCOSAL RESECTION SUBMUCOSAL LIFTING INJECTION BIOPSY HEMOSTASIS CLIP PLACEMENT POLYPECTOMY     Patient location during evaluation: PACU Anesthesia Type: MAC Level of consciousness: awake and alert Pain management: pain level controlled Vital Signs Assessment: post-procedure vital signs reviewed and stable Respiratory status: spontaneous breathing, nonlabored ventilation, respiratory function stable and patient connected to nasal cannula oxygen Cardiovascular status: stable and blood pressure returned to baseline Postop Assessment: no apparent nausea or vomiting Anesthetic complications: no  No notable events documented.  Last Vitals:  Vitals:   06/11/22 0855 06/11/22 0901  BP:  128/70  Pulse: 70   Resp:    Temp:    SpO2: 95%     Last Pain:  Vitals:   06/11/22 0901  TempSrc:   PainSc: 0-No pain                 Ibeth Fahmy S

## 2022-06-11 NOTE — Discharge Instructions (Signed)
YOU HAD AN ENDOSCOPIC PROCEDURE TODAY: Refer to the procedure report and other information in the discharge instructions given to you for any specific questions about what was found during the examination. If this information does not answer your questions, please call [place] office at [phone] to clarify.   YOU SHOULD EXPECT: Some feelings of bloating in the abdomen. Passage of more gas than usual. Walking can help get rid of the air that was put into your GI tract during the procedure and reduce the bloating. If you had a lower endoscopy (such as a colonoscopy or flexible sigmoidoscopy) you may notice spotting of blood in your stool or on the toilet paper. Some abdominal soreness may be present for a day or two, also.  DIET: Your first meal following the procedure should be a light meal and then it is ok to progress to your normal diet. A half-sandwich or bowl of soup is an example of a good first meal. Heavy or fried foods are harder to digest and may make you feel nauseous or bloated. Drink plenty of fluids but you should avoid alcoholic beverages for 24 hours. If you had a esophageal dilation, please see attached instructions for diet.    ACTIVITY: Your care partner should take you home directly after the procedure. You should plan to take it easy, moving slowly for the rest of the day. You can resume normal activity the day after the procedure however YOU SHOULD NOT DRIVE, use power tools, machinery or perform tasks that involve climbing or major physical exertion for 24 hours (because of the sedation medicines used during the test).   SYMPTOMS TO REPORT IMMEDIATELY: A gastroenterologist can be reached at any hour. Please call [phone]  for any of the following symptoms:  Following lower endoscopy (colonoscopy, flexible sigmoidoscopy) Excessive amounts of blood in the stool  Significant tenderness, worsening of abdominal pains  Swelling of the abdomen that is new, acute  Fever of 100 or  higher  Following upper endoscopy (EGD, EUS, ERCP, esophageal dilation) Vomiting of blood or coffee ground material  New, significant abdominal pain  New, significant chest pain or pain under the shoulder blades  Painful or persistently difficult swallowing  New shortness of breath  Black, tarry-looking or red, bloody stools  FOLLOW UP:  If any biopsies were taken you will be contacted by phone or by letter within the next 1-3 weeks. Call [phone]  if you have not heard about the biopsies in 3 weeks.  Please also call with any specific questions about appointments or follow up tests.YOU HAD AN ENDOSCOPIC PROCEDURE TODAY: Refer to the procedure report and other information in the discharge instructions given to you for any specific questions about what was found during the examination. If this information does not answer your questions, please call [place] office at [phone] to clarify.   YOU SHOULD EXPECT: Some feelings of bloating in the abdomen. Passage of more gas than usual. Walking can help get rid of the air that was put into your GI tract during the procedure and reduce the bloating. If you had a lower endoscopy (such as a colonoscopy or flexible sigmoidoscopy) you may notice spotting of blood in your stool or on the toilet paper. Some abdominal soreness may be present for a day or two, also.  DIET: Your first meal following the procedure should be a light meal and then it is ok to progress to your normal diet. A half-sandwich or bowl of soup is an example of a   good first meal. Heavy or fried foods are harder to digest and may make you feel nauseous or bloated. Drink plenty of fluids but you should avoid alcoholic beverages for 24 hours. If you had a esophageal dilation, please see attached instructions for diet.    ACTIVITY: Your care partner should take you home directly after the procedure. You should plan to take it easy, moving slowly for the rest of the day. You can resume  normal activity the day after the procedure however YOU SHOULD NOT DRIVE, use power tools, machinery or perform tasks that involve climbing or major physical exertion for 24 hours (because of the sedation medicines used during the test).   SYMPTOMS TO REPORT IMMEDIATELY: A gastroenterologist can be reached at any hour. Please call [phone]  for any of the following symptoms:  Following lower endoscopy (colonoscopy, flexible sigmoidoscopy) Excessive amounts of blood in the stool  Significant tenderness, worsening of abdominal pains  Swelling of the abdomen that is new, acute  Fever of 100 or higher  Following upper endoscopy (EGD, EUS, ERCP, esophageal dilation) Vomiting of blood or coffee ground material  New, significant abdominal pain  New, significant chest pain or pain under the shoulder blades  Painful or persistently difficult swallowing  New shortness of breath  Black, tarry-looking or red, bloody stools  FOLLOW UP:  If any biopsies were taken you will be contacted by phone or by letter within the next 1-3 weeks. Call [phone]  if you have not heard about the biopsies in 3 weeks.  Please also call with any specific questions about appointments or follow up tests. 

## 2022-06-12 LAB — SURGICAL PATHOLOGY

## 2022-06-13 ENCOUNTER — Encounter: Payer: Self-pay | Admitting: Gastroenterology

## 2022-06-15 ENCOUNTER — Encounter (HOSPITAL_COMMUNITY): Payer: Self-pay | Admitting: Gastroenterology

## 2022-06-15 ENCOUNTER — Telehealth: Payer: Self-pay | Admitting: Physician Assistant

## 2022-06-15 NOTE — Telephone Encounter (Signed)
Valsartan  160 mg daily was started at office visit on 04/29/22 with [name] Pao, NP.  ED note from 05/07/22 indicates Valsartan was increased to 320 mg daily by PCP on 11/29.  I asked patient to contact PCP for refill as they made most recent change in her medication

## 2022-06-15 NOTE — Telephone Encounter (Signed)
Pt c/o medication issue:  1. Name of Medication: valsartan (DIOVAN) 160 MG tablet   2. How are you currently taking this medication (dosage and times per day)? Take 1 tablet (160 mg total) by mouth daily. Patient taking differently: Take 320 mg by mouth daily.   3. Are you having a reaction (difficulty breathing--STAT)? No   4. What is your medication issue? Patient needs a new 90 day 3 refill prescription for valsartan (DIOVAN) 320 MG tablet sent to [facility], [place] AT [place]

## 2022-06-16 DIAGNOSIS — I509 Heart failure, unspecified: Secondary | ICD-10-CM | POA: Diagnosis not present

## 2022-06-16 DIAGNOSIS — K219 Gastro-esophageal reflux disease without esophagitis: Secondary | ICD-10-CM | POA: Diagnosis not present

## 2022-06-16 DIAGNOSIS — E782 Mixed hyperlipidemia: Secondary | ICD-10-CM | POA: Diagnosis not present

## 2022-06-16 DIAGNOSIS — N1832 Chronic kidney disease, stage 3b: Secondary | ICD-10-CM | POA: Diagnosis not present

## 2022-06-16 DIAGNOSIS — I10 Essential (primary) hypertension: Secondary | ICD-10-CM | POA: Diagnosis not present

## 2022-06-16 NOTE — Progress Notes (Unsigned)
Cardiology Office Note:    Date:  06/16/2022   ID:  [name] [name], DOB [birth year], MRN [id number]  PCP:  Andriea Jo, [place] Providers Cardiologist:  None { Click to update primary MD,subspecialty MD or APP then REFRESH:1}  *** Referring MD: Aretta Nip, MD   Chief Complaint:  No chief complaint on file. {Click here for Visit Info    :1}   Patient Profile: HFmrEF (heart failure with mildly reduced ejection fraction)  TTE 04/02/22: EF 45-50, global HK, Gr 1 DD, NL RVSF, NL PASP (RVSP 33.9), mild LAE, RAP 3 Left Bundle Branch Block Complete Heart Block s/p Pacemaker  Hypertension  OSA Seizure d/o Myoview 10/23/14: no ischemia, low risk EF 58   Cardiac Studies & Procedures     STRESS TESTS  MYOCARDIAL PERFUSION IMAGING 09/02/2015   ECHOCARDIOGRAM  ECHOCARDIOGRAM COMPLETE 04/02/2022  Narrative ECHOCARDIOGRAM REPORT    Patient Name:   [name] [name] Date of Exam: 04/02/2022 Medical Rec #:  [id number]         Height:       61.5 in Accession #:    [id number]        Weight:       181.0 lb Date of Birth:  Mar 17, 1961         BSA:          1.821 m Patient Age:    62 years          BP:           142/74 mmHg Patient Gender: F                 HR:           63 bpm. Exam Location:  [place]  Procedure: 2D Echo, 3D Echo, Color Doppler and Cardiac Doppler  Indications:    I42.9 Cardiomyopathy  History:        Patient has prior history of Echocardiogram examinations, most recent 09/18/2020. Cardiomyopathy, Pacemaker, Arrythmias:LBBB, Signs/Symptoms:Dizziness/Lightheadedness; Risk Factors:Current Smoker, Sleep Apnea, Family History of Coronary Artery Disease and Hypertension.  Sonographer:    Deliah Boston RDCS Referring Phys: [id number] [place]   1. Left ventricular ejection fraction, by estimation, is 45 to 50%. The left ventricle has mildly decreased function. The left ventricle demonstrates global hypokinesis.  Left ventricular diastolic parameters are consistent with Grade I diastolic dysfunction (impaired relaxation). 2. Right ventricular systolic function is normal. The right ventricular size is normal. There is normal pulmonary artery systolic pressure. The estimated right ventricular systolic pressure is 99.3 mmHg. 3. Left atrial size was mildly dilated. 4. The mitral valve is normal in structure. No evidence of mitral valve regurgitation. No evidence of mitral stenosis. 5. The aortic valve is tricuspid. There is mild calcification of the aortic valve. Aortic valve regurgitation is not visualized. No aortic stenosis is present. 6. The inferior vena cava is normal in size with greater than 50% respiratory variability, suggesting right atrial pressure of 3 mmHg.  FINDINGS Left Ventricle: Left ventricular ejection fraction, by estimation, is 45 to 50%. The left ventricle has mildly decreased function. The left ventricle demonstrates global hypokinesis. The left ventricular internal cavity size was normal in size. There is no left ventricular hypertrophy. Left ventricular diastolic parameters are consistent with Grade I diastolic dysfunction (impaired relaxation).  Right Ventricle: The right ventricular size is normal. No increase in right ventricular wall thickness. Right ventricular systolic function is normal. There is normal pulmonary artery systolic  pressure. The tricuspid regurgitant velocity is 2.78 m/s, and with an assumed right atrial pressure of 3 mmHg, the estimated right ventricular systolic pressure is 10.9 mmHg.  Left Atrium: Left atrial size was mildly dilated.  Right Atrium: Right atrial size was normal in size.  Pericardium: There is no evidence of pericardial effusion.  Mitral Valve: The mitral valve is normal in structure. No evidence of mitral valve regurgitation. No evidence of mitral valve stenosis.  Tricuspid Valve: The tricuspid valve is normal in structure. Tricuspid valve  regurgitation is trivial.  Aortic Valve: The aortic valve is tricuspid. There is mild calcification of the aortic valve. Aortic valve regurgitation is not visualized. No aortic stenosis is present.  Pulmonic Valve: The pulmonic valve was normal in structure. Pulmonic valve regurgitation is not visualized.  Aorta: The aortic root is normal in size and structure.  Venous: The inferior vena cava is normal in size with greater than 50% respiratory variability, suggesting right atrial pressure of 3 mmHg.  IAS/Shunts: No atrial level shunt detected by color flow Doppler.  Additional Comments: A device lead is visualized in the right ventricle.   LEFT VENTRICLE PLAX 2D LVIDd:         4.50 cm   Diastology LVIDs:         2.70 cm   LV e' medial:    4.68 cm/s LV PW:         1.00 cm   LV E/e' medial:  17.7 LV IVS:        0.80 cm   LV e' lateral:   5.66 cm/s LVOT diam:     2.20 cm   LV E/e' lateral: 14.6 LV SV:         63 LV SV Index:   35 LVOT Area:     3.80 cm  3D Volume EF: 3D EF:        63 % LV EDV:       121 ml LV ESV:       45 ml LV SV:        76 ml  RIGHT VENTRICLE RV Basal diam:  2.80 cm RV S prime:     13.50 cm/s TAPSE (M-mode): 1.8 cm  LEFT ATRIUM             Index        RIGHT ATRIUM           Index LA diam:        4.30 cm 2.36 cm/m   RA Area:     13.70 cm LA Vol (A2C):   59.9 ml 32.89 ml/m  RA Volume:   31.60 ml  17.35 ml/m LA Vol (A4C):   52.4 ml 28.77 ml/m LA Biplane Vol: 59.1 ml 32.45 ml/m AORTIC VALVE LVOT Vmax:   71.50 cm/s LVOT Vmean:  46.650 cm/s LVOT VTI:    0.166 m  AORTA Ao Root diam: 2.90 cm Ao Asc diam:  2.90 cm  MITRAL VALVE               TRICUSPID VALVE MV Area (PHT): cm         TR Peak grad:   30.9 mmHg MV Decel Time: 265 msec    TR Vmax:        278.00 cm/s MV E velocity: 82.68 cm/s MV A velocity: 97.95 cm/s  SHUNTS MV E/A ratio:  0.84        Systemic VTI:  0.17 m Systemic Diam: 2.20 cm  Dalton McleanMD  Electronically signed by Franki Monte Signature Date/Time: 04/02/2022/5:46:54 PM    Final             History of Present Illness:   [name] [name] is a 62 y.o. female with the above problem list.  She was last seen in clinic by Mayra Reel NP. She returns for f/u. ***      EKG:  {Desc; done/not:10129}    Reviewed and updated this encounter:***     ROS  Labs/Other Test Reviewed:   Recent Labs: 05/20/2022: ALT 18; BUN 10; Creatinine, Ser 1.10; Hemoglobin 14.2; Platelets 252; Potassium 3.6; Sodium 138  Recent Lipid Panel No results for input(s): "CHOL", "TRIG", "HDL", "VLDL", "LDLCALC", "LDLDIRECT" in the last 8760 hours.  Risk Assessment/Calculations/Metrics:   {Does this patient have ATRIAL FIBRILLATION?:[id number]}     No BP recorded.  {Refresh Note OR Click here to enter BP  :1}***   Physical Exam:   VS:  LMP  (LMP Unknown)    Wt Readings from Last 3 Encounters:  06/11/22 176 lb (79.8 kg)  05/20/22 176 lb 9.6 oz (80.1 kg)  04/29/22 184 lb (83.5 kg)    Physical Exam ***     ASSESSMENT & PLAN:   No problem-specific Assessment & Plan notes found for this encounter.        {Are you ordering a CV Procedure (e.g. stress test, cath, DCCV, TEE, etc)?   Press F2        :[id number]}  Dispo:  No follow-ups on file.  Medication Adjustments/Labs and Tests Ordered: Current medicines are reviewed at length with the patient today.  Concerns regarding medicines are outlined above.  Tests Ordered: No orders of the defined types were placed in this encounter.  Medication Changes: No orders of the defined types were placed in this encounter.  Signed, Richardson Dopp, PA-C  06/16/2022 10:47 PM    [address], [place], [place]  [address] Phone: [phone]; Fax: [phone]

## 2022-06-17 ENCOUNTER — Encounter: Payer: Self-pay | Admitting: Physician Assistant

## 2022-06-17 ENCOUNTER — Ambulatory Visit: Payer: Medicare HMO | Attending: Physician Assistant | Admitting: Physician Assistant

## 2022-06-17 VITALS — BP 140/68 | HR 64 | Ht 61.0 in | Wt 180.8 lb

## 2022-06-17 DIAGNOSIS — I442 Atrioventricular block, complete: Secondary | ICD-10-CM

## 2022-06-17 DIAGNOSIS — I5020 Unspecified systolic (congestive) heart failure: Secondary | ICD-10-CM

## 2022-06-17 DIAGNOSIS — I5022 Chronic systolic (congestive) heart failure: Secondary | ICD-10-CM

## 2022-06-17 DIAGNOSIS — I10 Essential (primary) hypertension: Secondary | ICD-10-CM | POA: Diagnosis not present

## 2022-06-17 DIAGNOSIS — Z720 Tobacco use: Secondary | ICD-10-CM

## 2022-06-17 LAB — BASIC METABOLIC PANEL
BUN/Creatinine Ratio: 8 — ABNORMAL LOW (ref 12–28)
BUN: 9 mg/dL (ref 8–27)
CO2: 27 mmol/L (ref 20–29)
Calcium: 9.3 mg/dL (ref 8.7–10.3)
Chloride: 101 mmol/L (ref 96–106)
Creatinine, Ser: 1.18 mg/dL — ABNORMAL HIGH (ref 0.57–1.00)
Glucose: 88 mg/dL (ref 70–99)
Potassium: 3.8 mmol/L (ref 3.5–5.2)
Sodium: 140 mmol/L (ref 134–144)
eGFR: 52 mL/min/{1.73_m2} — ABNORMAL LOW (ref >59)

## 2022-06-17 MED ORDER — SPIRONOLACTONE 25 MG PO TABS: 12.5000 mg | ORAL_TABLET | Freq: Every day | ORAL | 3 refills | Status: DC

## 2022-06-17 NOTE — Assessment & Plan Note (Signed)
I have recommended that she quit smoking.

## 2022-06-17 NOTE — Assessment & Plan Note (Signed)
S/p Pacemaker. F/u with EP as planned.

## 2022-06-17 NOTE — Patient Instructions (Signed)
Medication Instructions:  Your physician has recommended you make the following change in your medication:   START Spironolactone 25 taking only 1/2 tablet daily   *If you need a refill on your cardiac medications before your next appointment, please call your pharmacy*   Lab Work: 1 WEEK:  BMET  06/25/22 ANYTIME AFTER 7:15, YOU DON'T NEED TO FAST  2 WEEKS:  BMET  1/25 ANYTIME AFTER 7;15, YOU DON'T NEED TO FAST  If you have labs (blood work) drawn today and your tests are completely normal, you will receive your results only by: [place] (if you have MyChart) OR A paper copy in the mail If you have any lab test that is abnormal or we need to change your treatment, we will call you to review the results.   Testing/Procedures: None ordered   Follow-Up: At [facility], you and your health needs are our priority.  As part of our continuing mission to provide you with exceptional heart care, we have created designated Provider Care Teams.  These Care Teams include your primary Cardiologist (physician) and Advanced Practice Providers (APPs -  Physician Assistants and Nurse Practitioners) who all work together to provide you with the care you need, when you need it.  We recommend signing up for the patient portal called "MyChart".  Sign up information is provided on this After Visit Summary.  MyChart is used to connect with patients for Virtual Visits (Telemedicine).  Patients are able to view lab/test results, encounter notes, upcoming appointments, etc.  Non-urgent messages can be sent to your provider as well.   To learn more about what you can do with MyChart, go to [email or web address].    Your next appointment:   3 month(s)  The format for your next appointment:   In Person  Provider:   Richardson Dopp, PA-C         Other Instructions   Important Information About Sugar

## 2022-06-17 NOTE — Assessment & Plan Note (Signed)
EF ~ 50. She is NYHA II. Volume status stable. BP needs better control. I recommend continuing Valsartan 320 mg once daily, Carvedilol 3.125 mg twice daily. Add Spironolactone 12.5 mg once daily. BMET today and repeat weekly x 2. F/u in 3 mos. Consider repeat Echocardiogram once BP better controlled.

## 2022-06-17 NOTE — Assessment & Plan Note (Signed)
BPs 140-150s at home. We discussed options for BP management as it relates to management of EF. Start Spironolactone 12.5 mg once daily. Continue Carvedilol 3.125 mg twice daily, Valsartan 320 mg once daily, Amlodipine 5 mg once daily. F/u in 3 mos.

## 2022-06-25 ENCOUNTER — Ambulatory Visit: Payer: Medicare HMO | Attending: Physician Assistant

## 2022-06-25 DIAGNOSIS — I10 Essential (primary) hypertension: Secondary | ICD-10-CM | POA: Diagnosis not present

## 2022-06-25 DIAGNOSIS — L988 Other specified disorders of the skin and subcutaneous tissue: Secondary | ICD-10-CM | POA: Diagnosis not present

## 2022-06-25 DIAGNOSIS — D485 Neoplasm of uncertain behavior of skin: Secondary | ICD-10-CM | POA: Diagnosis not present

## 2022-06-25 DIAGNOSIS — Z85820 Personal history of malignant melanoma of skin: Secondary | ICD-10-CM | POA: Diagnosis not present

## 2022-06-25 LAB — BASIC METABOLIC PANEL
BUN/Creatinine Ratio: 8 — ABNORMAL LOW (ref 12–28)
BUN: 9 mg/dL (ref 8–27)
CO2: 26 mmol/L (ref 20–29)
Calcium: 9.4 mg/dL (ref 8.7–10.3)
Chloride: 103 mmol/L (ref 96–106)
Creatinine, Ser: 1.07 mg/dL — ABNORMAL HIGH (ref 0.57–1.00)
Glucose: 89 mg/dL (ref 70–99)
Potassium: 4.1 mmol/L (ref 3.5–5.2)
Sodium: 138 mmol/L (ref 134–144)
eGFR: 59 mL/min/{1.73_m2} — ABNORMAL LOW (ref >59)

## 2022-06-29 NOTE — Progress Notes (Signed)
Pt has been made aware of normal result and verbalized understanding.  jw

## 2022-07-02 ENCOUNTER — Ambulatory Visit (INDEPENDENT_AMBULATORY_CARE_PROVIDER_SITE_OTHER): Payer: Medicare HMO

## 2022-07-02 ENCOUNTER — Ambulatory Visit: Payer: Medicare HMO | Attending: Physician Assistant

## 2022-07-02 DIAGNOSIS — I10 Essential (primary) hypertension: Secondary | ICD-10-CM | POA: Diagnosis not present

## 2022-07-02 DIAGNOSIS — I442 Atrioventricular block, complete: Secondary | ICD-10-CM | POA: Diagnosis not present

## 2022-07-02 LAB — CUP PACEART REMOTE DEVICE CHECK
Battery Remaining Longevity: 41 mo
Battery Voltage: 2.97 V
Brady Statistic AP VP Percent: 37.34 %
Brady Statistic AP VS Percent: 0 %
Brady Statistic AS VP Percent: 62.58 %
Brady Statistic AS VS Percent: 0.08 %
Brady Statistic RA Percent Paced: 37.25 %
Brady Statistic RV Percent Paced: 99.79 %
Date Time Interrogation Session: 20240125142744
Implantable Lead Connection Status: 753985
Implantable Lead Connection Status: 753985
Implantable Lead Implant Date: 20160707
Implantable Lead Implant Date: 20160707
Implantable Lead Location: 753859
Implantable Lead Location: 753860
Implantable Lead Model: 5076
Implantable Lead Model: 5076
Implantable Pulse Generator Implant Date: 20160707
Lead Channel Impedance Value: 380 Ohm
Lead Channel Impedance Value: 380 Ohm
Lead Channel Impedance Value: 665 Ohm
Lead Channel Impedance Value: 684 Ohm
Lead Channel Pacing Threshold Amplitude: 0.375 V
Lead Channel Pacing Threshold Amplitude: 0.5 V
Lead Channel Pacing Threshold Pulse Width: 0.4 ms
Lead Channel Pacing Threshold Pulse Width: 0.4 ms
Lead Channel Sensing Intrinsic Amplitude: 27.5 mV
Lead Channel Sensing Intrinsic Amplitude: 27.5 mV
Lead Channel Sensing Intrinsic Amplitude: 3.875 mV
Lead Channel Sensing Intrinsic Amplitude: 3.875 mV
Lead Channel Setting Pacing Amplitude: 1.5 V
Lead Channel Setting Pacing Amplitude: 2.5 V
Lead Channel Setting Pacing Pulse Width: 0.4 ms
Lead Channel Setting Sensing Sensitivity: 1.2 mV
Zone Setting Status: 755011
Zone Setting Status: 755011

## 2022-07-02 LAB — BASIC METABOLIC PANEL
BUN/Creatinine Ratio: 9 — ABNORMAL LOW (ref 12–28)
BUN: 10 mg/dL (ref 8–27)
CO2: 22 mmol/L (ref 20–29)
Calcium: 9.6 mg/dL (ref 8.7–10.3)
Chloride: 102 mmol/L (ref 96–106)
Creatinine, Ser: 1.11 mg/dL — ABNORMAL HIGH (ref 0.57–1.00)
Glucose: 98 mg/dL (ref 70–99)
Potassium: 3.9 mmol/L (ref 3.5–5.2)
Sodium: 141 mmol/L (ref 134–144)
eGFR: 56 mL/min/{1.73_m2} — ABNORMAL LOW (ref >59)

## 2022-07-06 NOTE — Progress Notes (Signed)
Pt has been made aware of normal result and verbalized understanding.  jw

## 2022-07-07 DIAGNOSIS — G4733 Obstructive sleep apnea (adult) (pediatric): Secondary | ICD-10-CM | POA: Diagnosis not present

## 2022-07-07 DIAGNOSIS — K219 Gastro-esophageal reflux disease without esophagitis: Secondary | ICD-10-CM | POA: Diagnosis not present

## 2022-07-07 DIAGNOSIS — I10 Essential (primary) hypertension: Secondary | ICD-10-CM | POA: Diagnosis not present

## 2022-07-07 DIAGNOSIS — I509 Heart failure, unspecified: Secondary | ICD-10-CM | POA: Diagnosis not present

## 2022-07-11 DIAGNOSIS — M79662 Pain in left lower leg: Secondary | ICD-10-CM | POA: Diagnosis not present

## 2022-07-13 ENCOUNTER — Ambulatory Visit: Payer: Medicare HMO | Admitting: Pulmonary Disease

## 2022-07-22 NOTE — Progress Notes (Signed)
Remote pacemaker transmission.   

## 2022-07-24 DIAGNOSIS — L308 Other specified dermatitis: Secondary | ICD-10-CM | POA: Diagnosis not present

## 2022-07-24 DIAGNOSIS — B0223 Postherpetic polyneuropathy: Secondary | ICD-10-CM | POA: Diagnosis not present

## 2022-07-24 DIAGNOSIS — B028 Zoster with other complications: Secondary | ICD-10-CM | POA: Diagnosis not present

## 2022-07-27 ENCOUNTER — Telehealth: Payer: Self-pay | Admitting: Neurology

## 2022-07-27 NOTE — Telephone Encounter (Signed)
Please ask her to come in for visit to discuss. I have some openings tomorrow. Bring her sister.

## 2022-07-27 NOTE — Telephone Encounter (Signed)
Pt said, Judson Roch, NP told me to call if I have another seizure. Wanted to make her aware have had 3 seizures, 2/12,2/16,2/18. Did not have no injuries or went to the ED. 2/18, Getting up from a chair and sled to floor and hit my head.Seizure lasted 30 seconds. Did not go to the ED.  2/16, outside on the porch smoking a cigarette. Chair kept moving backward. Sister realize I was having a seizure and push the chair back under me. My sister kept talking to me until I came out of the seizure, lasted 1 minute. 2/12 was at [facility] picking up prescription, felt like everything was spinning, held on the counter and stood there for a while, seizure lasted 1 minute. Would like a call back to discuss if Prednisone could have caused the seizures.

## 2022-07-27 NOTE — Telephone Encounter (Signed)
Pt has been scheduled for tomorrow morning.

## 2022-07-28 ENCOUNTER — Encounter: Payer: Self-pay | Admitting: Neurology

## 2022-07-28 ENCOUNTER — Ambulatory Visit: Payer: Medicare HMO | Admitting: Neurology

## 2022-07-28 VITALS — BP 136/55 | HR 72 | Ht 61.0 in | Wt 180.0 lb

## 2022-07-28 DIAGNOSIS — R569 Unspecified convulsions: Secondary | ICD-10-CM | POA: Diagnosis not present

## 2022-07-28 NOTE — Progress Notes (Signed)
PATIENT: [name] [name] DOB: 1961-01-04  REASON FOR VISIT: follow up HISTORY FROM: patient, sister Primary Neurologist: Dr. Julius [name]. [place]   HISTORY OF PRESENT ILLNESS: Today 07/28/22 Here with her sister to discuss recent seizure activity. About 3 weeks ago standing in line at drug store, vision dimmed with tunnel vision, leaned against counter, staff helped her, she came around was still about to function talk. Few days ago, fell over the side of her recliner, few seconds was out of it. Was a little confused. Another time, got up from chair, leaned backwards, like frozen, pushing the chair back. Remains on Lamictal 200/250 mg daily, Trileptal 300 mg twice daily, no missed doses. Taking 2 weeks of prednisone taper (2 pills daily, then 1 pill the 2nd week) when seizures started. Yesterday under a lot of stress, had no events. When on prednisone, felt couldn't walk straight. Was on for shin splints.  Doesn't drive a car. Has been placed on gabapentin up to 300 mg TID, has shingles, present for 2 weeks. They think prednisone caused seizure events.   -March 2015 EEG showed brief episodes of theta slowing from the left temporal region -Video monitoring EEG at [facility], she didn't have any seizure events -[facility] placed her on Zonegran, but had anxiety and diarrhea, Lamictal was started in 2016 -MRI of the brain has question the possibility of mesial temporal sclerosis on the right -Pacemaker placed in 2016 due to complete heart block, question if this may be the etiology of seizure type events? -EEG during hospitalization January 2022 was normal -CT head November 2023 showed generalized cerebral atrophy microvascular disease  Update 05/20/2022 SS: Here today alone, her sister drove her. Has been having BP issues, in the ER 05/07/22, med adjustment, seems to be helping. Takes Trileptal 300 mg BID, Lamictal 200/250 mg. Under some stress with another sister moving in, during an argument may  have had seizure staring off for 1 minute. Still can have gait instability. 1 fall when leaning forward.   Update 05/20/21 SS: [name] [name] here today for follow-up of history of seizure disorder.  Remains on Trileptal and Lamictal.  Last seizure was in January 2022 when she was significantly ill. At last visit in Jan 2022 Lamictal level was 10.7, Trileptal was 17. No seizures of recent. Still lives with sister, doesn't drive a car. Other than lingering cough, no health issues. If she turns a corner, she can veer off, this is not new. There have been no falls. Here today alone. In the past the dosage of Trileptal has been reduced to help with gait stability without change, the dose was increased back. Has pacemaker.   Update 11/11/2020 SS: [name] [name] is a 61 year old female with history of seizure disorder.  Last seizure was in January 2022, when she had significant illness requiring intubation with hypoxia and elevated blood glucose.  She is on Trileptal and Lamictal.  No further seizure events. Both medications were therapeutic at last office visit.  She is living with her sister.  Is currently not driving a car.  Has a pacemaker, sees cardiology. Claims has been doing well. At times feels dizzy, no falls.  Here today for evaluation unaccompanied.  HISTORY 07/08/2020 Dr. Jannifer [name]: Ms. [name] [name] is a 62 year old left-handed white female with a history of a seizure disorder.  The patient has been treated with Trileptal and lamotrigine, she has not had a seizure since December 2019.  Unfortunately, the patient developed a gradual onset of shortness of breath several days prior  to a June 19, 2020 admission to the hospital.  The patient began having severe shortness of breath on the day of admission, EMS was called and the patient had a seizure prior to going in the hospital.  She was noted to have oxygen saturations in the 60% range at that time, her white blood count was 18.6 when she went in the  hospital.  She was felt to have an aspiration pneumonia and required intubation.  During the hospitalization, she was extubated and then had another seizure following extubation and then had to be reintubated again.  The patient never had blood levels of the Trileptal or lamotrigine done on admission.  Her ammonia level is 147 with the upper range of normal being 35.  The patient has since been discharged from the hospital, she remains on her current dose of the lamotrigine taking 200 mg twice daily and 50 mg in the evening and she is on Trileptal 300 mg twice daily.  Higher levels of Trileptal in the past have resulted in increased gait instability.  The patient has not had any seizures since coming out of the hospital.  She is not operating a motor vehicle.  She reports some gait instability and fatigue and drowsiness at times, she has not had any falls.  She has not had any further seizures coming out of the hospital.  She reports occasional migratory head pains consistent with "ice pick pains".  She denies any numbness or weakness of the extremities.  She has a pacemaker in place, she cannot have MRI.  A CT scan of the brain was done in the hospital and was unremarkable.   REVIEW OF SYSTEMS: Out of a complete 14 system review of symptoms, the patient complains only of the following symptoms, and all other reviewed systems are negative.  See HPI  ALLERGIES: Allergies  Allergen Reactions   Zonisamide Anxiety    Panic Attacks   Aspirin Hives    Upset stomach   Ciprofloxacin     Seizures & it keeps her awake   Codeine Hives   Hydrocodone Hives   Oxycodone Hives   Penicillins Hives and Swelling    Did it involve swelling of the face/tongue/throat, SOB, or low BP? Yes Did it involve sudden or severe rash/hives, skin peeling, or any reaction on the inside of your mouth or nose? Yes Did you need to seek medical attention at a hospital or doctor's office? No When did it last happen? 1990's   If all  above answers are "NO", may proceed with cephalosporin use. Pt tolerated ampicillin/sulbactam without issues 06/2020   HOME MEDICATIONS: Outpatient Medications Prior to Visit  Medication Sig Dispense Refill   acetaminophen (TYLENOL) 500 MG tablet Take 500-1,000 mg by mouth every 6 (six) hours as needed for moderate pain (pain).      albuterol (VENTOLIN HFA) 108 (90 Base) MCG/ACT inhaler Inhale 2 puffs into the lungs every 6 (six) hours as needed for wheezing or shortness of breath. 8 g 2   amLODipine (NORVASC) 5 MG tablet Take 5 mg by mouth daily.     atorvastatin (LIPITOR) 20 MG tablet Take 20 mg by mouth daily.     benzonatate (TESSALON) 200 MG capsule Take 1 capsule (200 mg total) by mouth 2 (two) times daily as needed for cough. 60 capsule 3   busPIRone (BUSPAR) 10 MG tablet Take 10 mg by mouth 2 (two) times daily.     carvedilol (COREG) 3.125 MG tablet TAKE 1 TABLET(3.125 MG)  BY MOUTH TWICE DAILY 180 tablet 3   Cholecalciferol (VITAMIN D3) 2000 units capsule Take 1 capsule (2,000 Units total) by mouth daily. 90 capsule 0   escitalopram (LEXAPRO) 20 MG tablet Take 1 tablet (20 mg total) by mouth daily. 30 tablet 0   fluticasone-salmeterol (WIXELA INHUB) 500-50 MCG/ACT AEPB Inhale 1 puff into the lungs in the morning and at bedtime. 60 each 6   gabapentin (NEURONTIN) 300 MG capsule 1 capsule Orally three times a day for 10 days     lamoTRIgine (LAMICTAL) 200 MG tablet Take 1 tablet (200 mg total) by mouth 2 (two) times daily. 180 tablet 3   lamoTRIgine (LAMICTAL) 25 MG tablet Take 2 tablets (50 mg total) by mouth at bedtime. 180 tablet 3   montelukast (SINGULAIR) 10 MG tablet Take 10 mg by mouth at bedtime.     Oxcarbazepine (TRILEPTAL) 300 MG tablet Take 1 tablet (300 mg total) by mouth 2 (two) times daily. 180 tablet 3   pantoprazole (PROTONIX) 40 MG tablet Take 1 tablet by mouth daily.     Spacer/Aero-Holding Chambers DEVI 2 Inhalers by Does not apply route in the morning and at bedtime. 1  each 1   spironolactone (ALDACTONE) 25 MG tablet Take 0.5 tablets (12.5 mg total) by mouth daily. 45 tablet 3   valsartan (DIOVAN) 320 MG tablet Take 320 mg by mouth daily.     No facility-administered medications prior to visit.    PAST MEDICAL HISTORY: Past Medical History:  Diagnosis Date   Arthritis    CHF (congestive heart failure) (HCC)    Chronic cough    Chronic insomnia 03/07/2015   Diverticulitis    GERD (gastroesophageal reflux disease)    Gout    Hiatal hernia    Hypertension    LBBB (left bundle branch block)    Pacemaker    [employer]onic due to heart block   Panic disorder 09/07/2014   Pneumonia    x 1 - Walking   Seizures ([place])    Last Seizure in 2017 - controlled with meds    Sleep apnea    does not use a CPAP due to grand mal seizure   Wrist tendonitis    R     PAST SURGICAL HISTORY: Past Surgical History:  Procedure Laterality Date   COLONOSCOPY  last 03/31/2016   COLONOSCOPY WITH PROPOFOL N/A 04/04/2019   Procedure: COLONOSCOPY WITH PROPOFOL;  Surgeon: Mauri Pole, MD;  Location: [facility];  Service: Endoscopy;  Laterality: N/A;   COLONOSCOPY WITH PROPOFOL N/A 06/15/2019   Procedure: COLONOSCOPY WITH PROPOFOL;  Surgeon: Rush Landmark Telford Nab., MD;  Location: [place];  Service: Gastroenterology;  Laterality: N/A;   COLONOSCOPY WITH PROPOFOL N/A 02/28/2020   Procedure: COLONOSCOPY WITH PROPOFOL;  Surgeon: Rush Landmark Telford Nab., MD;  Location: [facility];  Service: Gastroenterology;  Laterality: N/A;   COLONOSCOPY WITH PROPOFOL N/A 06/11/2022   Procedure: COLONOSCOPY WITH PROPOFOL;  Surgeon: Rush Landmark Telford Nab., MD;  Location: [facility];  Service: Gastroenterology;  Laterality: N/A;   ENDOROTOR  02/28/2020   Procedure: [id number];  Surgeon: Mansouraty, Telford Nab., MD;  Location: [facility];  Service: Gastroenterology;;   ENDOSCOPIC MUCOSAL RESECTION N/A 06/15/2019   Procedure: ENDOSCOPIC MUCOSAL RESECTION;  Surgeon: Irving Copas., MD;  Location: [place];  Service: Gastroenterology;  Laterality: N/A;   ENDOSCOPIC MUCOSAL RESECTION N/A 02/28/2020   Procedure: ENDOSCOPIC MUCOSAL RESECTION;  Surgeon: Rush Landmark Telford Nab., MD;  Location: [facility];  Service: Gastroenterology;  Laterality: N/A;   ENDOSCOPIC  MUCOSAL RESECTION N/A 06/11/2022   Procedure: ENDOSCOPIC MUCOSAL RESECTION;  Surgeon: Rush Landmark Telford Nab., MD;  Location: [facility];  Service: Gastroenterology;  Laterality: N/A;   EP IMPLANTABLE DEVICE N/A 12/13/2014   Procedure: Pacemaker Implant;  Surgeon: Deboraha Sprang, MD;  Location: [place] [facility];  Service: Cardiovascular;  Laterality: N/A;   HAND SURGERY     HEMOSTASIS CLIP PLACEMENT  06/15/2019   Procedure: HEMOSTASIS CLIP PLACEMENT;  Surgeon: Irving Copas., MD;  Location: [facility];  Service: Gastroenterology;;   HEMOSTASIS CLIP PLACEMENT  02/28/2020   Procedure: HEMOSTASIS CLIP PLACEMENT;  Surgeon: Irving Copas., MD;  Location: [facility];  Service: Gastroenterology;;   HEMOSTASIS CLIP PLACEMENT  06/11/2022   Procedure: HEMOSTASIS CLIP PLACEMENT;  Surgeon: Irving Copas., MD;  Location: [facility];  Service: Gastroenterology;;   HEMOSTASIS CONTROL  02/28/2020   Procedure: HEMOSTASIS CONTROL;  Surgeon: Irving Copas., MD;  Location: [facility];  Service: Gastroenterology;;   POLYPECTOMY     POLYPECTOMY  04/04/2019   Procedure: POLYPECTOMY;  Surgeon: Mauri Pole, MD;  Location: [facility];  Service: Endoscopy;;   POLYPECTOMY  06/15/2019   Procedure: POLYPECTOMY;  Surgeon: Irving Copas., MD;  Location[facility]COPY;  Service: Gastroenterology;;   POLYPECTOMY  06/11/2022   Procedure: POLYPECTOMY;  Surgeon: Irving Copas., MD;  Location: [facility];  Service: Gastroenterology;;   SKIN CANCER EXCISION     SUBMUCOSAL LIFTING INJECTION  06/15/2019   Procedure: SUBMUCOSAL LIFTING INJECTION;  Surgeon: Irving Copas., MD;  Location: [facility] ENDOSCOPY;  Service: Gastroenterology;;   SUBMUCOSAL LIFTING INJECTION  06/11/2022   Procedure: SUBMUCOSAL LIFTING INJECTION;  Surgeon: Irving Copas., MD;  Location: [facility];  Service: Gastroenterology;;   TUBAL LIGATION     UPPER GI ENDOSCOPY     WISDOM TOOTH EXTRACTION      FAMILY HISTORY: Family History  Problem Relation Age of Onset   Allergic rhinitis Mother    Alzheimer's disease Mother    Hypertension Mother    Colon polyps Mother    Allergic rhinitis Father    Hypertension Father    Stroke Father    Leukemia Father    Prostate cancer Father    Colon polyps Father    Allergic rhinitis Sister    Colon polyps Sister    Allergic rhinitis Sister    Kidney failure Sister    Diabetes Sister    Colon polyps Sister    Allergic rhinitis Brother    Stroke Brother    Hypertension Brother    Sleep apnea Brother    Colon polyps Brother    Colon cancer Paternal Aunt    Colon cancer Paternal Uncle    Parkinson's disease Maternal Grandmother    Coronary artery disease Maternal Grandfather    Esophageal cancer Neg Hx    Stomach cancer Neg Hx    Rectal cancer Neg Hx    Inflammatory bowel disease Neg Hx    Liver disease Neg Hx    Pancreatic cancer Neg Hx    Seizures Neg Hx     SOCIAL HISTORY: Social History   Socioeconomic History   Marital status: Single    Spouse name: Not on file   Number of children: 0   Years of education: 14   Highest education level: Not on file  Occupational History   Not on file  Tobacco Use   Smoking status: Some Days    Packs/day: 0.25    Years: 45.00    Total  pack years: 11.25    Types: Cigarettes    Start date: 35    Passive exposure: Current   Smokeless tobacco: Never   Tobacco comments:    Pt states she smokes 2 cigs a day and sometime 4 a week  Vaping Use   Vaping Use: Never used  Substance and Sexual Activity   Alcohol use: Not Currently    Comment: Quit 12/07/2015   Drug use:  Never   Sexual activity: Not Currently    Birth control/protection: Post-menopausal  Other Topics Concern   Not on file  Social History Narrative   Lives with sister   Left handed   Drinks 1-2 cups caffeine daily   Social Determinants of Health   Financial Resource Strain: Not on file  Food Insecurity: Not on file  Transportation Needs: Not on file  Physical Activity: Not on file  Stress: Not on file  Social Connections: Not on file  Intimate Partner Violence: Not on file   PHYSICAL EXAM  Vitals:   07/28/22 0846  BP: (!) 136/55  Pulse: 72  Weight: 180 lb (81.6 kg)  Height: 5' 1"$  (1.549 m)    Body mass index is 34.01 kg/m.  Generalized: Well developed, in no acute distress  Neurological examination  Mentation: Alert oriented to time, place, history taking. Follows all commands speech and language fluent Cranial nerve II-XII: Pupils were equal round reactive to light. Extraocular movements were full, visual field were full on confrontational test. Facial sensation and strength were normal. Head turning and shoulder shrug  were normal and symmetric. Motor: The motor testing reveals 5 over 5 strength of all 4 extremities. Good symmetric motor tone is noted throughout.  Sensory: Sensory testing is intact to soft touch on all 4 extremities. No evidence of extinction is noted.  Coordination: Cerebellar testing reveals good finger-nose-finger and heel-to-shin bilaterally.  Gait and station: Gait is normal, independent Reflexes: Deep tendon reflexes are symmetric and normal bilaterally.   DIAGNOSTIC DATA (LABS, IMAGING, TESTING) - I reviewed patient records, labs, notes, testing and imaging myself where available.  Lab Results  Component Value Date   WBC 9.0 05/20/2022   HGB 14.2 05/20/2022   HCT 42.5 05/20/2022   MCV 92 05/20/2022   PLT 252 05/20/2022      Component Value Date/Time   NA 141 07/02/2022 0728   K 3.9 07/02/2022 0728   CL 102 07/02/2022 0728   CO2 22  07/02/2022 0728   GLUCOSE 98 07/02/2022 0728   GLUCOSE 97 05/07/2022 0302   BUN 10 07/02/2022 0728   CREATININE 1.11 (H) 07/02/2022 0728   CALCIUM 9.6 07/02/2022 0728   PROT 7.2 05/20/2022 1000   ALBUMIN 4.3 05/20/2022 1000   AST 21 05/20/2022 1000   ALT 18 05/20/2022 1000   ALKPHOS 205 (H) 05/20/2022 1000   BILITOT 0.4 05/20/2022 1000   GFRNONAA 51 (L) 05/07/2022 0302   GFRAA 53 (L) 07/29/2020 0902   Lab Results  Component Value Date   CHOL 257 (H) 03/02/2016   HDL 41.90 03/02/2016   LDLDIRECT 199.0 03/02/2016   TRIG 304 (H) 06/25/2020   CHOLHDL 6 03/02/2016   Lab Results  Component Value Date   HGBA1C 4.9 06/19/2020   Lab Results  Component Value Date   VITAMINB12 177 (L) 04/11/2015   Lab Results  Component Value Date   TSH 1.399 12/12/2014   ASSESSMENT AND PLAN 62 y.o. year old female  has a past medical history of Arthritis, CHF (congestive heart failure) (  [place]), Chronic cough, Chronic insomnia (03/07/2015), Diverticulitis, GERD (gastroesophageal reflux disease), Gout, Hiatal hernia, Hypertension, LBBB (left bundle branch block), Pacemaker, Panic disorder (09/07/2014), Pneumonia, Seizures ([place]), Sleep apnea, and Wrist tendonitis. here with:  1.  History of seizures, with recent occurrence   -Events occurred while taking a prednisone taper, she feels this was the causative factor.  Has been compliant with current seizure medication Lamictal 200 mg AM/250 mg PM, Trileptal 300 mg twice daily -Check EEG, check basic labs; in December 2023 Lamictal level 8.3, Trileptal 15 -She is neurologically intact, seems to be doing quite well today -If any further seizure events would recommend increasing Lamictal 250 mg BID -No driving until seizure-free 6 months -Historically, has had difficulty tolerating seizure medication has previously taken Keppra, Zonegran with side effects, higher doses of Trileptal resulted in gait instability -Follow-up in 6 months or sooner if  needed  Evangeline Dakin, DNP 07/28/2022, 9:26 AM [facility] [address], [place], [place] [address] [phone]

## 2022-07-28 NOTE — Patient Instructions (Signed)
Check labs  Check EEG For now continue current medications, monitor for any events No driving until seizure free for 6 months See you back in 6 months

## 2022-07-29 LAB — CBC WITH DIFFERENTIAL/PLATELET
Basophils Absolute: 0.1 10*3/uL (ref 0.0–0.2)
Basos: 1 %
EOS (ABSOLUTE): 0.2 10*3/uL (ref 0.0–0.4)
Eos: 2 %
Hematocrit: 41.1 % (ref 34.0–46.6)
Hemoglobin: 13.7 g/dL (ref 11.1–15.9)
Immature Grans (Abs): 0 10*3/uL (ref 0.0–0.1)
Immature Granulocytes: 0 %
Lymphocytes Absolute: 3.5 10*3/uL — ABNORMAL HIGH (ref 0.7–3.1)
Lymphs: 34 %
MCH: 30.8 pg (ref 26.6–33.0)
MCHC: 33.3 g/dL (ref 31.5–35.7)
MCV: 92 fL (ref 79–97)
Monocytes Absolute: 0.6 10*3/uL (ref 0.1–0.9)
Monocytes: 6 %
Neutrophils Absolute: 5.9 10*3/uL (ref 1.4–7.0)
Neutrophils: 57 %
Platelets: 248 10*3/uL (ref 150–450)
RBC: 4.45 x10E6/uL (ref 3.77–5.28)
RDW: 13.2 % (ref 11.7–15.4)
WBC: 10.2 10*3/uL (ref 3.4–10.8)

## 2022-07-29 LAB — COMPREHENSIVE METABOLIC PANEL
ALT: 17 IU/L (ref 0–32)
AST: 15 IU/L (ref 0–40)
Albumin/Globulin Ratio: 1.9 (ref 1.2–2.2)
Albumin: 4.4 g/dL (ref 3.9–4.9)
Alkaline Phosphatase: 167 IU/L — ABNORMAL HIGH (ref 44–121)
BUN/Creatinine Ratio: 10 — ABNORMAL LOW (ref 12–28)
BUN: 12 mg/dL (ref 8–27)
Bilirubin Total: 0.5 mg/dL (ref 0.0–1.2)
CO2: 23 mmol/L (ref 20–29)
Calcium: 9.3 mg/dL (ref 8.7–10.3)
Chloride: 102 mmol/L (ref 96–106)
Creatinine, Ser: 1.22 mg/dL — ABNORMAL HIGH (ref 0.57–1.00)
Globulin, Total: 2.3 g/dL (ref 1.5–4.5)
Glucose: 91 mg/dL (ref 70–99)
Potassium: 4.3 mmol/L (ref 3.5–5.2)
Sodium: 139 mmol/L (ref 134–144)
Total Protein: 6.7 g/dL (ref 6.0–8.5)
eGFR: 50 mL/min/{1.73_m2} — ABNORMAL LOW (ref >59)

## 2022-08-05 ENCOUNTER — Ambulatory Visit: Payer: Medicare HMO | Admitting: Pulmonary Disease

## 2022-08-05 ENCOUNTER — Encounter: Payer: Self-pay | Admitting: Pulmonary Disease

## 2022-08-05 VITALS — BP 124/64 | HR 62 | Temp 98.6°F | Ht 62.0 in | Wt 182.6 lb

## 2022-08-05 DIAGNOSIS — J4540 Moderate persistent asthma, uncomplicated: Secondary | ICD-10-CM | POA: Diagnosis not present

## 2022-08-05 MED ORDER — ALBUTEROL SULFATE HFA 108 (90 BASE) MCG/ACT IN AERS: 2.0000 | INHALATION_SPRAY | Freq: Four times a day (QID) | RESPIRATORY_TRACT | 11 refills | Status: DC | PRN

## 2022-08-05 NOTE — Patient Instructions (Addendum)
Nice to see you  Ok to use albuterol as needed as you are now  We will avoid W[place] given it worsened the cough  If the cough worsens or albuterol is needed more frequently then send me a message and we can look for an alternative that ins a powder but similar to the Advair puffer you had in the past.   Return to clinic in 6 months or sooner as needed

## 2022-08-05 NOTE — Progress Notes (Signed)
$'@Patient'Q$  ID: [name] [name], female    DOB: Oct 25, 1960, 62 y.o.   MRN: [id number]  Chief Complaint  Patient presents with   Follow-up    Follow up for DOE and anxiety. Pt states that she did not like the Wixella due to how it made her cough worse at night and made her throat feel funny. She is still taking hte Albuterol as needed and likes this better.     Referring provider: Tawnie Jo, PA  HPI:   62 y.o. whom we are seeing in follow-up for evaluation of chronic cough.    Previously much improved with Advair HFA.  Lost access to this due to insurance issues.  Was placed on Wixela.  Seems to make cough little bit worse, dries out mouth.  Stopped using this.  Use albuterol at night prior to sleep given this helps with cough.  Uses occasionally during the day as well.  Overall, cough is much improved.  She is okay with using albuterol as needed.  We discussed escalating to ICS/LABA HFA in the future.  Unfortunately her insurance formulary is limited as we can try to appeal this in the future if needed.  HPI initial visit: Patient admitted to the hospital 2021 respiratory failure.  Thought mostly due to fluid overload.  Possible can condition of aspiration ammonia as well.  Diuresed well with improvement.  Also treated with antibiotics.  Since then, now about 15 months, has had cough.  Seems worse in the evenings.  Mostly dry but is productive at times.  No position make things better or worse.  Albuterol improves cough.  Tried Symbicort a couple months ago with worsened cough and no longer using this.  No other relieving or exacerbating factors.  No seasonal environmental factors she can identify it makes his better or worse.  She does endorse some postnasal drip when lying supine.  Seems worse in the mornings.  Only present for a couple hours in the morning that resolves.  Is on Flonase 1 spray each nostril twice daily for this without significant improvement.  She has a history of GERD  but no refractory symptoms while taking once daily pantoprazole.  She does have some associated dyspnea on exertion.  Not a particular concern of hers but does happen when walking longer distances worse on inclines or stairs.  Unclear if albuterol helps this.  Reviewed most recent chest x-ray 04/2021 with my review interpretation reveals clear lungs bilaterally without evidence of effusion, fluid overload, infiltrate etc.   PMH: Hypertension, history of CHF, history of arrhythmia, hyperlipidemia, allergies, sleep apnea Surgical history: Tubal ligation 2001 Family history: Endorses first relatives with allergies and asthma as well as CAD Social history: Current smoker, 20+ pack year, down to 2 to 3 cigarettes a day, lives in [place] / Pulmonary Flowsheets:   ACT:  Asthma Control Test ACT Total Score  06/25/2021  8:00 AM 19    MMRC:     No data to display           Epworth:      No data to display           Tests:   FENO:  No results found for: "NITRICOXIDE"  PFT:    Latest Ref Rng & Units 12/31/2021    8:42 AM  PFT Results  FVC-Pre L 1.86   FVC-Predicted Pre % 64   FVC-Post L 2.00   FVC-Predicted Post % 69   Pre FEV1/FVC % %  79   Post FEV1/FCV % % 81   FEV1-Pre L 1.47   FEV1-Predicted Pre % 66   FEV1-Post L 1.62   DLCO uncorrected ml/min/mmHg 14.29   DLCO UNC% % 79   DLCO corrected ml/min/mmHg 14.29   DLCO COR %Predicted % 79   DLVA Predicted % 91   TLC L 4.19   TLC % Predicted % 90   RV % Predicted % 116   Personally reviewed and interpreted as spirometry suggestive of moderate restriction versus air trapping.  No bronchodilator response.  Lung volumes consistent with air trapping with elevated RV to TLC ratio, TLC within normal limits.  DLCO within normal limits.  WALK:      No data to display           Imaging: Personally reviewed and as per EMR and discussion in this note  Lab Results: Personally reviewed CBC     Component Value Date/Time   WBC 10.2 07/28/2022 0920   WBC 11.8 (H) 05/07/2022 0302   RBC 4.45 07/28/2022 0920   RBC 4.39 05/07/2022 0302   HGB 13.7 07/28/2022 0920   HCT 41.1 07/28/2022 0920   PLT 248 07/28/2022 0920   MCV 92 07/28/2022 0920   MCH 30.8 07/28/2022 0920   MCH 31.4 05/07/2022 0302   MCHC 33.3 07/28/2022 0920   MCHC 33.7 05/07/2022 0302   RDW 13.2 07/28/2022 0920   LYMPHSABS 3.5 (H) 07/28/2022 0920   MONOABS 0.4 05/07/2022 0302   EOSABS 0.2 07/28/2022 0920   BASOSABS 0.1 07/28/2022 0920    BMET    Component Value Date/Time   NA 139 07/28/2022 0920   K 4.3 07/28/2022 0920   CL 102 07/28/2022 0920   CO2 23 07/28/2022 0920   GLUCOSE 91 07/28/2022 0920   GLUCOSE 97 05/07/2022 0302   BUN 12 07/28/2022 0920   CREATININE 1.22 (H) 07/28/2022 0920   CALCIUM 9.3 07/28/2022 0920   GFRNONAA 51 (L) 05/07/2022 0302   GFRAA 53 (L) 07/29/2020 0902    BNP    Component Value Date/Time   BNP 477.8 (H) 06/22/2020 0105    ProBNP No results found for: "PROBNP"  Specialty Problems       Pulmonary Problems   Acute respiratory failure (HCC)   Chronic cough   Other allergic rhinitis    Allergies  Allergen Reactions   Zonisamide Anxiety    Panic Attacks   Aspirin Hives    Upset stomach   Ciprofloxacin     Seizures & it keeps her awake   Codeine Hives   Hydrocodone Hives   Oxycodone Hives   Penicillins Hives and Swelling    Did it involve swelling of the face/tongue/throat, SOB, or low BP? Yes Did it involve sudden or severe rash/hives, skin peeling, or any reaction on the inside of your mouth or nose? Yes Did you need to seek medical attention at a hospital or doctor's office? No When did it last happen? 1990's   If all above answers are "NO", may proceed with cephalosporin use. Pt tolerated ampicillin/sulbactam without issues 06/2020    Immunization History  Administered Date(s) Administered   DTaP 03/27/2015   Influenza Split 03/22/2009    Influenza,inj,Quad PF,6+ Mos 02/19/2016, 03/31/2017, 05/07/2018, 03/04/2021   Influenza,inj,quad, With Preservative 03/27/2015   Influenza-Unspecified 03/27/2015   PFIZER Comirnaty(Gray Top)Covid-19 Tri-Sucrose Vaccine 06/26/2020   PFIZER(Purple Top)SARS-COV-2 Vaccination 08/30/2019, 09/20/2019, 06/25/2020   Tdap 03/27/2015    Past Medical History:  Diagnosis Date   Arthritis  CHF (congestive heart failure) (HCC)    Chronic cough    Chronic insomnia 03/07/2015   Diverticulitis    GERD (gastroesophageal reflux disease)    Gout    Hiatal hernia    Hypertension    LBBB (left bundle branch block)    Pacemaker    Medtronic due to heart block   Panic disorder 09/07/2014   Pneumonia    x 1 - Walking   Seizures ([place])    Last Seizure in 2017 - controlled with meds    Sleep apnea    does not use a CPAP due to grand mal seizure   Wrist tendonitis    R     Tobacco History: Social History   Tobacco Use  Smoking Status Some Days   Packs/day: 0.25   Years: 45.00   Total pack years: 11.25   Types: Cigarettes   Start date: 1977   Passive exposure: Current  Smokeless Tobacco Never  Tobacco Comments   Pt states she smokes 2 cigs a day and sometime 4 a week   Ready to quit: Not Answered Counseling given: Not Answered Tobacco comments: Pt states she smokes 2 cigs a day and sometime 4 a week   Continue to not smoke  Outpatient Encounter Medications as of 08/05/2022  Medication Sig   acetaminophen (TYLENOL) 500 MG tablet Take 500-1,000 mg by mouth every 6 (six) hours as needed for moderate pain (pain).    albuterol (VENTOLIN HFA) 108 (90 Base) MCG/ACT inhaler Inhale 2 puffs into the lungs every 6 (six) hours as needed for wheezing or shortness of breath.   amLODipine (NORVASC) 5 MG tablet Take 5 mg by mouth daily.   atorvastatin (LIPITOR) 20 MG tablet Take 20 mg by mouth daily.   benzonatate (TESSALON) 200 MG capsule Take 1 capsule (200 mg total) by mouth 2 (two) times daily as  needed for cough.   busPIRone (BUSPAR) 10 MG tablet Take 10 mg by mouth 2 (two) times daily.   carvedilol (COREG) 3.125 MG tablet TAKE 1 TABLET(3.125 MG) BY MOUTH TWICE DAILY   Cholecalciferol (VITAMIN D3) 2000 units capsule Take 1 capsule (2,000 Units total) by mouth daily.   escitalopram (LEXAPRO) 20 MG tablet Take 1 tablet (20 mg total) by mouth daily.   gabapentin (NEURONTIN) 300 MG capsule 100 mg 2 (two) times daily.   lamoTRIgine (LAMICTAL) 200 MG tablet Take 1 tablet (200 mg total) by mouth 2 (two) times daily.   lamoTRIgine (LAMICTAL) 25 MG tablet Take 2 tablets (50 mg total) by mouth at bedtime.   montelukast (SINGULAIR) 10 MG tablet Take 10 mg by mouth at bedtime.   Oxcarbazepine (TRILEPTAL) 300 MG tablet Take 1 tablet (300 mg total) by mouth 2 (two) times daily.   pantoprazole (PROTONIX) 40 MG tablet Take 1 tablet by mouth daily.   Spacer/Aero-Holding Chambers DEVI 2 Inhalers by Does not apply route in the morning and at bedtime.   spironolactone (ALDACTONE) 25 MG tablet Take 0.5 tablets (12.5 mg total) by mouth daily.   [DISCONTINUED] valsartan (DIOVAN) 320 MG tablet Take 320 mg by mouth daily.   [DISCONTINUED] fluticasone-salmeterol (WIXELA INHUB) 500-50 MCG/ACT AEPB Inhale 1 puff into the lungs in the morning and at bedtime.   No facility-administered encounter medications on file as of 08/05/2022.     Review of Systems  Review of Systems  N/a Physical Exam  BP 124/64 (BP Location: Left Arm, Patient Position: Sitting, Cuff Size: Normal)   Pulse 62   Temp 98.6  F (37 C) (Oral)   Ht '5\' 2"'$  (1.575 m)   Wt 182 lb 9.6 oz (82.8 kg)   LMP  (LMP Unknown)   SpO2 96%   BMI 33.40 kg/m   Wt Readings from Last 5 Encounters:  08/05/22 182 lb 9.6 oz (82.8 kg)  07/28/22 180 lb (81.6 kg)  06/17/22 180 lb 12.8 oz (82 kg)  06/11/22 176 lb (79.8 kg)  05/20/22 176 lb 9.6 oz (80.1 kg)    BMI Readings from Last 5 Encounters:  08/05/22 33.40 kg/m  07/28/22 34.01 kg/m  06/17/22  34.16 kg/m  06/11/22 33.25 kg/m  05/20/22 33.37 kg/m     Physical Exam General: Sitting in chair, no acute distress Eyes: EOMI, icterus Neck: Supple, JVP Pulmonary: Clear, normal work of breathing Cardiovascular: Regular rate and rhythm, no murmur Abdomen: Nondistended MS hospital MSK: No synovitis, joint effusion Neuro: Normal gait, no weakness Psych: Normal mood, full affect   Assessment & Plan:   Chronic cough: Present  since time of hospitalization for aspiration pneumonia and fluid overload in 2022.  She thinks related to multiple intubations around that time.  Given concern for infection of the time, concern for possible lingering inflammation, asthma versus smoking-related chronic cough.  Albuterol helps which makes this more likely.  Cough greatly improved with ICS/LABA therapy via HFA.  Insurance formulary limited due to DPI via [place] with some worsening cough, dry mouth she found it intolerable.  Currently treating with albuterol nightly which seems to be adequate albuterol refilled today.  May need to escalate to ICS/LABA HFA in the future with assistance from pharmacy team to look for most cost effective solution versus prior authorization of appeal.  Dyspnea on exertion: Mild.  Suspect related to deconditioning, possible asthma, smoking-related disease as above.  PFTs largely normal with the exception of air trapping.  Suspect related to emphysema versus asthma.  Worsening outside environment raises suspicion for asthma.  Continue as needed albuterol   Return in about 6 months (around 02/03/2023).   Lanier Clam, MD 08/05/2022

## 2022-08-06 ENCOUNTER — Ambulatory Visit: Payer: Medicare HMO | Admitting: Diagnostic Neuroimaging

## 2022-08-06 DIAGNOSIS — R569 Unspecified convulsions: Secondary | ICD-10-CM

## 2022-08-11 NOTE — Procedures (Signed)
   [facility]  EEG (ELECTROENCEPHALOGRAM) REPORT   STUDY DATE: 08/06/22 PATIENT NAME: [name] [name] DOB: 1961/03/06 MRN: [id number]  ORDERING CLINICIAN: Butler Denmark, NP   TECHNOLOGIST: Myer Peer TECHNIQUE: Electroencephalogram was recorded utilizing standard 10-20 system of lead placement and reformatted into average and bipolar montages.  RECORDING TIME: 24 minutes ACTIVATION: hyperventilation and photic stimulation  CLINICAL INFORMATION: 62 year old female with seizure disorder.  FINDINGS: Posterior dominant background rhythms, which attenuate with eye opening, ranging 10-11 hertz and 30-40 microvolts. No focal, lateralizing, epileptiform activity or seizures are seen. Patient recorded in the awake and drowsy state. EKG channel shows regular rhythm of 60-65 beats per minute.   IMPRESSION:   Normal EEG in the awake and drowsy states.     INTERPRETING PHYSICIAN:  Penni Bombard, MD Certified in Neurology, Neurophysiology and Neuroimaging  [facility] [address] [place], [place] [address] [phone]

## 2022-08-19 DIAGNOSIS — M542 Cervicalgia: Secondary | ICD-10-CM | POA: Diagnosis not present

## 2022-08-19 DIAGNOSIS — W19XXXA Unspecified fall, initial encounter: Secondary | ICD-10-CM | POA: Diagnosis not present

## 2022-08-19 DIAGNOSIS — S161XXA Strain of muscle, fascia and tendon at neck level, initial encounter: Secondary | ICD-10-CM | POA: Diagnosis not present

## 2022-08-21 DIAGNOSIS — G4733 Obstructive sleep apnea (adult) (pediatric): Secondary | ICD-10-CM | POA: Diagnosis not present

## 2022-09-01 DIAGNOSIS — K219 Gastro-esophageal reflux disease without esophagitis: Secondary | ICD-10-CM | POA: Diagnosis not present

## 2022-09-01 DIAGNOSIS — E668 Other obesity: Secondary | ICD-10-CM | POA: Diagnosis not present

## 2022-09-01 DIAGNOSIS — I509 Heart failure, unspecified: Secondary | ICD-10-CM | POA: Diagnosis not present

## 2022-09-01 DIAGNOSIS — G4733 Obstructive sleep apnea (adult) (pediatric): Secondary | ICD-10-CM | POA: Diagnosis not present

## 2022-09-01 DIAGNOSIS — I10 Essential (primary) hypertension: Secondary | ICD-10-CM | POA: Diagnosis not present

## 2022-09-09 ENCOUNTER — Telehealth: Payer: Self-pay | Admitting: Neurology

## 2022-09-09 NOTE — Telephone Encounter (Signed)
Pt states she has been told that gabapentin (NEURONTIN) 300 MG capsule is good for epilepsy, she would like Judson Roch, NP to call it into  [place] [phone]

## 2022-09-15 ENCOUNTER — Ambulatory Visit: Payer: Medicare HMO | Admitting: Internal Medicine

## 2022-09-15 DIAGNOSIS — Z959 Presence of cardiac and vascular implant and graft, unspecified: Secondary | ICD-10-CM

## 2022-09-15 DIAGNOSIS — I442 Atrioventricular block, complete: Secondary | ICD-10-CM

## 2022-09-16 ENCOUNTER — Ambulatory Visit: Payer: Medicare HMO | Admitting: Physician Assistant

## 2022-09-17 ENCOUNTER — Telehealth: Payer: Self-pay

## 2022-09-17 NOTE — Telephone Encounter (Signed)
Telephoned patient at mobile number. Patient has last mammogram on 03/2022, and private insurance. BCCCP services are not needed at this time.

## 2022-09-23 DIAGNOSIS — K219 Gastro-esophageal reflux disease without esophagitis: Secondary | ICD-10-CM | POA: Diagnosis not present

## 2022-09-23 DIAGNOSIS — N1832 Chronic kidney disease, stage 3b: Secondary | ICD-10-CM | POA: Diagnosis not present

## 2022-09-23 DIAGNOSIS — E782 Mixed hyperlipidemia: Secondary | ICD-10-CM | POA: Diagnosis not present

## 2022-09-23 DIAGNOSIS — I10 Essential (primary) hypertension: Secondary | ICD-10-CM | POA: Diagnosis not present

## 2022-10-01 ENCOUNTER — Encounter: Payer: Self-pay | Admitting: Internal Medicine

## 2022-10-01 ENCOUNTER — Ambulatory Visit (INDEPENDENT_AMBULATORY_CARE_PROVIDER_SITE_OTHER): Payer: Medicare HMO

## 2022-10-01 ENCOUNTER — Ambulatory Visit: Payer: Medicare HMO | Attending: Internal Medicine | Admitting: Internal Medicine

## 2022-10-01 VITALS — BP 126/68 | HR 60 | Ht 62.0 in | Wt 189.4 lb

## 2022-10-01 DIAGNOSIS — I442 Atrioventricular block, complete: Secondary | ICD-10-CM

## 2022-10-01 DIAGNOSIS — Z950 Presence of cardiac pacemaker: Secondary | ICD-10-CM | POA: Diagnosis not present

## 2022-10-01 DIAGNOSIS — I428 Other cardiomyopathies: Secondary | ICD-10-CM

## 2022-10-01 LAB — CUP PACEART REMOTE DEVICE CHECK
Battery Remaining Longevity: 38 mo
Battery Voltage: 2.96 V
Brady Statistic AP VP Percent: 32.63 %
Brady Statistic AP VS Percent: 0 %
Brady Statistic AS VP Percent: 67.37 %
Brady Statistic AS VS Percent: 0 %
Brady Statistic RA Percent Paced: 32.62 %
Brady Statistic RV Percent Paced: 100 %
Date Time Interrogation Session: 20240425105749
Implantable Lead Connection Status: 753985
Implantable Lead Connection Status: 753985
Implantable Lead Implant Date: 20160707
Implantable Lead Implant Date: 20160707
Implantable Lead Location: 753859
Implantable Lead Location: 753860
Implantable Lead Model: 5076
Implantable Lead Model: 5076
Implantable Pulse Generator Implant Date: 20160707
Lead Channel Impedance Value: 342 Ohm
Lead Channel Impedance Value: 361 Ohm
Lead Channel Impedance Value: 646 Ohm
Lead Channel Impedance Value: 684 Ohm
Lead Channel Pacing Threshold Amplitude: 0.5 V
Lead Channel Pacing Threshold Amplitude: 0.625 V
Lead Channel Pacing Threshold Pulse Width: 0.4 ms
Lead Channel Pacing Threshold Pulse Width: 0.4 ms
Lead Channel Sensing Intrinsic Amplitude: 27.5 mV
Lead Channel Sensing Intrinsic Amplitude: 27.5 mV
Lead Channel Sensing Intrinsic Amplitude: 3.125 mV
Lead Channel Sensing Intrinsic Amplitude: 3.125 mV
Lead Channel Setting Pacing Amplitude: 1.5 V
Lead Channel Setting Pacing Amplitude: 2.5 V
Lead Channel Setting Pacing Pulse Width: 0.4 ms
Lead Channel Setting Sensing Sensitivity: 1.2 mV
Zone Setting Status: 755011
Zone Setting Status: 755011

## 2022-10-01 NOTE — Progress Notes (Signed)
.      Patient Care Team: Carin Hock, [place] as PCP - General (Physician Assistant) Duke Salvia, MD as PCP - Electrophysiology (Cardiology) Rankins, Fanny Dance, MD (Family Medicine) Kennon Rounds as Physician Assistant (Cardiology) Sheilah Pigeon, PA-C as Physician Assistant (Clinical Cardiac Electrophysiology)   HPI  [name] [name] is a 62 y.o. female Seen in follow-up for pacemaker implanted 2016 Medtronic for intermittent complete heart block, a history of "seizures" associated with documented bradycardia and complete heart block in  the setting of underlying left bundle branch block.  Discussions with neurology thereafter prompted the discontinuation of her antiepileptics; however, further discussions with her primary neurologist redirected that plan the thought that drugs would be weaned over 6 months. The decision has been made to continue the meds   The patient denies chest pain, nocturnal dyspnea, orthopnea or peripheral edema.  There have been no palpitations, lightheadedness or syncope.  Complains of mild dyspnea and fatigue.     Date Cr K Hgb  2/24 1.22 4.3 13.7    DATE TEST EF   7/16 Echo  50-55%   1/22 Echo  45-50%   4/22 Echo   55 %   10/23 Echo   45-50 %               Past Medical History:  Diagnosis Date   Arthritis    CHF (congestive heart failure) (HCC)    Chronic cough    Chronic insomnia 03/07/2015   Diverticulitis    GERD (gastroesophageal reflux disease)    Gout    Hiatal hernia    Hypertension    LBBB (left bundle branch block)    Pacemaker    Medtronic due to heart block   Panic disorder 09/07/2014   Pneumonia    x 1 - Walking   Seizures (HCC)    Last Seizure in 2017 - controlled with meds    Sleep apnea    does not use a CPAP due to grand mal seizure   Wrist tendonitis    R     Past Surgical History:  Procedure Laterality Date   COLONOSCOPY  last 03/31/2016   COLONOSCOPY WITH PROPOFOL N/A 04/04/2019    Procedure: COLONOSCOPY WITH PROPOFOL;  Surgeon: Napoleon Form, MD;  Location: [facility] ENDOSCOPY;  Service: Endoscopy;  Laterality: N/A;   COLONOSCOPY WITH PROPOFOL N/A 06/15/2019   Procedure: COLONOSCOPY WITH PROPOFOL;  Surgeon: Meridee Score Netty Starring., MD;  Location: [facility] ENDOSCOPY;  Service: Gastroenterology;  Laterality: N/A;   COLONOSCOPY WITH PROPOFOL N/A 02/28/2020   Procedure: COLONOSCOPY WITH PROPOFOL;  Surgeon: Meridee Score Netty Starring., MD;  Location: [facility] ENDOSCOPY;  Service: Gastroenterology;  Laterality: N/A;   COLONOSCOPY WITH PROPOFOL N/A 06/11/2022   Procedure: COLONOSCOPY WITH PROPOFOL;  Surgeon: Meridee Score Netty Starring., MD;  Location: [facility] ENDOSCOPY;  Service: Gastroenterology;  Laterality: N/A;   ENDOROTOR  02/28/2020   Procedure: ZOXWRUEAV;  Surgeon: Mansouraty, Netty Starring., MD;  Location: [facility] ENDOSCOPY;  Service: Gastroenterology;;   ENDOSCOPIC MUCOSAL RESECTION N/A 06/15/2019   Procedure: ENDOSCOPIC MUCOSAL RESECTION;  Surgeon: Lemar Lofty., MD;  Location: [facility] ENDOSCOPY;  Service: Gastroenterology;  Laterality: N/A;   ENDOSCOPIC MUCOSAL RESECTION N/A 02/28/2020   Procedure: ENDOSCOPIC MUCOSAL RESECTION;  Surgeon: Meridee Score Netty Starring., MD;  Location: [facility] ENDOSCOPY;  Service: Gastroenterology;  Laterality: N/A;   ENDOSCOPIC MUCOSAL RESECTION N/A 06/11/2022   Procedure: ENDOSCOPIC MUCOSAL RESECTION;  Surgeon: Meridee Score Netty Starring., MD;  Location: [facility] ENDOSCOPY;  Service: Gastroenterology;  Laterality: N/A;  EP IMPLANTABLE DEVICE N/A 12/13/2014   Procedure: Pacemaker Implant;  Surgeon: Duke Salvia, MD;  Location: [facility] INVASIVE CV LAB;  Service: Cardiovascular;  Laterality: N/A;   HAND SURGERY     HEMOSTASIS CLIP PLACEMENT  06/15/2019   Procedure: HEMOSTASIS CLIP PLACEMENT;  Surgeon: Lemar Lofty., MD;  Location: [facility] ENDOSCOPY;  Service: Gastroenterology;;   HEMOSTASIS CLIP PLACEMENT  02/28/2020   Procedure: HEMOSTASIS CLIP PLACEMENT;  Surgeon: Lemar Lofty., MD;  Location: [facility] ENDOSCOPY;  Service: Gastroenterology;;   HEMOSTASIS CLIP PLACEMENT  06/11/2022   Procedure: HEMOSTASIS CLIP PLACEMENT;  Surgeon: Lemar Lofty., MD;  Location: [facility] ENDOSCOPY;  Service: Gastroenterology;;   HEMOSTASIS CONTROL  02/28/2020   Procedure: HEMOSTASIS CONTROL;  Surgeon: Lemar Lofty., MD;  Location: [facility] ENDOSCOPY;  Service: Gastroenterology;;   POLYPECTOMY     POLYPECTOMY  04/04/2019   Procedure: POLYPECTOMY;  Surgeon: Napoleon Form, MD;  Location: [facility] ENDOSCOPY;  Service: Endoscopy;;   POLYPECTOMY  06/15/2019   Procedure: POLYPECTOMY;  Surgeon: Lemar Lofty., MD;  Location[facility] ENDOSCOPY;  Service: Gastroenterology;;   POLYPECTOMY  06/11/2022   Procedure: POLYPECTOMY;  Surgeon: Lemar Lofty., MD;  Location: [facility] ENDOSCOPY;  Service: Gastroenterology;;   SKIN CANCER EXCISION     SUBMUCOSAL LIFTING INJECTION  06/15/2019   Procedure: SUBMUCOSAL LIFTING INJECTION;  Surgeon: Lemar Lofty., MD;  Location: [facility] ENDOSCOPY;  Service: Gastroenterology;;   SUBMUCOSAL LIFTING INJECTION  06/11/2022   Procedure: SUBMUCOSAL LIFTING INJECTION;  Surgeon: Lemar Lofty., MD;  Location: [facility] ENDOSCOPY;  Service: Gastroenterology;;   TUBAL LIGATION     UPPER GI ENDOSCOPY     WISDOM TOOTH EXTRACTION      Current Outpatient Medications  Medication Sig Dispense Refill   acetaminophen (TYLENOL) 500 MG tablet Take 500-1,000 mg by mouth every 6 (six) hours as needed for moderate pain (pain).      albuterol (VENTOLIN HFA) 108 (90 Base) MCG/ACT inhaler Inhale 2 puffs into the lungs every 6 (six) hours as needed for wheezing or shortness of breath. 8 g 11   amLODipine (NORVASC) 5 MG tablet Take 5 mg by mouth daily.     atorvastatin (LIPITOR) 20 MG tablet Take 20 mg by mouth daily.     benzonatate (TESSALON) 200 MG capsule Take 1 capsule (200 mg total) by mouth 2 (two) times daily as needed for cough. 60 capsule 3   busPIRone (BUSPAR)  10 MG tablet Take 10 mg by mouth 2 (two) times daily.     carvedilol (COREG) 3.125 MG tablet TAKE 1 TABLET(3.125 MG) BY MOUTH TWICE DAILY 180 tablet 3   Cholecalciferol (VITAMIN D3) 2000 units capsule Take 1 capsule (2,000 Units total) by mouth daily. 90 capsule 0   escitalopram (LEXAPRO) 20 MG tablet Take 1 tablet (20 mg total) by mouth daily. 30 tablet 0   gabapentin (NEURONTIN) 300 MG capsule 100 mg 2 (two) times daily.     lamoTRIgine (LAMICTAL) 200 MG tablet Take 1 tablet (200 mg total) by mouth 2 (two) times daily. 180 tablet 3   lamoTRIgine (LAMICTAL) 25 MG tablet Take 2 tablets (50 mg total) by mouth at bedtime. 180 tablet 3   losartan (COZAAR) 25 MG tablet Take by mouth.     montelukast (SINGULAIR) 10 MG tablet Take 10 mg by mouth at bedtime.     Oxcarbazepine (TRILEPTAL) 300 MG tablet Take 1 tablet (300 mg total) by mouth 2 (two) times daily. 180 tablet 3   pantoprazole (PROTONIX) 40 MG tablet  Take 1 tablet by mouth daily.     Spacer/Aero-Holding Chambers DEVI 2 Inhalers by Does not apply route in the morning and at bedtime. 1 each 1   spironolactone (ALDACTONE) 25 MG tablet Take 0.5 tablets (12.5 mg total) by mouth daily. 45 tablet 3   No current facility-administered medications for this visit.    Allergies  Allergen Reactions   Zonisamide Anxiety    Panic Attacks   Aspirin Hives    Upset stomach   Ciprofloxacin     Seizures & it keeps her awake   Codeine Hives   Hydrocodone Hives   Oxycodone Hives   Penicillins Hives and Swelling    Did it involve swelling of the face/tongue/throat, SOB, or low BP? Yes Did it involve sudden or severe rash/hives, skin peeling, or any reaction on the inside of your mouth or nose? Yes Did you need to seek medical attention at a hospital or doctor's office? No When did it last happen? 1990's   If all above answers are "NO", may proceed with cephalosporin use. Pt tolerated ampicillin/sulbactam without issues 06/2020      Review of  Systems negative except from HPI and PMH  Physical Exam  BP 126/68   Pulse 60   Ht  (1.575 m)   Wt 189 lb 6.4 oz (85.9 kg)   LMP  (LMP Unknown)   SpO2 98%   BMI 34.64 kg/m  Well developed and well nourished in no acute distress HENT normal Neck supple with JVP-flat Clear Device pocket well healed; without hematoma or erythema.  There is no tethering  Regular rate and rhythm, no  murmur Abd-soft with active BS No Clubbing cyanosis  edema Skin-warm and dry A & Oriented  Grossly normal sensory and motor function  ECG AV pacing at 60 Intervals 21/16/53  Device function is normal. No Programming changes    See Paceart for details    Assessment and  Plan  Complete heart block  Pacemaker. Medtronic   Cardiomyopathy mild   Seizures  Hypertension   Blood pressure well-controlled.  Continue carvedilol 3.125 and losartan 25 The concern with her ejection fraction is that now she is ventricularly paced that it might worsen.  Will need to keep a close eye on this.  Will see her in 6 months with an echocardiogram at that time --continue the guideline directed therapy noted above for cardiomyopathy    Ventricular pacing is now at 100% the device is been reprogrammed previously from MVP-DDD following development of device dependence 12/22 (RA)

## 2022-10-01 NOTE — Patient Instructions (Signed)
Medication Instructions:  Your physician recommends that you continue on your current medications as directed. Please refer to the Current Medication list given to you today.  *If you need a refill on your cardiac medications before your next appointment, please call your pharmacy*   Lab Work: None ordered.  If you have labs (blood work) drawn today and your tests are completely normal, you will receive your results only by: MyChart Message (if you have MyChart) OR A paper copy in the mail If you have any lab test that is abnormal or we need to change your treatment, we will call you to review the results.   Testing/Procedures: None ordered.    Follow-Up: At [facility], you and your health needs are our priority.  As part of our continuing mission to provide you with exceptional heart care, we have created designated Provider Care Teams.  These Care Teams include your primary Cardiologist (physician) and Advanced Practice Providers (APPs -  Physician Assistants and Nurse Practitioners) who all work together to provide you with the care you need, when you need it.  We recommend signing up for the patient portal called "MyChart".  Sign up information is provided on this After Visit Summary.  MyChart is used to connect with patients for Virtual Visits (Telemedicine).  Patients are able to view lab/test results, encounter notes, upcoming appointments, etc.  Non-urgent messages can be sent to your provider as well.   To learn more about what you can do with MyChart, go to [email or web address].    Your next appointment:   6 months with Dr Odessa Fleming PA, Francis Dowse

## 2022-10-06 DIAGNOSIS — M1712 Unilateral primary osteoarthritis, left knee: Secondary | ICD-10-CM | POA: Diagnosis not present

## 2022-10-06 DIAGNOSIS — I509 Heart failure, unspecified: Secondary | ICD-10-CM | POA: Diagnosis not present

## 2022-10-06 DIAGNOSIS — Z6833 Body mass index (BMI) 33.0-33.9, adult: Secondary | ICD-10-CM | POA: Diagnosis not present

## 2022-10-06 DIAGNOSIS — G40909 Epilepsy, unspecified, not intractable, without status epilepticus: Secondary | ICD-10-CM | POA: Diagnosis not present

## 2022-10-27 NOTE — Progress Notes (Signed)
Remote pacemaker transmission.   

## 2022-11-02 NOTE — Progress Notes (Unsigned)
Cardiology Office Note:    Date:  11/03/2022  ID:  [name] [name], DOB 1960/07/03, MRN [id number] PCP: Carin Hock, PA  [facility] Providers Cardiologist:  None Cardiology APP:  Beatrice Lecher, PA-C  Electrophysiologist:  Sherryl Manges, MD  Electrophysiology APP:  Sheilah Pigeon, PA-C          Patient Profile:   HFmrEF (heart failure with mildly reduced ejection fraction) Dx during admx for pneumonia in 06/2020 - prob stress related TTE 06/21/20: EF 40-45 Limited TTE 09/18/20: EF 55 TTE 04/02/22: EF 45-50, global HK, Gr 1 DD, NL RVSF, NL PASP (RVSP 33.9), mild LAE, RAP 3 Left Bundle Branch Block Complete Heart Block s/p Pacemaker 2016 Hypertension  Chronic kidney disease stage 3 (eGFR 57) OSA Seizure d/o Chronic cough (followed by pulmonology - Dr. Judeth Horn) Myoview 10/23/14: no ischemia, low risk EF 58       History of Present Illness:   [name] [name] is a 62 y.o. female who returns for f/u of CHF. She was last seen 06/17/22. She saw Dr. Graciela Husbands in April 2024. There is concern that continuous pacing may result in lower EF. He plans to see her back in 6 mos with a f/u echocardiogram.  She is here alone.  She is doing well without chest pain, shortness of breath, syncope, orthopnea, leg edema.  Review of Systems  Cardiovascular:  Positive for claudication (L leg).  Gastrointestinal:  Negative for hematochezia and melena.  Genitourinary:  Negative for hematuria.    See HPI    Studies Reviewed:    EKG: Not done  Risk Assessment/Calculations:             Physical Exam:   VS:  BP 130/60   Pulse 74   Ht 5' 1.5" (1.562 m)   Wt 187 lb 3.2 oz (84.9 kg)   LMP  (LMP Unknown)   SpO2 96%   BMI 34.80 kg/m    Wt Readings from Last 3 Encounters:  11/03/22 187 lb 3.2 oz (84.9 kg)  10/01/22 189 lb 6.4 oz (85.9 kg)  08/05/22 182 lb 9.6 oz (82.8 kg)    Physical Exam    ASSESSMENT AND PLAN:   Heart failure with mildly reduced ejection fraction (HFmrEF)  (HCC) EF ~ 50. She is NYHA II. Volume status stable.  We discussed the "4 pillars" of heart failure management.  She is already on beta-blocker, ARB and MRA.  We discussed the benefits of SGLT2 inhibitors.  We also discussed the potential for GU side effects. Continue valsartan 320 mg daily, Coreg 3.125 mg twice daily, spironolactone 12.5 mg daily Start Farxiga 10 mg daily BMET in 3 weeks If blood pressure difficult to control, consider changing valsartan to Entresto Follow-up in October as planned with the EP Obtain echocardiogram prior to next visit with the EP in October Follow-up with me in April 2025  Complete heart block [facility]) S/p Pacemaker. F/u with EP as planned.   Essential hypertension Blood pressure well-controlled.  Continue amlodipine 5 mg daily, carvedilol 3.25 mg twice daily, valsartan 320 mg daily, spironolactone 12.5 mg daily.  Claudication [facility]) She notes left thigh discomfort with ambulation that improves with rest.  She does not have any symptoms of critical limb ischemia.  She is a smoker.  Obtain ABIs and left lower extremity arterial Dopplers.      Dispo:  Return in about 5 months (around 04/05/2023) for Follow Up after echo w Dr. Graciela Husbands or Francis Dowse, PA then follow up  with Tereso Newcomer, PA-C 09/2023.  Signed, Tereso Newcomer, PA-C

## 2022-11-03 ENCOUNTER — Other Ambulatory Visit: Payer: Self-pay | Admitting: *Deleted

## 2022-11-03 ENCOUNTER — Ambulatory Visit: Payer: Medicare HMO | Attending: Physician Assistant | Admitting: Physician Assistant

## 2022-11-03 ENCOUNTER — Encounter: Payer: Self-pay | Admitting: Physician Assistant

## 2022-11-03 VITALS — BP 130/60 | HR 74 | Ht 61.5 in | Wt 187.2 lb

## 2022-11-03 DIAGNOSIS — I739 Peripheral vascular disease, unspecified: Secondary | ICD-10-CM

## 2022-11-03 DIAGNOSIS — I5022 Chronic systolic (congestive) heart failure: Secondary | ICD-10-CM | POA: Diagnosis not present

## 2022-11-03 DIAGNOSIS — I442 Atrioventricular block, complete: Secondary | ICD-10-CM

## 2022-11-03 DIAGNOSIS — I10 Essential (primary) hypertension: Secondary | ICD-10-CM | POA: Diagnosis not present

## 2022-11-03 HISTORY — DX: Peripheral vascular disease, unspecified: I73.9

## 2022-11-03 MED ORDER — DAPAGLIFLOZIN PROPANEDIOL 10 MG PO TABS: 10.0000 mg | ORAL_TABLET | Freq: Every day | ORAL | 0 refills | Status: DC

## 2022-11-03 MED ORDER — DAPAGLIFLOZIN PROPANEDIOL 10 MG PO TABS: 10.0000 mg | ORAL_TABLET | Freq: Every day | ORAL | 3 refills | Status: DC

## 2022-11-03 NOTE — Assessment & Plan Note (Signed)
Blood pressure well-controlled.  Continue amlodipine 5 mg daily, carvedilol 3.25 mg twice daily, valsartan 320 mg daily, spironolactone 12.5 mg daily.

## 2022-11-03 NOTE — Assessment & Plan Note (Addendum)
She notes left thigh discomfort with ambulation that improves with rest.  She does not have any symptoms of critical limb ischemia.  She is a smoker.  Obtain ABIs and left lower extremity arterial Dopplers.

## 2022-11-03 NOTE — Patient Instructions (Addendum)
Medication Instructions:  Your physician has recommended you make the following change in your medication:   START Farxiga 10 taking 1 daily  *If you need a refill on your cardiac medications before your next appointment, please call your pharmacy*   Lab Work: 3 WEEKS:  BMET  If you have labs (blood work) drawn today and your tests are completely normal, you will receive your results only by: MyChart Message (if you have MyChart) OR A paper copy in the mail If you have any lab test that is abnormal or we need to change your treatment, we will call you to review the results.   Testing/Procedures: Your physician has requested that you have an echocardiogram IN OCTOBER. Echocardiography is a painless test that uses sound waves to create images of your heart. It provides your doctor with information about the size and shape of your heart and how well your heart's chambers and valves are working. This procedure takes approximately one hour. There are no restrictions for this procedure. Please do NOT wear cologne, perfume, aftershave, or lotions (deodorant is allowed). Please arrive 15 minutes prior to your appointment time.  Your physician has requested that you have a lower or upper extremity arterial duplex. This test is an ultrasound of the arteries in the legs or arms. It looks at arterial blood flow in the legs and arms. Allow one hour for Lower and Upper Arterial scans. There are no restrictions or special instructions     Follow-Up: At [facility], you and your health needs are our priority.  As part of our continuing mission to provide you with exceptional heart care, we have created designated Provider Care Teams.  These Care Teams include your primary Cardiologist (physician) and Advanced Practice Providers (APPs -  Physician Assistants and Nurse Practitioners) who all work together to provide you with the care you need, when you need it.  We recommend signing up for the  patient portal called "MyChart".  Sign up information is provided on this After Visit Summary.  MyChart is used to connect with patients for Virtual Visits (Telemedicine).  Patients are able to view lab/test results, encounter notes, upcoming appointments, etc.  Non-urgent messages can be sent to your provider as well.   To learn more about what you can do with MyChart, go to [email or web address].    Your next appointment:   5 month(s)  Provider:   Sherryl Manges, MD or Francis Dowse, PA-C    Other Instructions

## 2022-11-03 NOTE — Assessment & Plan Note (Addendum)
EF ~ 50. She is NYHA II. Volume status stable.  We discussed the "4 pillars" of heart failure management.  She is already on beta-blocker, ARB and MRA.  We discussed the benefits of SGLT2 inhibitors.  We also discussed the potential for GU side effects. Continue valsartan 320 mg daily, Coreg 3.125 mg twice daily, spironolactone 12.5 mg daily Start Farxiga 10 mg daily BMET in 3 weeks If blood pressure difficult to control, consider changing valsartan to Entresto Follow-up in October as planned with the EP Obtain echocardiogram prior to next visit with the EP in October Follow-up with me in April 2025

## 2022-11-03 NOTE — Assessment & Plan Note (Signed)
S/p Pacemaker. F/u with EP as planned.  

## 2022-11-11 ENCOUNTER — Encounter (HOSPITAL_COMMUNITY): Payer: Medicare HMO

## 2022-11-13 ENCOUNTER — Other Ambulatory Visit: Payer: Self-pay | Admitting: *Deleted

## 2022-11-16 ENCOUNTER — Ambulatory Visit (HOSPITAL_COMMUNITY)
Admission: RE | Admit: 2022-11-16 | Payer: Medicare HMO | Source: Ambulatory Visit | Attending: Physician Assistant | Admitting: Physician Assistant

## 2022-11-24 ENCOUNTER — Ambulatory Visit: Payer: Medicare HMO | Attending: Physician Assistant

## 2022-11-24 DIAGNOSIS — I5022 Chronic systolic (congestive) heart failure: Secondary | ICD-10-CM | POA: Diagnosis not present

## 2022-11-24 LAB — BASIC METABOLIC PANEL
BUN/Creatinine Ratio: 9 — ABNORMAL LOW (ref 12–28)
BUN: 13 mg/dL (ref 8–27)
CO2: 23 mmol/L (ref 20–29)
Calcium: 9.7 mg/dL (ref 8.7–10.3)
Chloride: 104 mmol/L (ref 96–106)
Creatinine, Ser: 1.39 mg/dL — ABNORMAL HIGH (ref 0.57–1.00)
Glucose: 86 mg/dL (ref 70–99)
Potassium: 4.5 mmol/L (ref 3.5–5.2)
Sodium: 141 mmol/L (ref 134–144)
eGFR: 43 mL/min/{1.73_m2} — ABNORMAL LOW (ref >59)

## 2022-11-25 ENCOUNTER — Telehealth: Payer: Self-pay | Admitting: *Deleted

## 2022-11-25 ENCOUNTER — Other Ambulatory Visit: Payer: Self-pay | Admitting: *Deleted

## 2022-11-25 DIAGNOSIS — Z79899 Other long term (current) drug therapy: Secondary | ICD-10-CM

## 2022-11-25 NOTE — Telephone Encounter (Signed)
-----   Message from Beatrice Lecher, PA-C sent at 11/25/2022  8:33 AM EDT ----- Results sent to [name] [name] via MyChart. See MyChart comments below. PLAN:  -Continue current meds -Repeat BMET 1 week  [name]  Your creatinine (kidney function) has increased somewhat. Your potassium is normal. Continue current medications. I will repeat your labs to keep an eye on this.  Tereso Newcomer, PA-C

## 2022-12-02 ENCOUNTER — Ambulatory Visit (HOSPITAL_COMMUNITY)
Admission: RE | Admit: 2022-12-02 | Discharge: 2022-12-02 | Disposition: A | Discharge status: Home / Self Care | Payer: Medicare HMO | Source: Ambulatory Visit | Attending: Physician Assistant | Admitting: Physician Assistant

## 2022-12-02 DIAGNOSIS — I739 Peripheral vascular disease, unspecified: Secondary | ICD-10-CM | POA: Insufficient documentation

## 2022-12-02 DIAGNOSIS — Z79899 Other long term (current) drug therapy: Secondary | ICD-10-CM | POA: Diagnosis not present

## 2022-12-03 LAB — BASIC METABOLIC PANEL
BUN/Creatinine Ratio: 9 — ABNORMAL LOW (ref 12–28)
BUN: 12 mg/dL (ref 8–27)
CO2: 22 mmol/L (ref 20–29)
Calcium: 9.6 mg/dL (ref 8.7–10.3)
Chloride: 101 mmol/L (ref 96–106)
Creatinine, Ser: 1.36 mg/dL — ABNORMAL HIGH (ref 0.57–1.00)
Glucose: 89 mg/dL (ref 70–99)
Potassium: 5.1 mmol/L (ref 3.5–5.2)
Sodium: 140 mmol/L (ref 134–144)
eGFR: 44 mL/min/{1.73_m2} — ABNORMAL LOW (ref >59)

## 2022-12-03 LAB — VAS US ABI WITH/WO TBI
Left ABI: 0.62
Right ABI: 0.97

## 2022-12-04 ENCOUNTER — Encounter: Payer: Self-pay | Admitting: Physician Assistant

## 2022-12-07 NOTE — Progress Notes (Signed)
Pt has been made aware result and recommendations.  She verbalized understanding.  jw

## 2022-12-11 ENCOUNTER — Encounter: Payer: Self-pay | Admitting: Cardiovascular Disease

## 2022-12-11 ENCOUNTER — Ambulatory Visit: Payer: Medicare HMO | Attending: Cardiovascular Disease | Admitting: Cardiovascular Disease

## 2022-12-11 VITALS — BP 120/70 | HR 70 | Ht 61.5 in | Wt 186.6 lb

## 2022-12-11 DIAGNOSIS — I739 Peripheral vascular disease, unspecified: Secondary | ICD-10-CM | POA: Diagnosis not present

## 2022-12-11 DIAGNOSIS — Z01818 Encounter for other preprocedural examination: Secondary | ICD-10-CM | POA: Diagnosis not present

## 2022-12-11 NOTE — Patient Instructions (Signed)
Medication Instructions:  Your physician recommends that you continue on your current medications as directed. Please refer to the Current Medication list given to you today.  *If you need a refill on your cardiac medications before your next appointment, please call your pharmacy*   Lab Work: Your physician recommends that you have labs drawn today: BMET & CBC  If you have labs (blood work) drawn today and your tests are completely normal, you will receive your results only by: MyChart Message (if you have MyChart) OR A paper copy in the mail If you have any lab test that is abnormal or we need to change your treatment, we will call you to review the results.   Testing/Procedures: Your physician has requested that you have a lower extremity arterial duplex. During this test, ultrasound is used to evaluate arterial blood flow in the legs. Allow one hour for this exam. There are no restrictions or special instructions. This will take place at [address] [facility], [address]. To be done 1-2 weeks after your procedure. (8/1)   Your physician has requested that you have an ankle brachial index (ABI). During this test an ultrasound and blood pressure cuff are used to evaluate the arteries that supply the arms and legs with blood. Allow thirty minutes for this exam. There are no restrictions or special instructions. This will take place at [address] [facility], [address]. To be done 1-2 weeks after your procedure. (8/1)   Follow-Up: At [facility], you and your health needs are our priority.  As part of our continuing mission to provide you with exceptional heart care, we have created designated Provider Care Teams.  These Care Teams include your primary Cardiologist (physician) and Advanced Practice Providers (APPs -  Physician Assistants and Nurse Practitioners) who all work together to provide you with the care you need, when you need it.  We recommend signing up for the patient portal  called "MyChart".  Sign up information is provided on this After Visit Summary.  MyChart is used to connect with patients for Virtual Visits (Telemedicine).  Patients are able to view lab/test results, encounter notes, upcoming appointments, etc.  Non-urgent messages can be sent to your provider as well.   To learn more about what you can do with MyChart, go to [email or web address].    Your next appointment:   2-3 week(s) after your procedure (8/1)  Provider:   Nanetta Batty, MD    Other Instructions       Cardiac/Peripheral Catheterization   You are scheduled for a Peripheral Angiogram on Thursday, August 1 with Dr. Nanetta Batty.  1. Please arrive at the [facility] (Main Entrance A) at [facility]: [address] [place], [place] [address] at 5:30 AM (This time is 2 hour(s) before your procedure to ensure your preparation). Free valet parking service is available. You will check in at ADMITTING. The support person will be asked to wait in the waiting room.  It is OK to have someone drop you off and come back when you are ready to be discharged.        Special note: Every effort is made to have your procedure done on time. Please understand that emergencies sometimes delay scheduled procedures.  2. Diet: Do not eat solid foods after midnight.  You may have clear liquids until 5 AM the day of the procedure.  3. Labs: You will need to have blood drawn today. (7/5)  4. Medication instructions in preparation for your  procedure:   Stop taking, Spironolactone  Thursday, August 1,   Stop taking Marcelline Deist on Monday, July 29th  On the morning of your procedure, take Aspirin 81 mg and any morning medicines NOT listed above.  You may use sips of water.  5. Plan to go home the same day, you will only stay overnight if medically necessary. 6. You MUST have a responsible adult to drive you home. 7. An adult MUST be with you the first 24 hours after you arrive home. 8. Bring a  current list of your medications, and the last time and date medication taken. 9. Bring ID and current insurance cards. 10.Please wear clothes that are easy to get on and off and wear slip-on shoes.  Thank you for allowing [place] to care for you!   -- [facility]

## 2022-12-11 NOTE — Assessment & Plan Note (Signed)
[name] [name] was referred to me by Tereso Newcomer, PA-C for evaluation of PAD.  She is a patient of Dr. Odessa Fleming.  She has had a pacemaker placed in the past.  She does complain of left greater than right lower extremity claudication for the last several months.  She does have risk factors for vascular disease as well.  She had Doppler studies performed in our office 12/02/2022 revealing a right ABI of 0.97 and left of 0.62 with a high-frequency signal in the left external iliac artery.  This is lifestyle limiting and she wishes to proceed with outpatient peripheral angiography and endovascular therapy.

## 2022-12-11 NOTE — Progress Notes (Signed)
12/11/2022 NYA BALAM   12/09/1960  [id number]  Primary Physician Carin Hock, PA Primary Cardiologist: Runell Gess MD FACP, [place], [place], [place]  HPI:  [name] [name] is a 62 y.o. moderately overweight divorced Caucasian female with no children who is accompanied by her twin Sister [name] [name] today.  She was referred by Tereso Newcomer, PA-C-see for evaluation of lifestyle-limiting claudication.  She is disabled because of epilepsy.  Her risk factors include 25 pack years of tobacco abuse smoking 1/2 pack/day recalcitrant to risk factor modification.  History of hypertension and hyperlipidemia.  One of her sisters has had coronary intervention with stents.  She is never had a heart attack or stroke.  She denies chest pain or shortness of breath.  She has noticed left greater than right lower extremity claudication for the last several months with recent Doppler studies performed 12/02/2022 revealing a decreased left ABI of 0.62 with a high-frequency signal in her left extrailiac artery.   Current Meds  Medication Sig   acetaminophen (TYLENOL) 500 MG tablet Take 500-1,000 mg by mouth every 6 (six) hours as needed for moderate pain (pain).    albuterol (VENTOLIN HFA) 108 (90 Base) MCG/ACT inhaler Inhale 2 puffs into the lungs every 6 (six) hours as needed for wheezing or shortness of breath.   amLODipine (NORVASC) 5 MG tablet Take 5 mg by mouth daily.   atorvastatin (LIPITOR) 20 MG tablet Take 20 mg by mouth daily.   benzonatate (TESSALON) 200 MG capsule Take 1 capsule (200 mg total) by mouth 2 (two) times daily as needed for cough.   busPIRone (BUSPAR) 10 MG tablet Take 10 mg by mouth 2 (two) times daily.   carvedilol (COREG) 3.125 MG tablet TAKE 1 TABLET(3.125 MG) BY MOUTH TWICE DAILY   Cholecalciferol (VITAMIN D3) 2000 units capsule Take 1 capsule (2,000 Units total) by mouth daily.   dapagliflozin propanediol (FARXIGA) 10 MG TABS tablet Take 1 tablet (10 mg total) by mouth daily  before breakfast.   dapagliflozin propanediol (FARXIGA) 10 MG TABS tablet Take 1 tablet (10 mg total) by mouth daily before breakfast.   escitalopram (LEXAPRO) 20 MG tablet Take 1 tablet (20 mg total) by mouth daily.   lamoTRIgine (LAMICTAL) 200 MG tablet Take 1 tablet (200 mg total) by mouth 2 (two) times daily.   lamoTRIgine (LAMICTAL) 25 MG tablet Take 2 tablets (50 mg total) by mouth at bedtime.   montelukast (SINGULAIR) 10 MG tablet Take 10 mg by mouth at bedtime.   Oxcarbazepine (TRILEPTAL) 300 MG tablet Take 1 tablet (300 mg total) by mouth 2 (two) times daily.   pantoprazole (PROTONIX) 40 MG tablet Take 1 tablet by mouth daily.   Spacer/Aero-Holding Chambers DEVI 2 Inhalers by Does not apply route in the morning and at bedtime.   spironolactone (ALDACTONE) 25 MG tablet Take 0.5 tablets (12.5 mg total) by mouth daily.   valsartan (DIOVAN) 160 MG tablet Take 160 mg by mouth 2 (two) times daily.     Allergies  Allergen Reactions   Zonisamide Anxiety    Panic Attacks   Aspirin Hives    Upset stomach   Ciprofloxacin     Seizures & it keeps her awake   Codeine Hives   Hydrocodone Hives   Oxycodone Hives   Penicillins Hives and Swelling    Did it involve swelling of the face/tongue/throat, SOB, or low BP? Yes Did it involve sudden or severe rash/hives, skin peeling, or any reaction on the inside of  your mouth or nose? Yes Did you need to seek medical attention at a hospital or doctor's office? No When did it last happen? 1990's   If all above answers are "NO", may proceed with cephalosporin use. Pt tolerated ampicillin/sulbactam without issues 06/2020    Social History   Socioeconomic History   Marital status: Single    Spouse name: Not on file   Number of children: 0   Years of education: 14   Highest education level: Not on file  Occupational History   Not on file  Tobacco Use   Smoking status: Some Days    Packs/day: 0.25    Years: 45.00    Additional pack years:  0.00    Total pack years: 11.25    Types: Cigarettes    Start date: 71    Passive exposure: Current   Smokeless tobacco: Never   Tobacco comments:    Pt states she smokes 2 cigs a day and sometime 4 a week  Vaping Use   Vaping Use: Never used  Substance and Sexual Activity   Alcohol use: Not Currently    Comment: Quit 12/07/2015   Drug use: Never   Sexual activity: Not Currently    Birth control/protection: Post-menopausal  Other Topics Concern   Not on file  Social History Narrative   Lives with sister   Left handed   Drinks 1-2 cups caffeine daily   Social Determinants of Health   Financial Resource Strain: Not on file  Food Insecurity: Not on file  Transportation Needs: Not on file  Physical Activity: Not on file  Stress: Not on file  Social Connections: Not on file  Intimate Partner Violence: Not on file     Review of Systems: General: negative for chills, fever, night sweats or weight changes.  Cardiovascular: negative for chest pain, dyspnea on exertion, edema, orthopnea, palpitations, paroxysmal nocturnal dyspnea or shortness of breath Dermatological: negative for rash Respiratory: negative for cough or wheezing Urologic: negative for hematuria Abdominal: negative for nausea, vomiting, diarrhea, bright red blood per rectum, melena, or hematemesis Neurologic: negative for visual changes, syncope, or dizziness All other systems reviewed and are otherwise negative except as noted above.    Blood pressure 120/70, pulse 70, height 5' 1.5" (1.562 m), weight 186 lb 9.6 oz (84.6 kg), SpO2 94 %.  General appearance: alert and no distress Neck: no adenopathy, no carotid bruit, no JVD, supple, symmetrical, trachea midline, and thyroid not enlarged, symmetric, no tenderness/mass/nodules Lungs: clear to auscultation bilaterally Heart: regular rate and rhythm, S1, S2 normal, no murmur, click, rub or gallop Extremities: extremities normal, atraumatic, no cyanosis or  edema Pulses: Decreased left pedal pulse Skin: Skin color, texture, turgor normal. No rashes or lesions Neurologic: Grossly normal  EKG not performed today      ASSESSMENT AND PLAN:   PAD (peripheral artery disease) (HCC) [name] was referred to me by Tereso Newcomer, PA-C for evaluation of PAD.  She is a patient of Dr. Odessa Fleming.  She has had a pacemaker placed in the past.  She does complain of left greater than right lower extremity claudication for the last several months.  She does have risk factors for vascular disease as well.  She had Doppler studies performed in our office 12/02/2022 revealing a right ABI of 0.97 and left of 0.62 with a high-frequency signal in the left external iliac artery.  This is lifestyle limiting and she wishes to proceed with outpatient peripheral angiography and endovascular therapy.  Runell Gess MD FACP,FACC,FAHA, [facility] 12/11/2022 9:23 AM

## 2022-12-11 NOTE — H&P (View-Only) (Signed)
12/11/2022 NYA BALAM   12/09/1960  [id number]  Primary Physician [name] Hock, PA Primary Cardiologist: [name] Gess MD FACP, [place], [place], [place]  HPI:  [name] [name] is a 62 y.o. moderately overweight divorced Caucasian female with no children who is accompanied by her twin Sister [name] [name] today.  She was referred by [name] Newcomer, PA-C-see for evaluation of lifestyle-limiting claudication.  She is disabled because of epilepsy.  Her risk factors include 25 pack years of tobacco abuse smoking 1/2 pack/day recalcitrant to risk factor modification.  History of hypertension and hyperlipidemia.  One of her sisters has had coronary intervention with stents.  She is never had a heart attack or stroke.  She denies chest pain or shortness of breath.  She has noticed left greater than right lower extremity claudication for the last several months with recent Doppler studies performed 12/02/2022 revealing a decreased left ABI of 0.62 with a high-frequency signal in her left extrailiac artery.   Current Meds  Medication Sig   acetaminophen (TYLENOL) 500 MG tablet Take 500-1,000 mg by mouth every 6 (six) hours as needed for moderate pain (pain).    albuterol (VENTOLIN HFA) 108 (90 Base) MCG/ACT inhaler Inhale 2 puffs into the lungs every 6 (six) hours as needed for wheezing or shortness of breath.   amLODipine (NORVASC) 5 MG tablet Take 5 mg by mouth daily.   atorvastatin (LIPITOR) 20 MG tablet Take 20 mg by mouth daily.   benzonatate (TESSALON) 200 MG capsule Take 1 capsule (200 mg total) by mouth 2 (two) times daily as needed for cough.   busPIRone (BUSPAR) 10 MG tablet Take 10 mg by mouth 2 (two) times daily.   carvedilol (COREG) 3.125 MG tablet TAKE 1 TABLET(3.125 MG) BY MOUTH TWICE DAILY   Cholecalciferol (VITAMIN D3) 2000 units capsule Take 1 capsule (2,000 Units total) by mouth daily.   dapagliflozin propanediol (FARXIGA) 10 MG TABS tablet Take 1 tablet (10 mg total) by mouth daily  before breakfast.   dapagliflozin propanediol (FARXIGA) 10 MG TABS tablet Take 1 tablet (10 mg total) by mouth daily before breakfast.   escitalopram (LEXAPRO) 20 MG tablet Take 1 tablet (20 mg total) by mouth daily.   lamoTRIgine (LAMICTAL) 200 MG tablet Take 1 tablet (200 mg total) by mouth 2 (two) times daily.   lamoTRIgine (LAMICTAL) 25 MG tablet Take 2 tablets (50 mg total) by mouth at bedtime.   montelukast (SINGULAIR) 10 MG tablet Take 10 mg by mouth at bedtime.   Oxcarbazepine (TRILEPTAL) 300 MG tablet Take 1 tablet (300 mg total) by mouth 2 (two) times daily.   pantoprazole (PROTONIX) 40 MG tablet Take 1 tablet by mouth daily.   Spacer/Aero-Holding Chambers DEVI 2 Inhalers by Does not apply route in the morning and at bedtime.   spironolactone (ALDACTONE) 25 MG tablet Take 0.5 tablets (12.5 mg total) by mouth daily.   valsartan (DIOVAN) 160 MG tablet Take 160 mg by mouth 2 (two) times daily.     Allergies  Allergen Reactions   Zonisamide Anxiety    Panic Attacks   Aspirin Hives    Upset stomach   Ciprofloxacin     Seizures & it keeps her awake   Codeine Hives   Hydrocodone Hives   Oxycodone Hives   Penicillins Hives and Swelling    Did it involve swelling of the face/tongue/throat, SOB, or low BP? Yes Did it involve sudden or severe rash/hives, skin peeling, or any reaction on the inside of  your mouth or nose? Yes Did you need to seek medical attention at a hospital or doctor's office? No When did it last happen? 1990's   If all above answers are "NO", may proceed with cephalosporin use. Pt tolerated ampicillin/sulbactam without issues 06/2020    Social History   Socioeconomic History   Marital status: Single    Spouse name: Not on file   Number of children: 0   Years of education: 14   Highest education level: Not on file  Occupational History   Not on file  Tobacco Use   Smoking status: Some Days    Packs/day: 0.25    Years: 45.00    Additional pack years:  0.00    Total pack years: 11.25    Types: Cigarettes    Start date: 71    Passive exposure: Current   Smokeless tobacco: Never   Tobacco comments:    Pt states she smokes 2 cigs a day and sometime 4 a week  Vaping Use   Vaping Use: Never used  Substance and Sexual Activity   Alcohol use: Not Currently    Comment: Quit 12/07/2015   Drug use: Never   Sexual activity: Not Currently    Birth control/protection: Post-menopausal  Other Topics Concern   Not on file  Social History Narrative   Lives with sister   Left handed   Drinks 1-2 cups caffeine daily   Social Determinants of Health   Financial Resource Strain: Not on file  Food Insecurity: Not on file  Transportation Needs: Not on file  Physical Activity: Not on file  Stress: Not on file  Social Connections: Not on file  Intimate Partner Violence: Not on file     Review of Systems: General: negative for chills, fever, night sweats or weight changes.  Cardiovascular: negative for chest pain, dyspnea on exertion, edema, orthopnea, palpitations, paroxysmal nocturnal dyspnea or shortness of breath Dermatological: negative for rash Respiratory: negative for cough or wheezing Urologic: negative for hematuria Abdominal: negative for nausea, vomiting, diarrhea, bright red blood per rectum, melena, or hematemesis Neurologic: negative for visual changes, syncope, or dizziness All other systems reviewed and are otherwise negative except as noted above.    Blood pressure 120/70, pulse 70, height 5' 1.5" (1.562 m), weight 186 lb 9.6 oz (84.6 kg), SpO2 94 %.  General appearance: alert and no distress Neck: no adenopathy, no carotid bruit, no JVD, supple, symmetrical, trachea midline, and thyroid not enlarged, symmetric, no tenderness/mass/nodules Lungs: clear to auscultation bilaterally Heart: regular rate and rhythm, S1, S2 normal, no murmur, click, rub or gallop Extremities: extremities normal, atraumatic, no cyanosis or  edema Pulses: Decreased left pedal pulse Skin: Skin color, texture, turgor normal. No rashes or lesions Neurologic: Grossly normal  EKG not performed today      ASSESSMENT AND PLAN:   PAD (peripheral artery disease) (HCC) [name] was referred to me by [name] Newcomer, PA-C for evaluation of PAD.  She is a patient of Dr. Odessa Fleming.  She has had a pacemaker placed in the past.  She does complain of left greater than right lower extremity claudication for the last several months.  She does have risk factors for vascular disease as well.  She had Doppler studies performed in our office 12/02/2022 revealing a right ABI of 0.97 and left of 0.62 with a high-frequency signal in the left external iliac artery.  This is lifestyle limiting and she wishes to proceed with outpatient peripheral angiography and endovascular therapy.  [name] Gess MD FACP,FACC,FAHA, [facility] 12/11/2022 9:23 AM

## 2022-12-12 LAB — CBC
Hematocrit: 40 % (ref 34.0–46.6)
Hemoglobin: 13.4 g/dL (ref 11.1–15.9)
MCH: 31.6 pg (ref 26.6–33.0)
MCHC: 33.5 g/dL (ref 31.5–35.7)
MCV: 94 fL (ref 79–97)
Platelets: 267 10*3/uL (ref 150–450)
RBC: 4.24 x10E6/uL (ref 3.77–5.28)
RDW: 13.2 % (ref 11.7–15.4)
WBC: 11.8 10*3/uL — ABNORMAL HIGH (ref 3.4–10.8)

## 2022-12-12 LAB — BASIC METABOLIC PANEL
BUN/Creatinine Ratio: 9 — ABNORMAL LOW (ref 12–28)
BUN: 14 mg/dL (ref 8–27)
CO2: 21 mmol/L (ref 20–29)
Calcium: 10.1 mg/dL (ref 8.7–10.3)
Chloride: 103 mmol/L (ref 96–106)
Creatinine, Ser: 1.58 mg/dL — ABNORMAL HIGH (ref 0.57–1.00)
Glucose: 95 mg/dL (ref 70–99)
Potassium: 5 mmol/L (ref 3.5–5.2)
Sodium: 140 mmol/L (ref 134–144)
eGFR: 37 mL/min/{1.73_m2} — ABNORMAL LOW (ref >59)

## 2022-12-15 ENCOUNTER — Other Ambulatory Visit: Payer: Self-pay

## 2022-12-15 DIAGNOSIS — I739 Peripheral vascular disease, unspecified: Secondary | ICD-10-CM

## 2022-12-31 ENCOUNTER — Ambulatory Visit: Payer: Medicare HMO

## 2022-12-31 DIAGNOSIS — I428 Other cardiomyopathies: Secondary | ICD-10-CM | POA: Diagnosis not present

## 2023-01-05 ENCOUNTER — Telehealth: Payer: Self-pay | Admitting: *Deleted

## 2023-01-05 DIAGNOSIS — I739 Peripheral vascular disease, unspecified: Secondary | ICD-10-CM | POA: Diagnosis not present

## 2023-01-05 NOTE — Telephone Encounter (Signed)
See 12/11/22 BMP results-pt reports she is going to [employer] today for BMP.

## 2023-01-05 NOTE — Telephone Encounter (Signed)
Abdominal aortogram scheduled at [facility] for: Thursday January 07, 2023 7:30 AM Arrival time [facility] Main Entrance A at: 5:30 AM  Nothing to eat after midnight prior to procedure, clear liquids until 5 AM day of procedure.  Medication instructions: -Hold:  Farxiga-AM of procedure  Spironolactone/Valsartan-day before and day of procedure-per protocol GFR 43-already taken today -Other usual morning medications can be taken with sips of water including aspirin 81 mg.  Confirmed patient has responsible adult to drive home post procedure and be with patient first 24 hours after arriving home.  Plan to go home the same day, you will only stay overnight if medically necessary.

## 2023-01-06 NOTE — Telephone Encounter (Signed)
Patient will arrive at 5 AM for hydration prior to 7:30 AM procedure, unable to arrange later procedure time for patient, RN cath lab aware.

## 2023-01-07 ENCOUNTER — Encounter (HOSPITAL_COMMUNITY): Payer: Self-pay | Admitting: Cardiovascular Disease

## 2023-01-07 ENCOUNTER — Other Ambulatory Visit: Payer: Self-pay

## 2023-01-07 ENCOUNTER — Encounter (HOSPITAL_COMMUNITY)
Admission: RE | Disposition: A | Discharge status: Home / Self Care | Payer: Self-pay | Source: Home / Self Care | Attending: Cardiovascular Disease

## 2023-01-07 ENCOUNTER — Ambulatory Visit (HOSPITAL_COMMUNITY)
Admission: RE | Admit: 2023-01-07 | Discharge: 2023-01-08 | Disposition: A | Discharge status: Home / Self Care | Payer: Medicare HMO | Attending: Cardiovascular Disease | Admitting: Cardiovascular Disease

## 2023-01-07 DIAGNOSIS — G40909 Epilepsy, unspecified, not intractable, without status epilepticus: Secondary | ICD-10-CM | POA: Insufficient documentation

## 2023-01-07 DIAGNOSIS — F17210 Nicotine dependence, cigarettes, uncomplicated: Secondary | ICD-10-CM | POA: Insufficient documentation

## 2023-01-07 DIAGNOSIS — I442 Atrioventricular block, complete: Secondary | ICD-10-CM | POA: Diagnosis not present

## 2023-01-07 DIAGNOSIS — Z959 Presence of cardiac and vascular implant and graft, unspecified: Secondary | ICD-10-CM | POA: Diagnosis present

## 2023-01-07 DIAGNOSIS — E785 Hyperlipidemia, unspecified: Secondary | ICD-10-CM | POA: Diagnosis not present

## 2023-01-07 DIAGNOSIS — I700 Atherosclerosis of aorta: Secondary | ICD-10-CM | POA: Diagnosis not present

## 2023-01-07 DIAGNOSIS — I70212 Atherosclerosis of native arteries of extremities with intermittent claudication, left leg: Secondary | ICD-10-CM | POA: Diagnosis present

## 2023-01-07 DIAGNOSIS — I10 Essential (primary) hypertension: Secondary | ICD-10-CM | POA: Diagnosis present

## 2023-01-07 DIAGNOSIS — Z720 Tobacco use: Secondary | ICD-10-CM | POA: Diagnosis present

## 2023-01-07 DIAGNOSIS — I739 Peripheral vascular disease, unspecified: Secondary | ICD-10-CM | POA: Diagnosis present

## 2023-01-07 DIAGNOSIS — Z950 Presence of cardiac pacemaker: Secondary | ICD-10-CM | POA: Insufficient documentation

## 2023-01-07 HISTORY — PX: ABDOMINAL AORTOGRAM W/LOWER EXTREMITY: CATH118223

## 2023-01-07 LAB — POCT ACTIVATED CLOTTING TIME
Activated Clotting Time: 257 seconds
Activated Clotting Time: 293 seconds
Activated Clotting Time: 244 seconds
Activated Clotting Time: 207 seconds
Activated Clotting Time: 189 seconds
Activated Clotting Time: 165 seconds

## 2023-01-07 SURGERY — ABDOMINAL AORTOGRAM W/LOWER EXTREMITY
Anesthesia: LOCAL

## 2023-01-07 MED ORDER — SODIUM CHLORIDE 0.9% FLUSH
3.0000 mL | Freq: Two times a day (BID) | INTRAVENOUS | Status: DC
  Administered 2023-01-07 – 2023-01-08 (×2): 3 mL via INTRAVENOUS

## 2023-01-07 MED ORDER — ACETAMINOPHEN 325 MG PO TABS
650.0000 mg | ORAL_TABLET | ORAL | Status: DC | PRN
  Administered 2023-01-07 – 2023-01-08 (×3): 650 mg via ORAL
  Filled 2023-01-07 (×2): qty 2

## 2023-01-07 MED ORDER — LIDOCAINE HCL (PF) 1 % IJ SOLN
INTRAMUSCULAR | Status: DC | PRN
  Administered 2023-01-07: 20 mL via INTRADERMAL

## 2023-01-07 MED ORDER — ACETAMINOPHEN 325 MG PO TABS
ORAL_TABLET | ORAL | Status: AC
  Filled 2023-01-07: qty 2

## 2023-01-07 MED ORDER — MONTELUKAST SODIUM 10 MG PO TABS
10.0000 mg | ORAL_TABLET | Freq: Every day | ORAL | Status: DC
  Administered 2023-01-07: 10 mg via ORAL
  Filled 2023-01-07: qty 1

## 2023-01-07 MED ORDER — HYDRALAZINE HCL 20 MG/ML IJ SOLN
INTRAMUSCULAR | Status: AC
  Filled 2023-01-07: qty 1

## 2023-01-07 MED ORDER — CLOPIDOGREL BISULFATE 75 MG PO TABS
75.0000 mg | ORAL_TABLET | Freq: Every day | ORAL | Status: DC
  Administered 2023-01-08: 75 mg via ORAL
  Filled 2023-01-07: qty 1

## 2023-01-07 MED ORDER — HEPARIN SODIUM (PORCINE) 1000 UNIT/ML IJ SOLN
INTRAMUSCULAR | Status: AC
  Filled 2023-01-07: qty 10

## 2023-01-07 MED ORDER — SODIUM CHLORIDE 0.9 % IV SOLN: 250.0000 mL | INTRAVENOUS | Status: DC | PRN

## 2023-01-07 MED ORDER — BUSPIRONE HCL 10 MG PO TABS
10.0000 mg | ORAL_TABLET | Freq: Two times a day (BID) | ORAL | Status: DC
  Administered 2023-01-07 – 2023-01-08 (×2): 10 mg via ORAL
  Filled 2023-01-07 (×2): qty 1
  Filled 2023-01-07: qty 2
  Filled 2023-01-07: qty 1
  Filled 2023-01-07: qty 2

## 2023-01-07 MED ORDER — SODIUM CHLORIDE 0.9% FLUSH: 3.0000 mL | Freq: Two times a day (BID) | INTRAVENOUS | Status: DC

## 2023-01-07 MED ORDER — ASPIRIN 81 MG PO TBEC
81.0000 mg | DELAYED_RELEASE_TABLET | Freq: Every day | ORAL | Status: DC
  Administered 2023-01-08: 81 mg via ORAL
  Filled 2023-01-07: qty 1

## 2023-01-07 MED ORDER — SODIUM CHLORIDE 0.9 % WEIGHT BASED INFUSION
1.0000 mL/kg/h | INTRAVENOUS | Status: DC
  Administered 2023-01-07: 1 mL/kg/h via INTRAVENOUS

## 2023-01-07 MED ORDER — HEPARIN (PORCINE) IN NACL 1000-0.9 UT/500ML-% IV SOLN
INTRAVENOUS | Status: DC | PRN
  Administered 2023-01-07: 500 mL

## 2023-01-07 MED ORDER — PANTOPRAZOLE SODIUM 40 MG PO TBEC
40.0000 mg | DELAYED_RELEASE_TABLET | Freq: Every day | ORAL | Status: DC
  Administered 2023-01-08: 40 mg via ORAL
  Filled 2023-01-07: qty 1

## 2023-01-07 MED ORDER — HYDRALAZINE HCL 20 MG/ML IJ SOLN
5.0000 mg | INTRAMUSCULAR | Status: DC | PRN
  Administered 2023-01-07: 5 mg via INTRAVENOUS

## 2023-01-07 MED ORDER — ASPIRIN 81 MG PO CHEW: 81.0000 mg | CHEWABLE_TABLET | ORAL | Status: DC

## 2023-01-07 MED ORDER — SODIUM CHLORIDE 0.9% FLUSH: 3.0000 mL | INTRAVENOUS | Status: DC | PRN

## 2023-01-07 MED ORDER — SODIUM CHLORIDE 0.9 % WEIGHT BASED INFUSION
3.0000 mL/kg/h | INTRAVENOUS | Status: DC
  Administered 2023-01-07: 3 mL/kg/h via INTRAVENOUS

## 2023-01-07 MED ORDER — ALBUTEROL SULFATE (2.5 MG/3ML) 0.083% IN NEBU: 2.5000 mg | INHALATION_SOLUTION | Freq: Four times a day (QID) | RESPIRATORY_TRACT | Status: DC | PRN

## 2023-01-07 MED ORDER — LAMOTRIGINE 100 MG PO TABS
50.0000 mg | ORAL_TABLET | Freq: Every day | ORAL | Status: DC
  Administered 2023-01-07: 50 mg via ORAL
  Filled 2023-01-07: qty 1

## 2023-01-07 MED ORDER — MIDAZOLAM HCL 2 MG/2ML IJ SOLN
INTRAMUSCULAR | Status: AC
  Filled 2023-01-07: qty 2

## 2023-01-07 MED ORDER — OXCARBAZEPINE 300 MG PO TABS
300.0000 mg | ORAL_TABLET | Freq: Two times a day (BID) | ORAL | Status: DC
  Administered 2023-01-07 – 2023-01-08 (×2): 300 mg via ORAL
  Filled 2023-01-07: qty 2
  Filled 2023-01-07 (×3): qty 1
  Filled 2023-01-07: qty 2

## 2023-01-07 MED ORDER — CARVEDILOL 3.125 MG PO TABS
3.1250 mg | ORAL_TABLET | Freq: Two times a day (BID) | ORAL | Status: DC
  Administered 2023-01-07 – 2023-01-08 (×2): 3.125 mg via ORAL
  Filled 2023-01-07 (×2): qty 1

## 2023-01-07 MED ORDER — SODIUM CHLORIDE 0.9 % IV SOLN: INTRAVENOUS | Status: AC

## 2023-01-07 MED ORDER — MIDAZOLAM HCL 2 MG/2ML IJ SOLN
INTRAMUSCULAR | Status: DC | PRN
  Administered 2023-01-07: 1 mg via INTRAVENOUS

## 2023-01-07 MED ORDER — ONDANSETRON HCL 4 MG/2ML IJ SOLN: 4.0000 mg | Freq: Four times a day (QID) | INTRAMUSCULAR | Status: DC | PRN

## 2023-01-07 MED ORDER — FENTANYL CITRATE (PF) 100 MCG/2ML IJ SOLN
INTRAMUSCULAR | Status: DC | PRN
  Administered 2023-01-07 (×2): 25 ug via INTRAVENOUS

## 2023-01-07 MED ORDER — NITROGLYCERIN 1 MG/10 ML FOR IR/CATH LAB
INTRA_ARTERIAL | Status: DC | PRN
  Administered 2023-01-07: 200 ug via INTRA_ARTERIAL

## 2023-01-07 MED ORDER — CLOPIDOGREL BISULFATE 300 MG PO TABS
ORAL_TABLET | ORAL | Status: DC | PRN
  Administered 2023-01-07: 300 mg via ORAL

## 2023-01-07 MED ORDER — LIDOCAINE HCL (PF) 1 % IJ SOLN
INTRAMUSCULAR | Status: AC
  Filled 2023-01-07: qty 30

## 2023-01-07 MED ORDER — IRBESARTAN 150 MG PO TABS
150.0000 mg | ORAL_TABLET | Freq: Every day | ORAL | Status: DC
  Administered 2023-01-07 – 2023-01-08 (×2): 150 mg via ORAL
  Filled 2023-01-07 (×2): qty 1

## 2023-01-07 MED ORDER — FLUTICASONE PROPIONATE 50 MCG/ACT NA SUSP: 1.0000 | Freq: Every day | NASAL | Status: DC | PRN

## 2023-01-07 MED ORDER — LABETALOL HCL 5 MG/ML IV SOLN: 10.0000 mg | INTRAVENOUS | Status: DC | PRN

## 2023-01-07 MED ORDER — DAPAGLIFLOZIN PROPANEDIOL 10 MG PO TABS
10.0000 mg | ORAL_TABLET | Freq: Every day | ORAL | Status: DC
  Administered 2023-01-08: 10 mg via ORAL
  Filled 2023-01-07: qty 1

## 2023-01-07 MED ORDER — LAMOTRIGINE 100 MG PO TABS
200.0000 mg | ORAL_TABLET | Freq: Two times a day (BID) | ORAL | Status: DC
  Administered 2023-01-07 – 2023-01-08 (×2): 200 mg via ORAL
  Filled 2023-01-07 (×2): qty 2

## 2023-01-07 MED ORDER — FENTANYL CITRATE (PF) 100 MCG/2ML IJ SOLN
INTRAMUSCULAR | Status: AC
  Filled 2023-01-07: qty 2

## 2023-01-07 MED ORDER — SPIRONOLACTONE 12.5 MG HALF TABLET
12.5000 mg | ORAL_TABLET | Freq: Every day | ORAL | Status: DC
  Administered 2023-01-07 – 2023-01-08 (×2): 12.5 mg via ORAL
  Filled 2023-01-07 (×2): qty 1

## 2023-01-07 MED ORDER — ATORVASTATIN CALCIUM 10 MG PO TABS
20.0000 mg | ORAL_TABLET | Freq: Every day | ORAL | Status: DC
  Administered 2023-01-07 – 2023-01-08 (×2): 20 mg via ORAL
  Filled 2023-01-07 (×2): qty 2

## 2023-01-07 MED ORDER — NITROGLYCERIN 1 MG/10 ML FOR IR/CATH LAB
INTRA_ARTERIAL | Status: AC
  Filled 2023-01-07: qty 10

## 2023-01-07 MED ORDER — CLOPIDOGREL BISULFATE 300 MG PO TABS
ORAL_TABLET | ORAL | Status: AC
  Filled 2023-01-07: qty 1

## 2023-01-07 MED ORDER — ASPIRIN 81 MG PO CHEW
81.0000 mg | CHEWABLE_TABLET | ORAL | Status: AC
  Administered 2023-01-07: 81 mg via ORAL

## 2023-01-07 MED ORDER — AMLODIPINE BESYLATE 5 MG PO TABS
5.0000 mg | ORAL_TABLET | Freq: Every day | ORAL | Status: DC
  Administered 2023-01-08: 5 mg via ORAL
  Filled 2023-01-07: qty 1

## 2023-01-07 MED ORDER — HEPARIN SODIUM (PORCINE) 1000 UNIT/ML IJ SOLN
INTRAMUSCULAR | Status: DC | PRN
  Administered 2023-01-07: 9000 [IU] via INTRAVENOUS
  Administered 2023-01-07: 1500 [IU] via INTRAVENOUS

## 2023-01-07 SURGICAL SUPPLY — 21 items
BAG SNAP BAND KOVER 36X36 (MISCELLANEOUS) ×1
BALLN MUSTANG 6.0X20 75 (BALLOONS) ×1
BALLN MUSTANG 7.0X20 75 (BALLOONS) ×1
CATH ANGIO 5F PIGTAIL 65CM (CATHETERS) ×1
CATH STRAIGHT 5FR 65CM (CATHETERS) ×1
COVER DOME SNAP 22 D (MISCELLANEOUS) ×1
KIT ENCORE 26 ADVANTAGE (KITS) ×1
KIT PV (KITS) ×1
KIT SYRINGE INJ CVI SPIKEX1 (MISCELLANEOUS) ×1
SET ATX-X65L (MISCELLANEOUS) ×1
SHEATH BRITE TIP 6FR 35CM (SHEATH) ×1
SHEATH PINNACLE 5F 10CM (SHEATH) ×1
SHEATH PINNACLE 6F 10CM (SHEATH) ×1
SHEATH PROBE COVER 6X72 (BAG) ×1
STENT ABSOLUTE PRO 8X40X135 (Permanent Stent) ×1 IMPLANT
SYR MEDRAD MARK 7 150ML (SYRINGE) ×1
TAPE SHOOT N SEE (TAPE) ×1
TRANSDUCER W/STOPCOCK (MISCELLANEOUS) ×1
TRAY PV CATH (CUSTOM PROCEDURE TRAY) ×1
WIRE HITORQ VERSACORE ST 145CM (WIRE) ×1
WIRE VERSACORE LOC 115CM (WIRE) ×1

## 2023-01-07 NOTE — Interval H&P Note (Signed)
History and Physical Interval Note:  01/07/2023 7:36 AM  [name] [name]  has presented today for surgery, with the diagnosis of pad.  The various methods of treatment have been discussed with the patient and family. After consideration of risks, benefits and other options for treatment, the patient has consented to  Procedure(s): ABDOMINAL AORTOGRAM W/LOWER EXTREMITY (N/A) as a surgical intervention.  The patient's history has been reviewed, patient examined, no change in status, stable for surgery.  I have reviewed the patient's chart and labs.  Questions were answered to the patient's satisfaction.     Nanetta Batty

## 2023-01-07 NOTE — Progress Notes (Signed)
25fr sheath aspirated and removed from left femoral artery, manual pressure applied for 30 minutes. Site level 0, no S&S of hematoma. Tegaderm dressing applied, bedrest instructions given.  Bilateral dp and pt pulses present with doppler, right dp faint but present.   Bedrest begins at 13:50:00

## 2023-01-07 NOTE — Progress Notes (Signed)
Pt arrived to 6E rm 5, VSS, Oriented to room, call light within reach, orders checked.   Balinda Quails, RN 01/07/2023 3:22 PM

## 2023-01-08 ENCOUNTER — Encounter (HOSPITAL_COMMUNITY): Payer: Self-pay | Admitting: Cardiovascular Disease

## 2023-01-08 DIAGNOSIS — Z950 Presence of cardiac pacemaker: Secondary | ICD-10-CM | POA: Diagnosis not present

## 2023-01-08 DIAGNOSIS — I70212 Atherosclerosis of native arteries of extremities with intermittent claudication, left leg: Secondary | ICD-10-CM | POA: Diagnosis not present

## 2023-01-08 DIAGNOSIS — I442 Atrioventricular block, complete: Secondary | ICD-10-CM | POA: Diagnosis not present

## 2023-01-08 DIAGNOSIS — I739 Peripheral vascular disease, unspecified: Secondary | ICD-10-CM | POA: Diagnosis not present

## 2023-01-08 DIAGNOSIS — G40909 Epilepsy, unspecified, not intractable, without status epilepticus: Secondary | ICD-10-CM | POA: Diagnosis not present

## 2023-01-08 DIAGNOSIS — E785 Hyperlipidemia, unspecified: Secondary | ICD-10-CM | POA: Diagnosis not present

## 2023-01-08 DIAGNOSIS — I10 Essential (primary) hypertension: Secondary | ICD-10-CM | POA: Diagnosis not present

## 2023-01-08 DIAGNOSIS — I700 Atherosclerosis of aorta: Secondary | ICD-10-CM | POA: Diagnosis not present

## 2023-01-08 DIAGNOSIS — F17210 Nicotine dependence, cigarettes, uncomplicated: Secondary | ICD-10-CM | POA: Diagnosis not present

## 2023-01-08 LAB — LIPID PANEL
Cholesterol: 140 mg/dL (ref 0–200)
HDL: 36 mg/dL — ABNORMAL LOW (ref >40)
LDL Cholesterol: 68 mg/dL (ref 0–99)
Total CHOL/HDL Ratio: 3.9 RATIO
Triglycerides: 180 mg/dL — ABNORMAL HIGH (ref <150)
VLDL: 36 mg/dL (ref 0–40)

## 2023-01-08 MED ORDER — ASPIRIN 81 MG PO TBEC: 81.0000 mg | DELAYED_RELEASE_TABLET | Freq: Every day | ORAL | Status: AC

## 2023-01-08 MED ORDER — CLOPIDOGREL BISULFATE 75 MG PO TABS: 75.0000 mg | ORAL_TABLET | Freq: Every day | ORAL | 1 refills | Status: DC

## 2023-01-08 NOTE — Plan of Care (Signed)
  Problem: Education: Goal: Understanding of CV disease, CV risk reduction, and recovery process will improve Outcome: Progressing Goal: Individualized Educational Video(s) Outcome: Progressing   Problem: Cardiovascular: Goal: Ability to achieve and maintain adequate cardiovascular perfusion will improve Outcome: Progressing Goal: Vascular access site(s) Level 0-1 will be maintained Outcome: Progressing   Problem: Activity: Goal: Ability to return to baseline activity level will improve Outcome: Progressing   Problem: Education: Goal: Knowledge of General Education information will improve Description: Including pain rating scale, medication(s)/side effects and non-pharmacologic comfort measures Outcome: Progressing   Problem: Health Behavior/Discharge Planning: Goal: Ability to safely manage health-related needs after discharge will improve Outcome: Progressing

## 2023-01-08 NOTE — Progress Notes (Addendum)
Rounding Note    Patient Name: [name] [name] Date of Encounter: 01/08/2023  [facility] Cardiologist: None   Subjective   Denies any CP or SOB.   Inpatient Medications    Scheduled Meds:  amLODipine  5 mg Oral Daily   aspirin EC  81 mg Oral Daily   atorvastatin  20 mg Oral Daily   busPIRone  10 mg Oral BID   carvedilol  3.125 mg Oral BID WC   clopidogrel  75 mg Oral Q breakfast   dapagliflozin propanediol  10 mg Oral QAC breakfast   irbesartan  150 mg Oral Daily   lamoTRIgine  200 mg Oral BID   lamoTRIgine  50 mg Oral QHS   montelukast  10 mg Oral QHS   Oxcarbazepine  300 mg Oral BID   pantoprazole  40 mg Oral Daily   sodium chloride flush  3 mL Intravenous Q12H   spironolactone  12.5 mg Oral Daily   Continuous Infusions:  sodium chloride     PRN Meds: sodium chloride, acetaminophen, albuterol, fluticasone, hydrALAZINE, labetalol, ondansetron (ZOFRAN) IV, sodium chloride flush   Vital Signs    Vitals:   01/07/23 2024 01/07/23 2314 01/08/23 0503 01/08/23 0808  BP: (!) 147/64 (!) 166/58 (!) 155/67 (!) 152/67  Pulse:  64  65  Resp: 18 20 20 18   Temp: 97.8 F (36.6 C) 98.2 F (36.8 C) 98.4 F (36.9 C) 98 F (36.7 C)  TempSrc: Oral Oral Oral Oral  SpO2:  98%  98%  Weight:      Height:        Intake/Output Summary (Last 24 hours) at 01/08/2023 1002 Last data filed at 01/08/2023 0930 Gross per 24 hour  Intake 1623.19 ml  Output 800 ml  Net 823.19 ml      01/07/2023    3:12 PM 01/07/2023    6:11 AM 01/07/2023    5:00 AM  Last 3 Weights  Weight (lbs) 188 lb 11.4 oz 186 lb 188 lb  Weight (kg) 85.6 kg 84.369 kg 85.276 kg      Telemetry    Paced rhythm- Personally Reviewed  ECG    Paced rhythm - Personally Reviewed  Physical Exam   GEN: No acute distress.   Neck: No JVD Cardiac: RRR, no murmurs, rubs, or gallops.  Respiratory: diffusely diminished breath sound GI: Soft, nontender, non-distended  MS: No edema; No deformity. Neuro:   Nonfocal  Psych: Normal affect   Labs    High Sensitivity Troponin:  No results for input(s): "TROPONINIHS" in the last 720 hours.   Chemistry Recent Labs  Lab 01/05/23 1506 01/08/23 0031  NA 140 139  K 3.8 4.0  CL 104 106  CO2 20 23  GLUCOSE 117* 125*  BUN 9 9  CREATININE 1.37* 1.42*  CALCIUM 9.1 9.0  GFRNONAA  --  42*  ANIONGAP  --  10    Lipids  Recent Labs  Lab 01/08/23 0031  CHOL 140  TRIG 180*  HDL 36*  LDLCALC 68  CHOLHDL 3.9    Hematology Recent Labs  Lab 01/08/23 0031  WBC 9.7  RBC 3.94  HGB 12.3  HCT 38.1  MCV 96.7  MCH 31.2  MCHC 32.3  RDW 14.7  PLT 271   Thyroid No results for input(s): "TSH", "FREET4" in the last 168 hours.  BNPNo results for input(s): "BNP", "PROBNP" in the last 168 hours.  DDimer No results for input(s): "DDIMER" in the last 168 hours.  Radiology    PERIPHERAL VASCULAR CATHETERIZATION  Result Date: 01/07/2023 Images from the original result were not included.  [id number] LOCATION:  FACILITY: [facility] PHYSICIAN: Nanetta Batty, M.D. June 20, 1960 DATE OF PROCEDURE:  01/07/2023 DATE OF DISCHARGE: PV Angiogram/Intervention History obtained from chart review.[name] [name] is a 62 y.o. moderately overweight divorced Caucasian female with no children who is accompanied by her twin Sister [name] [name] today.  She was referred by Tereso Newcomer, PA-C-see for evaluation of lifestyle-limiting claudication.  She is disabled because of epilepsy.  Her risk factors include 25 pack years of tobacco abuse smoking 1/2 pack/day recalcitrant to risk factor modification.  History of hypertension and hyperlipidemia.  One of her sisters has had coronary intervention with stents.  She is never had a heart attack or stroke.  She denies chest pain or shortness of breath.  She has noticed left greater than right lower extremity claudication for the last several months with recent Doppler studies performed 12/02/2022 revealing a decreased left ABI of 0.62 with a  high-frequency signal in her left extrailiac artery.  The patient presents today for outpatient peripheral angiography and potential endovascular therapy. Pre Procedure Diagnosis: Peripheral arterial disease Post Procedure Diagnosis: Peripheral arterial disease Operators: Dr. Nanetta Batty Procedures Performed:  1.  Ultrasound guided left common femoral access  2.  Abdominal aortogram/bilateral iliac angiogram  3.  Pullback gradient across the left extrailiac artery after administration of intra-arterial nitroglycerin  4.  PTA and stenting left extrailiac artery PROCEDURE DESCRIPTION: The patient was brought to the second floor [facility] cath lab in the the postabsorptive state. She was premedicated with IV Versed and fentanyl. Her left groin was prepped and shaved in usual sterile fashion. Xylocaine 1% was used for local anesthesia. A 6 French sheath was inserted into the left common femoral artery using standard Seldinger technique.  Ultrasound was used to identify the left femoral artery and guide access.  A digital image was captured and placed in patient's chart.  The patient received 4000 units  of heparin intravenously.  A 5 French pigtail catheter was placed in the distal abdominal aorta.  Distal abdominal aortography, and bilateral iliac angiography were performed using subtraction angiography.  Omnipaque dye was used for the entirety of the case (50 cc of contrast total to patient.  Retrograde ordered pressures monitored to the case.  A 5 French straight endhole catheter was used to perform a pullback gradient across the ostial left extrailiac artery after administration of 200 mcg of intra-arterial nitroglycerin demonstrating a 16mm pullback gradient.  Angiographic Data: 1: Abdominal aorta-30 to 40% fairly focal distal abdominal aortic stenosis just above the iliac bifurcation 2: Left lower extremity-60 to 70% fairly focal ostial left extrailiac artery stenosis with a 60 mmHg pullback gradient  3: Right lower extremity-iliac arteries are widely patent   Ms. Brase has a hemodynamically significant ostial left external iliac artery stenosis with a 60 mm gradient which corresponds to her duplex abnormality and is consistent with her symptoms.  Given the location at the origin of the hypogastric artery we will proceed with PTA and stenting using a nitinol self-expanding stent to preserve the hypogastric. Procedure Description: The existing 6 French sheath was exchanged over a 035 versa core wire for a 6 [place] Brite tip sheath.  Patient received a total of 10,500 units of heparin with an ending ACT of 293.  Omnipaque dye was used for the entirety of the intervention.  The lesion was predilated with a 6 mm x 2 cm  balloon.  Following this a 8 mm x 4 cm Abbott absolute Pro nitinol self-expanding stent was then carefully positioned and deployed across the diseased segment and postdilated with a 7 mm x 2 cm balloon resulting reduction of a 60 to 70% focal ostial left extrailiac artery stenosis to 0% residual.  The patient tolerated the procedure well.  The right tip sheath was then exchanged over a wire for a short 6 [place] sheath.  The femoral angiogram was performed in the LAO view which suggested the entry was mildly high precluding the use of a closure device.  Manual hold to be recommended.  The patient received 300 mg of clopidogrel p.o. Final Impression: Successful PTA and stenting using a nitinol self-expanding stent for a hemodynamically significant ostial left extrailiac artery stenosis in the setting of left lower extremity lifestyle-limiting claudication.  The sheath will be removed once ACT falls below 170 and pressure held.  Patient will be treated with DAPT.  She will be hydrated overnight and discharged home in the morning.  Will get lower extremity arterial Doppler studies in unrefined office in 1 to 2 weeks and I will see her back thereafter as an outpatient.  She left the lab in stable  condition. Nanetta Batty. MD, [facility] 01/07/2023 9:12 AM     Cardiac Studies   LE angiography 01/07/2023 Angiographic Data:    1: Abdominal aorta-30 to 40% fairly focal distal abdominal aortic stenosis just above the iliac bifurcation 2: Left lower extremity-60 to 70% fairly focal ostial left extrailiac artery stenosis with a 60 mmHg pullback gradient 3: Right lower extremity-iliac arteries are widely patent   IMPRESSION: Ms. Mault has a hemodynamically significant ostial left external iliac artery stenosis with a 60 mm gradient which corresponds to her duplex abnormality and is consistent with her symptoms.  Given the location at the origin of the hypogastric artery we will proceed with PTA and stenting using a nitinol self-expanding stent to preserve the hypogastric.   Procedure Description: The existing 6 French sheath was exchanged over a 035 versa core wire for a 6 [place] Brite tip sheath.  Patient received a total of 10,500 units of heparin with an ending ACT of 293.  Omnipaque dye was used for the entirety of the intervention.  The lesion was predilated with a 6 mm x 2 cm balloon.  Following this a 8 mm x 4 cm Abbott absolute Pro nitinol self-expanding stent was then carefully positioned and deployed across the diseased segment and postdilated with a 7 mm x 2 cm balloon resulting reduction of a 60 to 70% focal ostial left extrailiac artery stenosis to 0% residual.  The patient tolerated the procedure well.  The right tip sheath was then exchanged over a wire for a short 6 [place] sheath.  The femoral angiogram was performed in the LAO view which suggested the entry was mildly high precluding the use of a closure device.  Manual hold to be recommended.  The patient received 300 mg of clopidogrel p.o.   Final Impression: Successful PTA and stenting using a nitinol self-expanding stent for a hemodynamically significant ostial left extrailiac artery stenosis in the setting of left lower extremity  lifestyle-limiting claudication.  The sheath will be removed once ACT falls below 170 and pressure held.  Patient will be treated with DAPT.  She will be hydrated overnight and discharged home in the morning.  Will get lower extremity arterial Doppler studies in unrefined office in 1 to 2 weeks and I will see  her back thereafter as an outpatient.  She left the lab in stable condition.  Patient Profile     62 y.o. female with PMH of tobacco abuse, HTN, HLD, CHB s/p Medtronic PPM 2016, epilepsy and PAD who underwent LE angiography due to claudication symptom.   Assessment & Plan    PAD  - LE angio 8/1 showed 30-40% distal abdominal aortic stenosis, 60-70% focal ostial L external iliac artery stenosis treated with balloon and 8mm x 4cm Abbott self-expanding stent, widely patent R iliac arteries.   - doing well. On ASA and plavix. LE arterial doppler in 1-2 weeks. Discharge today. Follow up with Dr. Allyson Sabal on 8/21 as scheduled  CHB s/p Medtronic PPM Hypertension Hyperlipidemia Tobacco abuse: emphasized on the importance of tobacco cessation.    For questions or updates, please contact [facility] Please consult [email or web address] for contact info under        Signed, Azalee Course, PA  01/08/2023, 10:02 AM    ATTENDING ATTESTATION:  After conducting a review of all available clinical information with the care team, interviewing the patient, and performing a physical exam, I agree with the findings and plan described in this note.   GEN: No acute distress.   HEENT:  MMM, no JVD, no scleral icterus Cardiac: RRR, no murmurs, rubs, or gallops.  Respiratory: Clear to auscultation bilaterally. GI: Soft, nontender, non-distended  MS: No edema; No deformity. Neuro:  Nonfocal  Vasc:  +2 radial pulses, left femoral site CDI  Patient doing well after uncomplicated intervention on left external iliac artery.  Access sites are stable and the patient has had no evidence of bleeding.  Will  discharge today with close follow-up.  Alverda Skeans, MD Pager [phone]

## 2023-01-08 NOTE — Discharge Summary (Addendum)
Discharge Summary    Patient ID: [name] [name] MRN: [id number]; DOB: [birth year]  Admit date: 01/07/2023 Discharge date: 01/08/2023  PCP:  Carin Hock, PA   [facility] Providers Cardiologist:  None  Cardiology APP:  Beatrice Lecher, PA-C  Electrophysiologist:  Sherryl Manges, MD  Electrophysiology APP:  Wylie Hail  PV specialist: Dr. Allyson Sabal   Discharge Diagnoses    Principal Problem:   Claudication in peripheral vascular disease [facility]) Active Problems:   Tobacco abuse   Complete heart block [facility])   Cardiac device in situ   Essential hypertension   PAD (peripheral artery disease) (HCC)    Diagnostic Studies/Procedures    LE angiography 01/07/2023 Pre Procedure Diagnosis: Peripheral arterial disease   Post Procedure Diagnosis: Peripheral arterial disease   Operators: Dr. Nanetta Batty   Procedures Performed:             1.  Ultrasound guided left common femoral access             2.  Abdominal aortogram/bilateral iliac angiogram             3.  Pullback gradient across the left extrailiac artery after administration of intra-arterial nitroglycerin             4.  PTA and stenting left extrailiac artery   PROCEDURE DESCRIPTION:    The patient was brought to the second floor [facility] cath lab in the the postabsorptive state. She was premedicated with IV Versed and fentanyl. Her left groin was prepped and shaved in usual sterile fashion. Xylocaine 1% was used for local anesthesia. A 6 French sheath was inserted into the left common femoral artery using standard Seldinger technique.  Ultrasound was used to identify the left femoral artery and guide access.  A digital image was captured and placed in patient's chart.  The patient received 4000 units  of heparin intravenously.  A 5 French pigtail catheter was placed in the distal abdominal aorta.  Distal abdominal aortography, and bilateral iliac angiography were performed using subtraction  angiography.  Omnipaque dye was used for the entirety of the case (50 cc of contrast total to patient.  Retrograde ordered pressures monitored to the case.  A 5 French straight endhole catheter was used to perform a pullback gradient across the ostial left extrailiac artery after administration of 200 mcg of intra-arterial nitroglycerin demonstrating a 16mm pullback gradient.     Angiographic Data:    1: Abdominal aorta-30 to 40% fairly focal distal abdominal aortic stenosis just above the iliac bifurcation 2: Left lower extremity-60 to 70% fairly focal ostial left extrailiac artery stenosis with a 60 mmHg pullback gradient 3: Right lower extremity-iliac arteries are widely patent   IMPRESSION: [name] [name] has a hemodynamically significant ostial left external iliac artery stenosis with a 60 mm gradient which corresponds to her duplex abnormality and is consistent with her symptoms.  Given the location at the origin of the hypogastric artery we will proceed with PTA and stenting using a nitinol self-expanding stent to preserve the hypogastric.   Procedure Description: The existing 6 French sheath was exchanged over a 035 versa core wire for a 6 [place] Brite tip sheath.  Patient received a total of 10,500 units of heparin with an ending ACT of 293.  Omnipaque dye was used for the entirety of the intervention.  The lesion was predilated with a 6 mm x 2 cm balloon.  Following this a 8 mm x 4 cm  Abbott absolute Pro nitinol self-expanding stent was then carefully positioned and deployed across the diseased segment and postdilated with a 7 mm x 2 cm balloon resulting reduction of a 60 to 70% focal ostial left extrailiac artery stenosis to 0% residual.  The patient tolerated the procedure well.  The right tip sheath was then exchanged over a wire for a short 6 [place] sheath.  The femoral angiogram was performed in the LAO view which suggested the entry was mildly high precluding the use of a closure device.   Manual hold to be recommended.  The patient received 300 mg of clopidogrel p.o.   Final Impression: Successful PTA and stenting using a nitinol self-expanding stent for a hemodynamically significant ostial left extrailiac artery stenosis in the setting of left lower extremity lifestyle-limiting claudication.  The sheath will be removed once ACT falls below 170 and pressure held.  Patient will be treated with DAPT.  She will be hydrated overnight and discharged home in the morning.  Will get lower extremity arterial Doppler studies in unrefined office in 1 to 2 weeks and I will see her back thereafter as an outpatient.  She left the lab in stable condition. _____________   History of Present Illness     [name] [name] is a 62 y.o. female with PAD. She was referred by Tereso Newcomer, PA-C-see for evaluation of lifestyle-limiting claudication.  She is disabled because of epilepsy.  Her risk factors include 25 pack years of tobacco abuse smoking 1/2 pack/day recalcitrant to risk factor modification.  History of hypertension and hyperlipidemia.  One of her sisters has had coronary intervention with stents.  She is never had a heart attack or stroke.  She denies chest pain or shortness of breath.  She has noticed left greater than right lower extremity claudication for the last several months with recent Doppler studies performed 12/02/2022 revealing a decreased left ABI of 0.62 with a high-frequency signal in her left extrailiac artery.    Hospital Course     Consultants: N/A   Patient presented to the hospital on 01/07/2023 to undergo lower extremity angiography.  This revealed 30-40% distal abdominal aortic stenosis, 60-70% focal ostial L external iliac artery stenosis treated with balloon and 8mm x 4cm Abbott self-expanding stent, widely patent R iliac arteries.  Postprocedure, patient was placed on aspirin and Plavix.  Lower extremity arterial Doppler was arranged in 1 to 2 weeks.  Patient was seen in the  morning of 01/08/2023 at which time she was doing well and ambulating without any issue.  Her left femoral cath site appears to be clean, dry, without any obvious bleeding.  She is deemed stable for discharge from the cardiac perspective.  She has follow-up already scheduled to see Dr. Allyson Sabal.  Emphasis has been placed on compliance with dual antiplatelet therapy and tobacco cessation.  I confirmed with the patient, she does not have true aspirin allergy      Did the patient have an acute coronary syndrome (MI, NSTEMI, STEMI, etc) this admission?:  No                               Did the patient have a percutaneous coronary intervention (stent / angioplasty)?:  No.          _____________  Discharge Vitals Blood pressure (!) 152/67, pulse 65, temperature 98 F (36.7 C), temperature source Oral, resp. rate 18, height 5' 1.5" (1.562 m), weight 85.6  kg, SpO2 98%.  Filed Weights   01/07/23 0500 01/07/23 0611 01/07/23 1512  Weight: 85.3 kg 84.4 kg 85.6 kg    Labs & Radiologic Studies    CBC Recent Labs    01/08/23 0031  WBC 9.7  HGB 12.3  HCT 38.1  MCV 96.7  PLT 271   Basic Metabolic Panel Recent Labs    60/45/40 1506 01/08/23 0031  NA 140 139  K 3.8 4.0  CL 104 106  CO2 20 23  GLUCOSE 117* 125*  BUN 9 9  CREATININE 1.37* 1.42*  CALCIUM 9.1 9.0   Liver Function Tests No results for input(s): "AST", "ALT", "ALKPHOS", "BILITOT", "PROT", "ALBUMIN" in the last 72 hours. No results for input(s): "LIPASE", "AMYLASE" in the last 72 hours. High Sensitivity Troponin:   No results for input(s): "TROPONINIHS" in the last 720 hours.  BNP Invalid input(s): "POCBNP" D-Dimer No results for input(s): "DDIMER" in the last 72 hours. Hemoglobin A1C No results for input(s): "HGBA1C" in the last 72 hours. Fasting Lipid Panel Recent Labs    01/08/23 0031  CHOL 140  HDL 36*  LDLCALC 68  TRIG 981*  CHOLHDL 3.9   Thyroid Function Tests No results for input(s): "TSH", "T4TOTAL",  "T3FREE", "THYROIDAB" in the last 72 hours.  Invalid input(s): "FREET3" _____________  PERIPHERAL VASCULAR CATHETERIZATION  Result Date: 01/07/2023 Images from the original result were not included.  [id number] LOCATION:  FACILITY: [facility] PHYSICIAN: Nanetta Batty, M.D. [birth year] DATE OF PROCEDURE:  01/07/2023 DATE OF DISCHARGE: PV Angiogram/Intervention History obtained from chart review.[name] [name] is a 62 y.o. moderately overweight divorced Caucasian female with no children who is accompanied by her twin Sister [name] today.  She was referred by Tereso Newcomer, PA-C-see for evaluation of lifestyle-limiting claudication.  She is disabled because of epilepsy.  Her risk factors include 25 pack years of tobacco abuse smoking 1/2 pack/day recalcitrant to risk factor modification.  History of hypertension and hyperlipidemia.  One of her sisters has had coronary intervention with stents.  She is never had a heart attack or stroke.  She denies chest pain or shortness of breath.  She has noticed left greater than right lower extremity claudication for the last several months with recent Doppler studies performed 12/02/2022 revealing a decreased left ABI of 0.62 with a high-frequency signal in her left extrailiac artery.  The patient presents today for outpatient peripheral angiography and potential endovascular therapy. Pre Procedure Diagnosis: Peripheral arterial disease Post Procedure Diagnosis: Peripheral arterial disease Operators: Dr. Nanetta Batty Procedures Performed:  1.  Ultrasound guided left common femoral access  2.  Abdominal aortogram/bilateral iliac angiogram  3.  Pullback gradient across the left extrailiac artery after administration of intra-arterial nitroglycerin  4.  PTA and stenting left extrailiac artery PROCEDURE DESCRIPTION: The patient was brought to the second floor [place] [facility] cath lab in the the postabsorptive state. She was premedicated with IV Versed and fentanyl. Her left groin  was prepped and shaved in usual sterile fashion. Xylocaine 1% was used for local anesthesia. A 6 French sheath was inserted into the left common femoral artery using standard Seldinger technique.  Ultrasound was used to identify the left femoral artery and guide access.  A digital image was captured and placed in patient's chart.  The patient received 4000 units  of heparin intravenously.  A 5 French pigtail catheter was placed in the distal abdominal aorta.  Distal abdominal aortography, and bilateral iliac angiography were performed using subtraction angiography.  Omnipaque dye was  used for the entirety of the case (50 cc of contrast total to patient.  Retrograde ordered pressures monitored to the case.  A 5 French straight endhole catheter was used to perform a pullback gradient across the ostial left extrailiac artery after administration of 200 mcg of intra-arterial nitroglycerin demonstrating a 16mm pullback gradient.  Angiographic Data: 1: Abdominal aorta-30 to 40% fairly focal distal abdominal aortic stenosis just above the iliac bifurcation 2: Left lower extremity-60 to 70% fairly focal ostial left extrailiac artery stenosis with a 60 mmHg pullback gradient 3: Right lower extremity-iliac arteries are widely patent   [name] [name] has a hemodynamically significant ostial left external iliac artery stenosis with a 60 mm gradient which corresponds to her duplex abnormality and is consistent with her symptoms.  Given the location at the origin of the hypogastric artery we will proceed with PTA and stenting using a nitinol self-expanding stent to preserve the hypogastric. Procedure Description: The existing 6 French sheath was exchanged over a 035 versa core wire for a 6 [place] Brite tip sheath.  Patient received a total of 10,500 units of heparin with an ending ACT of 293.  Omnipaque dye was used for the entirety of the intervention.  The lesion was predilated with a 6 mm x 2 cm balloon.  Following this a 8 mm  x 4 cm Abbott absolute Pro nitinol self-expanding stent was then carefully positioned and deployed across the diseased segment and postdilated with a 7 mm x 2 cm balloon resulting reduction of a 60 to 70% focal ostial left extrailiac artery stenosis to 0% residual.  The patient tolerated the procedure well.  The right tip sheath was then exchanged over a wire for a short 6 [place] sheath.  The femoral angiogram was performed in the LAO view which suggested the entry was mildly high precluding the use of a closure device.  Manual hold to be recommended.  The patient received 300 mg of clopidogrel p.o. Final Impression: Successful PTA and stenting using a nitinol self-expanding stent for a hemodynamically significant ostial left extrailiac artery stenosis in the setting of left lower extremity lifestyle-limiting claudication.  The sheath will be removed once ACT falls below 170 and pressure held.  Patient will be treated with DAPT.  She will be hydrated overnight and discharged home in the morning.  Will get lower extremity arterial Doppler studies in unrefined office in 1 to 2 weeks and I will see her back thereafter as an outpatient.  She left the lab in stable condition. Nanetta Batty. MD, [facility] 01/07/2023 9:12 AM    CUP PACEART REMOTE DEVICE CHECK  Result Date: 01/05/2023 Scheduled remote reviewed. Normal device function.  Next remote 91 days. LA, CVRS  Disposition   Pt is being discharged home today in good condition.  Follow-up Plans & Appointments     Follow-up Information     [facility] Follow up on 01/21/2023.   Specialty: Cardiology Why: 9:00AM. Lower extremity ultrasound to make sure stent is patent after recent procedure. Contact information: [address] [place] [address] [phone]        [name] Gess, MD Follow up on 01/27/2023.   Specialties: Cardiology, Radiology Why: 1:30PM. Post PV procedure follow up Contact  information: [address] [place] [address] [phone]                   Discharge Medications   Allergies as of 01/08/2023  Reactions   Zonisamide Anxiety   Panic Attacks   Aspirin Hives   Upset stomach   Ciprofloxacin    Seizures & it keeps her awake   Codeine Hives   Hydrocodone Hives   Oxycodone Hives   Penicillins Hives, Swelling   Pt tolerated ampicillin/sulbactam without issues 06/2020        Medication List     TAKE these medications    acetaminophen 500 MG tablet Commonly known as: TYLENOL Take 500-1,000 mg by mouth every 6 (six) hours as needed for moderate pain (pain).   albuterol 108 (90 Base) MCG/ACT inhaler Commonly known as: VENTOLIN HFA Inhale 2 puffs into the lungs every 6 (six) hours as needed for wheezing or shortness of breath.   amLODipine 5 MG tablet Commonly known as: NORVASC Take 5 mg by mouth daily.   aspirin EC 81 MG tablet Take 1 tablet (81 mg total) by mouth daily. Swallow whole. Start taking on: January 09, 2023   atorvastatin 20 MG tablet Commonly known as: LIPITOR Take 20 mg by mouth daily.   busPIRone 10 MG tablet Commonly known as: BUSPAR Take 10 mg by mouth 2 (two) times daily.   carvedilol 3.125 MG tablet Commonly known as: COREG TAKE 1 TABLET(3.125 MG) BY MOUTH TWICE DAILY   clopidogrel 75 MG tablet Commonly known as: PLAVIX Take 1 tablet (75 mg total) by mouth daily with breakfast. Start taking on: January 09, 2023   dapagliflozin propanediol 10 MG Tabs tablet Commonly known as: Farxiga Take 1 tablet (10 mg total) by mouth daily before breakfast.   escitalopram 20 MG tablet Commonly known as: LEXAPRO Take 1 tablet (20 mg total) by mouth daily.   fluticasone 50 MCG/ACT nasal spray Commonly known as: FLONASE Place 1-2 sprays into both nostrils daily as needed for allergies or rhinitis.   lamoTRIgine 25 MG tablet Commonly known as: LAMICTAL Take 2 tablets (50 mg total) by  mouth at bedtime.   lamoTRIgine 200 MG tablet Commonly known as: LAMICTAL Take 1 tablet (200 mg total) by mouth 2 (two) times daily.   montelukast 10 MG tablet Commonly known as: SINGULAIR Take 10 mg by mouth at bedtime.   Oxcarbazepine 300 MG tablet Commonly known as: TRILEPTAL Take 1 tablet (300 mg total) by mouth 2 (two) times daily.   pantoprazole 40 MG tablet Commonly known as: PROTONIX Take 1 tablet by mouth daily.   Spacer/Aero-Holding [employer] 2 Inhalers by Does not apply route in the morning and at bedtime.   spironolactone 25 MG tablet Commonly known as: ALDACTONE Take 0.5 tablets (12.5 mg total) by mouth daily.   valsartan 160 MG tablet Commonly known as: DIOVAN Take 320 mg by mouth daily.   Vitamin D3 50 MCG (2000 UT) capsule Take 1 capsule (2,000 Units total) by mouth daily.           Outstanding Labs/Studies   Pending LE duplex on 8/15  Duration of Discharge Encounter   Greater than 30 minutes including physician time.  Ramond Dial, PA 01/08/2023, 11:21 AM   ATTENDING ATTESTATION:  After conducting a review of all available clinical information with the care team, interviewing the patient, and performing a physical exam, I agree with the findings and plan described in this note.   GEN: No acute distress.   HEENT:  MMM, no JVD, no scleral icterus Cardiac: RRR, no murmurs, rubs, or gallops.  Respiratory: Clear to auscultation bilaterally. GI: Soft, nontender, non-distended  MS: No edema; No deformity. Neuro:  Nonfocal  Vasc:  +2 radial pulses; left femoral site clean, dry, intact  Patient doing well after uncomplicated left external iliac intervention from the left femoral arterial approach.  Her access site is stable, she is ambulated without any issues.  She has had no evidence of bleeding.  Discharge today with close hospital follow-up.   Alverda Skeans, MD Pager [phone]

## 2023-01-12 NOTE — Progress Notes (Signed)
Remote pacemaker transmission.   

## 2023-01-21 ENCOUNTER — Ambulatory Visit (HOSPITAL_BASED_OUTPATIENT_CLINIC_OR_DEPARTMENT_OTHER)
Admission: RE | Admit: 2023-01-21 | Discharge: 2023-01-21 | Disposition: A | Discharge status: Home / Self Care | Payer: Medicare HMO | Source: Ambulatory Visit | Attending: Cardiology | Admitting: Cardiology

## 2023-01-21 ENCOUNTER — Ambulatory Visit (HOSPITAL_COMMUNITY)
Admission: RE | Admit: 2023-01-21 | Discharge: 2023-01-21 | Disposition: A | Discharge status: Home / Self Care | Payer: Medicare HMO | Source: Ambulatory Visit | Attending: Cardiovascular Disease | Admitting: Cardiovascular Disease

## 2023-01-21 DIAGNOSIS — Z95820 Peripheral vascular angioplasty status with implants and grafts: Secondary | ICD-10-CM | POA: Insufficient documentation

## 2023-01-21 DIAGNOSIS — I739 Peripheral vascular disease, unspecified: Secondary | ICD-10-CM

## 2023-01-21 LAB — VAS US ABI WITH/WO TBI
Left ABI: 1
Right ABI: 1

## 2023-01-27 ENCOUNTER — Encounter: Payer: Self-pay | Admitting: Cardiovascular Disease

## 2023-01-27 ENCOUNTER — Ambulatory Visit: Payer: Medicare HMO | Attending: Cardiovascular Disease | Admitting: Cardiovascular Disease

## 2023-01-27 VITALS — BP 102/58 | HR 60 | Ht 61.5 in | Wt 186.6 lb

## 2023-01-27 DIAGNOSIS — I739 Peripheral vascular disease, unspecified: Secondary | ICD-10-CM | POA: Diagnosis not present

## 2023-01-27 NOTE — Progress Notes (Signed)
01/27/2023 [name] [name]   [birth year]  [id number]  Primary Physician Carin Hock, PA Primary Cardiologist: Runell Gess MD FACP, [place], [place], [place]  HPI:  [name] [name] is a 62 y.o.   moderately overweight divorced Caucasian female with no children who I last saw in the office 12/11/2022.  She was referred by Tereso Newcomer, PA-C-see for evaluation of lifestyle-limiting claudication.  She is disabled because of epilepsy.  Her risk factors include 25 pack years of tobacco abuse smoking 1/2 pack/day recalcitrant to risk factor modification.  History of hypertension and hyperlipidemia.  One of her sisters has had coronary intervention with stents.  She is never had a heart attack or stroke.  She denies chest pain or shortness of breath.  She has noticed left greater than right lower extremity claudication for the last several months with recent Doppler studies performed 12/02/2022 revealing a decreased left ABI of 0.62 with a high-frequency signal in her left extrailiac artery.  I performed peripheral angiography of her 01/07/2023 revealing of 70% distal left common iliac artery stenosis with a 60 mmHg gradient.  I performed PTA and stenting using an 8 mm x 40 mm long Abbott absolute Pro nitinol self-expanding stent with an excellent angiographic result.  She was discharged home the following day.  Her Dopplers normalized and her claudication has resolved.  She is on DAPT.     Current Meds  Medication Sig   acetaminophen (TYLENOL) 500 MG tablet Take 500-1,000 mg by mouth every 6 (six) hours as needed for moderate pain (pain).    albuterol (VENTOLIN HFA) 108 (90 Base) MCG/ACT inhaler Inhale 2 puffs into the lungs every 6 (six) hours as needed for wheezing or shortness of breath.   amLODipine (NORVASC) 5 MG tablet Take 5 mg by mouth daily.   aspirin EC 81 MG tablet Take 1 tablet (81 mg total) by mouth daily. Swallow whole.   atorvastatin (LIPITOR) 20 MG tablet Take 20 mg by mouth daily.    busPIRone (BUSPAR) 10 MG tablet Take 10 mg by mouth 2 (two) times daily.   carvedilol (COREG) 3.125 MG tablet TAKE 1 TABLET(3.125 MG) BY MOUTH TWICE DAILY   Cholecalciferol (VITAMIN D3) 2000 units capsule Take 1 capsule (2,000 Units total) by mouth daily.   clopidogrel (PLAVIX) 75 MG tablet Take 1 tablet (75 mg total) by mouth daily with breakfast.   dapagliflozin propanediol (FARXIGA) 10 MG TABS tablet Take 1 tablet (10 mg total) by mouth daily before breakfast.   escitalopram (LEXAPRO) 20 MG tablet Take 1 tablet (20 mg total) by mouth daily.   fluticasone (FLONASE) 50 MCG/ACT nasal spray Place 1-2 sprays into both nostrils daily as needed for allergies or rhinitis.   lamoTRIgine (LAMICTAL) 200 MG tablet Take 1 tablet (200 mg total) by mouth 2 (two) times daily.   lamoTRIgine (LAMICTAL) 25 MG tablet Take 2 tablets (50 mg total) by mouth at bedtime.   montelukast (SINGULAIR) 10 MG tablet Take 10 mg by mouth at bedtime.   Oxcarbazepine (TRILEPTAL) 300 MG tablet Take 1 tablet (300 mg total) by mouth 2 (two) times daily.   pantoprazole (PROTONIX) 40 MG tablet Take 1 tablet by mouth daily.   Spacer/Aero-Holding Chambers DEVI 2 Inhalers by Does not apply route in the morning and at bedtime.   spironolactone (ALDACTONE) 25 MG tablet Take 0.5 tablets (12.5 mg total) by mouth daily.   valsartan (DIOVAN) 160 MG tablet Take 320 mg by mouth daily.     Allergies  Allergen Reactions   Zonisamide Anxiety    Panic Attacks   Aspirin Hives    Upset stomach   Ciprofloxacin     Seizures & it keeps her awake   Codeine Hives   Hydrocodone Hives   Oxycodone Hives   Penicillins Hives and Swelling    Pt tolerated ampicillin/sulbactam without issues 06/2020    Social History   Socioeconomic History   Marital status: Single    Spouse name: Not on file   Number of children: 0   Years of education: 14   Highest education level: Not on file  Occupational History   Not on file  Tobacco Use   Smoking  status: Some Days    Current packs/day: 0.25    Average packs/day: 0.3 packs/day for 47.6 years (11.9 ttl pk-yrs)    Types: Cigarettes    Start date: 1977    Passive exposure: Current   Smokeless tobacco: Never   Tobacco comments:    Pt states she smokes 2 cigs a day and sometime 4 a week    01/27/2023 patient states she smoke 3 cigarettes on the days that she does smoke  Vaping Use   Vaping status: Never Used  Substance and Sexual Activity   Alcohol use: Not Currently    Comment: Quit 12/07/2015   Drug use: Never   Sexual activity: Not Currently    Birth control/protection: Post-menopausal  Other Topics Concern   Not on file  Social History Narrative   Lives with sister   Left handed   Drinks 1-2 cups caffeine daily   Social Determinants of Health   Financial Resource Strain: Not on file  Food Insecurity: No Food Insecurity (01/07/2023)   Hunger Vital Sign    Worried About Running Out of Food in the Last Year: Never true    Ran Out of Food in the Last Year: Never true  Transportation Needs: No Transportation Needs (01/07/2023)   PRAPARE - Administrator, Civil Service (Medical): No    Lack of Transportation (Non-Medical): No  Physical Activity: Not on file  Stress: Not on file  Social Connections: Not on file  Intimate Partner Violence: Not At Risk (01/07/2023)   Humiliation, Afraid, Rape, and Kick questionnaire    Fear of Current or Ex-Partner: No    Emotionally Abused: No    Physically Abused: No    Sexually Abused: No     Review of Systems: General: negative for chills, fever, night sweats or weight changes.  Cardiovascular: negative for chest pain, dyspnea on exertion, edema, orthopnea, palpitations, paroxysmal nocturnal dyspnea or shortness of breath Dermatological: negative for rash Respiratory: negative for cough or wheezing Urologic: negative for hematuria Abdominal: negative for nausea, vomiting, diarrhea, bright red blood per rectum, melena, or  hematemesis Neurologic: negative for visual changes, syncope, or dizziness All other systems reviewed and are otherwise negative except as noted above.    Blood pressure (!) 102/58, pulse 60, height 5' 1.5" (1.562 m), weight 186 lb 9.6 oz (84.6 kg), SpO2 96%.  General appearance: alert and no distress Neck: no adenopathy, no carotid bruit, no JVD, supple, symmetrical, trachea midline, and thyroid not enlarged, symmetric, no tenderness/mass/nodules Lungs: clear to auscultation bilaterally Heart: regular rate and rhythm, S1, S2 normal, no murmur, click, rub or gallop Extremities: extremities normal, atraumatic, no cyanosis or edema Pulses: 2+ and symmetric Skin: Skin color, texture, turgor normal. No rashes or lesions Neurologic: Grossly normal  EKG not performed today  ASSESSMENT AND PLAN:   PAD (peripheral artery disease) (HCC) Ms. Welle was referred to me for symptomatic PAD of her left lower extremity.  I performed angiography on her 01/07/2023 revealing 70% distal left common iliac artery stenosis which I stented with an 8 mm x 40 mm long absolute Pro nitinol self-expanding stent with an excellent result.  She was discharged home the following day.  Her Dopplers normalized and her claudication has resolved.  She is on aspirin clopidogrel.  Will get aortoiliac Dopplers in 6 months and I will see her back in 1 year for follow-up.     Runell Gess MD FACP,FACC,FAHA, [facility] 01/27/2023 1:50 PM

## 2023-01-27 NOTE — Patient Instructions (Signed)
Medication Instructions:  NO CHANGES  *If you need a refill on your cardiac medications before your next appointment, please call your pharmacy*    Testing/Procedures: Aorta/Iliac dopplers in 6 months   Follow-Up: At [facility], you and your health needs are our priority.  As part of our continuing mission to provide you with exceptional heart care, we have created designated Provider Care Teams.  These Care Teams include your primary Cardiologist (physician) and Advanced Practice Providers (APPs -  Physician Assistants and Nurse Practitioners) who all work together to provide you with the care you need, when you need it.  We recommend signing up for the patient portal called "MyChart".  Sign up information is provided on this After Visit Summary.  MyChart is used to connect with patients for Virtual Visits (Telemedicine).  Patients are able to view lab/test results, encounter notes, upcoming appointments, etc.  Non-urgent messages can be sent to your provider as well.   To learn more about what you can do with MyChart, go to [email or web address].    Your next appointment:   12 months with Dr. Allyson Sabal

## 2023-01-27 NOTE — Assessment & Plan Note (Signed)
[name] [name] was referred to me for symptomatic PAD of her left lower extremity.  I performed angiography on her 01/07/2023 revealing 70% distal left common iliac artery stenosis which I stented with an 8 mm x 40 mm long absolute Pro nitinol self-expanding stent with an excellent result.  She was discharged home the following day.  Her Dopplers normalized and her claudication has resolved.  She is on aspirin clopidogrel.  Will get aortoiliac Dopplers in 6 months and I will see her back in 1 year for follow-up.

## 2023-02-15 DIAGNOSIS — Z950 Presence of cardiac pacemaker: Secondary | ICD-10-CM | POA: Diagnosis not present

## 2023-02-15 DIAGNOSIS — I442 Atrioventricular block, complete: Secondary | ICD-10-CM | POA: Diagnosis not present

## 2023-02-15 DIAGNOSIS — Z9181 History of falling: Secondary | ICD-10-CM | POA: Diagnosis not present

## 2023-02-15 DIAGNOSIS — Z1211 Encounter for screening for malignant neoplasm of colon: Secondary | ICD-10-CM | POA: Diagnosis not present

## 2023-02-15 DIAGNOSIS — I429 Cardiomyopathy, unspecified: Secondary | ICD-10-CM | POA: Diagnosis not present

## 2023-02-15 DIAGNOSIS — Z0000 Encounter for general adult medical examination without abnormal findings: Secondary | ICD-10-CM | POA: Diagnosis not present

## 2023-02-18 NOTE — Progress Notes (Deleted)
PATIENT: [name] [name] DOB: 02-Jul-1960  REASON FOR VISIT: follow up HISTORY FROM: patient, sister Primary Neurologist: Dr. Dalia Heading. [place]   HISTORY OF PRESENT ILLNESS: Today 02/18/23 EEG was normal February 2024.  Update 07/28/22 SS: Here with her sister to discuss recent seizure activity. About 3 weeks ago standing in line at drug store, vision dimmed with tunnel vision, leaned against counter, staff helped her, she came around was still about to function talk. Few days ago, fell over the side of her recliner, few seconds was out of it. Was a little confused. Another time, got up from chair, leaned backwards, like frozen, pushing the chair back. Remains on Lamictal 200/250 mg daily, Trileptal 300 mg twice daily, no missed doses. Taking 2 weeks of prednisone taper (2 pills daily, then 1 pill the 2nd week) when seizures started. Yesterday under a lot of stress, had no events. When on prednisone, felt couldn't walk straight. Was on for shin splints.  Doesn't drive a car. Has been placed on gabapentin up to 300 mg TID, has shingles, present for 2 weeks. They think prednisone caused seizure events.   -March 2015 EEG showed brief episodes of theta slowing from the left temporal region -Video monitoring EEG at [facility], she didn't have any seizure events -[facility] placed her on Zonegran, but had anxiety and diarrhea, Lamictal was started in 2016 -MRI of the brain has question the possibility of mesial temporal sclerosis on the right -Pacemaker placed in 2016 due to complete heart block, question if this may be the etiology of seizure type events? -EEG during hospitalization January 2022 was normal -CT head November 2023 showed generalized cerebral atrophy microvascular disease  Update 05/20/2022 SS: Here today alone, her sister drove her. Has been having BP issues, in the ER 05/07/22, med adjustment, seems to be helping. Takes Trileptal 300 mg BID, Lamictal 200/250 mg. Under some stress with  another sister moving in, during an argument may have had seizure staring off for 1 minute. Still can have gait instability. 1 fall when leaning forward.   Update 05/20/21 SS: [name] here today for follow-up of history of seizure disorder.  Remains on Trileptal and Lamictal.  Last seizure was in January 2022 when she was significantly ill. At last visit in Jan 2022 Lamictal level was 10.7, Trileptal was 17. No seizures of recent. Still lives with sister, doesn't drive a car. Other than lingering cough, no health issues. If she turns a corner, she can veer off, this is not new. There have been no falls. Here today alone. In the past the dosage of Trileptal has been reduced to help with gait stability without change, the dose was increased back. Has pacemaker.   Update 11/11/2020 SS: [name] [name] is a 62 year old female with history of seizure disorder.  Last seizure was in January 2022, when she had significant illness requiring intubation with hypoxia and elevated blood glucose.  She is on Trileptal and Lamictal.  No further seizure events. Both medications were therapeutic at last office visit.  She is living with her sister.  Is currently not driving a car.  Has a pacemaker, sees cardiology. Claims has been doing well. At times feels dizzy, no falls.  Here today for evaluation unaccompanied.  HISTORY 07/08/2020 Dr. Anne Hahn: [name] [name] is a 62 year old left-handed white female with a history of a seizure disorder.  The patient has been treated with Trileptal and lamotrigine, she has not had a seizure since December 2019.  Unfortunately, the patient developed a  gradual onset of shortness of breath several days prior to a June 19, 2020 admission to the hospital.  The patient began having severe shortness of breath on the day of admission, EMS was called and the patient had a seizure prior to going in the hospital.  She was noted to have oxygen saturations in the 60% range at that time, her white  blood count was 18.6 when she went in the hospital.  She was felt to have an aspiration pneumonia and required intubation.  During the hospitalization, she was extubated and then had another seizure following extubation and then had to be reintubated again.  The patient never had blood levels of the Trileptal or lamotrigine done on admission.  Her ammonia level is 147 with the upper range of normal being 35.  The patient has since been discharged from the hospital, she remains on her current dose of the lamotrigine taking 200 mg twice daily and 50 mg in the evening and she is on Trileptal 300 mg twice daily.  Higher levels of Trileptal in the past have resulted in increased gait instability.  The patient has not had any seizures since coming out of the hospital.  She is not operating a motor vehicle.  She reports some gait instability and fatigue and drowsiness at times, she has not had any falls.  She has not had any further seizures coming out of the hospital.  She reports occasional migratory head pains consistent with "ice pick pains".  She denies any numbness or weakness of the extremities.  She has a pacemaker in place, she cannot have MRI.  A CT scan of the brain was done in the hospital and was unremarkable.   REVIEW OF SYSTEMS: Out of a complete 14 system review of symptoms, the patient complains only of the following symptoms, and all other reviewed systems are negative.  See HPI  ALLERGIES: Allergies  Allergen Reactions   Zonisamide Anxiety    Panic Attacks   Aspirin Hives    Upset stomach   Ciprofloxacin     Seizures & it keeps her awake   Codeine Hives   Hydrocodone Hives   Oxycodone Hives   Penicillins Hives and Swelling    Pt tolerated ampicillin/sulbactam without issues 06/2020   HOME MEDICATIONS: Outpatient Medications Prior to Visit  Medication Sig Dispense Refill   acetaminophen (TYLENOL) 500 MG tablet Take 500-1,000 mg by mouth every 6 (six) hours as needed for moderate  pain (pain).      albuterol (VENTOLIN HFA) 108 (90 Base) MCG/ACT inhaler Inhale 2 puffs into the lungs every 6 (six) hours as needed for wheezing or shortness of breath. 8 g 11   amLODipine (NORVASC) 5 MG tablet Take 5 mg by mouth daily.     aspirin EC 81 MG tablet Take 1 tablet (81 mg total) by mouth daily. Swallow whole.     atorvastatin (LIPITOR) 20 MG tablet Take 20 mg by mouth daily.     busPIRone (BUSPAR) 10 MG tablet Take 10 mg by mouth 2 (two) times daily.     carvedilol (COREG) 3.125 MG tablet TAKE 1 TABLET(3.125 MG) BY MOUTH TWICE DAILY 180 tablet 3   Cholecalciferol (VITAMIN D3) 2000 units capsule Take 1 capsule (2,000 Units total) by mouth daily. 90 capsule 0   clopidogrel (PLAVIX) 75 MG tablet Take 1 tablet (75 mg total) by mouth daily with breakfast. 90 tablet 1   dapagliflozin propanediol (FARXIGA) 10 MG TABS tablet Take 1 tablet (10 mg total)  by mouth daily before breakfast. 90 tablet 3   escitalopram (LEXAPRO) 20 MG tablet Take 1 tablet (20 mg total) by mouth daily. 30 tablet 0   fluticasone (FLONASE) 50 MCG/ACT nasal spray Place 1-2 sprays into both nostrils daily as needed for allergies or rhinitis.     lamoTRIgine (LAMICTAL) 200 MG tablet Take 1 tablet (200 mg total) by mouth 2 (two) times daily. 180 tablet 3   lamoTRIgine (LAMICTAL) 25 MG tablet Take 2 tablets (50 mg total) by mouth at bedtime. 180 tablet 3   montelukast (SINGULAIR) 10 MG tablet Take 10 mg by mouth at bedtime.     Oxcarbazepine (TRILEPTAL) 300 MG tablet Take 1 tablet (300 mg total) by mouth 2 (two) times daily. 180 tablet 3   pantoprazole (PROTONIX) 40 MG tablet Take 1 tablet by mouth daily.     Spacer/Aero-Holding Chambers DEVI 2 Inhalers by Does not apply route in the morning and at bedtime. 1 each 1   spironolactone (ALDACTONE) 25 MG tablet Take 0.5 tablets (12.5 mg total) by mouth daily. 45 tablet 3   valsartan (DIOVAN) 160 MG tablet Take 320 mg by mouth daily.     No facility-administered medications  prior to visit.    PAST MEDICAL HISTORY: Past Medical History:  Diagnosis Date   Arthritis    CHF (congestive heart failure) (HCC)    Chronic cough    Chronic insomnia 03/07/2015   Diverticulitis    GERD (gastroesophageal reflux disease)    Gout    Hiatal hernia    Hypertension    LBBB (left bundle branch block)    Pacemaker    Medtronic due to heart block   PAD (peripheral artery disease) (HCC) 11/03/2022   ABIs/arterial Dopplers 12/03/2022: Bilateral EIA >50, mild atherosclerosis throughout // ABIs-right 0.97, left 0.62   Panic disorder 09/07/2014   Pneumonia    x 1 - Walking   Seizures (HCC)    Last Seizure in 2017 - controlled with meds    Sleep apnea    does not use a CPAP due to grand mal seizure   Wrist tendonitis    R     PAST SURGICAL HISTORY: Past Surgical History:  Procedure Laterality Date   ABDOMINAL AORTOGRAM W/LOWER EXTREMITY N/A 01/07/2023   Procedure: ABDOMINAL AORTOGRAM W/LOWER EXTREMITY;  Surgeon: Runell Gess, MD;  Location: [facility] INVASIVE CV LAB;  Service: Cardiovascular;  Laterality: N/A;   COLONOSCOPY  last 03/31/2016   COLONOSCOPY WITH PROPOFOL N/A 04/04/2019   Procedure: COLONOSCOPY WITH PROPOFOL;  Surgeon: Napoleon Form, MD;  Location: [facility] ENDOSCOPY;  Service: Endoscopy;  Laterality: N/A;   COLONOSCOPY WITH PROPOFOL N/A 06/15/2019   Procedure: COLONOSCOPY WITH PROPOFOL;  Surgeon: Meridee Score Netty Starring., MD;  Location: [facility] ENDOSCOPY;  Service: Gastroenterology;  Laterality: N/A;   COLONOSCOPY WITH PROPOFOL N/A 02/28/2020   Procedure: COLONOSCOPY WITH PROPOFOL;  Surgeon: Meridee Score Netty Starring., MD;  Location: [facility] ENDOSCOPY;  Service: Gastroenterology;  Laterality: N/A;   COLONOSCOPY WITH PROPOFOL N/A 06/11/2022   Procedure: COLONOSCOPY WITH PROPOFOL;  Surgeon: Meridee Score Netty Starring., MD;  Location: [facility] ENDOSCOPY;  Service: Gastroenterology;  Laterality: N/A;   ENDOROTOR  02/28/2020   Procedure: ZOXWRUEAV;  Surgeon: Mansouraty, Netty Starring., MD;   Location: [facility] ENDOSCOPY;  Service: Gastroenterology;;   ENDOSCOPIC MUCOSAL RESECTION N/A 06/15/2019   Procedure: ENDOSCOPIC MUCOSAL RESECTION;  Surgeon: Lemar Lofty., MD;  Location: [facility] ENDOSCOPY;  Service: Gastroenterology;  Laterality: N/A;   ENDOSCOPIC MUCOSAL RESECTION N/A 02/28/2020   Procedure: ENDOSCOPIC MUCOSAL RESECTION;  Surgeon: Lemar Lofty., MD;  Location: [facility] ENDOSCOPY;  Service: Gastroenterology;  Laterality: N/A;   ENDOSCOPIC MUCOSAL RESECTION N/A 06/11/2022   Procedure: ENDOSCOPIC MUCOSAL RESECTION;  Surgeon: Meridee Score Netty Starring., MD;  Location: [facility] ENDOSCOPY;  Service: Gastroenterology;  Laterality: N/A;   EP IMPLANTABLE DEVICE N/A 12/13/2014   Procedure: Pacemaker Implant;  Surgeon: Duke Salvia, MD;  Location: [facility] INVASIVE CV LAB;  Service: Cardiovascular;  Laterality: N/A;   HAND SURGERY     HEMOSTASIS CLIP PLACEMENT  06/15/2019   Procedure: HEMOSTASIS CLIP PLACEMENT;  Surgeon: Lemar Lofty., MD;  Location: [facility] ENDOSCOPY;  Service: Gastroenterology;;   HEMOSTASIS CLIP PLACEMENT  02/28/2020   Procedure: HEMOSTASIS CLIP PLACEMENT;  Surgeon: Lemar Lofty., MD;  Location: [facility] ENDOSCOPY;  Service: Gastroenterology;;   HEMOSTASIS CLIP PLACEMENT  06/11/2022   Procedure: HEMOSTASIS CLIP PLACEMENT;  Surgeon: Lemar Lofty., MD;  Location: [facility] ENDOSCOPY;  Service: Gastroenterology;;   HEMOSTASIS CONTROL  02/28/2020   Procedure: HEMOSTASIS CONTROL;  Surgeon: Lemar Lofty., MD;  Location: [facility] ENDOSCOPY;  Service: Gastroenterology;;   POLYPECTOMY     POLYPECTOMY  04/04/2019   Procedure: POLYPECTOMY;  Surgeon: Napoleon Form, MD;  Location: [facility] ENDOSCOPY;  Service: Endoscopy;;   POLYPECTOMY  06/15/2019   Procedure: POLYPECTOMY;  Surgeon: Lemar Lofty., MD;  Location: [facility] ENDOSCOPY;  Service: Gastroenterology;;   POLYPECTOMY  06/11/2022   Procedure: POLYPECTOMY;  Surgeon: Lemar Lofty., MD;  Location: [facility] ENDOSCOPY;   Service: Gastroenterology;;   SKIN CANCER EXCISION     SUBMUCOSAL LIFTING INJECTION  06/15/2019   Procedure: SUBMUCOSAL LIFTING INJECTION;  Surgeon: Lemar Lofty., MD;  Location: [facility] ENDOSCOPY;  Service: Gastroenterology;;   SUBMUCOSAL LIFTING INJECTION  06/11/2022   Procedure: SUBMUCOSAL LIFTING INJECTION;  Surgeon: Lemar Lofty., MD;  Location: [facility] ENDOSCOPY;  Service: Gastroenterology;;   TUBAL LIGATION     UPPER GI ENDOSCOPY     WISDOM TOOTH EXTRACTION      FAMILY HISTORY: Family History  Problem Relation Age of Onset   Allergic rhinitis Mother    Alzheimer's disease Mother    Hypertension Mother    Colon polyps Mother    Allergic rhinitis Father    Hypertension Father    Stroke Father    Leukemia Father    Prostate cancer Father    Colon polyps Father    Allergic rhinitis Sister    Colon polyps Sister    Allergic rhinitis Sister    Kidney failure Sister    Diabetes Sister    Colon polyps Sister    Allergic rhinitis Brother    Stroke Brother    Hypertension Brother    Sleep apnea Brother    Colon polyps Brother    Colon cancer Paternal Aunt    Colon cancer Paternal Uncle    Parkinson's disease Maternal Grandmother    Coronary artery disease Maternal Grandfather    Esophageal cancer Neg Hx    Stomach cancer Neg Hx    Rectal cancer Neg Hx    Inflammatory bowel disease Neg Hx    Liver disease Neg Hx    Pancreatic cancer Neg Hx    Seizures Neg Hx     SOCIAL HISTORY: Social History   Socioeconomic History   Marital status: Single    Spouse name: Not on file   Number of children: 0   Years of education: 14   Highest education level: Not on file  Occupational History   Not on file  Tobacco Use  Smoking status: Some Days    Current packs/day: 0.25    Average packs/day: 0.3 packs/day for 47.7 years (11.9 ttl pk-yrs)    Types: Cigarettes    Start date: 1977    Passive exposure: Current   Smokeless tobacco: Never   Tobacco comments:    Pt  states she smokes 2 cigs a day and sometime 4 a week    01/27/2023 patient states she smoke 3 cigarettes on the days that she does smoke  Vaping Use   Vaping status: Never Used  Substance and Sexual Activity   Alcohol use: Not Currently    Comment: Quit 12/07/2015   Drug use: Never   Sexual activity: Not Currently    Birth control/protection: Post-menopausal  Other Topics Concern   Not on file  Social History Narrative   Lives with sister   Left handed   Drinks 1-2 cups caffeine daily   Social Determinants of Health   Financial Resource Strain: Not on file  Food Insecurity: No Food Insecurity (01/07/2023)   Hunger Vital Sign    Worried About Running Out of Food in the Last Year: Never true    Ran Out of Food in the Last Year: Never true  Transportation Needs: No Transportation Needs (01/07/2023)   PRAPARE - Administrator, Civil Service (Medical): No    Lack of Transportation (Non-Medical): No  Physical Activity: Not on file  Stress: Not on file  Social Connections: Not on file  Intimate Partner Violence: Not At Risk (01/07/2023)   Humiliation, Afraid, Rape, and Kick questionnaire    Fear of Current or Ex-Partner: No    Emotionally Abused: No    Physically Abused: No    Sexually Abused: No   PHYSICAL EXAM  There were no vitals filed for this visit.   There is no height or weight on file to calculate BMI.  Generalized: Well developed, in no acute distress  Neurological examination  Mentation: Alert oriented to time, place, history taking. Follows all commands speech and language fluent Cranial nerve II-XII: Pupils were equal round reactive to light. Extraocular movements were full, visual field were full on confrontational test. Facial sensation and strength were normal. Head turning and shoulder shrug  were normal and symmetric. Motor: The motor testing reveals 5 over 5 strength of all 4 extremities. Good symmetric motor tone is noted throughout.  Sensory:  Sensory testing is intact to soft touch on all 4 extremities. No evidence of extinction is noted.  Coordination: Cerebellar testing reveals good finger-nose-finger and heel-to-shin bilaterally.  Gait and station: Gait is normal, independent Reflexes: Deep tendon reflexes are symmetric and normal bilaterally.   DIAGNOSTIC DATA (LABS, IMAGING, TESTING) - I reviewed patient records, labs, notes, testing and imaging myself where available.  Lab Results  Component Value Date   WBC 9.7 01/08/2023   HGB 12.3 01/08/2023   HCT 38.1 01/08/2023   MCV 96.7 01/08/2023   PLT 271 01/08/2023      Component Value Date/Time   NA 139 01/08/2023 0031   NA 140 01/05/2023 1506   K 4.0 01/08/2023 0031   CL 106 01/08/2023 0031   CO2 23 01/08/2023 0031   GLUCOSE 125 (H) 01/08/2023 0031   BUN 9 01/08/2023 0031   BUN 9 01/05/2023 1506   CREATININE 1.42 (H) 01/08/2023 0031   CALCIUM 9.0 01/08/2023 0031   PROT 6.7 07/28/2022 0920   ALBUMIN 4.4 07/28/2022 0920   AST 15 07/28/2022 0920   ALT 17 07/28/2022 0920  ALKPHOS 167 (H) 07/28/2022 0920   BILITOT 0.5 07/28/2022 0920   GFRNONAA 42 (L) 01/08/2023 0031   GFRAA 53 (L) 07/29/2020 0902   Lab Results  Component Value Date   CHOL 140 01/08/2023   HDL 36 (L) 01/08/2023   LDLCALC 68 01/08/2023   LDLDIRECT 199.0 03/02/2016   TRIG 180 (H) 01/08/2023   CHOLHDL 3.9 01/08/2023   Lab Results  Component Value Date   HGBA1C 4.9 06/19/2020   Lab Results  Component Value Date   VITAMINB12 177 (L) 04/11/2015   Lab Results  Component Value Date   TSH 1.399 12/12/2014   ASSESSMENT AND PLAN 62 y.o. year old female  has a past medical history of Arthritis, CHF (congestive heart failure) (HCC), Chronic cough, Chronic insomnia (03/07/2015), Diverticulitis, GERD (gastroesophageal reflux disease), Gout, Hiatal hernia, Hypertension, LBBB (left bundle branch block), Pacemaker, PAD (peripheral artery disease) (HCC) (11/03/2022), Panic disorder (09/07/2014),  Pneumonia, Seizures (HCC), Sleep apnea, and Wrist tendonitis. here with:  1.  History of seizures, with recent occurrence   -Events occurred while taking a prednisone taper, she feels this was the causative factor.  Has been compliant with current seizure medication Lamictal 200 mg AM/250 mg PM, Trileptal 300 mg twice daily -Check EEG, check basic labs; in December 2023 Lamictal level 8.3, Trileptal 15 -She is neurologically intact, seems to be doing quite well today -If any further seizure events would recommend increasing Lamictal 250 mg BID -No driving until seizure-free 6 months -Historically, has had difficulty tolerating seizure medication has previously taken Keppra, Zonegran with side effects, higher doses of Trileptal resulted in gait instability -Follow-up in 6 months or sooner if needed  Margie Ege, AGNP-C, DNP 02/18/2023, 4:10 PM [facility] [address], [address] [place], [place] [address] [phone]

## 2023-02-23 ENCOUNTER — Ambulatory Visit: Payer: Medicare HMO | Admitting: Neurology

## 2023-02-24 ENCOUNTER — Telehealth: Payer: Self-pay | Admitting: Gastroenterology

## 2023-02-24 ENCOUNTER — Telehealth: Payer: Self-pay

## 2023-02-24 ENCOUNTER — Other Ambulatory Visit: Payer: Self-pay

## 2023-02-24 DIAGNOSIS — K635 Polyp of colon: Secondary | ICD-10-CM

## 2023-02-24 MED ORDER — NA SULFATE-K SULFATE-MG SULF 17.5-3.13-1.6 GM/177ML PO SOLN: 1.0000 | Freq: Once | ORAL | 0 refills | Status: AC

## 2023-02-24 NOTE — Telephone Encounter (Signed)
Colon Endo  scheduled, pt instructed and medications reviewed.  Patient instructions mailed to home.  Patient to call with any questions or concerns.   Letter to be sent to Dr Graciela Husbands for plavix hold.

## 2023-02-24 NOTE — Telephone Encounter (Signed)
[facility] Pre-operative Risk Assessment     Request for surgical clearance:     Endoscopy Procedure  What type of surgery is being performed?     Colon Endo   When is this surgery scheduled?     04/08/23  What type of clearance is required ?   Pharmacy  Are there any medications that need to be held prior to surgery and how long? Plavix   Practice name and name of physician performing surgery?      [facility]  What is your office phone and fax number?      Phone- [phone]  Fax- [phone]  Anesthesia type (None, local, MAC, general) ?       MAC

## 2023-02-24 NOTE — Telephone Encounter (Signed)
Inbound call from patient requesting to discuss recall colonoscopy. Patient states she would rather not have it at [facility] if possible. Patient requesting  a call back to discuss further. Please advise, thank you.

## 2023-02-24 NOTE — Telephone Encounter (Signed)
Colon has been set up for 04/08/23 at 245 pm at [facility] with GM   Left message on machine to call back

## 2023-02-25 ENCOUNTER — Ambulatory Visit: Payer: Medicare HMO | Admitting: Neurology

## 2023-02-25 ENCOUNTER — Encounter: Payer: Self-pay | Admitting: Neurology

## 2023-02-25 VITALS — BP 145/59 | HR 71 | Ht 61.5 in | Wt 189.5 lb

## 2023-02-25 DIAGNOSIS — R569 Unspecified convulsions: Secondary | ICD-10-CM | POA: Diagnosis not present

## 2023-02-25 MED ORDER — LAMOTRIGINE 25 MG PO TABS: 50.0000 mg | ORAL_TABLET | Freq: Two times a day (BID) | ORAL | 3 refills | Status: DC

## 2023-02-25 MED ORDER — LAMOTRIGINE 200 MG PO TABS: 200.0000 mg | ORAL_TABLET | Freq: Two times a day (BID) | ORAL | 3 refills | Status: DC

## 2023-02-25 MED ORDER — OXCARBAZEPINE 300 MG PO TABS
300.0000 mg | ORAL_TABLET | Freq: Two times a day (BID) | ORAL | 3 refills | Status: DC
Start: 2023-02-25 — End: 2023-07-12

## 2023-02-25 NOTE — Progress Notes (Signed)
PATIENT: [name] [name] DOB: Aug 13, 1960  REASON FOR VISIT: follow up HISTORY FROM: patient Primary Neurologist: Dr. Dalia Heading. [name]   ASSESSMENT AND PLAN 62 y.o. year old female  has a past medical history of Arthritis, CHF (congestive heart failure) (HCC), Chronic cough, Chronic insomnia (03/07/2015), Diverticulitis, GERD (gastroesophageal reflux disease), Gout, Hiatal hernia, Hypertension, LBBB (left bundle branch block), Pacemaker, PAD (peripheral artery disease) (HCC) (11/03/2022), Panic disorder (09/07/2014), Pneumonia, Seizures (HCC), Sleep apnea, and Wrist tendonitis. here with:  1.  History of seizures, with recent occurrence   -Describes 2 recent spells of seizure-like activity -Increase Lamictal 250 mg BID, continue Trileptal 300 mg twice daily -EEG was normal in February 2024 -Check routine labs today -If seizures continue, consider prolonged ambulatory EEG -Historically, has had difficulty tolerating seizure medication has previously taken Keppra, Zonegran with side effects, higher doses of Trileptal resulted in gait instability -Follow-up in 6 months or sooner if needed, call for seizures  Meds ordered this encounter  Medications   lamoTRIgine (LAMICTAL) 25 MG tablet    Sig: Take 2 tablets (50 mg total) by mouth 2 (two) times daily.    Dispense:  360 tablet    Refill:  3    To be taken with the 200 mg tablet twice daily   lamoTRIgine (LAMICTAL) 200 MG tablet    Sig: Take 1 tablet (200 mg total) by mouth 2 (two) times daily.    Dispense:  180 tablet    Refill:  3   Oxcarbazepine (TRILEPTAL) 300 MG tablet    Sig: Take 1 tablet (300 mg total) by mouth 2 (two) times daily.    Dispense:  180 tablet    Refill:  3   HISTORY OF PRESENT ILLNESS: Today 02/25/23 EEG was normal February 2024. Remains on Trileptal 300 mg BID, Lamictal 200/250. Reports 3 weeks ago, saw stars in her vision for 30 seconds, felt triggered by her sisters arguing, again 2 months ago. No  loss of consciousness, felt tired when she stood. For last few years has been typical spell, usually triggered during stress. Doesn't drive.   Update 07/28/22 SS: Here with her sister to discuss recent seizure activity. About 3 weeks ago standing in line at drug store, vision dimmed with tunnel vision, leaned against counter, staff helped her, she came around was still about to function talk. Few days ago, fell over the side of her recliner, few seconds was out of it. Was a little confused. Another time, got up from chair, leaned backwards, like frozen, pushing the chair back. Remains on Lamictal 200/250 mg daily, Trileptal 300 mg twice daily, no missed doses. Taking 2 weeks of prednisone taper (2 pills daily, then 1 pill the 2nd week) when seizures started. Yesterday under a lot of stress, had no events. When on prednisone, felt couldn't walk straight. Was on for shin splints.  Doesn't drive a car. Has been placed on gabapentin up to 300 mg TID, has shingles, present for 2 weeks. They think prednisone caused seizure events.   -March 2015 EEG showed brief episodes of theta slowing from the left temporal region -Video monitoring EEG at [facility], she didn't have any seizure events -[facility] placed her on Zonegran, but had anxiety and diarrhea, Lamictal was started in 2016 -MRI of the brain has question the possibility of mesial temporal sclerosis on the right -Pacemaker placed in 2016 due to complete heart block, question if this may be the etiology of seizure type events? -EEG during hospitalization January 2022 was normal -  CT head November 2023 showed generalized cerebral atrophy microvascular disease  Update 05/20/2022 SS: Here today alone, her sister drove her. Has been having BP issues, in the ER 05/07/22, med adjustment, seems to be helping. Takes Trileptal 300 mg BID, Lamictal 200/250 mg. Under some stress with another sister moving in, during an argument may have had seizure staring off for 1 minute.  Still can have gait instability. 1 fall when leaning forward.   Update 05/20/21 SS: [name] [name] here today for follow-up of history of seizure disorder.  Remains on Trileptal and Lamictal.  Last seizure was in January 2022 when she was significantly ill. At last visit in Jan 2022 Lamictal level was 10.7, Trileptal was 17. No seizures of recent. Still lives with sister, doesn't drive a car. Other than lingering cough, no health issues. If she turns a corner, she can veer off, this is not new. There have been no falls. Here today alone. In the past the dosage of Trileptal has been reduced to help with gait stability without change, the dose was increased back. Has pacemaker.   Update 11/11/2020 SS: [name] [name] is a 62 year old female with history of seizure disorder.  Last seizure was in January 2022, when she had significant illness requiring intubation with hypoxia and elevated blood glucose.  She is on Trileptal and Lamictal.  No further seizure events. Both medications were therapeutic at last office visit.  She is living with her sister.  Is currently not driving a car.  Has a pacemaker, sees cardiology. Claims has been doing well. At times feels dizzy, no falls.  Here today for evaluation unaccompanied.  HISTORY 07/08/2020 [name] [name]: [name] [name] is a 62 year old left-handed white female with a history of a seizure disorder.  The patient has been treated with Trileptal and lamotrigine, she has not had a seizure since December 2019.  Unfortunately, the patient developed a gradual onset of shortness of breath several days prior to a June 19, 2020 admission to the hospital.  The patient began having severe shortness of breath on the day of admission, EMS was called and the patient had a seizure prior to going in the hospital.  She was noted to have oxygen saturations in the 60% range at that time, her white blood count was 18.6 when she went in the hospital.  She was felt to have an aspiration  pneumonia and required intubation.  During the hospitalization, she was extubated and then had another seizure following extubation and then had to be reintubated again.  The patient never had blood levels of the Trileptal or lamotrigine done on admission.  Her ammonia level is 147 with the upper range of normal being 35.  The patient has since been discharged from the hospital, she remains on her current dose of the lamotrigine taking 200 mg twice daily and 50 mg in the evening and she is on Trileptal 300 mg twice daily.  Higher levels of Trileptal in the past have resulted in increased gait instability.  The patient has not had any seizures since coming out of the hospital.  She is not operating a motor vehicle.  She reports some gait instability and fatigue and drowsiness at times, she has not had any falls.  She has not had any further seizures coming out of the hospital.  She reports occasional migratory head pains consistent with "ice pick pains".  She denies any numbness or weakness of the extremities.  She has a pacemaker in place, she cannot have MRI.  A CT scan of the brain was done in the hospital and was unremarkable.   REVIEW OF SYSTEMS: Out of a complete 14 system review of symptoms, the patient complains only of the following symptoms, and all other reviewed systems are negative.  See HPI  ALLERGIES: Allergies  Allergen Reactions   Zonisamide Anxiety    Panic Attacks   Aspirin Hives    Upset stomach   Ciprofloxacin     Seizures & it keeps her awake   Codeine Hives   Hydrocodone Hives   Oxycodone Hives   Penicillins Hives and Swelling    Pt tolerated ampicillin/sulbactam without issues 06/2020   HOME MEDICATIONS: Outpatient Medications Prior to Visit  Medication Sig Dispense Refill   acetaminophen (TYLENOL) 500 MG tablet Take 500-1,000 mg by mouth every 6 (six) hours as needed for moderate pain (pain).      albuterol (VENTOLIN HFA) 108 (90 Base) MCG/ACT inhaler Inhale 2 puffs  into the lungs every 6 (six) hours as needed for wheezing or shortness of breath. 8 g 11   amLODipine (NORVASC) 5 MG tablet Take 5 mg by mouth daily.     aspirin EC 81 MG tablet Take 1 tablet (81 mg total) by mouth daily. Swallow whole.     atorvastatin (LIPITOR) 20 MG tablet Take 20 mg by mouth daily.     busPIRone (BUSPAR) 10 MG tablet Take 10 mg by mouth 2 (two) times daily.     carvedilol (COREG) 3.125 MG tablet TAKE 1 TABLET(3.125 MG) BY MOUTH TWICE DAILY 180 tablet 3   Cholecalciferol (VITAMIN D3) 2000 units capsule Take 1 capsule (2,000 Units total) by mouth daily. 90 capsule 0   clopidogrel (PLAVIX) 75 MG tablet Take 1 tablet (75 mg total) by mouth daily with breakfast. 90 tablet 1   dapagliflozin propanediol (FARXIGA) 10 MG TABS tablet Take 1 tablet (10 mg total) by mouth daily before breakfast. 90 tablet 3   escitalopram (LEXAPRO) 20 MG tablet Take 1 tablet (20 mg total) by mouth daily. 30 tablet 0   fluticasone (FLONASE) 50 MCG/ACT nasal spray Place 1-2 sprays into both nostrils daily as needed for allergies or rhinitis.     lamoTRIgine (LAMICTAL) 200 MG tablet Take 1 tablet (200 mg total) by mouth 2 (two) times daily. 180 tablet 3   lamoTRIgine (LAMICTAL) 25 MG tablet Take 2 tablets (50 mg total) by mouth at bedtime. 180 tablet 3   montelukast (SINGULAIR) 10 MG tablet Take 10 mg by mouth at bedtime.     Oxcarbazepine (TRILEPTAL) 300 MG tablet Take 1 tablet (300 mg total) by mouth 2 (two) times daily. 180 tablet 3   pantoprazole (PROTONIX) 40 MG tablet Take 1 tablet by mouth daily.     Spacer/Aero-Holding Chambers DEVI 2 Inhalers by Does not apply route in the morning and at bedtime. 1 each 1   spironolactone (ALDACTONE) 25 MG tablet Take 0.5 tablets (12.5 mg total) by mouth daily. 45 tablet 3   valsartan (DIOVAN) 160 MG tablet Take 320 mg by mouth daily.     No facility-administered medications prior to visit.    PAST MEDICAL HISTORY: Past Medical History:  Diagnosis Date    Arthritis    CHF (congestive heart failure) (HCC)    Chronic cough    Chronic insomnia 03/07/2015   Diverticulitis    GERD (gastroesophageal reflux disease)    Gout    Hiatal hernia    Hypertension    LBBB (left bundle branch block)  Pacemaker    Medtronic due to heart block   PAD (peripheral artery disease) (HCC) 11/03/2022   ABIs/arterial Dopplers 12/03/2022: Bilateral EIA >50, mild atherosclerosis throughout // ABIs-right 0.97, left 0.62   Panic disorder 09/07/2014   Pneumonia    x 1 - Walking   Seizures (HCC)    Last Seizure in 2017 - controlled with meds    Sleep apnea    does not use a CPAP due to grand mal seizure   Wrist tendonitis    R     PAST SURGICAL HISTORY: Past Surgical History:  Procedure Laterality Date   ABDOMINAL AORTOGRAM W/LOWER EXTREMITY N/A 01/07/2023   Procedure: ABDOMINAL AORTOGRAM W/LOWER EXTREMITY;  Surgeon: Runell Gess, MD;  Location: [facility] INVASIVE CV LAB;  Service: Cardiovascular;  Laterality: N/A;   COLONOSCOPY  last 03/31/2016   COLONOSCOPY WITH PROPOFOL N/A 04/04/2019   Procedure: COLONOSCOPY WITH PROPOFOL;  Surgeon: Napoleon Form, MD;  Location: [facility] ENDOSCOPY;  Service: Endoscopy;  Laterality: N/A;   COLONOSCOPY WITH PROPOFOL N/A 06/15/2019   Procedure: COLONOSCOPY WITH PROPOFOL;  Surgeon: Meridee Score Netty Starring., MD;  Location: [facility] ENDOSCOPY;  Service: Gastroenterology;  Laterality: N/A;   COLONOSCOPY WITH PROPOFOL N/A 02/28/2020   Procedure: COLONOSCOPY WITH PROPOFOL;  Surgeon: Meridee Score Netty Starring., MD;  Location: [facility] ENDOSCOPY;  Service: Gastroenterology;  Laterality: N/A;   COLONOSCOPY WITH PROPOFOL N/A 06/11/2022   Procedure: COLONOSCOPY WITH PROPOFOL;  Surgeon: Meridee Score Netty Starring., MD;  Location: [facility] ENDOSCOPY;  Service: Gastroenterology;  Laterality: N/A;   ENDOROTOR  02/28/2020   Procedure: [id number];  Surgeon: Mansouraty, Netty Starring., MD;  Location: [facility] ENDOSCOPY;  Service: Gastroenterology;;   ENDOSCOPIC MUCOSAL RESECTION  N/A 06/15/2019   Procedure: ENDOSCOPIC MUCOSAL RESECTION;  Surgeon: Lemar Lofty., MD;  Location: [facility] ENDOSCOPY;  Service: Gastroenterology;  Laterality: N/A;   ENDOSCOPIC MUCOSAL RESECTION N/A 02/28/2020   Procedure: ENDOSCOPIC MUCOSAL RESECTION;  Surgeon: Meridee Score Netty Starring., MD;  Location: [facility] ENDOSCOPY;  Service: Gastroenterology;  Laterality: N/A;   ENDOSCOPIC MUCOSAL RESECTION N/A 06/11/2022   Procedure: ENDOSCOPIC MUCOSAL RESECTION;  Surgeon: Meridee Score Netty Starring., MD;  Location: [facility] ENDOSCOPY;  Service: Gastroenterology;  Laterality: N/A;   EP IMPLANTABLE DEVICE N/A 12/13/2014   Procedure: Pacemaker Implant;  Surgeon: Duke Salvia, MD;  Location: [facility] INVASIVE CV LAB;  Service: Cardiovascular;  Laterality: N/A;   HAND SURGERY     HEMOSTASIS CLIP PLACEMENT  06/15/2019   Procedure: HEMOSTASIS CLIP PLACEMENT;  Surgeon: Lemar Lofty., MD;  Location: [facility] ENDOSCOPY;  Service: Gastroenterology;;   HEMOSTASIS CLIP PLACEMENT  02/28/2020   Procedure: HEMOSTASIS CLIP PLACEMENT;  Surgeon: Lemar Lofty., MD;  Location: [facility] ENDOSCOPY;  Service: Gastroenterology;;   HEMOSTASIS CLIP PLACEMENT  06/11/2022   Procedure: HEMOSTASIS CLIP PLACEMENT;  Surgeon: Lemar Lofty., MD;  Location: [facility] ENDOSCOPY;  Service: Gastroenterology;;   HEMOSTASIS CONTROL  02/28/2020   Procedure: HEMOSTASIS CONTROL;  Surgeon: Lemar Lofty., MD;  Location: [facility] ENDOSCOPY;  Service: Gastroenterology;;   POLYPECTOMY     POLYPECTOMY  04/04/2019   Procedure: POLYPECTOMY;  Surgeon: Napoleon Form, MD;  Location: [facility] ENDOSCOPY;  Service: Endoscopy;;   POLYPECTOMY  06/15/2019   Procedure: POLYPECTOMY;  Surgeon: Lemar Lofty., MD;  Location: [facility] ENDOSCOPY;  Service: Gastroenterology;;   POLYPECTOMY  06/11/2022   Procedure: POLYPECTOMY;  Surgeon: Lemar Lofty., MD;  Location: [facility] ENDOSCOPY;  Service: Gastroenterology;;   SKIN CANCER EXCISION     SUBMUCOSAL LIFTING  INJECTION  06/15/2019   Procedure: SUBMUCOSAL LIFTING INJECTION;  Surgeon:  Mansouraty, Netty Starring., MD;  Location: [facility] ENDOSCOPY;  Service: Gastroenterology;;   Sunnie Nielsen LIFTING INJECTION  06/11/2022   Procedure: SUBMUCOSAL LIFTING INJECTION;  Surgeon: Lemar Lofty., MD;  Location: [facility] ENDOSCOPY;  Service: Gastroenterology;;   TUBAL LIGATION     UPPER GI ENDOSCOPY     WISDOM TOOTH EXTRACTION      FAMILY HISTORY: Family History  Problem Relation Age of Onset   Allergic rhinitis Mother    Alzheimer's disease Mother    Hypertension Mother    Colon polyps Mother    Allergic rhinitis Father    Hypertension Father    Stroke Father    Leukemia Father    Prostate cancer Father    Colon polyps Father    Allergic rhinitis Sister    Colon polyps Sister    Allergic rhinitis Sister    Kidney failure Sister    Diabetes Sister    Colon polyps Sister    Allergic rhinitis Brother    Stroke Brother    Hypertension Brother    Sleep apnea Brother    Colon polyps Brother    Colon cancer Paternal Aunt    Colon cancer Paternal Uncle    Parkinson's disease Maternal Grandmother    Coronary artery disease Maternal Grandfather    Esophageal cancer Neg Hx    Stomach cancer Neg Hx    Rectal cancer Neg Hx    Inflammatory bowel disease Neg Hx    Liver disease Neg Hx    Pancreatic cancer Neg Hx    Seizures Neg Hx     SOCIAL HISTORY: Social History   Socioeconomic History   Marital status: Single    Spouse name: Not on file   Number of children: 0   Years of education: 14   Highest education level: Not on file  Occupational History   Not on file  Tobacco Use   Smoking status: Some Days    Current packs/day: 0.25    Average packs/day: 0.2 packs/day for 47.7 years (11.9 ttl pk-yrs)    Types: Cigarettes    Start date: 1977    Passive exposure: Current   Smokeless tobacco: Never   Tobacco comments:    Pt states she smokes 2 cigs a day and sometime 4 a week    01/27/2023 patient  states she smoke 3 cigarettes on the days that she does smoke  Vaping Use   Vaping status: Never Used  Substance and Sexual Activity   Alcohol use: Not Currently    Comment: Quit 12/07/2015   Drug use: Never   Sexual activity: Not Currently    Birth control/protection: Post-menopausal  Other Topics Concern   Not on file  Social History Narrative   Lives with sister   Left handed   Drinks 1-2 cups caffeine daily   Social Determinants of Health   Financial Resource Strain: Not on file  Food Insecurity: No Food Insecurity (01/07/2023)   Hunger Vital Sign    Worried About Running Out of Food in the Last Year: Never true    Ran Out of Food in the Last Year: Never true  Transportation Needs: No Transportation Needs (01/07/2023)   PRAPARE - Administrator, Civil Service (Medical): No    Lack of Transportation (Non-Medical): No  Physical Activity: Not on file  Stress: Not on file  Social Connections: Not on file  Intimate Partner Violence: Not At Risk (01/07/2023)   Humiliation, Afraid, Rape, and Kick questionnaire    Fear of Current  or Ex-Partner: No    Emotionally Abused: No    Physically Abused: No    Sexually Abused: No   PHYSICAL EXAM  Vitals:   02/25/23 1243  BP: (!) 145/59  Pulse: 71  Weight: 189 lb 8 oz (86 kg)  Height: 5' 1.5" (1.562 m)   Body mass index is 35.23 kg/m.  Generalized: Well developed, in no acute distress  Neurological examination  Mentation: Alert oriented to time, place, history taking. Follows all commands speech and language fluent Cranial nerve II-XII: Pupils were equal round reactive to light. Extraocular movements were full, visual field were full on confrontational test. Facial sensation and strength were normal. Head turning and shoulder shrug  were normal and symmetric. Motor: The motor testing reveals 5 over 5 strength of all 4 extremities. Good symmetric motor tone is noted throughout.  Sensory: Sensory testing is intact to soft  touch on all 4 extremities. No evidence of extinction is noted.  Coordination: Cerebellar testing reveals good finger-nose-finger and heel-to-shin bilaterally.  Gait and station: Gait is normal, independent Reflexes: Deep tendon reflexes are symmetric and normal bilaterally.   DIAGNOSTIC DATA (LABS, IMAGING, TESTING) - I reviewed patient records, labs, notes, testing and imaging myself where available.  Lab Results  Component Value Date   WBC 9.7 01/08/2023   HGB 12.3 01/08/2023   HCT 38.1 01/08/2023   MCV 96.7 01/08/2023   PLT 271 01/08/2023      Component Value Date/Time   NA 139 01/08/2023 0031   NA 140 01/05/2023 1506   K 4.0 01/08/2023 0031   CL 106 01/08/2023 0031   CO2 23 01/08/2023 0031   GLUCOSE 125 (H) 01/08/2023 0031   BUN 9 01/08/2023 0031   BUN 9 01/05/2023 1506   CREATININE 1.42 (H) 01/08/2023 0031   CALCIUM 9.0 01/08/2023 0031   PROT 6.7 07/28/2022 0920   ALBUMIN 4.4 07/28/2022 0920   AST 15 07/28/2022 0920   ALT 17 07/28/2022 0920   ALKPHOS 167 (H) 07/28/2022 0920   BILITOT 0.5 07/28/2022 0920   GFRNONAA 42 (L) 01/08/2023 0031   GFRAA 53 (L) 07/29/2020 0902   Lab Results  Component Value Date   CHOL 140 01/08/2023   HDL 36 (L) 01/08/2023   LDLCALC 68 01/08/2023   LDLDIRECT 199.0 03/02/2016   TRIG 180 (H) 01/08/2023   CHOLHDL 3.9 01/08/2023   Lab Results  Component Value Date   HGBA1C 4.9 06/19/2020   Lab Results  Component Value Date   VITAMINB12 177 (L) 04/11/2015   Lab Results  Component Value Date   TSH 1.399 12/12/2014    Margie Ege, AGNP-C, DNP 02/25/2023, 12:47 PM [facility] [address], [address] [place], [place] [address] [phone]

## 2023-02-25 NOTE — Telephone Encounter (Signed)
Reviewed patients chart, procedure will need to be deferred for 6 months per Dr. Allyson Sabal for recent left common iliac artery stenting. Will remove clearance request from Pre-operative pool.

## 2023-02-25 NOTE — Patient Instructions (Addendum)
Increase Lamictal to 250 mg twice daily for seizures, continue Trileptal 300 mg twice daily. Check labs today, call for recurrent seizures.   Meds ordered this encounter  Medications   lamoTRIgine (LAMICTAL) 25 MG tablet    Sig: Take 2 tablets (50 mg total) by mouth 2 (two) times daily.    Dispense:  360 tablet    Refill:  3    To be taken with the 200 mg tablet twice daily   lamoTRIgine (LAMICTAL) 200 MG tablet    Sig: Take 1 tablet (200 mg total) by mouth 2 (two) times daily.    Dispense:  180 tablet    Refill:  3   Oxcarbazepine (TRILEPTAL) 300 MG tablet    Sig: Take 1 tablet (300 mg total) by mouth 2 (two) times daily.    Dispense:  180 tablet    Refill:  3

## 2023-02-25 NOTE — Telephone Encounter (Addendum)
Left message on machine to call back  Case has been cancelled.

## 2023-02-26 NOTE — Telephone Encounter (Signed)
I have spoken to the pt and appt has been cancelled  Recall entered to be done in 6 months  FYI Dr Meridee Score

## 2023-02-26 NOTE — Telephone Encounter (Signed)
Thanks Patty for the update. GM

## 2023-03-01 LAB — COMPREHENSIVE METABOLIC PANEL
ALT: 11 IU/L (ref 0–32)
AST: 12 IU/L (ref 0–40)
Albumin: 4.2 g/dL (ref 3.9–4.9)
Alkaline Phosphatase: 154 IU/L — ABNORMAL HIGH (ref 44–121)
BUN/Creatinine Ratio: 6 — ABNORMAL LOW (ref 12–28)
BUN: 8 mg/dL (ref 8–27)
Bilirubin Total: 0.3 mg/dL (ref 0.0–1.2)
CO2: 21 mmol/L (ref 20–29)
Calcium: 8.9 mg/dL (ref 8.7–10.3)
Chloride: 106 mmol/L (ref 96–106)
Creatinine, Ser: 1.3 mg/dL — ABNORMAL HIGH (ref 0.57–1.00)
Globulin, Total: 2.6 g/dL (ref 1.5–4.5)
Glucose: 94 mg/dL (ref 70–99)
Potassium: 3.8 mmol/L (ref 3.5–5.2)
Sodium: 143 mmol/L (ref 134–144)
Total Protein: 6.8 g/dL (ref 6.0–8.5)
eGFR: 46 mL/min/{1.73_m2} — ABNORMAL LOW (ref >59)

## 2023-03-01 LAB — CBC WITH DIFFERENTIAL/PLATELET
Basophils Absolute: 0 10*3/uL (ref 0.0–0.2)
Basos: 1 %
EOS (ABSOLUTE): 0.2 10*3/uL (ref 0.0–0.4)
Eos: 2 %
Hematocrit: 40.5 % (ref 34.0–46.6)
Hemoglobin: 13.1 g/dL (ref 11.1–15.9)
Immature Grans (Abs): 0 10*3/uL (ref 0.0–0.1)
Immature Granulocytes: 0 %
Lymphocytes Absolute: 2.6 10*3/uL (ref 0.7–3.1)
Lymphs: 31 %
MCH: 31 pg (ref 26.6–33.0)
MCHC: 32.3 g/dL (ref 31.5–35.7)
MCV: 96 fL (ref 79–97)
Monocytes Absolute: 0.5 10*3/uL (ref 0.1–0.9)
Monocytes: 6 %
Neutrophils Absolute: 5 10*3/uL (ref 1.4–7.0)
Neutrophils: 60 %
Platelets: 265 10*3/uL (ref 150–450)
RBC: 4.23 x10E6/uL (ref 3.77–5.28)
RDW: 12.9 % (ref 11.7–15.4)
WBC: 8.4 10*3/uL (ref 3.4–10.8)

## 2023-03-01 LAB — LAMOTRIGINE LEVEL: Lamotrigine Lvl: 11 ug/mL (ref 2.0–20.0)

## 2023-03-01 LAB — 10-HYDROXYCARBAZEPINE: Oxcarbazepine SerPl-Mcnc: 16 ug/mL (ref 10–35)

## 2023-03-15 ENCOUNTER — Ambulatory Visit (HOSPITAL_COMMUNITY): Payer: Medicare HMO | Attending: Physician Assistant

## 2023-03-18 ENCOUNTER — Ambulatory Visit (HOSPITAL_COMMUNITY): Payer: Medicare HMO | Attending: Physician Assistant

## 2023-03-18 DIAGNOSIS — I5022 Chronic systolic (congestive) heart failure: Secondary | ICD-10-CM | POA: Diagnosis not present

## 2023-03-18 LAB — ECHOCARDIOGRAM COMPLETE
Area-P 1/2: 3.26 cm2
S' Lateral: 2.5 cm

## 2023-03-22 NOTE — Progress Notes (Signed)
Pt has been made aware of normal result and verbalized understanding.  jw

## 2023-04-01 ENCOUNTER — Ambulatory Visit (INDEPENDENT_AMBULATORY_CARE_PROVIDER_SITE_OTHER): Payer: Medicare HMO

## 2023-04-01 DIAGNOSIS — N390 Urinary tract infection, site not specified: Secondary | ICD-10-CM | POA: Diagnosis not present

## 2023-04-01 DIAGNOSIS — I428 Other cardiomyopathies: Secondary | ICD-10-CM | POA: Diagnosis not present

## 2023-04-01 DIAGNOSIS — K219 Gastro-esophageal reflux disease without esophagitis: Secondary | ICD-10-CM | POA: Diagnosis not present

## 2023-04-01 LAB — CUP PACEART REMOTE DEVICE CHECK
Battery Remaining Longevity: 34 mo
Battery Voltage: 2.95 V
Brady Statistic AP VP Percent: 36.05 %
Brady Statistic AP VS Percent: 0 %
Brady Statistic AS VP Percent: 63.95 %
Brady Statistic AS VS Percent: 0 %
Brady Statistic RA Percent Paced: 36.04 %
Brady Statistic RV Percent Paced: 100 %
Date Time Interrogation Session: 20241024142618
Implantable Lead Connection Status: 753985
Implantable Lead Connection Status: 753985
Implantable Lead Implant Date: 20160707
Implantable Lead Implant Date: 20160707
Implantable Lead Location: 753859
Implantable Lead Location: 753860
Implantable Lead Model: 5076
Implantable Lead Model: 5076
Implantable Pulse Generator Implant Date: 20160707
Lead Channel Impedance Value: 342 Ohm
Lead Channel Impedance Value: 361 Ohm
Lead Channel Impedance Value: 684 Ohm
Lead Channel Impedance Value: 703 Ohm
Lead Channel Pacing Threshold Amplitude: 0.5 V
Lead Channel Pacing Threshold Amplitude: 0.5 V
Lead Channel Pacing Threshold Pulse Width: 0.4 ms
Lead Channel Pacing Threshold Pulse Width: 0.4 ms
Lead Channel Sensing Intrinsic Amplitude: 27.5 mV
Lead Channel Sensing Intrinsic Amplitude: 27.5 mV
Lead Channel Sensing Intrinsic Amplitude: 3.125 mV
Lead Channel Sensing Intrinsic Amplitude: 3.125 mV
Lead Channel Setting Pacing Amplitude: 1.5 V
Lead Channel Setting Pacing Amplitude: 2.5 V
Lead Channel Setting Pacing Pulse Width: 0.4 ms
Lead Channel Setting Sensing Sensitivity: 1.2 mV
Zone Setting Status: 755011
Zone Setting Status: 755011

## 2023-04-08 ENCOUNTER — Encounter (HOSPITAL_COMMUNITY): Payer: Self-pay

## 2023-04-08 ENCOUNTER — Ambulatory Visit (HOSPITAL_COMMUNITY): Admit: 2023-04-08 | Payer: Medicare HMO | Admitting: Gastroenterology

## 2023-04-08 SURGERY — COLONOSCOPY WITH PROPOFOL
Anesthesia: Monitor Anesthesia Care

## 2023-04-20 NOTE — Progress Notes (Signed)
Remote pacemaker transmission.   

## 2023-05-15 ENCOUNTER — Other Ambulatory Visit: Payer: Self-pay | Admitting: Physician Assistant

## 2023-05-17 ENCOUNTER — Other Ambulatory Visit: Payer: Self-pay

## 2023-05-17 MED ORDER — CARVEDILOL 3.125 MG PO TABS: 3.1250 mg | ORAL_TABLET | Freq: Two times a day (BID) | ORAL | 2 refills | Status: DC

## 2023-05-19 ENCOUNTER — Ambulatory Visit: Payer: Medicare HMO | Admitting: Neurology

## 2023-05-25 ENCOUNTER — Other Ambulatory Visit (HOSPITAL_BASED_OUTPATIENT_CLINIC_OR_DEPARTMENT_OTHER): Payer: Self-pay | Admitting: Cardiovascular Disease

## 2023-05-25 DIAGNOSIS — I739 Peripheral vascular disease, unspecified: Secondary | ICD-10-CM

## 2023-06-15 ENCOUNTER — Other Ambulatory Visit: Payer: Self-pay | Admitting: Physician Assistant

## 2023-06-16 DIAGNOSIS — G4733 Obstructive sleep apnea (adult) (pediatric): Secondary | ICD-10-CM | POA: Diagnosis not present

## 2023-06-17 DIAGNOSIS — J208 Acute bronchitis due to other specified organisms: Secondary | ICD-10-CM | POA: Diagnosis not present

## 2023-07-01 ENCOUNTER — Other Ambulatory Visit: Payer: Self-pay | Admitting: Physician Assistant

## 2023-07-01 ENCOUNTER — Ambulatory Visit (INDEPENDENT_AMBULATORY_CARE_PROVIDER_SITE_OTHER): Payer: Medicare HMO

## 2023-07-01 DIAGNOSIS — I428 Other cardiomyopathies: Secondary | ICD-10-CM

## 2023-07-03 LAB — CUP PACEART REMOTE DEVICE CHECK
Battery Remaining Longevity: 33 mo
Battery Voltage: 2.95 V
Brady Statistic AP VP Percent: 18.64 %
Brady Statistic AP VS Percent: 0 %
Brady Statistic AS VP Percent: 81.36 %
Brady Statistic AS VS Percent: 0 %
Brady Statistic RA Percent Paced: 18.63 %
Brady Statistic RV Percent Paced: 100 %
Date Time Interrogation Session: 20250124162350
Implantable Lead Connection Status: 753985
Implantable Lead Connection Status: 753985
Implantable Lead Implant Date: 20160707
Implantable Lead Implant Date: 20160707
Implantable Lead Location: 753859
Implantable Lead Location: 753860
Implantable Lead Model: 5076
Implantable Lead Model: 5076
Implantable Pulse Generator Implant Date: 20160707
Lead Channel Impedance Value: 380 Ohm
Lead Channel Impedance Value: 380 Ohm
Lead Channel Impedance Value: 760 Ohm
Lead Channel Impedance Value: 779 Ohm
Lead Channel Pacing Threshold Amplitude: 0.5 V
Lead Channel Pacing Threshold Amplitude: 0.5 V
Lead Channel Pacing Threshold Pulse Width: 0.4 ms
Lead Channel Pacing Threshold Pulse Width: 0.4 ms
Lead Channel Sensing Intrinsic Amplitude: 27.5 mV
Lead Channel Sensing Intrinsic Amplitude: 27.5 mV
Lead Channel Sensing Intrinsic Amplitude: 3.625 mV
Lead Channel Sensing Intrinsic Amplitude: 3.625 mV
Lead Channel Setting Pacing Amplitude: 1.5 V
Lead Channel Setting Pacing Amplitude: 2.5 V
Lead Channel Setting Pacing Pulse Width: 0.4 ms
Lead Channel Setting Sensing Sensitivity: 1.2 mV
Zone Setting Status: 755011
Zone Setting Status: 755011

## 2023-07-06 ENCOUNTER — Telehealth: Payer: Self-pay

## 2023-07-06 ENCOUNTER — Encounter: Payer: Self-pay | Admitting: Neurology

## 2023-07-06 NOTE — Telephone Encounter (Signed)
Call to patient in regards to her mychart message stating she has seizure last night. She reports sitting in her chair and feeling shaking on the inside. She stated it lasted about 2-3 minutes. She said she just sat back in the chair and closed her eyes and the feeling passed. She denies and convulsions or loss of consciousness. She reports medications compliance but states she has had severe bronchitis for several weeks and is being treated for that. She is fine with a my chart response with recommendations.

## 2023-07-06 NOTE — Telephone Encounter (Signed)
Noted. Not clear this sounds like true seizure event. Recommend monitor, medication compliance. Ensure recovery from severe bronchitis. Thanks

## 2023-07-07 ENCOUNTER — Other Ambulatory Visit: Payer: Self-pay | Admitting: Anesthesiology

## 2023-07-07 DIAGNOSIS — R569 Unspecified convulsions: Secondary | ICD-10-CM

## 2023-07-12 ENCOUNTER — Telehealth: Payer: Self-pay | Admitting: Neurology

## 2023-07-12 DIAGNOSIS — R569 Unspecified convulsions: Secondary | ICD-10-CM

## 2023-07-12 MED ORDER — OXCARBAZEPINE 300 MG PO TABS: 300.0000 mg | ORAL_TABLET | Freq: Two times a day (BID) | ORAL | 3 refills | Status: DC

## 2023-07-12 NOTE — Telephone Encounter (Signed)
refiiled 

## 2023-07-12 NOTE — Telephone Encounter (Signed)
Pt request refill for Oxcarbazepine (TRILEPTAL) 300 MG tablet send to [id number]

## 2023-07-17 DIAGNOSIS — G4733 Obstructive sleep apnea (adult) (pediatric): Secondary | ICD-10-CM | POA: Diagnosis not present

## 2023-07-30 ENCOUNTER — Ambulatory Visit (HOSPITAL_COMMUNITY): Payer: Medicare HMO

## 2023-08-04 ENCOUNTER — Encounter: Payer: Self-pay | Admitting: Internal Medicine

## 2023-08-05 DIAGNOSIS — F419 Anxiety disorder, unspecified: Secondary | ICD-10-CM | POA: Diagnosis not present

## 2023-08-05 DIAGNOSIS — K219 Gastro-esophageal reflux disease without esophagitis: Secondary | ICD-10-CM | POA: Diagnosis not present

## 2023-08-05 DIAGNOSIS — Z7902 Long term (current) use of antithrombotics/antiplatelets: Secondary | ICD-10-CM | POA: Diagnosis not present

## 2023-08-05 DIAGNOSIS — Z8249 Family history of ischemic heart disease and other diseases of the circulatory system: Secondary | ICD-10-CM | POA: Diagnosis not present

## 2023-08-05 DIAGNOSIS — D6869 Other thrombophilia: Secondary | ICD-10-CM | POA: Diagnosis not present

## 2023-08-05 DIAGNOSIS — J301 Allergic rhinitis due to pollen: Secondary | ICD-10-CM | POA: Diagnosis not present

## 2023-08-05 DIAGNOSIS — I110 Hypertensive heart disease with heart failure: Secondary | ICD-10-CM | POA: Diagnosis not present

## 2023-08-05 DIAGNOSIS — I2510 Atherosclerotic heart disease of native coronary artery without angina pectoris: Secondary | ICD-10-CM | POA: Diagnosis not present

## 2023-08-05 DIAGNOSIS — I4891 Unspecified atrial fibrillation: Secondary | ICD-10-CM | POA: Diagnosis not present

## 2023-08-05 DIAGNOSIS — G40909 Epilepsy, unspecified, not intractable, without status epilepticus: Secondary | ICD-10-CM | POA: Diagnosis not present

## 2023-08-05 DIAGNOSIS — Z7984 Long term (current) use of oral hypoglycemic drugs: Secondary | ICD-10-CM | POA: Diagnosis not present

## 2023-08-05 DIAGNOSIS — I509 Heart failure, unspecified: Secondary | ICD-10-CM | POA: Diagnosis not present

## 2023-08-12 NOTE — Progress Notes (Signed)
 Remote pacemaker transmission.

## 2023-08-12 NOTE — Addendum Note (Signed)
 Addended by: Elease Etienne A on: 08/12/2023 02:44 PM   Modules accepted: Orders

## 2023-08-14 DIAGNOSIS — G4733 Obstructive sleep apnea (adult) (pediatric): Secondary | ICD-10-CM | POA: Diagnosis not present

## 2023-08-25 ENCOUNTER — Ambulatory Visit (HOSPITAL_COMMUNITY): Admission: RE | Admit: 2023-08-25 | Payer: Medicare HMO | Source: Ambulatory Visit

## 2023-08-25 ENCOUNTER — Encounter (HOSPITAL_COMMUNITY): Payer: Self-pay

## 2023-09-06 ENCOUNTER — Ambulatory Visit: Payer: Medicare HMO | Admitting: Internal Medicine

## 2023-09-07 NOTE — Progress Notes (Deleted)
 PATIENT: [name] [name] DOB: [birth year]  REASON FOR VISIT: follow up HISTORY FROM: patient Primary Neurologist: Dr. Dalia Heading. [place]   ASSESSMENT AND PLAN 63 y.o. year old female  has a past medical history of Arthritis, CHF (congestive heart failure) (HCC), Chronic cough, Chronic insomnia (03/07/2015), Diverticulitis, GERD (gastroesophageal reflux disease), Gout, Hiatal hernia, Hypertension, LBBB (left bundle branch block), Pacemaker, PAD (peripheral artery disease) (HCC) (11/03/2022), Panic disorder (09/07/2014), Pneumonia, Seizures (HCC), Sleep apnea, and Wrist tendonitis. here with:  1.  History of seizures, with recent occurrence   -Describes 2 recent spells of seizure-like activity -Increase Lamictal 250 mg BID, continue Trileptal 300 mg twice daily -EEG was normal in February 2024 -Check routine labs today -If seizures continue, consider prolonged ambulatory EEG -Historically, has had difficulty tolerating seizure medication has previously taken Keppra, Zonegran with side effects, higher doses of Trileptal resulted in gait instability -Follow-up in 6 months or sooner if needed, call for seizures  No orders of the defined types were placed in this encounter.  HISTORY OF PRESENT ILLNESS: Today 09/07/23   07/06/23 SS: EEG was normal February 2024. Remains on Trileptal 300 mg BID, Lamictal 200/250. Reports 3 weeks ago, saw stars in her vision for 30 seconds, felt triggered by her sisters arguing, again 2 months ago. No loss of consciousness, felt tired when she stood. For last few years has been typical spell, usually triggered during stress. Doesn't drive.   Update 07/28/22 SS: Here with her sister to discuss recent seizure activity. About 3 weeks ago standing in line at drug store, vision dimmed with tunnel vision, leaned against counter, staff helped her, she came around was still about to function talk. Few days ago, fell over the side of her recliner, few seconds was out  of it. Was a little confused. Another time, got up from chair, leaned backwards, like frozen, pushing the chair back. Remains on Lamictal 200/250 mg daily, Trileptal 300 mg twice daily, no missed doses. Taking 2 weeks of prednisone taper (2 pills daily, then 1 pill the 2nd week) when seizures started. Yesterday under a lot of stress, had no events. When on prednisone, felt couldn't walk straight. Was on for shin splints.  Doesn't drive a car. Has been placed on gabapentin up to 300 mg TID, has shingles, present for 2 weeks. They think prednisone caused seizure events.   -March 2015 EEG showed brief episodes of theta slowing from the left temporal region -Video monitoring EEG at [facility], she didn't have any seizure events -[facility] placed [facility] on Zonegran, but had anxiety and diarrhea, Lamictal was started in 2016 -MRI of the brain has question the possibility of mesial temporal sclerosis on the right -Pacemaker placed in 2016 due to complete heart block, question if this may be the etiology of seizure type events? -EEG during hospitalization January 2022 was normal -CT head November 2023 showed generalized cerebral atrophy microvascular disease  Update 05/20/2022 SS: Here today alone, her sister drove her. Has been having BP issues, in the ER 05/07/22, med adjustment, seems to be helping. Takes Trileptal 300 mg BID, Lamictal 200/250 mg. Under some stress with another sister moving in, during an argument may have had seizure staring off for 1 minute. Still can have gait instability. 1 fall when leaning forward.   Update 05/20/21 SS: [name] [name] here today for follow-up of history of seizure disorder.  Remains on Trileptal and Lamictal.  Last seizure was in January 2022 when she was significantly ill. At last visit in Jan  2022 Lamictal level was 10.7, Trileptal was 17. No seizures of recent. Still lives with sister, doesn't drive a car. Other than lingering cough, no health issues. If she turns a corner,  she can veer off, this is not new. There have been no falls. Here today alone. In the past the dosage of Trileptal has been reduced to help with gait stability without change, the dose was increased back. Has pacemaker.   Update 11/11/2020 SS: [name] [name] is a 63 year old female with history of seizure disorder.  Last seizure was in January 2022, when she had significant illness requiring intubation with hypoxia and elevated blood glucose.  She is on Trileptal and Lamictal.  No further seizure events. Both medications were therapeutic at last office visit.  She is living with her sister.  Is currently not driving a car.  Has a pacemaker, sees cardiology. Claims has been doing well. At times feels dizzy, no falls.  Here today for evaluation unaccompanied.  HISTORY 07/08/2020 Dr. Anne Hahn: Ms. Kennington is a 63 year old left-handed white female with a history of a seizure disorder.  The patient has been treated with Trileptal and lamotrigine, she has not had a seizure since December 2019.  Unfortunately, the patient developed a gradual onset of shortness of breath several days prior to a June 19, 2020 admission to the hospital.  The patient began having severe shortness of breath on the day of admission, EMS was called and the patient had a seizure prior to going in the hospital.  She was noted to have oxygen saturations in the 60% range at that time, her white blood count was 18.6 when she went in the hospital.  She was felt to have an aspiration pneumonia and required intubation.  During the hospitalization, she was extubated and then had another seizure following extubation and then had to be reintubated again.  The patient never had blood levels of the Trileptal or lamotrigine done on admission.  Her ammonia level is 147 with the upper range of normal being 35.  The patient has since been discharged from the hospital, she remains on her current dose of the lamotrigine taking 200 mg twice daily and 50 mg in  the evening and she is on Trileptal 300 mg twice daily.  Higher levels of Trileptal in the past have resulted in increased gait instability.  The patient has not had any seizures since coming out of the hospital.  She is not operating a motor vehicle.  She reports some gait instability and fatigue and drowsiness at times, she has not had any falls.  She has not had any further seizures coming out of the hospital.  She reports occasional migratory head pains consistent with "ice pick pains".  She denies any numbness or weakness of the extremities.  She has a pacemaker in place, she cannot have MRI.  A CT scan of the brain was done in the hospital and was unremarkable.   REVIEW OF SYSTEMS: Out of a complete 14 system review of symptoms, the patient complains only of the following symptoms, and all other reviewed systems are negative.  See HPI  ALLERGIES: Allergies  Allergen Reactions   Zonisamide Anxiety    Panic Attacks   Aspirin Hives    Upset stomach   Ciprofloxacin     Seizures & it keeps her awake   Codeine Hives   Hydrocodone Hives   Oxycodone Hives   Penicillins Hives and Swelling    Pt tolerated ampicillin/sulbactam without issues 06/2020  HOME MEDICATIONS: Outpatient Medications Prior to Visit  Medication Sig Dispense Refill   acetaminophen (TYLENOL) 500 MG tablet Take 500-1,000 mg by mouth every 6 (six) hours as needed for moderate pain (pain).      albuterol (VENTOLIN HFA) 108 (90 Base) MCG/ACT inhaler Inhale 2 puffs into the lungs every 6 (six) hours as needed for wheezing or shortness of breath. 8 g 11   amLODipine (NORVASC) 5 MG tablet Take 5 mg by mouth daily.     aspirin EC 81 MG tablet Take 1 tablet (81 mg total) by mouth daily. Swallow whole.     atorvastatin (LIPITOR) 20 MG tablet Take 20 mg by mouth daily.     busPIRone (BUSPAR) 10 MG tablet Take 10 mg by mouth 2 (two) times daily.     carvedilol (COREG) 3.125 MG tablet Take 1 tablet (3.125 mg total) by mouth 2 (two)  times daily with a meal. 180 tablet 2   Cholecalciferol (VITAMIN D3) 2000 units capsule Take 1 capsule (2,000 Units total) by mouth daily. 90 capsule 0   clopidogrel (PLAVIX) 75 MG tablet TAKE 1 TABLET(75 MG) BY MOUTH DAILY WITH BREAKFAST 90 tablet 2   dapagliflozin propanediol (FARXIGA) 10 MG TABS tablet Take 1 tablet (10 mg total) by mouth daily before breakfast. 90 tablet 3   escitalopram (LEXAPRO) 20 MG tablet Take 1 tablet (20 mg total) by mouth daily. 30 tablet 0   fluticasone (FLONASE) 50 MCG/ACT nasal spray Place 1-2 sprays into both nostrils daily as needed for allergies or rhinitis.     lamoTRIgine (LAMICTAL) 200 MG tablet Take 1 tablet (200 mg total) by mouth 2 (two) times daily. 180 tablet 3   lamoTRIgine (LAMICTAL) 25 MG tablet Take 2 tablets (50 mg total) by mouth 2 (two) times daily. 360 tablet 3   montelukast (SINGULAIR) 10 MG tablet Take 10 mg by mouth at bedtime.     Oxcarbazepine (TRILEPTAL) 300 MG tablet Take 1 tablet (300 mg total) by mouth 2 (two) times daily. 180 tablet 3   pantoprazole (PROTONIX) 40 MG tablet Take 1 tablet by mouth daily.     Spacer/Aero-Holding Chambers DEVI 2 Inhalers by Does not apply route in the morning and at bedtime. 1 each 1   spironolactone (ALDACTONE) 25 MG tablet TAKE 1/2 TABLET(12.5 MG) BY MOUTH DAILY 45 tablet 2   valsartan (DIOVAN) 160 MG tablet Take 320 mg by mouth daily.     No facility-administered medications prior to visit.    PAST MEDICAL HISTORY: Past Medical History:  Diagnosis Date   Arthritis    CHF (congestive heart failure) (HCC)    Chronic cough    Chronic insomnia 03/07/2015   Diverticulitis    GERD (gastroesophageal reflux disease)    Gout    Hiatal hernia    Hypertension    LBBB (left bundle branch block)    Pacemaker    Medtronic due to heart block   PAD (peripheral artery disease) (HCC) 11/03/2022   ABIs/arterial Dopplers 12/03/2022: Bilateral EIA >50, mild atherosclerosis throughout // ABIs-right 0.97, left  0.62   Panic disorder 09/07/2014   Pneumonia    x 1 - Walking   Seizures (HCC)    Last Seizure in 2017 - controlled with meds    Sleep apnea    does not use a CPAP due to grand mal seizure   Wrist tendonitis    R     PAST SURGICAL HISTORY: Past Surgical History:  Procedure Laterality Date   ABDOMINAL AORTOGRAM  W/LOWER EXTREMITY N/A 01/07/2023   Procedure: ABDOMINAL AORTOGRAM W/LOWER EXTREMITY;  Surgeon: Runell Gess, MD;  Location: [facility] INVASIVE CV LAB;  Service: Cardiovascular;  Laterality: N/A;   COLONOSCOPY  last 03/31/2016   COLONOSCOPY WITH PROPOFOL N/A 04/04/2019   Procedure: COLONOSCOPY WITH PROPOFOL;  Surgeon: Napoleon Form, MD;  Location: [facility] ENDOSCOPY;  Service: Endoscopy;  Laterality: N/A;   COLONOSCOPY WITH PROPOFOL N/A 06/15/2019   Procedure: COLONOSCOPY WITH PROPOFOL;  Surgeon: Meridee Score Netty Starring., MD;  Location: [facility] ENDOSCOPY;  Service: Gastroenterology;  Laterality: N/A;   COLONOSCOPY WITH PROPOFOL N/A 02/28/2020   Procedure: COLONOSCOPY WITH PROPOFOL;  Surgeon: Meridee Score Netty Starring., MD;  Location: [facility] ENDOSCOPY;  Service: Gastroenterology;  Laterality: N/A;   COLONOSCOPY WITH PROPOFOL N/A 06/11/2022   Procedure: COLONOSCOPY WITH PROPOFOL;  Surgeon: Meridee Score Netty Starring., MD;  Location: [facility] ENDOSCOPY;  Service: Gastroenterology;  Laterality: N/A;   ENDOROTOR  02/28/2020   Procedure: [id number];  Surgeon: Mansouraty, Netty Starring., MD;  Location: [facility] ENDOSCOPY;  Service: Gastroenterology;;   ENDOSCOPIC MUCOSAL RESECTION N/A 06/15/2019   Procedure: ENDOSCOPIC MUCOSAL RESECTION;  Surgeon: Lemar Lofty., MD;  Location: [facility] ENDOSCOPY;  Service: Gastroenterology;  Laterality: N/A;   ENDOSCOPIC MUCOSAL RESECTION N/A 02/28/2020   Procedure: ENDOSCOPIC MUCOSAL RESECTION;  Surgeon: Meridee Score Netty Starring., MD;  Location: [facility] ENDOSCOPY;  Service: Gastroenterology;  Laterality: N/A;   ENDOSCOPIC MUCOSAL RESECTION N/A 06/11/2022   Procedure: ENDOSCOPIC MUCOSAL  RESECTION;  Surgeon: Meridee Score Netty Starring., MD;  Location: [facility] ENDOSCOPY;  Service: Gastroenterology;  Laterality: N/A;   EP IMPLANTABLE DEVICE N/A 12/13/2014   Procedure: Pacemaker Implant;  Surgeon: Duke Salvia, MD;  Location: [facility] INVASIVE CV LAB;  Service: Cardiovascular;  Laterality: N/A;   HAND SURGERY     HEMOSTASIS CLIP PLACEMENT  06/15/2019   Procedure: HEMOSTASIS CLIP PLACEMENT;  Surgeon: Lemar Lofty., MD;  Location: [facility] ENDOSCOPY;  Service: Gastroenterology;;   HEMOSTASIS CLIP PLACEMENT  02/28/2020   Procedure: HEMOSTASIS CLIP PLACEMENT;  Surgeon: Lemar Lofty., MD;  Location: [facility] ENDOSCOPY;  Service: Gastroenterology;;   HEMOSTASIS CLIP PLACEMENT  06/11/2022   Procedure: HEMOSTASIS CLIP PLACEMENT;  Surgeon: Lemar Lofty., MD;  Location: [facility] ENDOSCOPY;  Service: Gastroenterology;;   HEMOSTASIS CONTROL  02/28/2020   Procedure: HEMOSTASIS CONTROL;  Surgeon: Lemar Lofty., MD;  Location: [facility] ENDOSCOPY;  Service: Gastroenterology;;   POLYPECTOMY     POLYPECTOMY  04/04/2019   Procedure: POLYPECTOMY;  Surgeon: Napoleon Form, MD;  Location: [facility] ENDOSCOPY;  Service: Endoscopy;;   POLYPECTOMY  06/15/2019   Procedure: POLYPECTOMY;  Surgeon: Lemar Lofty., MD;  Location: [facility] ENDOSCOPY;  Service: Gastroenterology;;   POLYPECTOMY  06/11/2022   Procedure: POLYPECTOMY;  Surgeon: Lemar Lofty., MD;  Location: [facility] ENDOSCOPY;  Service: Gastroenterology;;   SKIN CANCER EXCISION     SUBMUCOSAL LIFTING INJECTION  06/15/2019   Procedure: SUBMUCOSAL LIFTING INJECTION;  Surgeon: Lemar Lofty., MD;  Location: [facility] ENDOSCOPY;  Service: Gastroenterology;;   SUBMUCOSAL LIFTING INJECTION  06/11/2022   Procedure: SUBMUCOSAL LIFTING INJECTION;  Surgeon: Lemar Lofty., MD;  Location: [facility] ENDOSCOPY;  Service: Gastroenterology;;   TUBAL LIGATION     UPPER GI ENDOSCOPY     WISDOM TOOTH EXTRACTION      FAMILY HISTORY: Family History   Problem Relation Age of Onset   Allergic rhinitis Mother    Alzheimer's disease Mother    Hypertension Mother    Colon polyps Mother    Allergic rhinitis Father    Hypertension Father    Stroke  Father    Leukemia Father    Prostate cancer Father    Colon polyps Father    Allergic rhinitis Sister    Colon polyps Sister    Allergic rhinitis Sister    Kidney failure Sister    Diabetes Sister    Colon polyps Sister    Allergic rhinitis Brother    Stroke Brother    Hypertension Brother    Sleep apnea Brother    Colon polyps Brother    Colon cancer Paternal Aunt    Colon cancer Paternal Uncle    Parkinson's disease Maternal Grandmother    Coronary artery disease Maternal Grandfather    Esophageal cancer Neg Hx    Stomach cancer Neg Hx    Rectal cancer Neg Hx    Inflammatory bowel disease Neg Hx    Liver disease Neg Hx    Pancreatic cancer Neg Hx    Seizures Neg Hx     SOCIAL HISTORY: Social History   Socioeconomic History   Marital status: Single    Spouse name: Not on file   Number of children: 0   Years of education: 14   Highest education level: Not on file  Occupational History   Not on file  Tobacco Use   Smoking status: Some Days    Current packs/day: 0.25    Average packs/day: 0.3 packs/day for 48.2 years (12.1 ttl pk-yrs)    Types: Cigarettes    Start date: 1977    Passive exposure: Current   Smokeless tobacco: Never   Tobacco comments:    Pt states she smokes 2 cigs a day and sometime 4 a week    01/27/2023 patient states she smoke 3 cigarettes on the days that she does smoke  Vaping Use   Vaping status: Never Used  Substance and Sexual Activity   Alcohol use: Not Currently    Comment: Quit 12/07/2015   Drug use: Never   Sexual activity: Not Currently    Birth control/protection: Post-menopausal  Other Topics Concern   Not on file  Social History Narrative   Lives with sister   Left handed   Drinks 1-2 cups caffeine daily   Social Drivers  of Health   Financial Resource Strain: Not on file  Food Insecurity: No Food Insecurity (01/07/2023)   Hunger Vital Sign    Worried About Running Out of Food in the Last Year: Never true    Ran Out of Food in the Last Year: Never true  Transportation Needs: No Transportation Needs (01/07/2023)   PRAPARE - Administrator, Civil Service (Medical): No    Lack of Transportation (Non-Medical): No  Physical Activity: Not on file  Stress: Not on file  Social Connections: Not on file  Intimate Partner Violence: Not At Risk (01/07/2023)   Humiliation, Afraid, Rape, and Kick questionnaire    Fear of Current or Ex-Partner: No    Emotionally Abused: No    Physically Abused: No    Sexually Abused: No   PHYSICAL EXAM  There were no vitals filed for this visit.  There is no height or weight on file to calculate BMI.  Generalized: Well developed, in no acute distress  Neurological examination  Mentation: Alert oriented to time, place, history taking. Follows all commands speech and language fluent Cranial nerve II-XII: Pupils were equal round reactive to light. Extraocular movements were full, visual field were full on confrontational test. Facial sensation and strength were normal. Head turning and shoulder shrug  were  normal and symmetric. Motor: The motor testing reveals 5 over 5 strength of all 4 extremities. Good symmetric motor tone is noted throughout.  Sensory: Sensory testing is intact to soft touch on all 4 extremities. No evidence of extinction is noted.  Coordination: Cerebellar testing reveals good finger-nose-finger and heel-to-shin bilaterally.  Gait and station: Gait is normal, independent Reflexes: Deep tendon reflexes are symmetric and normal bilaterally.   DIAGNOSTIC DATA (LABS, IMAGING, TESTING) - I reviewed patient records, labs, notes, testing and imaging myself where available.  Lab Results  Component Value Date   WBC 8.4 02/25/2023   HGB 13.1 02/25/2023   HCT  40.5 02/25/2023   MCV 96 02/25/2023   PLT 265 02/25/2023      Component Value Date/Time   NA 143 02/25/2023 1307   K 3.8 02/25/2023 1307   CL 106 02/25/2023 1307   CO2 21 02/25/2023 1307   GLUCOSE 94 02/25/2023 1307   GLUCOSE 125 (H) 01/08/2023 0031   BUN 8 02/25/2023 1307   CREATININE 1.30 (H) 02/25/2023 1307   CALCIUM 8.9 02/25/2023 1307   PROT 6.8 02/25/2023 1307   ALBUMIN 4.2 02/25/2023 1307   AST 12 02/25/2023 1307   ALT 11 02/25/2023 1307   ALKPHOS 154 (H) 02/25/2023 1307   BILITOT 0.3 02/25/2023 1307   GFRNONAA 42 (L) 01/08/2023 0031   GFRAA 53 (L) 07/29/2020 0902   Lab Results  Component Value Date   CHOL 140 01/08/2023   HDL 36 (L) 01/08/2023   LDLCALC 68 01/08/2023   LDLDIRECT 199.0 03/02/2016   TRIG 180 (H) 01/08/2023   CHOLHDL 3.9 01/08/2023   Lab Results  Component Value Date   HGBA1C 4.9 06/19/2020   Lab Results  Component Value Date   VITAMINB12 177 (L) 04/11/2015   Lab Results  Component Value Date   TSH 1.399 12/12/2014    Margie Ege, AGNP-C, DNP 09/07/2023, 9:40 PM [facility] [address] [place], [place] [address] [phone]

## 2023-09-08 ENCOUNTER — Ambulatory Visit: Payer: Medicare HMO | Admitting: Neurology

## 2023-09-08 NOTE — Progress Notes (Unsigned)
 PATIENT: [name] [name] DOB: Oct 20, 1960  REASON FOR VISIT: follow up HISTORY FROM: patient Primary Neurologist: Dr. Dalia Heading. [place]   ASSESSMENT AND PLAN 64 y.o. year old female  has a past medical history of Arthritis, CHF (congestive heart failure) (HCC), Chronic cough, Chronic insomnia (03/07/2015), Diverticulitis, GERD (gastroesophageal reflux disease), Gout, Hiatal hernia, Hypertension, LBBB (left bundle branch block), Pacemaker, PAD (peripheral artery disease) (HCC) (11/03/2022), Panic disorder (09/07/2014), Pneumonia, Seizures (HCC), Sleep apnea, and Wrist tendonitis. here with:  1.  History of seizures, with recent occurrence   -Describes 2 recent spells of seizure-like activity -Increase Lamictal 250 mg BID, continue Trileptal 300 mg twice daily -EEG was normal in February 2024 -Check routine labs today -If seizures continue, consider prolonged ambulatory EEG -Historically, has had difficulty tolerating seizure medication has previously taken Keppra, Zonegran with side effects, higher doses of Trileptal resulted in gait instability -Follow-up in 6 months or sooner if needed, call for seizures  No orders of the defined types were placed in this encounter.  HISTORY OF PRESENT ILLNESS: Today 09/08/23   07/06/23 SS: EEG was normal February 2024. Remains on Trileptal 300 mg BID, Lamictal 200/250. Reports 3 weeks ago, saw stars in her vision for 30 seconds, felt triggered by her sisters arguing, again 2 months ago. No loss of consciousness, felt tired when she stood. For last few years has been typical spell, usually triggered during stress. Doesn't drive.   Update 07/28/22 SS: Here with her sister to discuss recent seizure activity. About 3 weeks ago standing in line at drug store, vision dimmed with tunnel vision, leaned against counter, staff helped her, she came around was still about to function talk. Few days ago, fell over the side of her recliner, few seconds was out  of it. Was a little confused. Another time, got up from chair, leaned backwards, like frozen, pushing the chair back. Remains on Lamictal 200/250 mg daily, Trileptal 300 mg twice daily, no missed doses. Taking 2 weeks of prednisone taper (2 pills daily, then 1 pill the 2nd week) when seizures started. Yesterday under a lot of stress, had no events. When on prednisone, felt couldn't walk straight. Was on for shin splints.  Doesn't drive a car. Has been placed on gabapentin up to 300 mg TID, has shingles, present for 2 weeks. They think prednisone caused seizure events.   -March 2015 EEG showed brief episodes of theta slowing from the left temporal region -Video monitoring EEG at [facility], she didn't have any seizure events -[facility] placed her on Zonegran, but had anxiety and diarrhea, Lamictal was started in 2016 -MRI of the brain has question the possibility of mesial temporal sclerosis on the right -Pacemaker placed in 2016 due to complete heart block, question if this may be the etiology of seizure type events? -EEG during hospitalization January 2022 was normal -CT head November 2023 showed generalized cerebral atrophy microvascular disease  Update 05/20/2022 SS: Here today alone, her sister drove her. Has been having BP issues, in the ER 05/07/22, med adjustment, seems to be helping. Takes Trileptal 300 mg BID, Lamictal 200/250 mg. Under some stress with another sister moving in, during an argument may have had seizure staring off for 1 minute. Still can have gait instability. 1 fall when leaning forward.   Update 05/20/21 SS: [name] here today for follow-up of history of seizure disorder.  Remains on Trileptal and Lamictal.  Last seizure was in January 2022 when she was significantly ill. At last visit in Jan  2022 Lamictal level was 10.7, Trileptal was 17. No seizures of recent. Still lives with sister, doesn't drive a car. Other than lingering cough, no health issues. If she turns a corner,  she can veer off, this is not new. There have been no falls. Here today alone. In the past the dosage of Trileptal has been reduced to help with gait stability without change, the dose was increased back. Has pacemaker.   Update 11/11/2020 SS: Ocean Kearley is a 63 year old female with history of seizure disorder.  Last seizure was in January 2022, when she had significant illness requiring intubation with hypoxia and elevated blood glucose.  She is on Trileptal and Lamictal.  No further seizure events. Both medications were therapeutic at last office visit.  She is living with her sister.  Is currently not driving a car.  Has a pacemaker, sees cardiology. Claims has been doing well. At times feels dizzy, no falls.  Here today for evaluation unaccompanied.  HISTORY 07/08/2020 Dr. Anne Hahn: Ms. Oetken is a 63 year old left-handed white female with a history of a seizure disorder.  The patient has been treated with Trileptal and lamotrigine, she has not had a seizure since December 2019.  Unfortunately, the patient developed a gradual onset of shortness of breath several days prior to a June 19, 2020 admission to the hospital.  The patient began having severe shortness of breath on the day of admission, EMS was called and the patient had a seizure prior to going in the hospital.  She was noted to have oxygen saturations in the 60% range at that time, her white blood count was 18.6 when she went in the hospital.  She was felt to have an aspiration pneumonia and required intubation.  During the hospitalization, she was extubated and then had another seizure following extubation and then had to be reintubated again.  The patient never had blood levels of the Trileptal or lamotrigine done on admission.  Her ammonia level is 147 with the upper range of normal being 35.  The patient has since been discharged from the hospital, she remains on her current dose of the lamotrigine taking 200 mg twice daily and 50 mg in  the evening and she is on Trileptal 300 mg twice daily.  Higher levels of Trileptal in the past have resulted in increased gait instability.  The patient has not had any seizures since coming out of the hospital.  She is not operating a motor vehicle.  She reports some gait instability and fatigue and drowsiness at times, she has not had any falls.  She has not had any further seizures coming out of the hospital.  She reports occasional migratory head pains consistent with "ice pick pains".  She denies any numbness or weakness of the extremities.  She has a pacemaker in place, she cannot have MRI.  A CT scan of the brain was done in the hospital and was unremarkable.   REVIEW OF SYSTEMS: Out of a complete 14 system review of symptoms, the patient complains only of the following symptoms, and all other reviewed systems are negative.  See HPI  ALLERGIES: Allergies  Allergen Reactions   Zonisamide Anxiety    Panic Attacks   Aspirin Hives    Upset stomach   Ciprofloxacin     Seizures & it keeps her awake   Codeine Hives   Hydrocodone Hives   Oxycodone Hives   Penicillins Hives and Swelling    Pt tolerated ampicillin/sulbactam without issues 06/2020  HOME MEDICATIONS: Outpatient Medications Prior to Visit  Medication Sig Dispense Refill   acetaminophen (TYLENOL) 500 MG tablet Take 500-1,000 mg by mouth every 6 (six) hours as needed for moderate pain (pain).      albuterol (VENTOLIN HFA) 108 (90 Base) MCG/ACT inhaler Inhale 2 puffs into the lungs every 6 (six) hours as needed for wheezing or shortness of breath. 8 g 11   amLODipine (NORVASC) 5 MG tablet Take 5 mg by mouth daily.     aspirin EC 81 MG tablet Take 1 tablet (81 mg total) by mouth daily. Swallow whole.     atorvastatin (LIPITOR) 20 MG tablet Take 20 mg by mouth daily.     busPIRone (BUSPAR) 10 MG tablet Take 10 mg by mouth 2 (two) times daily.     carvedilol (COREG) 3.125 MG tablet Take 1 tablet (3.125 mg total) by mouth 2 (two)  times daily with a meal. 180 tablet 2   Cholecalciferol (VITAMIN D3) 2000 units capsule Take 1 capsule (2,000 Units total) by mouth daily. 90 capsule 0   clopidogrel (PLAVIX) 75 MG tablet TAKE 1 TABLET(75 MG) BY MOUTH DAILY WITH BREAKFAST 90 tablet 2   dapagliflozin propanediol (FARXIGA) 10 MG TABS tablet Take 1 tablet (10 mg total) by mouth daily before breakfast. 90 tablet 3   escitalopram (LEXAPRO) 20 MG tablet Take 1 tablet (20 mg total) by mouth daily. 30 tablet 0   fluticasone (FLONASE) 50 MCG/ACT nasal spray Place 1-2 sprays into both nostrils daily as needed for allergies or rhinitis.     lamoTRIgine (LAMICTAL) 200 MG tablet Take 1 tablet (200 mg total) by mouth 2 (two) times daily. 180 tablet 3   lamoTRIgine (LAMICTAL) 25 MG tablet Take 2 tablets (50 mg total) by mouth 2 (two) times daily. 360 tablet 3   montelukast (SINGULAIR) 10 MG tablet Take 10 mg by mouth at bedtime.     Oxcarbazepine (TRILEPTAL) 300 MG tablet Take 1 tablet (300 mg total) by mouth 2 (two) times daily. 180 tablet 3   pantoprazole (PROTONIX) 40 MG tablet Take 1 tablet by mouth daily.     Spacer/Aero-Holding Chambers DEVI 2 Inhalers by Does not apply route in the morning and at bedtime. 1 each 1   spironolactone (ALDACTONE) 25 MG tablet TAKE 1/2 TABLET(12.5 MG) BY MOUTH DAILY 45 tablet 2   valsartan (DIOVAN) 160 MG tablet Take 320 mg by mouth daily.     No facility-administered medications prior to visit.    PAST MEDICAL HISTORY: Past Medical History:  Diagnosis Date   Arthritis    CHF (congestive heart failure) (HCC)    Chronic cough    Chronic insomnia 03/07/2015   Diverticulitis    GERD (gastroesophageal reflux disease)    Gout    Hiatal hernia    Hypertension    LBBB (left bundle branch block)    Pacemaker    Medtronic due to heart block   PAD (peripheral artery disease) (HCC) 11/03/2022   ABIs/arterial Dopplers 12/03/2022: Bilateral EIA >50, mild atherosclerosis throughout // ABIs-right 0.97, left  0.62   Panic disorder 09/07/2014   Pneumonia    x 1 - Walking   Seizures (HCC)    Last Seizure in 2017 - controlled with meds    Sleep apnea    does not use a CPAP due to grand mal seizure   Wrist tendonitis    R     PAST SURGICAL HISTORY: Past Surgical History:  Procedure Laterality Date   ABDOMINAL AORTOGRAM  W/LOWER EXTREMITY N/A 01/07/2023   Procedure: ABDOMINAL AORTOGRAM W/LOWER EXTREMITY;  Surgeon: Runell Gess, MD;  Location: [facility] INVASIVE CV LAB;  Service: Cardiovascular;  Laterality: N/A;   COLONOSCOPY  last 03/31/2016   COLONOSCOPY WITH PROPOFOL N/A 04/04/2019   Procedure: COLONOSCOPY WITH PROPOFOL;  Surgeon: Napoleon Form, MD;  Location: [facility] ENDOSCOPY;  Service: Endoscopy;  Laterality: N/A;   COLONOSCOPY WITH PROPOFOL N/A 06/15/2019   Procedure: COLONOSCOPY WITH PROPOFOL;  Surgeon: Meridee Score Netty Starring., MD;  Location: [facility] ENDOSCOPY;  Service: Gastroenterology;  Laterality: N/A;   COLONOSCOPY WITH PROPOFOL N/A 02/28/2020   Procedure: COLONOSCOPY WITH PROPOFOL;  Surgeon: Meridee Score Netty Starring., MD;  Location: [facility] ENDOSCOPY;  Service: Gastroenterology;  Laterality: N/A;   COLONOSCOPY WITH PROPOFOL N/A 06/11/2022   Procedure: COLONOSCOPY WITH PROPOFOL;  Surgeon: Meridee Score Netty Starring., MD;  Location: [facility] ENDOSCOPY;  Service: Gastroenterology;  Laterality: N/A;   ENDOROTOR  02/28/2020   Procedure: ZOXWRUEAV;  Surgeon: Mansouraty, Netty Starring., MD;  Location: [facility] ENDOSCOPY;  Service: Gastroenterology;;   ENDOSCOPIC MUCOSAL RESECTION N/A 06/15/2019   Procedure: ENDOSCOPIC MUCOSAL RESECTION;  Surgeon: Lemar Lofty., MD;  Location: [facility] ENDOSCOPY;  Service: Gastroenterology;  Laterality: N/A;   ENDOSCOPIC MUCOSAL RESECTION N/A 02/28/2020   Procedure: ENDOSCOPIC MUCOSAL RESECTION;  Surgeon: Meridee Score Netty Starring., MD;  Location: [facility] ENDOSCOPY;  Service: Gastroenterology;  Laterality: N/A;   ENDOSCOPIC MUCOSAL RESECTION N/A 06/11/2022   Procedure: ENDOSCOPIC MUCOSAL  RESECTION;  Surgeon: Meridee Score Netty Starring., MD;  Location: [facility] ENDOSCOPY;  Service: Gastroenterology;  Laterality: N/A;   EP IMPLANTABLE DEVICE N/A 12/13/2014   Procedure: Pacemaker Implant;  Surgeon: Duke Salvia, MD;  Location: [facility] INVASIVE CV LAB;  Service: Cardiovascular;  Laterality: N/A;   HAND SURGERY     HEMOSTASIS CLIP PLACEMENT  06/15/2019   Procedure: HEMOSTASIS CLIP PLACEMENT;  Surgeon: Lemar Lofty., MD;  Location: [facility] ENDOSCOPY;  Service: Gastroenterology;;   HEMOSTASIS CLIP PLACEMENT  02/28/2020   Procedure: HEMOSTASIS CLIP PLACEMENT;  Surgeon: Lemar Lofty., MD;  Location: [facility] ENDOSCOPY;  Service: Gastroenterology;;   HEMOSTASIS CLIP PLACEMENT  06/11/2022   Procedure: HEMOSTASIS CLIP PLACEMENT;  Surgeon: Lemar Lofty., MD;  Location: [facility] ENDOSCOPY;  Service: Gastroenterology;;   HEMOSTASIS CONTROL  02/28/2020   Procedure: HEMOSTASIS CONTROL;  Surgeon: Lemar Lofty., MD;  Location: [facility] ENDOSCOPY;  Service: Gastroenterology;;   POLYPECTOMY     POLYPECTOMY  04/04/2019   Procedure: POLYPECTOMY;  Surgeon: Napoleon Form, MD;  Location: [facility] ENDOSCOPY;  Service: Endoscopy;;   POLYPECTOMY  06/15/2019   Procedure: POLYPECTOMY;  Surgeon: Lemar Lofty., MD;  Location: [facility] ENDOSCOPY;  Service: Gastroenterology;;   POLYPECTOMY  06/11/2022   Procedure: POLYPECTOMY;  Surgeon: Lemar Lofty., MD;  Location: [facility] ENDOSCOPY;  Service: Gastroenterology;;   SKIN CANCER EXCISION     SUBMUCOSAL LIFTING INJECTION  06/15/2019   Procedure: SUBMUCOSAL LIFTING INJECTION;  Surgeon: Lemar Lofty., MD;  Location: [facility] ENDOSCOPY;  Service: Gastroenterology;;   SUBMUCOSAL LIFTING INJECTION  06/11/2022   Procedure: SUBMUCOSAL LIFTING INJECTION;  Surgeon: Lemar Lofty., MD;  Location: [facility] ENDOSCOPY;  Service: Gastroenterology;;   TUBAL LIGATION     UPPER GI ENDOSCOPY     WISDOM TOOTH EXTRACTION      FAMILY HISTORY: Family History   Problem Relation Age of Onset   Allergic rhinitis Mother    Alzheimer's disease Mother    Hypertension Mother    Colon polyps Mother    Allergic rhinitis Father    Hypertension Father    Stroke  Father    Leukemia Father    Prostate cancer Father    Colon polyps Father    Allergic rhinitis Sister    Colon polyps Sister    Allergic rhinitis Sister    Kidney failure Sister    Diabetes Sister    Colon polyps Sister    Allergic rhinitis Brother    Stroke Brother    Hypertension Brother    Sleep apnea Brother    Colon polyps Brother    Colon cancer Paternal Aunt    Colon cancer Paternal Uncle    Parkinson's disease Maternal Grandmother    Coronary artery disease Maternal Grandfather    Esophageal cancer Neg Hx    Stomach cancer Neg Hx    Rectal cancer Neg Hx    Inflammatory bowel disease Neg Hx    Liver disease Neg Hx    Pancreatic cancer Neg Hx    Seizures Neg Hx     SOCIAL HISTORY: Social History   Socioeconomic History   Marital status: Single    Spouse name: Not on file   Number of children: 0   Years of education: 14   Highest education level: Not on file  Occupational History   Not on file  Tobacco Use   Smoking status: Some Days    Current packs/day: 0.25    Average packs/day: 0.3 packs/day for 48.2 years (12.1 ttl pk-yrs)    Types: Cigarettes    Start date: 1977    Passive exposure: Current   Smokeless tobacco: Never   Tobacco comments:    Pt states she smokes 2 cigs a day and sometime 4 a week    01/27/2023 patient states she smoke 3 cigarettes on the days that she does smoke  Vaping Use   Vaping status: Never Used  Substance and Sexual Activity   Alcohol use: Not Currently    Comment: Quit 12/07/2015   Drug use: Never   Sexual activity: Not Currently    Birth control/protection: Post-menopausal  Other Topics Concern   Not on file  Social History Narrative   Lives with sister   Left handed   Drinks 1-2 cups caffeine daily   Social Drivers  of Health   Financial Resource Strain: Not on file  Food Insecurity: No Food Insecurity (01/07/2023)   Hunger Vital Sign    Worried About Running Out of Food in the Last Year: Never true    Ran Out of Food in the Last Year: Never true  Transportation Needs: No Transportation Needs (01/07/2023)   PRAPARE - Administrator, Civil Service (Medical): No    Lack of Transportation (Non-Medical): No  Physical Activity: Not on file  Stress: Not on file  Social Connections: Not on file  Intimate Partner Violence: Not At Risk (01/07/2023)   Humiliation, Afraid, Rape, and Kick questionnaire    Fear of Current or Ex-Partner: No    Emotionally Abused: No    Physically Abused: No    Sexually Abused: No   PHYSICAL EXAM  There were no vitals filed for this visit.  There is no height or weight on file to calculate BMI.  Generalized: Well developed, in no acute distress  Neurological examination  Mentation: Alert oriented to time, place, history taking. Follows all commands speech and language fluent Cranial nerve II-XII: Pupils were equal round reactive to light. Extraocular movements were full, visual field were full on confrontational test. Facial sensation and strength were normal. Head turning and shoulder shrug  were  normal and symmetric. Motor: The motor testing reveals 5 over 5 strength of all 4 extremities. Good symmetric motor tone is noted throughout.  Sensory: Sensory testing is intact to soft touch on all 4 extremities. No evidence of extinction is noted.  Coordination: Cerebellar testing reveals good finger-nose-finger and heel-to-shin bilaterally.  Gait and station: Gait is normal, independent Reflexes: Deep tendon reflexes are symmetric and normal bilaterally.   DIAGNOSTIC DATA (LABS, IMAGING, TESTING) - I reviewed patient records, labs, notes, testing and imaging myself where available.  Lab Results  Component Value Date   WBC 8.4 02/25/2023   HGB 13.1 02/25/2023   HCT  40.5 02/25/2023   MCV 96 02/25/2023   PLT 265 02/25/2023      Component Value Date/Time   NA 143 02/25/2023 1307   K 3.8 02/25/2023 1307   CL 106 02/25/2023 1307   CO2 21 02/25/2023 1307   GLUCOSE 94 02/25/2023 1307   GLUCOSE 125 (H) 01/08/2023 0031   BUN 8 02/25/2023 1307   CREATININE 1.30 (H) 02/25/2023 1307   CALCIUM 8.9 02/25/2023 1307   PROT 6.8 02/25/2023 1307   ALBUMIN 4.2 02/25/2023 1307   AST 12 02/25/2023 1307   ALT 11 02/25/2023 1307   ALKPHOS 154 (H) 02/25/2023 1307   BILITOT 0.3 02/25/2023 1307   GFRNONAA 42 (L) 01/08/2023 0031   GFRAA 53 (L) 07/29/2020 0902   Lab Results  Component Value Date   CHOL 140 01/08/2023   HDL 36 (L) 01/08/2023   LDLCALC 68 01/08/2023   LDLDIRECT 199.0 03/02/2016   TRIG 180 (H) 01/08/2023   CHOLHDL 3.9 01/08/2023   Lab Results  Component Value Date   HGBA1C 4.9 06/19/2020   Lab Results  Component Value Date   VITAMINB12 177 (L) 04/11/2015   Lab Results  Component Value Date   TSH 1.399 12/12/2014    Margie Ege, AGNP-C, DNP 09/08/2023, 4:26 PM [facility] [address], [address] [place], [place] [address] [phone]

## 2023-09-09 ENCOUNTER — Encounter: Payer: Self-pay | Admitting: Neurology

## 2023-09-09 ENCOUNTER — Ambulatory Visit (INDEPENDENT_AMBULATORY_CARE_PROVIDER_SITE_OTHER): Admitting: Neurology

## 2023-09-09 VITALS — BP 104/60 | HR 65 | Ht 61.5 in | Wt 204.0 lb

## 2023-09-09 DIAGNOSIS — R569 Unspecified convulsions: Secondary | ICD-10-CM

## 2023-09-09 MED ORDER — LAMOTRIGINE 25 MG PO TABS: 50.0000 mg | ORAL_TABLET | Freq: Two times a day (BID) | ORAL | 3 refills | Status: AC

## 2023-09-09 MED ORDER — LAMOTRIGINE 200 MG PO TABS: 200.0000 mg | ORAL_TABLET | Freq: Two times a day (BID) | ORAL | 3 refills | Status: AC

## 2023-09-09 MED ORDER — OXCARBAZEPINE 300 MG PO TABS: 300.0000 mg | ORAL_TABLET | Freq: Two times a day (BID) | ORAL | 3 refills | Status: AC

## 2023-09-09 NOTE — Patient Instructions (Signed)
 Great to see you today.  We will check labs.  Continue current seizure medications.  Call for seizure events.  Follow-up in 1 year.  Thanks!!

## 2023-09-10 ENCOUNTER — Telehealth: Payer: Self-pay | Admitting: Physician Assistant

## 2023-09-10 NOTE — Telephone Encounter (Signed)
 Spoke to patient  she stated she has been having swelling and pain in both lower legs for the past 2 to 3 days.She has sob with exertion.Her weight is up at 204 lbs.Appointment scheduled with Dr.Berry 4/9 at 1:15 pm.

## 2023-09-10 NOTE — Telephone Encounter (Signed)
  Per MyChart scheduling message:  Pt c/o swelling/edema: STAT if pt has developed SOB within 24 hours  If swelling, where is the swelling located?   How much weight have you gained and in what time span?   Have you gained 2 pounds in a day or 5 pounds in a week?   Do you have a log of your daily weights (if so, list)?   Are you currently taking a fluid pill?   Are you currently SOB?   Have you traveled recently in a car or plane for an extended period of time?   Initial complaint: weight gain, legs swelling and redness. yeast infection on crease of leg   (1)  legs  ( 2 ) 3 months   40   (3) yes   (4) no   (5) no   (6) some times (7)  no it starting to ach when I walk          Thank you

## 2023-09-12 LAB — COMPREHENSIVE METABOLIC PANEL WITH GFR
ALT: 16 IU/L (ref 0–32)
AST: 13 IU/L (ref 0–40)
Albumin: 4.3 g/dL (ref 3.9–4.9)
Alkaline Phosphatase: 137 IU/L — ABNORMAL HIGH (ref 44–121)
BUN/Creatinine Ratio: 11 — ABNORMAL LOW (ref 12–28)
BUN: 17 mg/dL (ref 8–27)
Bilirubin Total: 0.3 mg/dL (ref 0.0–1.2)
CO2: 21 mmol/L (ref 20–29)
Calcium: 9.4 mg/dL (ref 8.7–10.3)
Chloride: 102 mmol/L (ref 96–106)
Creatinine, Ser: 1.59 mg/dL — ABNORMAL HIGH (ref 0.57–1.00)
Globulin, Total: 2.4 g/dL (ref 1.5–4.5)
Glucose: 97 mg/dL (ref 70–99)
Potassium: 4.7 mmol/L (ref 3.5–5.2)
Sodium: 140 mmol/L (ref 134–144)
Total Protein: 6.7 g/dL (ref 6.0–8.5)
eGFR: 36 mL/min/{1.73_m2} — ABNORMAL LOW (ref >59)

## 2023-09-12 LAB — CBC WITH DIFFERENTIAL/PLATELET
Basophils Absolute: 0 10*3/uL (ref 0.0–0.2)
Basos: 0 %
EOS (ABSOLUTE): 0.2 10*3/uL (ref 0.0–0.4)
Eos: 2 %
Hematocrit: 39.6 % (ref 34.0–46.6)
Hemoglobin: 13.3 g/dL (ref 11.1–15.9)
Immature Grans (Abs): 0 10*3/uL (ref 0.0–0.1)
Immature Granulocytes: 0 %
Lymphocytes Absolute: 3.1 10*3/uL (ref 0.7–3.1)
Lymphs: 33 %
MCH: 32.7 pg (ref 26.6–33.0)
MCHC: 33.6 g/dL (ref 31.5–35.7)
MCV: 97 fL (ref 79–97)
Monocytes Absolute: 0.6 10*3/uL (ref 0.1–0.9)
Monocytes: 6 %
Neutrophils Absolute: 5.7 10*3/uL (ref 1.4–7.0)
Neutrophils: 59 %
Platelets: 253 10*3/uL (ref 150–450)
RBC: 4.07 x10E6/uL (ref 3.77–5.28)
RDW: 13.8 % (ref 11.7–15.4)
WBC: 9.6 10*3/uL (ref 3.4–10.8)

## 2023-09-12 LAB — 10-HYDROXYCARBAZEPINE: Oxcarbazepine SerPl-Mcnc: 17 ug/mL (ref 10–35)

## 2023-09-12 LAB — LAMOTRIGINE LEVEL: Lamotrigine Lvl: 8.3 ug/mL (ref 2.0–20.0)

## 2023-09-13 ENCOUNTER — Encounter: Payer: Self-pay | Admitting: Neurology

## 2023-09-14 DIAGNOSIS — G4733 Obstructive sleep apnea (adult) (pediatric): Secondary | ICD-10-CM | POA: Diagnosis not present

## 2023-09-15 ENCOUNTER — Ambulatory Visit: Attending: Cardiovascular Disease | Admitting: Cardiovascular Disease

## 2023-09-15 ENCOUNTER — Encounter: Payer: Self-pay | Admitting: Cardiovascular Disease

## 2023-09-15 VITALS — BP 120/60 | HR 71 | Ht 61.5 in | Wt 206.0 lb

## 2023-09-15 DIAGNOSIS — I739 Peripheral vascular disease, unspecified: Secondary | ICD-10-CM

## 2023-09-15 DIAGNOSIS — I5022 Chronic systolic (congestive) heart failure: Secondary | ICD-10-CM

## 2023-09-15 NOTE — Assessment & Plan Note (Signed)
 History of PAD status post peripheral angiography by myself 01/07/2023 revealing a 70% distal left common iliac artery stenosis with a 60 mm gradient.  I performed PTA and stenting using a 8 mm x 40 mm long Abbott nitinol absolute Pro self-expanding stent with an excellent result.  She was discharged home the following day.  Her Dopplers normalized and her claudication has resolved.  Will continue to follow her Dopplers on annual basis.

## 2023-09-15 NOTE — Progress Notes (Signed)
 09/15/2023 RUHANI UMLAND   [birth year]  [id number]  Primary Physician Carin Hock, PA Primary Cardiologist: Runell Gess MD FACP, [place], [place], [place]  HPI:  [name] [name] is a 63 y.o.  moderately overweight divorced Caucasian female with no children who I last saw in the office 01/27/2023.  She was referred by Tereso Newcomer, PA-C-see for evaluation of lifestyle-limiting claudication.  She is disabled because of epilepsy.  Her risk factors include 25 pack years of tobacco abuse smoking 1/2 pack/day recalcitrant to risk factor modification.  History of hypertension and hyperlipidemia.  One of her sisters has had coronary intervention with stents.  She is never had a heart attack or stroke.  She denies chest pain or shortness of breath.  She has noticed left greater than right lower extremity claudication for the last several months with recent Doppler studies performed 12/02/2022 revealing a decreased left ABI of 0.62 with a high-frequency signal in her left extrailiac artery.   I performed peripheral angiography of her 01/07/2023 revealing of 70% distal left common iliac artery stenosis with a 60 mmHg gradient.  I performed PTA and stenting using an 8 mm x 40 mm long Abbott absolute Pro nitinol self-expanding stent with an excellent angiographic result.  She was discharged home the following day.  Her Dopplers normalized and her claudication has resolved.  She is on DAPT.  Since I saw her 8 months ago she continues to do well.  She denies claudication.  She denies chest pain or shortness of breath.  She does have some lower extremity edema by her account but not on exam today.  Dr. Graciela Husbands follows her pacemaker.     Current Meds  Medication Sig   acetaminophen (TYLENOL) 500 MG tablet Take 500-1,000 mg by mouth every 6 (six) hours as needed for moderate pain (pain).    albuterol (VENTOLIN HFA) 108 (90 Base) MCG/ACT inhaler Inhale 2 puffs into the lungs every 6 (six) hours as needed for  wheezing or shortness of breath.   amLODipine (NORVASC) 5 MG tablet Take 5 mg by mouth daily.   aspirin EC 81 MG tablet Take 1 tablet (81 mg total) by mouth daily. Swallow whole.   atorvastatin (LIPITOR) 20 MG tablet Take 20 mg by mouth daily.   busPIRone (BUSPAR) 10 MG tablet Take 10 mg by mouth 2 (two) times daily.   carvedilol (COREG) 3.125 MG tablet Take 1 tablet (3.125 mg total) by mouth 2 (two) times daily with a meal.   Cholecalciferol (VITAMIN D3) 2000 units capsule Take 1 capsule (2,000 Units total) by mouth daily.   clopidogrel (PLAVIX) 75 MG tablet TAKE 1 TABLET(75 MG) BY MOUTH DAILY WITH BREAKFAST   dapagliflozin propanediol (FARXIGA) 10 MG TABS tablet Take 1 tablet (10 mg total) by mouth daily before breakfast.   escitalopram (LEXAPRO) 20 MG tablet Take 1 tablet (20 mg total) by mouth daily.   fluticasone (FLONASE) 50 MCG/ACT nasal spray Place 1-2 sprays into both nostrils daily as needed for allergies or rhinitis.   lamoTRIgine (LAMICTAL) 200 MG tablet Take 1 tablet (200 mg total) by mouth 2 (two) times daily.   lamoTRIgine (LAMICTAL) 25 MG tablet Take 2 tablets (50 mg total) by mouth 2 (two) times daily.   montelukast (SINGULAIR) 10 MG tablet Take 10 mg by mouth at bedtime.   Oxcarbazepine (TRILEPTAL) 300 MG tablet Take 1 tablet (300 mg total) by mouth 2 (two) times daily.   pantoprazole (PROTONIX) 40 MG tablet Take 1 tablet  by mouth daily.   Spacer/Aero-Holding Chambers DEVI 2 Inhalers by Does not apply route in the morning and at bedtime.   spironolactone (ALDACTONE) 25 MG tablet TAKE 1/2 TABLET(12.5 MG) BY MOUTH DAILY   valsartan (DIOVAN) 160 MG tablet Take 320 mg by mouth daily.     Allergies  Allergen Reactions   Zonisamide Anxiety    Panic Attacks   Aspirin Hives    Upset stomach   Ciprofloxacin     Seizures & it keeps her awake   Codeine Hives   Hydrocodone Hives   Oxycodone Hives   Penicillins Hives and Swelling    Pt tolerated ampicillin/sulbactam without  issues 06/2020    Social History   Socioeconomic History   Marital status: Single    Spouse name: Not on file   Number of children: 0   Years of education: 14   Highest education level: Not on file  Occupational History   Not on file  Tobacco Use   Smoking status: Some Days    Current packs/day: 0.25    Average packs/day: 0.3 packs/day for 48.3 years (12.1 ttl pk-yrs)    Types: Cigarettes    Start date: 1977    Passive exposure: Current   Smokeless tobacco: Never   Tobacco comments:    Pt states she smokes 2 cigs a day and sometime 4 a week    01/27/2023 patient states she smoke 3 cigarettes on the days that she does smoke  Vaping Use   Vaping status: Never Used  Substance and Sexual Activity   Alcohol use: Not Currently    Comment: Quit 12/07/2015   Drug use: Never   Sexual activity: Not Currently    Birth control/protection: Post-menopausal  Other Topics Concern   Not on file  Social History Narrative   Lives with sister   Left handed   Drinks 1-2 cups caffeine daily   Social Drivers of Health   Financial Resource Strain: Not on file  Food Insecurity: No Food Insecurity (01/07/2023)   Hunger Vital Sign    Worried About Running Out of Food in the Last Year: Never true    Ran Out of Food in the Last Year: Never true  Transportation Needs: No Transportation Needs (01/07/2023)   PRAPARE - Administrator, Civil Service (Medical): No    Lack of Transportation (Non-Medical): No  Physical Activity: Not on file  Stress: Not on file  Social Connections: Not on file  Intimate Partner Violence: Not At Risk (01/07/2023)   Humiliation, Afraid, Rape, and Kick questionnaire    Fear of Current or Ex-Partner: No    Emotionally Abused: No    Physically Abused: No    Sexually Abused: No     Review of Systems: General: negative for chills, fever, night sweats or weight changes.  Cardiovascular: negative for chest pain, dyspnea on exertion, edema, orthopnea,  palpitations, paroxysmal nocturnal dyspnea or shortness of breath Dermatological: negative for rash Respiratory: negative for cough or wheezing Urologic: negative for hematuria Abdominal: negative for nausea, vomiting, diarrhea, bright red blood per rectum, melena, or hematemesis Neurologic: negative for visual changes, syncope, or dizziness All other systems reviewed and are otherwise negative except as noted above.    Blood pressure 120/60, pulse 71, height 5' 1.5" (1.562 m), weight 206 lb (93.4 kg), SpO2 94%.  General appearance: alert and no distress Neck: no adenopathy, no carotid bruit, no JVD, supple, symmetrical, trachea midline, and thyroid not enlarged, symmetric, no tenderness/mass/nodules Lungs: clear to  auscultation bilaterally Heart: regular rate and rhythm, S1, S2 normal, no murmur, click, rub or gallop Extremities: extremities normal, atraumatic, no cyanosis or edema Pulses: 2+ and symmetric Skin: Skin color, texture, turgor normal. No rashes or lesions Neurologic: Grossly normal  EKG EKG Interpretation Date/Time:  Wednesday September 15 2023 13:21:04 EDT Ventricular Rate:  71 PR Interval:  244 QRS Duration:  180 QT Interval:  474 QTC Calculation: 515 R Axis:   -79  Text Interpretation: Atrial-sensed ventricular-paced rhythm with prolonged AV conduction When compared with ECG of 07-May-2022 02:32, PREVIOUS ECG IS PRESENT Confirmed by Nanetta Batty [phone]) on 09/15/2023 1:22:38 PM    ASSESSMENT AND PLAN:   PAD (peripheral artery disease) (HCC) History of PAD status post peripheral angiography by myself 01/07/2023 revealing a 70% distal left common iliac artery stenosis with a 60 mm gradient.  I performed PTA and stenting using a 8 mm x 40 mm long Abbott nitinol absolute Pro self-expanding stent with an excellent result.  She was discharged home the following day.  Her Dopplers normalized and her claudication has resolved.  Will continue to follow her Dopplers on annual  basis.     Runell Gess MD FACP,FACC,FAHA, [facility] 09/15/2023 1:30 PM

## 2023-09-15 NOTE — Patient Instructions (Signed)
 Medication Instructions:  NO CHANGES  *If you need a refill on your cardiac medications before your next appointment, please call your pharmacy*  Testing/Procedures: Doppler Studies as scheduled on 09/28/23  Follow-Up: At [facility], you and your health needs are our priority.  As part of our continuing mission to provide you with exceptional heart care, our providers are all part of one team.  This team includes your primary Cardiologist (physician) and Advanced Practice Providers or APPs (Physician Assistants and Nurse Practitioners) who all work together to provide you with the care you need, when you need it.  Your next appointment:   AS NEEDED with Dr. Allyson Sabal     1st Floor: - Lobby - Registration  - Pharmacy  - Lab - Cafe  2nd Floor: - PV Lab - Diagnostic Testing (echo, CT, nuclear med)  3rd Floor: - Vacant  4th Floor: - TCTS (cardiothoracic surgery) - AFib Clinic - Structural Heart Clinic - Vascular Surgery  - Vascular Ultrasound  5th Floor: - HeartCare Cardiology (general and EP) - Clinical Pharmacy for coumadin, hypertension, lipid, weight-loss medications, and med management appointments    Valet parking services will be available as well.

## 2023-09-16 ENCOUNTER — Ambulatory Visit: Payer: Medicare HMO | Admitting: Neurology

## 2023-09-28 ENCOUNTER — Ambulatory Visit (HOSPITAL_COMMUNITY)
Admission: RE | Admit: 2023-09-28 | Discharge: 2023-09-28 | Disposition: A | Discharge status: Home / Self Care | Source: Ambulatory Visit | Attending: Cardiology | Admitting: Cardiology

## 2023-09-28 ENCOUNTER — Ambulatory Visit (HOSPITAL_COMMUNITY)
Admission: RE | Admit: 2023-09-28 | Discharge: 2023-09-28 | Disposition: A | Discharge status: Home / Self Care | Source: Ambulatory Visit | Attending: Cardiovascular Disease | Admitting: Cardiovascular Disease

## 2023-09-28 DIAGNOSIS — I739 Peripheral vascular disease, unspecified: Secondary | ICD-10-CM

## 2023-09-28 DIAGNOSIS — Z95820 Peripheral vascular angioplasty status with implants and grafts: Secondary | ICD-10-CM | POA: Diagnosis not present

## 2023-09-28 LAB — VAS US ABI WITH/WO TBI
Left ABI: 0.91
Right ABI: 0.91

## 2023-09-29 ENCOUNTER — Encounter: Payer: Self-pay | Admitting: Internal Medicine

## 2023-09-29 ENCOUNTER — Ambulatory Visit: Attending: Internal Medicine | Admitting: Internal Medicine

## 2023-09-29 ENCOUNTER — Other Ambulatory Visit: Payer: Self-pay | Admitting: *Deleted

## 2023-09-29 ENCOUNTER — Encounter: Payer: Self-pay | Admitting: *Deleted

## 2023-09-29 VITALS — BP 129/60 | HR 65 | Ht 60.5 in | Wt 205.6 lb

## 2023-09-29 DIAGNOSIS — Z8669 Personal history of other diseases of the nervous system and sense organs: Secondary | ICD-10-CM

## 2023-09-29 DIAGNOSIS — Z959 Presence of cardiac and vascular implant and graft, unspecified: Secondary | ICD-10-CM

## 2023-09-29 DIAGNOSIS — I442 Atrioventricular block, complete: Secondary | ICD-10-CM

## 2023-09-29 DIAGNOSIS — Z79899 Other long term (current) drug therapy: Secondary | ICD-10-CM

## 2023-09-29 DIAGNOSIS — I5022 Chronic systolic (congestive) heart failure: Secondary | ICD-10-CM | POA: Diagnosis not present

## 2023-09-29 DIAGNOSIS — E039 Hypothyroidism, unspecified: Secondary | ICD-10-CM

## 2023-09-29 DIAGNOSIS — I739 Peripheral vascular disease, unspecified: Secondary | ICD-10-CM

## 2023-09-29 NOTE — Patient Instructions (Signed)
 Medication Instructions:  Your physician recommends that you continue on your current medications as directed. Please refer to the Current Medication list given to you today.  *If you need a refill on your cardiac medications before your next appointment, please call your pharmacy*  Lab Work: BMET and TSH today If you have labs (blood work) drawn today and your tests are completely normal, you will receive your results only by: MyChart Message (if you have MyChart) OR A paper copy in the mail If you have any lab test that is abnormal or we need to change your treatment, we will call you to review the results.  Testing/Procedures: None ordered.   Follow-Up: At [facility], you and your health needs are our priority.  As part of our continuing mission to provide you with exceptional heart care, our providers are all part of one team.  This team includes your primary Cardiologist (physician) and Advanced Practice Providers or APPs (Physician Assistants and Nurse Practitioners) who all work together to provide you with the care you need, when you need it.  Your next appointment:   Please scheduled appointment with Dr Gaylyn Keas for sleep apnea.  Keep appointment with Marlyse Single as scheduled.  12 months for Pacemaker follow up      1st Floor: - Lobby - Registration  - Pharmacy  - Lab - Cafe  2nd Floor: - PV Lab - Diagnostic Testing (echo, CT, nuclear med)  3rd Floor: - Vacant  4th Floor: - TCTS (cardiothoracic surgery) - AFib Clinic - Structural Heart Clinic - Vascular Surgery  - Vascular Ultrasound  5th Floor: - HeartCare Cardiology (general and EP) - Clinical Pharmacy for coumadin, hypertension, lipid, weight-loss medications, and med management appointments    Valet parking services will be available as well.

## 2023-09-29 NOTE — Progress Notes (Signed)
 Patient Care Team: Dickey Fought, [place] as PCP - General (Physician Assistant) Verona Goodwill, MD as PCP - Electrophysiology (Cardiology) Rankins, Laurette Pool, MD (Family Medicine) Sherwood Donath as Physician Assistant (Cardiology) Debbie Fails, PA-C as Physician Assistant (Clinical Cardiac Electrophysiology)   HPI  [name] [name] is a 63 y.o. female Seen in follow-up for pacemaker implanted 2016 Medtronic for intermittent complete heart block, a history of "seizures" associated with documented bradycardia and complete heart block in  the setting of underlying left bundle branch block.  Discussions with neurology thereafter prompted the discontinuation of her antiepileptics; however, further discussions with her primary neurologist redirected that plan the thought that drugs would be weaned over 6 months.   History of peripheral vascular disease with prior lower extremity revascularization  The patient denies chest pai, nocturnal dyspnea, orthopnea or peripheral edema.  There have been no palpitations, lightheadedness or syncope.  Complains of fatigue and dyspnea on exertion.    Has a diagnosis of sleep apnea.  CPAP machine broke.  Weight gain     Date Cr K Hgb  2/24 1.22 4.3 13.7  4/25 1.59 4.7 13.3    DATE TEST EF   7/16 Echo  50-55%   1/22 Echo  45-50%   4/22 Echo   55 %   10/23 Echo   45-50 %   10/24 Echo  50-55%          Past Medical History:  Diagnosis Date   Arthritis    CHF (congestive heart failure) (HCC)    Chronic cough    Chronic insomnia 03/07/2015   Diverticulitis    GERD (gastroesophageal reflux disease)    Gout    Hiatal hernia    Hypertension    LBBB (left bundle branch block)    Pacemaker    Medtronic due to heart block   PAD (peripheral artery disease) (HCC) 11/03/2022   ABIs/arterial Dopplers 12/03/2022: Bilateral EIA >50, mild atherosclerosis throughout // ABIs-right 0.97, left 0.62   Panic disorder 09/07/2014    Pneumonia    x 1 - Walking   Seizures (HCC)    Last Seizure in 2017 - controlled with meds    Sleep apnea    does not use a CPAP due to grand mal seizure   Wrist tendonitis    R     Past Surgical History:  Procedure Laterality Date   ABDOMINAL AORTOGRAM W/LOWER EXTREMITY N/A 01/07/2023   Procedure: ABDOMINAL AORTOGRAM W/LOWER EXTREMITY;  Surgeon: Avanell Leigh, MD;  Location: [facility] INVASIVE CV LAB;  Service: Cardiovascular;  Laterality: N/A;   COLONOSCOPY  last 03/31/2016   COLONOSCOPY WITH PROPOFOL  N/A 04/04/2019   Procedure: COLONOSCOPY WITH PROPOFOL ;  Surgeon: Sergio Dandy, MD;  Location: [facility] ENDOSCOPY;  Service: Endoscopy;  Laterality: N/A;   COLONOSCOPY WITH PROPOFOL  N/A 06/15/2019   Procedure: COLONOSCOPY WITH PROPOFOL ;  Surgeon: Mansouraty, Albino Alu., MD;  Location: [facility] ENDOSCOPY;  Service: Gastroenterology;  Laterality: N/A;   COLONOSCOPY WITH PROPOFOL  N/A 02/28/2020   Procedure: COLONOSCOPY WITH PROPOFOL ;  Surgeon: Mansouraty, Albino Alu., MD;  Location: [facility] ENDOSCOPY;  Service: Gastroenterology;  Laterality: N/A;   COLONOSCOPY WITH PROPOFOL  N/A 06/11/2022   Procedure: COLONOSCOPY WITH PROPOFOL ;  Surgeon: Brice Campi Albino Alu., MD;  Location: [facility] ENDOSCOPY;  Service: Gastroenterology;  Laterality: N/A;   ENDOROTOR  02/28/2020   Procedure: RUEAVWUJW;  Surgeon: Mansouraty, Albino Alu., MD;  Location: [facility] ENDOSCOPY;  Service: Gastroenterology;;   ENDOSCOPIC MUCOSAL RESECTION N/A 06/15/2019  Procedure: ENDOSCOPIC MUCOSAL RESECTION;  Surgeon: Brice Campi Albino Alu., MD;  Location: [facility] ENDOSCOPY;  Service: Gastroenterology;  Laterality: N/A;   ENDOSCOPIC MUCOSAL RESECTION N/A 02/28/2020   Procedure: ENDOSCOPIC MUCOSAL RESECTION;  Surgeon: Brice Campi Albino Alu., MD;  Location: [facility] ENDOSCOPY;  Service: Gastroenterology;  Laterality: N/A;   ENDOSCOPIC MUCOSAL RESECTION N/A 06/11/2022   Procedure: ENDOSCOPIC MUCOSAL RESECTION;  Surgeon: Brice Campi Albino Alu., MD;  Location: [facility]  ENDOSCOPY;  Service: Gastroenterology;  Laterality: N/A;   EP IMPLANTABLE DEVICE N/A 12/13/2014   Procedure: Pacemaker Implant;  Surgeon: Verona Goodwill, MD;  Location: [facility] INVASIVE CV LAB;  Service: Cardiovascular;  Laterality: N/A;   HAND SURGERY     HEMOSTASIS CLIP PLACEMENT  06/15/2019   Procedure: HEMOSTASIS CLIP PLACEMENT;  Surgeon: Normie Becton., MD;  Location: [facility] ENDOSCOPY;  Service: Gastroenterology;;   HEMOSTASIS CLIP PLACEMENT  02/28/2020   Procedure: HEMOSTASIS CLIP PLACEMENT;  Surgeon: Normie Becton., MD;  Location: [facility] ENDOSCOPY;  Service: Gastroenterology;;   HEMOSTASIS CLIP PLACEMENT  06/11/2022   Procedure: HEMOSTASIS CLIP PLACEMENT;  Surgeon: Normie Becton., MD;  Location: [facility] ENDOSCOPY;  Service: Gastroenterology;;   HEMOSTASIS CONTROL  02/28/2020   Procedure: HEMOSTASIS CONTROL;  Surgeon: Normie Becton., MD;  Location: [facility] ENDOSCOPY;  Service: Gastroenterology;;   POLYPECTOMY     POLYPECTOMY  04/04/2019   Procedure: POLYPECTOMY;  Surgeon: Sergio Dandy, MD;  Location: [facility] ENDOSCOPY;  Service: Endoscopy;;   POLYPECTOMY  06/15/2019   Procedure: POLYPECTOMY;  Surgeon: Normie Becton., MD;  Location: [facility] ENDOSCOPY;  Service: Gastroenterology;;   POLYPECTOMY  06/11/2022   Procedure: POLYPECTOMY;  Surgeon: Normie Becton., MD;  Location: [facility] ENDOSCOPY;  Service: Gastroenterology;;   SKIN CANCER EXCISION     SUBMUCOSAL LIFTING INJECTION  06/15/2019   Procedure: SUBMUCOSAL LIFTING INJECTION;  Surgeon: Normie Becton., MD;  Location: [facility] ENDOSCOPY;  Service: Gastroenterology;;   SUBMUCOSAL LIFTING INJECTION  06/11/2022   Procedure: SUBMUCOSAL LIFTING INJECTION;  Surgeon: Normie Becton., MD;  Location: [facility] ENDOSCOPY;  Service: Gastroenterology;;   TUBAL LIGATION     UPPER GI ENDOSCOPY     WISDOM TOOTH EXTRACTION      Current Outpatient Medications  Medication Sig Dispense Refill   acetaminophen  (TYLENOL ) 500 MG  tablet Take 500-1,000 mg by mouth every 6 (six) hours as needed for moderate pain (pain).      albuterol  (VENTOLIN  HFA) 108 (90 Base) MCG/ACT inhaler Inhale 2 puffs into the lungs every 6 (six) hours as needed for wheezing or shortness of breath. 8 g 11   amLODipine  (NORVASC ) 5 MG tablet Take 5 mg by mouth daily.     aspirin  EC 81 MG tablet Take 1 tablet (81 mg total) by mouth daily. Swallow whole.     atorvastatin  (LIPITOR) 20 MG tablet Take 20 mg by mouth daily.     busPIRone  (BUSPAR ) 10 MG tablet Take 10 mg by mouth 2 (two) times daily.     carvedilol  (COREG ) 3.125 MG tablet Take 1 tablet (3.125 mg total) by mouth 2 (two) times daily with a meal. 180 tablet 2   Cholecalciferol (VITAMIN D3) 2000 units capsule Take 1 capsule (2,000 Units total) by mouth daily. 90 capsule 0   clopidogrel  (PLAVIX ) 75 MG tablet TAKE 1 TABLET(75 MG) BY MOUTH DAILY WITH BREAKFAST 90 tablet 2   dapagliflozin  propanediol (FARXIGA ) 10 MG TABS tablet Take 1 tablet (10 mg total) by mouth daily before breakfast. 90 tablet 3   escitalopram  (LEXAPRO ) 20 MG tablet Take 1 tablet (  20 mg total) by mouth daily. 30 tablet 0   fluticasone  (FLONASE ) 50 MCG/ACT nasal spray Place 1-2 sprays into both nostrils daily as needed for allergies or rhinitis.     lamoTRIgine  (LAMICTAL ) 200 MG tablet Take 1 tablet (200 mg total) by mouth 2 (two) times daily. 180 tablet 3   lamoTRIgine  (LAMICTAL ) 25 MG tablet Take 2 tablets (50 mg total) by mouth 2 (two) times daily. 360 tablet 3   montelukast  (SINGULAIR ) 10 MG tablet Take 10 mg by mouth at bedtime.     Oxcarbazepine  (TRILEPTAL ) 300 MG tablet Take 1 tablet (300 mg total) by mouth 2 (two) times daily. 180 tablet 3   pantoprazole  (PROTONIX ) 40 MG tablet Take 1 tablet by mouth daily.     Spacer/Aero-Holding Chambers DEVI 2 Inhalers by Does not apply route in the morning and at bedtime. 1 each 1   spironolactone  (ALDACTONE ) 25 MG tablet TAKE 1/2 TABLET(12.5 MG) BY MOUTH DAILY 45 tablet 2   valsartan   (DIOVAN ) 160 MG tablet Take 320 mg by mouth daily.     No current facility-administered medications for this visit.    Allergies  Allergen Reactions   Zonisamide  Anxiety    Panic Attacks   Aspirin  Hives    Upset stomach   Ciprofloxacin     Seizures & it keeps her awake   Codeine Hives   Hydrocodone Hives   Oxycodone Hives   Penicillins Hives and Swelling    Pt tolerated ampicillin /sulbactam without issues 06/2020      Review of Systems negative except from HPI and PMH  Physical Exam  BP 129/60   Pulse 65   Ht 5' 0.5" (1.537 m)   Wt 205 lb 9.6 oz (93.3 kg)   LMP  (LMP Unknown)   SpO2 96%   BMI 39.49 kg/m  Well developed and well nourished in no acute distress HENT normal Neck supple with JVP-flat Clear Device pocket well healed; without hematoma or erythema.  There is no tethering  Regular rate and rhythm, no  gallop No  murmur Abd-soft with active BS No Clubbing cyanosis TR edema Skin-warm and dry A & Oriented  Grossly normal sensory and motor function  ECG SINUS WITH P-synchronous/ AV  pacing with a functional AR interval of about 260 ms  Device function is normal. Programming changes rate response was activated and AV delays were shortened See Paceart for details    Assessment and  Plan  Complete heart block  Pacemaker. Medtronic   Cardiomyopathy mild   Seizures  Hypertension  Weight gain  Sleep apnea-untreated  Fatigue  Acute/chronic renal injury class IIIb   Blood pressure is well-controlled.  Will continue Aldactone  and Diovan  amlodipine .  Renal function is elevated Estimated Creatinine Clearance: 36.8 mL/min (A) (by C-G formula based on SCr of 1.59 mg/dL (H)).  We will recheck her creatinine today.  Given her fatigue, we will check a TSH, but I suspect this relates largely to her untreated sleep apnea, her CPAP machine have not broken.  Sleep doctor has retired.  Will refer to Dr. Micael Adas.  TSH may also help inform about weight gain.  Not  significantly volume overloaded.  No new medications.  100% ventricular pacing.  Left ventricular function is relatively stable.  Will need to follow this carefully.

## 2023-09-30 ENCOUNTER — Ambulatory Visit (INDEPENDENT_AMBULATORY_CARE_PROVIDER_SITE_OTHER): Payer: Medicare HMO

## 2023-09-30 DIAGNOSIS — E039 Hypothyroidism, unspecified: Secondary | ICD-10-CM | POA: Diagnosis not present

## 2023-09-30 DIAGNOSIS — I442 Atrioventricular block, complete: Secondary | ICD-10-CM | POA: Diagnosis not present

## 2023-09-30 DIAGNOSIS — Z79899 Other long term (current) drug therapy: Secondary | ICD-10-CM | POA: Diagnosis not present

## 2023-09-30 DIAGNOSIS — I5022 Chronic systolic (congestive) heart failure: Secondary | ICD-10-CM | POA: Diagnosis not present

## 2023-09-30 LAB — CUP PACEART REMOTE DEVICE CHECK
Battery Remaining Longevity: 33 mo
Battery Voltage: 2.94 V
Brady Statistic AP VP Percent: 43.54 %
Brady Statistic AP VS Percent: 0 %
Brady Statistic AS VP Percent: 56.46 %
Brady Statistic AS VS Percent: 0 %
Brady Statistic RA Percent Paced: 43.52 %
Brady Statistic RV Percent Paced: 100 %
Date Time Interrogation Session: 20250424142149
Implantable Lead Connection Status: 753985
Implantable Lead Connection Status: 753985
Implantable Lead Implant Date: 20160707
Implantable Lead Implant Date: 20160707
Implantable Lead Location: 753859
Implantable Lead Location: 753860
Implantable Lead Model: 5076
Implantable Lead Model: 5076
Implantable Pulse Generator Implant Date: 20160707
Lead Channel Impedance Value: 361 Ohm
Lead Channel Impedance Value: 380 Ohm
Lead Channel Impedance Value: 760 Ohm
Lead Channel Impedance Value: 798 Ohm
Lead Channel Pacing Threshold Amplitude: 0.5 V
Lead Channel Pacing Threshold Amplitude: 0.5 V
Lead Channel Pacing Threshold Pulse Width: 0.4 ms
Lead Channel Pacing Threshold Pulse Width: 0.4 ms
Lead Channel Sensing Intrinsic Amplitude: 27.5 mV
Lead Channel Sensing Intrinsic Amplitude: 27.5 mV
Lead Channel Sensing Intrinsic Amplitude: 3.25 mV
Lead Channel Sensing Intrinsic Amplitude: 3.25 mV
Lead Channel Setting Pacing Amplitude: 1.5 V
Lead Channel Setting Pacing Amplitude: 2.5 V
Lead Channel Setting Pacing Pulse Width: 0.4 ms
Lead Channel Setting Sensing Sensitivity: 1.2 mV
Zone Setting Status: 755011
Zone Setting Status: 755011

## 2023-09-30 LAB — BASIC METABOLIC PANEL WITH GFR
BUN/Creatinine Ratio: 6 — ABNORMAL LOW (ref 12–28)
BUN: 9 mg/dL (ref 8–27)
CO2: 20 mmol/L (ref 20–29)
Calcium: 9.5 mg/dL (ref 8.7–10.3)
Chloride: 104 mmol/L (ref 96–106)
Creatinine, Ser: 1.42 mg/dL — ABNORMAL HIGH (ref 0.57–1.00)
Glucose: 123 mg/dL — ABNORMAL HIGH (ref 70–99)
Potassium: 4.3 mmol/L (ref 3.5–5.2)
Sodium: 141 mmol/L (ref 134–144)
eGFR: 41 mL/min/1.73 — ABNORMAL LOW (ref >59)

## 2023-09-30 LAB — TSH: TSH: 1.78 u[IU]/mL (ref 0.450–4.500)

## 2023-10-05 ENCOUNTER — Telehealth: Payer: Self-pay | Admitting: *Deleted

## 2023-10-05 NOTE — Telephone Encounter (Signed)
 Prior Authorization for HST sent to [employer] via Phone. Reference # NO PA REQ.

## 2023-10-05 NOTE — Telephone Encounter (Signed)
-----   Message from Nurse Trenia Fritter R sent at 10/05/2023 12:45 PM EDT ----- Regarding: sleep test Please precert and schedule.  Thank You,  Barbie Boon

## 2023-10-05 NOTE — Addendum Note (Signed)
 Addended by: Arva Lathe on: 10/05/2023 12:45 PM   Modules accepted: Orders

## 2023-10-10 ENCOUNTER — Encounter: Payer: Self-pay | Admitting: Cardiology

## 2023-10-10 LAB — CUP PACEART INCLINIC DEVICE CHECK
Date Time Interrogation Session: 20250423164736
Implantable Lead Connection Status: 753985
Implantable Lead Connection Status: 753985
Implantable Lead Implant Date: 20160707
Implantable Lead Implant Date: 20160707
Implantable Lead Location: 753859
Implantable Lead Location: 753860
Implantable Lead Model: 5076
Implantable Lead Model: 5076
Implantable Pulse Generator Implant Date: 20160707

## 2023-10-14 DIAGNOSIS — G4733 Obstructive sleep apnea (adult) (pediatric): Secondary | ICD-10-CM | POA: Diagnosis not present

## 2023-11-06 DIAGNOSIS — I428 Other cardiomyopathies: Secondary | ICD-10-CM | POA: Diagnosis not present

## 2023-11-06 DIAGNOSIS — N1832 Chronic kidney disease, stage 3b: Secondary | ICD-10-CM | POA: Diagnosis not present

## 2023-11-06 DIAGNOSIS — I10 Essential (primary) hypertension: Secondary | ICD-10-CM | POA: Diagnosis not present

## 2023-11-06 DIAGNOSIS — I509 Heart failure, unspecified: Secondary | ICD-10-CM | POA: Diagnosis not present

## 2023-11-10 NOTE — Progress Notes (Signed)
 Remote pacemaker transmission.

## 2023-11-11 DIAGNOSIS — E7800 Pure hypercholesterolemia, unspecified: Secondary | ICD-10-CM | POA: Insufficient documentation

## 2023-11-11 NOTE — Progress Notes (Deleted)
 {This patient may be at risk for Amyloid. She has one or more dx on the prob list or PMH from the following list -  Abnormal EKG, HFpEF/Diastolic CHF, Aortic Stenosis, LVH, Bilateral Carpal Tunnel Syndrome, Biceps Tendon Rupture, Spinal Stenosis, Pericardial Effusion, Left Atrial Enlargement, Conduction System Disorder. See list below or review PMH.  Diagnoses From Problem List           Noted     Heart failure with mildly reduced ejection fraction (HFmrEF) (HCC) 06/17/2022    Click HERE to open Cardiac Amyloid Screening SmartSet to order screening OR Click HERE to defer testing for 1 year or permanently :1}    OFFICE NOTE Date:  11/11/2023  ID:  [name] [name], DOB 1960/12/26, MRN [id number] PCP: Dickey Fought, PA  [facility] Providers Cardiologist:  None Cardiology APP:  Gabino Joe, PA-C  Electrophysiologist:  Richardo Chandler, MD  Electrophysiology APP:  Debbie Fails, PA-C { Click to update primary MD,subspecialty MD or APP then REFRESH:1}    Patient Profile:    *** HFmrEF (heart failure with mildly reduced ejection fraction) Dx during admx for pneumonia in 06/2020 - prob stress related TTE 06/21/20: EF 40-45 Limited TTE 09/18/20: EF 55 TTE 04/02/22: EF 45-50, global HK, Gr 1 DD, NL RVSF, NL PASP (RVSP 33.9), mild LAE, RAP 3 TTE 03/18/23: EF 50-55, no RWMA, Gr 1 DD, NL RVSF, NL PASP, RVSP 26.2, RAP 3  Left Bundle Branch Block Complete Heart Block s/p Pacemaker 2016 Peripheral arterial disease  ABIs/LEA US  12/03/2022: B/L EIA >50, mild atherosclerosis throughout // ABIs-R 0.97, L 0.62  S/p L CIA stent in 01/2023  ABIs 09/28/23: R 0.91, L 0.91 Hypertension  Chronic kidney disease stage 3 (eGFR 57) OSA Seizure d/o Chronic cough (followed by pulmonology - Dr. Marygrace Snellen) Myoview 10/23/14: no ischemia, low risk EF 58        Discussed the use of AI scribe software for clinical note transcription with the patient, who gave verbal consent to proceed. History of Present  Illness [name] [name] is a 63 y.o. female who returns for follow up of CHF. She was last seen in 10/2022. Since last seen, she was referred to Dr. Katheryne Pane for peripheral arterial disease. She underwent L CIA stenting in 01/2023. She last saw EP (Dr. Rodolfo Clan in 09/2023).     ROS-See HPI***    Studies Reviewed:      *** Results  Risk Assessment/Calculations: {Does this patient have ATRIAL FIBRILLATION?[phone]} No BP recorded.  {Refresh Note OR Click here to enter BP  :1}***      Physical Exam:  VS:  LMP  (LMP Unknown)    Wt Readings from Last 3 Encounters:  09/29/23 205 lb 9.6 oz (93.3 kg)  09/15/23 206 lb (93.4 kg)  09/09/23 204 lb (92.5 kg)    Physical Exam***     Assessment and Plan: Assessment & Plan Heart failure with mildly reduced ejection fraction (HFmrEF) (HCC)  PAD (peripheral artery disease) (HCC)  Pure hypercholesterolemia  Essential hypertension  Assessment and Plan Assessment & Plan    {Heart failure with mildly reduced ejection fraction (HFmrEF) (HCC) EF ~ 50. She is NYHA II. Volume status stable.  We discussed the "4 pillars" of heart failure management.  She is already on beta-blocker, ARB and MRA.  We discussed the benefits of SGLT2 inhibitors.  We also discussed the potential for GU side effects. Continue valsartan  320 mg daily, Coreg  3.125 mg twice daily, spironolactone  12.5 mg daily Start  Farxiga  10 mg daily BMET in 3 weeks If blood pressure difficult to control, consider changing valsartan  to Entresto Follow-up in October as planned with the EP Obtain echocardiogram prior to next visit with the EP in October Follow-up with me in April 2025   Complete heart block [facility]) S/p Pacemaker. F/u with EP as planned.    Essential hypertension Blood pressure well-controlled.  Continue amlodipine  5 mg daily, carvedilol  3.25 mg twice daily, valsartan  320 mg daily, spironolactone  12.5 mg daily.   Claudication [facility]) She notes left thigh discomfort with  ambulation that improves with rest.  She does not have any symptoms of critical limb ischemia.  She is a smoker.  Obtain ABIs and left lower extremity arterial Dopplers.      :1}    {Are you ordering a CV Procedure (e.g. stress test, cath, DCCV, TEE, etc)?   Press F2        :[id number]}  Dispo:  No follow-ups on file. Signed, Marlyse Single, PA-C

## 2023-11-12 ENCOUNTER — Ambulatory Visit: Admitting: Physician Assistant

## 2023-11-12 DIAGNOSIS — I10 Essential (primary) hypertension: Secondary | ICD-10-CM

## 2023-11-12 DIAGNOSIS — E7800 Pure hypercholesterolemia, unspecified: Secondary | ICD-10-CM

## 2023-11-12 DIAGNOSIS — I5022 Chronic systolic (congestive) heart failure: Secondary | ICD-10-CM

## 2023-11-12 DIAGNOSIS — I739 Peripheral vascular disease, unspecified: Secondary | ICD-10-CM

## 2023-11-14 DIAGNOSIS — G4733 Obstructive sleep apnea (adult) (pediatric): Secondary | ICD-10-CM | POA: Diagnosis not present

## 2023-11-16 ENCOUNTER — Ambulatory Visit (HOSPITAL_BASED_OUTPATIENT_CLINIC_OR_DEPARTMENT_OTHER): Admitting: Cardiology

## 2023-11-24 ENCOUNTER — Ambulatory Visit (HOSPITAL_BASED_OUTPATIENT_CLINIC_OR_DEPARTMENT_OTHER): Attending: Internal Medicine | Admitting: Cardiology

## 2023-11-24 ENCOUNTER — Encounter (HOSPITAL_BASED_OUTPATIENT_CLINIC_OR_DEPARTMENT_OTHER): Payer: Self-pay

## 2023-11-25 ENCOUNTER — Other Ambulatory Visit: Payer: Self-pay | Admitting: Physician Assistant

## 2023-12-13 ENCOUNTER — Other Ambulatory Visit (HOSPITAL_COMMUNITY): Payer: Self-pay

## 2023-12-13 ENCOUNTER — Telehealth: Payer: Self-pay

## 2023-12-13 NOTE — Telephone Encounter (Signed)
 Pharmacy Patient Advocate Encounter   Received notification from Patient Pharmacy that prior authorization for FARXIGA  is required/requested.   Insurance verification completed.   The patient is insured through [employer] .   Per test claim: Refill too soon. PA is not needed at this time. Medication was filled 11/30/23. Next eligible fill date is 02/06/24.

## 2023-12-14 DIAGNOSIS — G4733 Obstructive sleep apnea (adult) (pediatric): Secondary | ICD-10-CM | POA: Diagnosis not present

## 2023-12-30 ENCOUNTER — Ambulatory Visit (INDEPENDENT_AMBULATORY_CARE_PROVIDER_SITE_OTHER): Payer: Medicare HMO

## 2023-12-30 ENCOUNTER — Ambulatory Visit: Payer: Self-pay | Admitting: Cardiology

## 2023-12-30 DIAGNOSIS — I428 Other cardiomyopathies: Secondary | ICD-10-CM

## 2023-12-30 LAB — CUP PACEART REMOTE DEVICE CHECK
Battery Remaining Longevity: 30 mo
Battery Voltage: 2.93 V
Brady Statistic AP VP Percent: 40.08 %
Brady Statistic AP VS Percent: 0 %
Brady Statistic AS VP Percent: 59.92 %
Brady Statistic AS VS Percent: 0 %
Brady Statistic RA Percent Paced: 40.06 %
Brady Statistic RV Percent Paced: 100 %
Date Time Interrogation Session: 20250723120428
Implantable Lead Connection Status: 753985
Implantable Lead Connection Status: 753985
Implantable Lead Implant Date: 20160707
Implantable Lead Implant Date: 20160707
Implantable Lead Location: 753859
Implantable Lead Location: 753860
Implantable Lead Model: 5076
Implantable Lead Model: 5076
Implantable Pulse Generator Implant Date: 20160707
Lead Channel Impedance Value: 380 Ohm
Lead Channel Impedance Value: 399 Ohm
Lead Channel Impedance Value: 817 Ohm
Lead Channel Impedance Value: 817 Ohm
Lead Channel Pacing Threshold Amplitude: 0.375 V
Lead Channel Pacing Threshold Amplitude: 0.5 V
Lead Channel Pacing Threshold Pulse Width: 0.4 ms
Lead Channel Pacing Threshold Pulse Width: 0.4 ms
Lead Channel Sensing Intrinsic Amplitude: 27.5 mV
Lead Channel Sensing Intrinsic Amplitude: 27.5 mV
Lead Channel Sensing Intrinsic Amplitude: 3.5 mV
Lead Channel Sensing Intrinsic Amplitude: 3.5 mV
Lead Channel Setting Pacing Amplitude: 1.5 V
Lead Channel Setting Pacing Amplitude: 2.5 V
Lead Channel Setting Pacing Pulse Width: 0.4 ms
Lead Channel Setting Sensing Sensitivity: 1.2 mV
Zone Setting Status: 755011
Zone Setting Status: 755011

## 2024-01-06 DIAGNOSIS — I10 Essential (primary) hypertension: Secondary | ICD-10-CM | POA: Diagnosis not present

## 2024-01-06 DIAGNOSIS — N1832 Chronic kidney disease, stage 3b: Secondary | ICD-10-CM | POA: Diagnosis not present

## 2024-01-06 DIAGNOSIS — I428 Other cardiomyopathies: Secondary | ICD-10-CM | POA: Diagnosis not present

## 2024-01-06 DIAGNOSIS — I509 Heart failure, unspecified: Secondary | ICD-10-CM | POA: Diagnosis not present

## 2024-01-14 DIAGNOSIS — G4733 Obstructive sleep apnea (adult) (pediatric): Secondary | ICD-10-CM | POA: Diagnosis not present

## 2024-01-19 ENCOUNTER — Ambulatory Visit: Attending: Cardiovascular Disease | Admitting: Cardiovascular Disease

## 2024-02-14 DIAGNOSIS — G4733 Obstructive sleep apnea (adult) (pediatric): Secondary | ICD-10-CM | POA: Diagnosis not present

## 2024-02-20 ENCOUNTER — Other Ambulatory Visit: Payer: Self-pay

## 2024-02-20 ENCOUNTER — Emergency Department (HOSPITAL_COMMUNITY)
Admission: EM | Admit: 2024-02-20 | Discharge: 2024-02-20 | Disposition: A | Discharge status: Home / Self Care | Attending: Emergency Medicine | Admitting: Emergency Medicine

## 2024-02-20 ENCOUNTER — Emergency Department (HOSPITAL_COMMUNITY)

## 2024-02-20 DIAGNOSIS — Z950 Presence of cardiac pacemaker: Secondary | ICD-10-CM | POA: Insufficient documentation

## 2024-02-20 DIAGNOSIS — F17210 Nicotine dependence, cigarettes, uncomplicated: Secondary | ICD-10-CM | POA: Diagnosis not present

## 2024-02-20 DIAGNOSIS — Z7902 Long term (current) use of antithrombotics/antiplatelets: Secondary | ICD-10-CM | POA: Diagnosis not present

## 2024-02-20 DIAGNOSIS — I509 Heart failure, unspecified: Secondary | ICD-10-CM | POA: Insufficient documentation

## 2024-02-20 DIAGNOSIS — J40 Bronchitis, not specified as acute or chronic: Secondary | ICD-10-CM | POA: Insufficient documentation

## 2024-02-20 DIAGNOSIS — R059 Cough, unspecified: Secondary | ICD-10-CM | POA: Diagnosis not present

## 2024-02-20 DIAGNOSIS — J449 Chronic obstructive pulmonary disease, unspecified: Secondary | ICD-10-CM | POA: Diagnosis not present

## 2024-02-20 DIAGNOSIS — Z7951 Long term (current) use of inhaled steroids: Secondary | ICD-10-CM | POA: Insufficient documentation

## 2024-02-20 DIAGNOSIS — R0602 Shortness of breath: Secondary | ICD-10-CM

## 2024-02-20 DIAGNOSIS — Z7982 Long term (current) use of aspirin: Secondary | ICD-10-CM | POA: Diagnosis not present

## 2024-02-20 DIAGNOSIS — R062 Wheezing: Secondary | ICD-10-CM | POA: Diagnosis not present

## 2024-02-20 DIAGNOSIS — Z743 Need for continuous supervision: Secondary | ICD-10-CM | POA: Diagnosis not present

## 2024-02-20 DIAGNOSIS — J189 Pneumonia, unspecified organism: Secondary | ICD-10-CM | POA: Diagnosis not present

## 2024-02-20 LAB — CBC
HCT: 43.4 % (ref 36.0–46.0)
Hemoglobin: 14.1 g/dL (ref 12.0–15.0)
MCH: 32.5 pg (ref 26.0–34.0)
MCHC: 32.5 g/dL (ref 30.0–36.0)
MCV: 100 fL (ref 80.0–100.0)
Platelets: 263 K/uL (ref 150–400)
RBC: 4.34 MIL/uL (ref 3.87–5.11)
RDW: 14.8 % (ref 11.5–15.5)
WBC: 10.3 K/uL (ref 4.0–10.5)
nRBC: 0 % (ref 0.0–0.2)

## 2024-02-20 LAB — RESP PANEL BY RT-PCR (RSV, FLU A&B, COVID)  RVPGX2
Influenza A by PCR: NEGATIVE
Influenza B by PCR: NEGATIVE
Resp Syncytial Virus by PCR: NEGATIVE
SARS Coronavirus 2 by RT PCR: NEGATIVE

## 2024-02-20 LAB — BASIC METABOLIC PANEL WITH GFR
Anion gap: 13 (ref 5–15)
BUN: 8 mg/dL (ref 8–23)
CO2: 21 mmol/L — ABNORMAL LOW (ref 22–32)
Calcium: 9.4 mg/dL (ref 8.9–10.3)
Chloride: 105 mmol/L (ref 98–111)
Creatinine, Ser: 1.24 mg/dL — ABNORMAL HIGH (ref 0.44–1.00)
GFR, Estimated: 48 mL/min — ABNORMAL LOW (ref >60)
Glucose, Bld: 103 mg/dL — ABNORMAL HIGH (ref 70–99)
Potassium: 3.8 mmol/L (ref 3.5–5.1)
Sodium: 139 mmol/L (ref 135–145)

## 2024-02-20 LAB — TROPONIN T, HIGH SENSITIVITY: Troponin T High Sensitivity: <15 ng/L (ref 0–19)

## 2024-02-20 LAB — PRO BRAIN NATRIURETIC PEPTIDE: Pro Brain Natriuretic Peptide: 105 pg/mL (ref <300.0)

## 2024-02-20 MED ORDER — IPRATROPIUM-ALBUTEROL 0.5-2.5 (3) MG/3ML IN SOLN
3.0000 mL | Freq: Once | RESPIRATORY_TRACT | Status: AC
  Administered 2024-02-20: 3 mL via RESPIRATORY_TRACT
  Filled 2024-02-20: qty 3

## 2024-02-20 MED ORDER — PREDNISONE 20 MG PO TABS: 40.0000 mg | ORAL_TABLET | Freq: Every day | ORAL | 0 refills | Status: AC

## 2024-02-20 MED ORDER — MAGNESIUM SULFATE 2 GM/50ML IV SOLN
2.0000 g | Freq: Once | INTRAVENOUS | Status: AC
  Administered 2024-02-20: 2 g via INTRAVENOUS
  Filled 2024-02-20: qty 50

## 2024-02-20 MED ORDER — ALBUTEROL SULFATE HFA 108 (90 BASE) MCG/ACT IN AERS: 1.0000 | INHALATION_SPRAY | Freq: Four times a day (QID) | RESPIRATORY_TRACT | 0 refills | Status: AC | PRN

## 2024-02-20 NOTE — ED Provider Notes (Signed)
 [place] EMERGENCY DEPARTMENT AT [facility] Provider Note   CSN: [id number] Arrival date & time: 02/20/24  9275     Patient presents with: Shortness of Breath   [name] [name] is a 63 y.o. female past medical history seen for tobacco abuse, complete heart block status post pacemaker, heart failure with mildly reduced ejection fraction, peripheral arterial disease who presents with concern for shortness of breath with exertion, cough for 3 days.  Sister just diagnosed with bronchitis.  She reports history of bronchitis and pneumonia.  She denies history of COPD.  She does report that she still occasionally smokes cigarettes.  She reports that she feels significantly better after 2 DuoNebs, 125 mg Solu-Medrol  prior to arrival with EMS.  Denies any ongoing chest pain.    Shortness of Breath      Prior to Admission medications   Medication Sig Start Date End Date Taking? Authorizing Provider  albuterol  (VENTOLIN  HFA) 108 (90 Base) MCG/ACT inhaler Inhale 1-2 puffs into the lungs every 6 (six) hours as needed for wheezing or shortness of breath. 02/20/24  Yes Kirstin Kugler H, PA-C  predniSONE  (DELTASONE ) 20 MG tablet Take 2 tablets (40 mg total) by mouth daily. 02/20/24  Yes Mashelle Busick H, PA-C  acetaminophen  (TYLENOL ) 500 MG tablet Take 500-1,000 mg by mouth every 6 (six) hours as needed for moderate pain (pain).     [provider]  amLODipine  (NORVASC ) 5 MG tablet Take 5 mg by mouth daily. 06/02/22   [provider]  aspirin  EC 81 MG tablet Take 1 tablet (81 mg total) by mouth daily. Swallow whole. 01/09/23   Meng, Hao, PA  atorvastatin  (LIPITOR) 20 MG tablet Take 20 mg by mouth daily. 04/01/20   [provider]  busPIRone  (BUSPAR ) 10 MG tablet Take 10 mg by mouth 2 (two) times daily.    [provider]  carvedilol  (COREG ) 3.125 MG tablet Take 1 tablet (3.125 mg total) by mouth 2 (two) times daily with a meal. 05/17/23   Leverne Charlies Helling, PA-C  Cholecalciferol (VITAMIN D3) 2000 units capsule Take 1 capsule (2,000 Units total) by mouth daily. 03/17/16   Calone, Gregory D, FNP  clopidogrel  (PLAVIX ) 75 MG tablet TAKE 1 TABLET(75 MG) BY MOUTH DAILY WITH BREAKFAST 07/01/23   Court Dorn PARAS, MD  dapagliflozin  propanediol (FARXIGA ) 10 MG TABS tablet TAKE 1 TABLET(10 MG) BY MOUTH DAILY BEFORE BREAKFAST 11/26/23   Fernande Elspeth BROCKS, MD  escitalopram  (LEXAPRO ) 20 MG tablet Take 1 tablet (20 mg total) by mouth daily. 12/31/21   Hunsucker, Donnice SAUNDERS, MD  fluticasone  (FLONASE ) 50 MCG/ACT nasal spray Place 1-2 sprays into both nostrils daily as needed for allergies or rhinitis.    [provider]  lamoTRIgine  (LAMICTAL ) 200 MG tablet Take 1 tablet (200 mg total) by mouth 2 (two) times daily. 09/09/23   Gayland Lauraine PARAS, NP  lamoTRIgine  (LAMICTAL ) 25 MG tablet Take 2 tablets (50 mg total) by mouth 2 (two) times daily. 09/09/23   Gayland Lauraine PARAS, NP  montelukast  (SINGULAIR ) 10 MG tablet Take 10 mg by mouth at bedtime. 05/14/21   [provider]  Oxcarbazepine  (TRILEPTAL ) 300 MG tablet Take 1 tablet (300 mg total) by mouth 2 (two) times daily. 09/09/23   Gayland Lauraine PARAS, NP  pantoprazole  (PROTONIX ) 40 MG tablet Take 1 tablet by mouth daily.    [provider]  Spacer/Aero-Holding Chambers DEVI 2 Inhalers by Does not apply route in the morning and at bedtime. 09/23/21  Hunsucker, Donnice SAUNDERS, MD  spironolactone  (ALDACTONE ) 25 MG tablet TAKE 1/2 TABLET(12.5 MG) BY MOUTH DAILY 06/15/23   Lelon Hamilton T, PA-C  valsartan  (DIOVAN ) 160 MG tablet Take 320 mg by mouth daily. 10/17/22   [provider]    Allergies: Zonisamide , Aspirin , Ciprofloxacin, Codeine, Hydrocodone, Oxycodone, and Penicillins    Review of Systems  Respiratory:  Positive for shortness of breath.   All other systems reviewed and are negative.   Updated Vital Signs BP (!) 139/108   Pulse 71   Temp 98.5 F (36.9 C) (Oral)   Resp 15   LMP  (LMP  Unknown)   SpO2 93%   Physical Exam Vitals and nursing note reviewed.  Constitutional:      General: She is not in acute distress.    Appearance: Normal appearance.  HENT:     Head: Normocephalic and atraumatic.  Eyes:     General:        Right eye: No discharge.        Left eye: No discharge.  Cardiovascular:     Rate and Rhythm: Normal rate and regular rhythm.     Heart sounds: No murmur heard.    No friction rub. No gallop.  Pulmonary:     Effort: Pulmonary effort is normal.     Breath sounds: Normal breath sounds.     Comments: Coarse rhonchi on expiration in bilateral lung fields, no significant wheezing, no tachypnea, no respiratory distress. Abdominal:     General: Bowel sounds are normal.     Palpations: Abdomen is soft.  Skin:    General: Skin is warm and dry.     Capillary Refill: Capillary refill takes less than 2 seconds.  Neurological:     Mental Status: She is alert and oriented to person, place, and time.  Psychiatric:        Mood and Affect: Mood normal.        Behavior: Behavior normal.     (all labs ordered are listed, but only abnormal results are displayed) Labs Reviewed  BASIC METABOLIC PANEL WITH GFR - Abnormal; Notable for the following components:      Result Value   CO2 21 (*)    Glucose, Bld 103 (*)    Creatinine, Ser 1.24 (*)    GFR, Estimated 48 (*)    All other components within normal limits  RESP PANEL BY RT-PCR (RSV, FLU A&B, COVID)  RVPGX2  CBC  PRO BRAIN NATRIURETIC PEPTIDE  TROPONIN T, HIGH SENSITIVITY    EKG: EKG Interpretation Date/Time:  Sunday February 20 2024 08:23:25 EDT Ventricular Rate:  71 PR Interval:    QRS Duration:  178 QT Interval:  489 QTC Calculation: 532 R Axis:   -87  Text Interpretation: VENTRICULAR PACED RHYTHM Confirmed by Francesca Fallow ([id number]) on 02/20/2024 10:57:06 AM  Radiology: [facility] Chest 2 View Result Date: 02/20/2024 EXAM: 2 VIEW(S) XRAY OF THE CHEST 02/20/2024 08:09:26 AM COMPARISON:  05/07/2022 CLINICAL HISTORY: Per chart - PT BIBA from home for shob with exertion and a cough x3 days. Sister just dx with bronchitis. Hx bronchitis and pna. Rhonchi in bilat lower, wheezing in uppers. Hx COPD. FINDINGS: LINES, TUBES AND DEVICES: Left subclavian dual lead pacemaker in place. LUNGS AND PLEURA: No focal pulmonary opacity. No pulmonary edema. No pleural effusion. No pneumothorax. HEART AND MEDIASTINUM: No acute abnormality of the cardiac and mediastinal silhouettes. BONES AND SOFT TISSUES: No acute osseous abnormality. IMPRESSION: 1. No acute process. Electronically signed by: Taylor Stroud  MD 02/20/2024 08:20 AM EDT RP Workstation: [id number]     Procedures   Medications Ordered in the ED  magnesium  sulfate IVPB 2 g 50 mL (0 g Intravenous Stopped 02/20/24 0928)  ipratropium-albuterol  (DUONEB) 0.5-2.5 (3) MG/3ML nebulizer solution 3 mL (3 mLs Nebulization Given 02/20/24 9175)                                    Medical Decision Making Amount and/or Complexity of Data Reviewed Labs: ordered. Radiology: ordered.  Risk Prescription drug management.   This patient is a 63 y.o. female  who presents to the ED for concern of shortness of breath, cough.   Differential diagnoses prior to evaluation: The emergent differential diagnosis includes, but is not limited to,  asthma exacerbation, COPD exacerbation, acute upper respiratory infection, acute bronchitis, chronic bronchitis, interstitial lung disease, ARDS, PE, pneumonia, atypical ACS, carbon monoxide poisoning, spontaneous pneumothorax, new CHF vs CHF exacerbation, versus other  . This is not an exhaustive differential.   Past Medical History / Co-morbidities / Social History: tobacco abuse, complete heart block status post pacemaker, heart failure with mildly reduced ejection fraction, peripheral arterial disease  Additional history: Chart reviewed. Pertinent results include: Reviewed outpatient cardiology visits  Physical  Exam: Physical exam performed. The pertinent findings include: Some coarse rhonchi in bilateral lung fields on expiration, mild wheezing, no tachypnea, no respiratory distress.  Rhonchi and wheezing improved on reevaluation.  Lab Tests/Imaging studies: I personally interpreted labs/imaging and the pertinent results include: CBC unremarkable, BMP with chronically elevated creatinine 1.24, not significant change from baseline.  Negative troponin, normal BNP, RVP negative for COVID, flu, RSV.  I independently interpreted plain film chest x-ray which shows no evidence of acute intrathoracic abnormality. I agree with the radiologist interpretation.  Cardiac monitoring: EKG obtained and interpreted by myself and attending physician which shows: Ventricular paced rhythm, stable compared to baseline   Medications: I ordered medication including magnesium  sulfate, DuoNeb after 2 DuoNebs, Solu-Medrol  by EMS, on reassessment patient feeling improved, she was able to de-escalate off of oxygen, she is tolerating room air at oxygen saturation of around 90 to 92%.  Discussed with patient that this is on the low side of normal, given her symptoms and vital signs I think would be reasonable to consider hospital admission, but do think that discharge with steroids, inhaler would be appropriate as well if patient would prefer to go home.  Patient prefers home discharge for steroids, inhaler, close follow-up.  I think this is reasonable..  I have reviewed the patients home medicines and have made adjustments as needed.   Disposition: After consideration of the diagnostic results and the patients response to treatment, I feel that patient is stable for discharge with plan as above.   emergency department workup does not suggest an emergent condition requiring admission or immediate intervention beyond what has been performed at this time. The plan is: as above. The patient is safe for discharge and has been instructed to  return immediately for worsening symptoms, change in symptoms or any other concerns.   Final diagnoses:  SOB (shortness of breath)  Bronchitis    ED Discharge Orders          Ordered    predniSONE  (DELTASONE ) 20 MG tablet  Daily        02/20/24 1059    albuterol  (VENTOLIN  HFA) 108 (90 Base) MCG/ACT inhaler  Every 6 hours PRN  02/20/24 1059               Kelina Beauchamp, [place] H, PA-C 02/20/24 1105    Francesca Elsie CROME, MD 02/20/24 1541

## 2024-02-20 NOTE — ED Notes (Signed)
 [place] added at 2lt per O2 sat being 91 on room air

## 2024-02-20 NOTE — Discharge Instructions (Addendum)
 Please take the entire course of steroids that I prescribed, and use your inhaler as needed. Please return if you have worsening shortness of breath despite treatment.

## 2024-02-20 NOTE — ED Triage Notes (Signed)
 PT BIBA from home for shob with exertion and a cough x3 days. Sister just dx with bronchitis. Hx bronchitis and pna. Rhonchi in bilat lower, wheezing in uppers. Hx COPD. 2 duonebs, 125mg  solumedrol. 20ga RH  140/70 92% RA, 100% on duoneb Etco2 35-40 RR 18 Paced rhythm ~80 Cbg 93

## 2024-02-25 ENCOUNTER — Other Ambulatory Visit: Payer: Self-pay | Admitting: Physician Assistant

## 2024-03-06 DIAGNOSIS — Z1231 Encounter for screening mammogram for malignant neoplasm of breast: Secondary | ICD-10-CM | POA: Diagnosis not present

## 2024-03-06 DIAGNOSIS — E782 Mixed hyperlipidemia: Secondary | ICD-10-CM | POA: Diagnosis not present

## 2024-03-08 ENCOUNTER — Ambulatory Visit: Admitting: Cardiovascular Disease

## 2024-03-09 NOTE — Progress Notes (Signed)
 Remote PPM Transmission

## 2024-03-12 ENCOUNTER — Other Ambulatory Visit: Payer: Self-pay | Admitting: Physician Assistant

## 2024-03-12 ENCOUNTER — Other Ambulatory Visit: Payer: Self-pay | Admitting: Cardiovascular Disease

## 2024-03-15 ENCOUNTER — Ambulatory Visit: Admitting: Cardiovascular Disease

## 2024-03-15 DIAGNOSIS — G4733 Obstructive sleep apnea (adult) (pediatric): Secondary | ICD-10-CM | POA: Diagnosis not present

## 2024-03-21 ENCOUNTER — Encounter: Payer: Self-pay | Admitting: Cardiovascular Disease

## 2024-03-21 ENCOUNTER — Ambulatory Visit: Admitting: Cardiovascular Disease

## 2024-03-29 ENCOUNTER — Ambulatory Visit: Attending: Cardiovascular Disease | Admitting: Cardiovascular Disease

## 2024-03-29 ENCOUNTER — Encounter: Payer: Self-pay | Admitting: Cardiovascular Disease

## 2024-03-29 VITALS — BP 162/84 | HR 74 | Ht 62.0 in | Wt 201.6 lb

## 2024-03-29 DIAGNOSIS — I739 Peripheral vascular disease, unspecified: Secondary | ICD-10-CM

## 2024-03-29 DIAGNOSIS — I10 Essential (primary) hypertension: Secondary | ICD-10-CM | POA: Diagnosis not present

## 2024-03-29 NOTE — Patient Instructions (Signed)
 Medication Instructions:  Your physician recommends that you continue on your current medications as directed. Please refer to the Current Medication list given to you today.  *If you need a refill on your cardiac medications before your next appointment, please call your pharmacy*  Testing/Procedures: Your physician has requested that you have an Aorta/Iliac Duplex. This will take place at [address] [facility], 4th floor  No food after 11PM the night before.  Water  is OK. (Don't drink liquids if you have been instructed not to for ANOTHER test) Avoid foods that produce bowel gas, for 24 hours prior to exam (see below). No breakfast, no chewing gum, no smoking or carbonated beverages. Patient may take morning medications with water . Come in for test at least 15 minutes early to register. **To do in April 2026**  Please note: We ask at that you not bring children with you during ultrasound (echo/ vascular) testing. Due to room size and safety concerns, children are not allowed in the ultrasound rooms during exams. Our front office staff cannot provide observation of children in our lobby area while testing is being conducted. An adult accompanying a patient to their appointment will only be allowed in the ultrasound room at the discretion of the ultrasound technician under special circumstances. We apologize for any inconvenience.   Your physician has requested that you have an ankle brachial index (ABI). During this test an ultrasound and blood pressure cuff are used to evaluate the arteries that supply the arms and legs with blood. Allow thirty minutes for this exam. There are no restrictions or special instructions. This will take place at [address], 4th floor  **To do in April 2026**   Please note: We ask at that you not bring children with you during ultrasound (echo/ vascular) testing. Due to room size and safety concerns, children are not allowed in the ultrasound rooms during exams. Our  front office staff cannot provide observation of children in our lobby area while testing is being conducted. An adult accompanying a patient to their appointment will only be allowed in the ultrasound room at the discretion of the ultrasound technician under special circumstances. We apologize for any inconvenience.    Follow-Up: At [facility], you and your health needs are our priority.  As part of our continuing mission to provide you with exceptional heart care, our providers are all part of one team.  This team includes your primary Cardiologist (physician) and Advanced Practice Providers or APPs (Physician Assistants and Nurse Practitioners) who all work together to provide you with the care you need, when you need it.  Your next appointment:   12 month(s)  Provider:   Glendia Ferrier, PA-C          We recommend signing up for the patient portal called MyChart.  Sign up information is provided on this After Visit Summary.  MyChart is used to connect with patients for Virtual Visits (Telemedicine).  Patients are able to view lab/test results, encounter notes, upcoming appointments, etc.  Non-urgent messages can be sent to your provider as well.   To learn more about what you can do with MyChart, go to [email or web address].   Other Instructions We will make you an appointment to see a new EP doctor to follow your pacemaker- Dr. [place] for March.

## 2024-03-29 NOTE — Progress Notes (Unsigned)
 03/29/2024 [name] [name]   1961-02-23  [id number]  Primary Physician Sheldon Netter, PA Primary Cardiologist: Dorn JINNY Lesches MD FACP, [place], [place], FSCAI  HPI:  [name] [name] is a 63 y.o.   moderately overweight divorced Caucasian female with no children who I last saw in the office 09/15/23.  She was referred by Glendia Ferrier, PA-C-see for evaluation of lifestyle-limiting claudication.  She is disabled because of epilepsy.  Her risk factors include 25 pack years of tobacco abuse smoking 1/2 pack/day recalcitrant to risk factor modification.  History of hypertension and hyperlipidemia.  One of her sisters has had coronary intervention with stents.  She is never had a heart attack or stroke.  She denies chest pain or shortness of breath.  She has noticed left greater than right lower extremity claudication for the last several months with recent Doppler studies performed 12/02/2022 revealing a decreased left ABI of 0.62 with a high-frequency signal in her left extrailiac artery.   I performed peripheral angiography of her 01/07/2023 revealing of 70% distal left common iliac artery stenosis with a 60 mmHg gradient.  I performed PTA and stenting using an 8 mm x 40 mm long Abbott absolute Pro nitinol self-expanding stent with an excellent angiographic result.  She was discharged home the following day.  Her Dopplers normalized and her claudication has resolved.  She is on DAPT.   Since I saw her in the office 6 months ago she continues to do well.  She still smokes 1 carton every week and a half.  She denies chest pain, shortness of breath or claudication.  Doppler studies performed/ 422/25 revealed continued patency of her previously placed left external iliac artery stent.   Current Meds  Medication Sig   acetaminophen  (TYLENOL ) 500 MG tablet Take 500-1,000 mg by mouth every 6 (six) hours as needed for moderate pain (pain).    albuterol  (VENTOLIN  HFA) 108 (90 Base) MCG/ACT inhaler Inhale  1-2 puffs into the lungs every 6 (six) hours as needed for wheezing or shortness of breath.   amLODipine  (NORVASC ) 5 MG tablet Take 5 mg by mouth daily.   aspirin  EC 81 MG tablet Take 1 tablet (81 mg total) by mouth daily. Swallow whole.   atorvastatin  (LIPITOR) 20 MG tablet Take 20 mg by mouth daily.   busPIRone  (BUSPAR ) 10 MG tablet Take 10 mg by mouth 2 (two) times daily.   carvedilol  (COREG ) 3.125 MG tablet TAKE 1 TABLET(3.125 MG) BY MOUTH TWICE DAILY WITH A MEAL   Cholecalciferol (VITAMIN D3) 2000 units capsule Take 1 capsule (2,000 Units total) by mouth daily.   clopidogrel  (PLAVIX ) 75 MG tablet TAKE 1 TABLET(75 MG) BY MOUTH DAILY WITH BREAKFAST   dapagliflozin  propanediol (FARXIGA ) 10 MG TABS tablet TAKE 1 TABLET(10 MG) BY MOUTH DAILY BEFORE BREAKFAST   escitalopram  (LEXAPRO ) 20 MG tablet Take 1 tablet (20 mg total) by mouth daily.   fluticasone  (FLONASE ) 50 MCG/ACT nasal spray Place 1-2 sprays into both nostrils daily as needed for allergies or rhinitis.   lamoTRIgine  (LAMICTAL ) 200 MG tablet Take 1 tablet (200 mg total) by mouth 2 (two) times daily.   lamoTRIgine  (LAMICTAL ) 25 MG tablet Take 2 tablets (50 mg total) by mouth 2 (two) times daily.   montelukast  (SINGULAIR ) 10 MG tablet Take 10 mg by mouth at bedtime.   Oxcarbazepine  (TRILEPTAL ) 300 MG tablet Take 1 tablet (300 mg total) by mouth 2 (two) times daily.   pantoprazole  (PROTONIX ) 40 MG tablet Take 1 tablet  by mouth daily.   Spacer/Aero-Holding Chambers DEVI 2 Inhalers by Does not apply route in the morning and at bedtime.   spironolactone  (ALDACTONE ) 25 MG tablet TAKE 1/2 TABLET(12.5 MG) BY MOUTH DAILY   valsartan  (DIOVAN ) 160 MG tablet Take 320 mg by mouth daily.     Allergies  Allergen Reactions   Zonisamide  Anxiety    Panic Attacks   Aspirin  Hives    Upset stomach   Ciprofloxacin     Seizures & it keeps her awake   Codeine Hives   Hydrocodone Hives   Oxycodone Hives   Penicillins Hives and Swelling    Pt  tolerated ampicillin /sulbactam without issues 06/2020    Social History   Socioeconomic History   Marital status: Single    Spouse name: Not on file   Number of children: 0   Years of education: 14   Highest education level: Not on file  Occupational History   Not on file  Tobacco Use   Smoking status: Some Days    Current packs/day: 0.25    Average packs/day: 0.3 packs/day for 48.8 years (12.2 ttl pk-yrs)    Types: Cigarettes    Start date: 1977    Passive exposure: Current   Smokeless tobacco: Never   Tobacco comments:    Pt states she smokes 2 cigs a day and sometime 4 a week    01/27/2023 patient states she smoke 3 cigarettes on the days that she does smoke  Vaping Use   Vaping status: Never Used  Substance and Sexual Activity   Alcohol use: Not Currently    Comment: Quit 12/07/2015   Drug use: Never   Sexual activity: Not Currently    Birth control/protection: Post-menopausal  Other Topics Concern   Not on file  Social History Narrative   Lives with sister   Left handed   Drinks 1-2 cups caffeine daily   Social Drivers of Health   Financial Resource Strain: Not on file  Food Insecurity: No Food Insecurity (01/07/2023)   Hunger Vital Sign    Worried About Running Out of Food in the Last Year: Never true    Ran Out of Food in the Last Year: Never true  Transportation Needs: No Transportation Needs (01/07/2023)   PRAPARE - Administrator, Civil Service (Medical): No    Lack of Transportation (Non-Medical): No  Physical Activity: Not on file  Stress: Not on file  Social Connections: Not on file  Intimate Partner Violence: Not At Risk (01/07/2023)   Humiliation, Afraid, Rape, and Kick questionnaire    Fear of Current or Ex-Partner: No    Emotionally Abused: No    Physically Abused: No    Sexually Abused: No     Review of Systems: General: negative for chills, fever, night sweats or weight changes.  Cardiovascular: negative for chest pain, dyspnea on  exertion, edema, orthopnea, palpitations, paroxysmal nocturnal dyspnea or shortness of breath Dermatological: negative for rash Respiratory: negative for cough or wheezing Urologic: negative for hematuria Abdominal: negative for nausea, vomiting, diarrhea, bright red blood per rectum, melena, or hematemesis Neurologic: negative for visual changes, syncope, or dizziness All other systems reviewed and are otherwise negative except as noted above.    Blood pressure (!) 162/84, pulse 74, height 5' 2 (1.575 m), weight 201 lb 9.6 oz (91.4 kg), SpO2 94%.  General appearance: alert and no distress Neck: no adenopathy, no carotid bruit, no JVD, supple, symmetrical, trachea midline, and thyroid  not enlarged, symmetric, no tenderness/mass/nodules  Lungs: clear to auscultation bilaterally Heart: regular rate and rhythm, S1, S2 normal, no murmur, click, rub or gallop Extremities: extremities normal, atraumatic, no cyanosis or edema Pulses: 2+ and symmetric Skin: Skin color, texture, turgor normal. No rashes or lesions Neurologic: Grossly normal  EKG EKG Interpretation Date/Time:  Wednesday March 29 2024 15:20:14 EDT Ventricular Rate:  74 PR Interval:  158 QRS Duration:  166 QT Interval:  466 QTC Calculation: 517 R Axis:   -81  Text Interpretation: AV dual-paced rhythm When compared with ECG of 20-Feb-2024 08:23, PREVIOUS ECG IS PRESENT Confirmed by Court Carrier [phone]) on 03/29/2024 3:28:15 PM    ASSESSMENT AND PLAN:   PAD (peripheral artery disease) History of PAD with left lower extremity claudication status post left external iliac artery PTA and stenting by myself 01/07/2023 with an 8 mm x 40 mm long absolute Pro nitinol self-expanding stent with an excellent result.  Follow-up Dopplers normalized and her claudication resolved.  Her most recent Dopplers performed/22/25 revealed ABIs in the 0.9 range bilaterally with a patent left external iliac artery stent.  Will continue to follow this  noninvasively on an annual basis.     Carrier DOROTHA Court MD FACP,FACC,FAHA, [facility] 03/29/2024 3:36 PM

## 2024-03-29 NOTE — Assessment & Plan Note (Signed)
 History of PAD with left lower extremity claudication status post left external iliac artery PTA and stenting by myself 01/07/2023 with an 8 mm x 40 mm long absolute Pro nitinol self-expanding stent with an excellent result.  Follow-up Dopplers normalized and her claudication resolved.  Her most recent Dopplers performed/22/25 revealed ABIs in the 0.9 range bilaterally with a patent left external iliac artery stent.  Will continue to follow this noninvasively on an annual basis.

## 2024-03-30 ENCOUNTER — Ambulatory Visit (INDEPENDENT_AMBULATORY_CARE_PROVIDER_SITE_OTHER): Payer: Medicare HMO

## 2024-03-30 DIAGNOSIS — I428 Other cardiomyopathies: Secondary | ICD-10-CM

## 2024-03-31 LAB — CUP PACEART REMOTE DEVICE CHECK
Battery Remaining Longevity: 26 mo
Battery Voltage: 2.92 V
Brady Statistic AP VP Percent: 43 %
Brady Statistic AP VS Percent: 0 %
Brady Statistic AS VP Percent: 57 %
Brady Statistic AS VS Percent: 0 %
Brady Statistic RA Percent Paced: 42.98 %
Brady Statistic RV Percent Paced: 100 %
Date Time Interrogation Session: 20251023181641
Implantable Lead Connection Status: 753985
Implantable Lead Connection Status: 753985
Implantable Lead Implant Date: 20160707
Implantable Lead Implant Date: 20160707
Implantable Lead Location: 753859
Implantable Lead Location: 753860
Implantable Lead Model: 5076
Implantable Lead Model: 5076
Implantable Pulse Generator Implant Date: 20160707
Lead Channel Impedance Value: 342 Ohm
Lead Channel Impedance Value: 361 Ohm
Lead Channel Impedance Value: 703 Ohm
Lead Channel Impedance Value: 722 Ohm
Lead Channel Pacing Threshold Amplitude: 0.5 V
Lead Channel Pacing Threshold Amplitude: 0.5 V
Lead Channel Pacing Threshold Pulse Width: 0.4 ms
Lead Channel Pacing Threshold Pulse Width: 0.4 ms
Lead Channel Sensing Intrinsic Amplitude: 27.5 mV
Lead Channel Sensing Intrinsic Amplitude: 27.5 mV
Lead Channel Sensing Intrinsic Amplitude: 3.875 mV
Lead Channel Sensing Intrinsic Amplitude: 3.875 mV
Lead Channel Setting Pacing Amplitude: 1.5 V
Lead Channel Setting Pacing Amplitude: 2.5 V
Lead Channel Setting Pacing Pulse Width: 0.4 ms
Lead Channel Setting Sensing Sensitivity: 1.2 mV
Zone Setting Status: 755011
Zone Setting Status: 755011

## 2024-04-03 ENCOUNTER — Ambulatory Visit: Payer: Self-pay | Admitting: Cardiology

## 2024-04-03 NOTE — Progress Notes (Signed)
 Remote PPM Transmission

## 2024-04-06 DIAGNOSIS — M25551 Pain in right hip: Secondary | ICD-10-CM | POA: Diagnosis not present

## 2024-06-16 ENCOUNTER — Other Ambulatory Visit: Payer: Self-pay | Admitting: Cardiovascular Disease

## 2024-06-16 ENCOUNTER — Other Ambulatory Visit: Payer: Self-pay | Admitting: Physician Assistant

## 2024-06-29 ENCOUNTER — Ambulatory Visit: Attending: Student in an Organized Health Care Education/Training Program

## 2024-06-29 DIAGNOSIS — I428 Other cardiomyopathies: Secondary | ICD-10-CM

## 2024-07-03 LAB — CUP PACEART REMOTE DEVICE CHECK
Battery Remaining Longevity: 20 mo
Battery Voltage: 2.91 V
Brady Statistic AP VP Percent: 48.03 %
Brady Statistic AP VS Percent: 0 %
Brady Statistic AS VP Percent: 51.97 %
Brady Statistic AS VS Percent: 0 %
Brady Statistic RA Percent Paced: 48.01 %
Brady Statistic RV Percent Paced: 100 %
Date Time Interrogation Session: 20260123133554
Implantable Lead Connection Status: 753985
Implantable Lead Connection Status: 753985
Implantable Lead Implant Date: 20160707
Implantable Lead Implant Date: 20160707
Implantable Lead Location: 753859
Implantable Lead Location: 753860
Implantable Lead Model: 5076
Implantable Lead Model: 5076
Implantable Pulse Generator Implant Date: 20160707
Lead Channel Impedance Value: 380 Ohm
Lead Channel Impedance Value: 380 Ohm
Lead Channel Impedance Value: 779 Ohm
Lead Channel Impedance Value: 798 Ohm
Lead Channel Pacing Threshold Amplitude: 0.5 V
Lead Channel Pacing Threshold Amplitude: 0.5 V
Lead Channel Pacing Threshold Pulse Width: 0.4 ms
Lead Channel Pacing Threshold Pulse Width: 0.4 ms
Lead Channel Sensing Intrinsic Amplitude: 27.5 mV
Lead Channel Sensing Intrinsic Amplitude: 27.5 mV
Lead Channel Sensing Intrinsic Amplitude: 3 mV
Lead Channel Sensing Intrinsic Amplitude: 3 mV
Lead Channel Setting Pacing Amplitude: 1.5 V
Lead Channel Setting Pacing Amplitude: 2.5 V
Lead Channel Setting Pacing Pulse Width: 0.4 ms
Lead Channel Setting Sensing Sensitivity: 1.2 mV
Zone Setting Status: 755011
Zone Setting Status: 755011

## 2024-07-03 NOTE — Progress Notes (Signed)
 Remote PPM Transmission

## 2024-07-09 ENCOUNTER — Ambulatory Visit: Payer: Self-pay | Admitting: Student in an Organized Health Care Education/Training Program

## 2024-08-29 ENCOUNTER — Ambulatory Visit: Admitting: Student in an Organized Health Care Education/Training Program

## 2024-09-21 ENCOUNTER — Ambulatory Visit: Admitting: Neurology

## 2024-09-25 ENCOUNTER — Ambulatory Visit (HOSPITAL_COMMUNITY)

## 2024-09-28 ENCOUNTER — Ambulatory Visit

## 2024-12-28 ENCOUNTER — Ambulatory Visit

## 2025-03-29 ENCOUNTER — Ambulatory Visit

## 2025-06-28 ENCOUNTER — Ambulatory Visit
